# Patient Record
Sex: Female | Born: 1980 | Race: White | Hispanic: No | Marital: Married | Smoking: Former smoker
Health system: Southern US, Community
[De-identification: ages and names within clinical notes are randomized; demographics above are authoritative.]

## PROBLEM LIST (undated history)

## (undated) DIAGNOSIS — Z8619 Personal history of other infectious and parasitic diseases: Secondary | ICD-10-CM

## (undated) DIAGNOSIS — Z87442 Personal history of urinary calculi: Secondary | ICD-10-CM

## (undated) DIAGNOSIS — J45990 Exercise induced bronchospasm: Secondary | ICD-10-CM

## (undated) DIAGNOSIS — F988 Other specified behavioral and emotional disorders with onset usually occurring in childhood and adolescence: Secondary | ICD-10-CM

## (undated) DIAGNOSIS — I639 Cerebral infarction, unspecified: Secondary | ICD-10-CM

## (undated) DIAGNOSIS — Z3490 Encounter for supervision of normal pregnancy, unspecified, unspecified trimester: Secondary | ICD-10-CM

## (undated) DIAGNOSIS — Z8659 Personal history of other mental and behavioral disorders: Secondary | ICD-10-CM

## (undated) DIAGNOSIS — Z808 Family history of malignant neoplasm of other organs or systems: Secondary | ICD-10-CM

## (undated) DIAGNOSIS — Z8739 Personal history of other diseases of the musculoskeletal system and connective tissue: Secondary | ICD-10-CM

## (undated) DIAGNOSIS — Z8049 Family history of malignant neoplasm of other genital organs: Secondary | ICD-10-CM

## (undated) DIAGNOSIS — Z803 Family history of malignant neoplasm of breast: Secondary | ICD-10-CM

## (undated) DIAGNOSIS — F909 Attention-deficit hyperactivity disorder, unspecified type: Secondary | ICD-10-CM

## (undated) DIAGNOSIS — I69393 Ataxia following cerebral infarction: Secondary | ICD-10-CM

## (undated) DIAGNOSIS — R42 Dizziness and giddiness: Secondary | ICD-10-CM

## (undated) DIAGNOSIS — N200 Calculus of kidney: Secondary | ICD-10-CM

## (undated) DIAGNOSIS — F329 Major depressive disorder, single episode, unspecified: Secondary | ICD-10-CM

## (undated) DIAGNOSIS — Z98891 History of uterine scar from previous surgery: Secondary | ICD-10-CM

## (undated) DIAGNOSIS — Z9851 Tubal ligation status: Secondary | ICD-10-CM

## (undated) DIAGNOSIS — Z8052 Family history of malignant neoplasm of bladder: Secondary | ICD-10-CM

## (undated) HISTORY — DX: Family history of malignant neoplasm of breast: Z80.3

## (undated) HISTORY — PX: CARPAL TUNNEL RELEASE: SHX101

## (undated) HISTORY — DX: Exercise induced bronchospasm: J45.990

## (undated) HISTORY — PX: LAPAROSCOPIC CHOLECYSTECTOMY: SUR755

## (undated) HISTORY — PX: CYSTOSCOPY/URETEROSCOPY/HOLMIUM LASER/STENT PLACEMENT: SHX6546

## (undated) HISTORY — PX: CHOLECYSTECTOMY: SHX55

## (undated) HISTORY — PX: HAND SURGERY: SHX662

## (undated) HISTORY — PX: HERNIA REPAIR: SHX51

## (undated) HISTORY — PX: SPINAL FIXATION SURGERY W/ IMPLANT: SHX785

## (undated) HISTORY — PX: INGUINAL HERNIA REPAIR: SUR1180

## (undated) HISTORY — DX: Calculus of kidney: N20.0

## (undated) HISTORY — PX: KIDNEY STONE SURGERY: SHX686

## (undated) HISTORY — DX: Family history of malignant neoplasm of other organs or systems: Z80.8

## (undated) HISTORY — PX: BACK SURGERY: SHX140

## (undated) HISTORY — DX: Family history of malignant neoplasm of other genital organs: Z80.49

## (undated) HISTORY — DX: Other specified behavioral and emotional disorders with onset usually occurring in childhood and adolescence: F98.8

## (undated) HISTORY — DX: Cerebral infarction, unspecified: I63.9

## (undated) HISTORY — DX: Family history of malignant neoplasm of bladder: Z80.52

---
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
## 2002-11-20 ENCOUNTER — Other Ambulatory Visit: Admission: RE | Admit: 2002-11-20 | Discharge: 2002-11-20 | Payer: Self-pay | Admitting: Obstetrics and Gynecology

## 2003-06-05 ENCOUNTER — Other Ambulatory Visit: Admission: RE | Admit: 2003-06-05 | Discharge: 2003-06-05 | Payer: Self-pay | Admitting: Obstetrics and Gynecology

## 2003-12-08 ENCOUNTER — Inpatient Hospital Stay (HOSPITAL_COMMUNITY): Admission: AD | Admit: 2003-12-08 | Discharge: 2003-12-08 | Payer: Self-pay | Admitting: Obstetrics & Gynecology

## 2003-12-16 ENCOUNTER — Inpatient Hospital Stay (HOSPITAL_COMMUNITY): Admission: AD | Admit: 2003-12-16 | Discharge: 2003-12-20 | Payer: Self-pay | Admitting: *Deleted

## 2003-12-21 ENCOUNTER — Encounter: Admission: RE | Admit: 2003-12-21 | Discharge: 2004-01-20 | Payer: Self-pay | Admitting: Obstetrics and Gynecology

## 2004-01-24 ENCOUNTER — Other Ambulatory Visit: Admission: RE | Admit: 2004-01-24 | Discharge: 2004-01-24 | Payer: Self-pay | Admitting: Obstetrics and Gynecology

## 2006-09-11 ENCOUNTER — Inpatient Hospital Stay (HOSPITAL_COMMUNITY): Admission: AD | Admit: 2006-09-11 | Discharge: 2006-09-14 | Payer: Self-pay | Admitting: Obstetrics and Gynecology

## 2006-09-12 ENCOUNTER — Encounter (INDEPENDENT_AMBULATORY_CARE_PROVIDER_SITE_OTHER): Payer: Self-pay | Admitting: Specialist

## 2006-09-15 ENCOUNTER — Encounter: Admission: RE | Admit: 2006-09-15 | Discharge: 2006-10-15 | Payer: Self-pay | Admitting: Obstetrics and Gynecology

## 2006-09-26 ENCOUNTER — Ambulatory Visit: Payer: Self-pay | Admitting: Family Medicine

## 2007-04-21 ENCOUNTER — Ambulatory Visit: Payer: Self-pay | Admitting: Family Medicine

## 2007-04-24 ENCOUNTER — Encounter: Admission: RE | Admit: 2007-04-24 | Discharge: 2007-04-24 | Payer: Self-pay | Admitting: Family Medicine

## 2007-05-01 ENCOUNTER — Ambulatory Visit: Payer: Self-pay | Admitting: Family Medicine

## 2007-07-26 ENCOUNTER — Ambulatory Visit: Payer: Self-pay | Admitting: Family Medicine

## 2007-07-28 ENCOUNTER — Emergency Department (HOSPITAL_COMMUNITY): Admission: EM | Admit: 2007-07-28 | Discharge: 2007-07-28 | Payer: Self-pay | Admitting: Emergency Medicine

## 2007-08-01 ENCOUNTER — Ambulatory Visit (HOSPITAL_BASED_OUTPATIENT_CLINIC_OR_DEPARTMENT_OTHER): Admission: RE | Admit: 2007-08-01 | Discharge: 2007-08-01 | Payer: Self-pay | Admitting: Urology

## 2007-08-23 ENCOUNTER — Emergency Department (HOSPITAL_COMMUNITY): Admission: EM | Admit: 2007-08-23 | Discharge: 2007-08-23 | Payer: Self-pay | Admitting: Emergency Medicine

## 2007-10-23 ENCOUNTER — Ambulatory Visit (HOSPITAL_COMMUNITY): Admission: RE | Admit: 2007-10-23 | Discharge: 2007-10-23 | Payer: Self-pay | Admitting: Urology

## 2008-04-22 ENCOUNTER — Ambulatory Visit: Payer: Self-pay | Admitting: Family Medicine

## 2008-10-15 ENCOUNTER — Inpatient Hospital Stay (HOSPITAL_COMMUNITY): Admission: AD | Admit: 2008-10-15 | Discharge: 2008-10-15 | Payer: Self-pay | Admitting: Obstetrics & Gynecology

## 2008-12-02 ENCOUNTER — Ambulatory Visit: Payer: Self-pay | Admitting: Family Medicine

## 2009-01-06 ENCOUNTER — Inpatient Hospital Stay (HOSPITAL_COMMUNITY): Admission: RE | Admit: 2009-01-06 | Discharge: 2009-01-09 | Payer: Self-pay | Admitting: Obstetrics and Gynecology

## 2009-01-10 ENCOUNTER — Encounter: Admission: RE | Admit: 2009-01-10 | Discharge: 2009-02-06 | Payer: Self-pay | Admitting: Obstetrics and Gynecology

## 2009-02-05 ENCOUNTER — Ambulatory Visit: Payer: Self-pay | Admitting: Family Medicine

## 2009-02-07 ENCOUNTER — Encounter: Admission: RE | Admit: 2009-02-07 | Discharge: 2009-02-20 | Payer: Self-pay | Admitting: Obstetrics and Gynecology

## 2009-05-07 ENCOUNTER — Ambulatory Visit: Payer: Self-pay | Admitting: Family Medicine

## 2009-05-08 ENCOUNTER — Encounter: Admission: RE | Admit: 2009-05-08 | Discharge: 2009-05-08 | Payer: Self-pay | Admitting: Family Medicine

## 2009-05-08 ENCOUNTER — Ambulatory Visit: Payer: Self-pay | Admitting: Family Medicine

## 2009-05-20 ENCOUNTER — Ambulatory Visit: Payer: Self-pay | Admitting: Family Medicine

## 2009-06-03 ENCOUNTER — Ambulatory Visit: Payer: Self-pay | Admitting: Family Medicine

## 2009-06-30 ENCOUNTER — Ambulatory Visit: Payer: Self-pay | Admitting: Family Medicine

## 2009-07-15 ENCOUNTER — Ambulatory Visit (HOSPITAL_BASED_OUTPATIENT_CLINIC_OR_DEPARTMENT_OTHER): Admission: RE | Admit: 2009-07-15 | Discharge: 2009-07-15 | Payer: Self-pay | Admitting: General Surgery

## 2009-08-04 ENCOUNTER — Ambulatory Visit: Payer: Self-pay | Admitting: Family Medicine

## 2009-08-07 ENCOUNTER — Encounter: Admission: RE | Admit: 2009-08-07 | Discharge: 2009-08-07 | Payer: Self-pay | Admitting: Family Medicine

## 2009-08-15 ENCOUNTER — Ambulatory Visit (HOSPITAL_COMMUNITY): Admission: RE | Admit: 2009-08-15 | Discharge: 2009-08-15 | Payer: Self-pay | Admitting: Surgery

## 2009-08-20 ENCOUNTER — Encounter (INDEPENDENT_AMBULATORY_CARE_PROVIDER_SITE_OTHER): Payer: Self-pay | Admitting: General Surgery

## 2009-08-20 ENCOUNTER — Ambulatory Visit (HOSPITAL_COMMUNITY): Admission: RE | Admit: 2009-08-20 | Discharge: 2009-08-21 | Payer: Self-pay | Admitting: General Surgery

## 2009-08-30 ENCOUNTER — Inpatient Hospital Stay (HOSPITAL_COMMUNITY): Admission: AD | Admit: 2009-08-30 | Discharge: 2009-08-31 | Payer: Self-pay | Admitting: General Surgery

## 2009-09-18 ENCOUNTER — Ambulatory Visit: Payer: Self-pay | Admitting: Family Medicine

## 2009-09-29 ENCOUNTER — Ambulatory Visit: Payer: Self-pay | Admitting: Family Medicine

## 2009-10-20 ENCOUNTER — Ambulatory Visit: Payer: Self-pay | Admitting: Family Medicine

## 2009-10-28 ENCOUNTER — Ambulatory Visit: Payer: Self-pay | Admitting: Physician Assistant

## 2009-11-11 ENCOUNTER — Ambulatory Visit: Payer: Self-pay | Admitting: Family Medicine

## 2009-11-27 ENCOUNTER — Encounter: Admission: RE | Admit: 2009-11-27 | Discharge: 2009-11-27 | Payer: Self-pay | Admitting: Orthopaedic Surgery

## 2009-12-11 ENCOUNTER — Ambulatory Visit: Payer: Self-pay | Admitting: Family Medicine

## 2009-12-22 ENCOUNTER — Ambulatory Visit: Payer: Self-pay | Admitting: Family Medicine

## 2010-01-02 ENCOUNTER — Encounter: Admission: RE | Admit: 2010-01-02 | Discharge: 2010-01-02 | Payer: Self-pay | Admitting: Sports Medicine

## 2010-01-13 ENCOUNTER — Ambulatory Visit: Payer: Self-pay | Admitting: Family Medicine

## 2010-02-12 ENCOUNTER — Ambulatory Visit: Payer: Self-pay | Admitting: Family Medicine

## 2010-04-15 ENCOUNTER — Ambulatory Visit: Payer: Self-pay | Admitting: Family Medicine

## 2010-05-13 ENCOUNTER — Ambulatory Visit: Payer: Self-pay | Admitting: Physician Assistant

## 2010-05-15 ENCOUNTER — Ambulatory Visit: Payer: Self-pay | Admitting: Family Medicine

## 2010-05-21 ENCOUNTER — Ambulatory Visit: Payer: Self-pay | Admitting: Family Medicine

## 2010-06-16 ENCOUNTER — Ambulatory Visit: Payer: Self-pay | Admitting: Family Medicine

## 2010-07-31 ENCOUNTER — Ambulatory Visit: Payer: Self-pay | Admitting: Family Medicine

## 2010-07-31 ENCOUNTER — Encounter: Admission: RE | Admit: 2010-07-31 | Discharge: 2010-07-31 | Payer: Self-pay | Admitting: Family Medicine

## 2010-08-05 ENCOUNTER — Ambulatory Visit: Payer: Self-pay | Admitting: Family Medicine

## 2010-10-08 ENCOUNTER — Ambulatory Visit: Payer: Self-pay | Admitting: Family Medicine

## 2010-11-27 ENCOUNTER — Ambulatory Visit
Admission: RE | Admit: 2010-11-27 | Discharge: 2010-11-27 | Payer: Self-pay | Source: Home / Self Care | Attending: Family Medicine | Admitting: Family Medicine

## 2010-12-12 ENCOUNTER — Encounter: Payer: Self-pay | Admitting: Obstetrics and Gynecology

## 2010-12-13 ENCOUNTER — Encounter: Payer: Self-pay | Admitting: General Surgery

## 2010-12-14 ENCOUNTER — Encounter: Payer: Self-pay | Admitting: Family Medicine

## 2011-01-04 ENCOUNTER — Encounter (INDEPENDENT_AMBULATORY_CARE_PROVIDER_SITE_OTHER): Payer: 59 | Admitting: Family Medicine

## 2011-01-04 DIAGNOSIS — K5900 Constipation, unspecified: Secondary | ICD-10-CM

## 2011-01-04 DIAGNOSIS — J45909 Unspecified asthma, uncomplicated: Secondary | ICD-10-CM

## 2011-01-04 DIAGNOSIS — Z79899 Other long term (current) drug therapy: Secondary | ICD-10-CM

## 2011-01-04 DIAGNOSIS — F988 Other specified behavioral and emotional disorders with onset usually occurring in childhood and adolescence: Secondary | ICD-10-CM

## 2011-01-20 ENCOUNTER — Ambulatory Visit (INDEPENDENT_AMBULATORY_CARE_PROVIDER_SITE_OTHER): Payer: 59 | Admitting: Family Medicine

## 2011-01-20 DIAGNOSIS — G5600 Carpal tunnel syndrome, unspecified upper limb: Secondary | ICD-10-CM

## 2011-01-21 ENCOUNTER — Ambulatory Visit: Payer: 59 | Admitting: Family Medicine

## 2011-02-11 ENCOUNTER — Other Ambulatory Visit: Payer: Self-pay | Admitting: Family Medicine

## 2011-02-11 ENCOUNTER — Institutional Professional Consult (permissible substitution) (INDEPENDENT_AMBULATORY_CARE_PROVIDER_SITE_OTHER): Payer: 59 | Admitting: Family Medicine

## 2011-02-11 DIAGNOSIS — N949 Unspecified condition associated with female genital organs and menstrual cycle: Secondary | ICD-10-CM

## 2011-02-11 DIAGNOSIS — R102 Pelvic and perineal pain: Secondary | ICD-10-CM

## 2011-02-11 DIAGNOSIS — N760 Acute vaginitis: Secondary | ICD-10-CM

## 2011-02-12 ENCOUNTER — Ambulatory Visit
Admission: RE | Admit: 2011-02-12 | Discharge: 2011-02-12 | Disposition: A | Discharge status: Home / Self Care | Payer: 59 | Source: Ambulatory Visit | Attending: Family Medicine | Admitting: Family Medicine

## 2011-02-12 DIAGNOSIS — R102 Pelvic and perineal pain: Secondary | ICD-10-CM

## 2011-02-25 LAB — COMPREHENSIVE METABOLIC PANEL
ALT: 130 U/L — ABNORMAL HIGH (ref 0–35)
AST: 47 U/L — ABNORMAL HIGH (ref 0–37)
Albumin: 4 g/dL (ref 3.5–5.2)
Alkaline Phosphatase: 109 U/L (ref 39–117)
BUN: 15 mg/dL (ref 6–23)
CO2: 24 mEq/L (ref 19–32)
Calcium: 9.8 mg/dL (ref 8.4–10.5)
Chloride: 106 mEq/L (ref 96–112)
Creatinine, Ser: 0.63 mg/dL (ref 0.4–1.2)
GFR calc Af Amer: >60 mL/min (ref >60)
GFR calc non Af Amer: >60 mL/min (ref >60)
Glucose, Bld: 97 mg/dL (ref 70–99)
Potassium: 4.1 mEq/L (ref 3.5–5.1)
Sodium: 137 mEq/L (ref 135–145)
Total Bilirubin: 0.4 mg/dL (ref 0.3–1.2)
Total Protein: 7.3 g/dL (ref 6.0–8.3)

## 2011-02-25 LAB — DIFFERENTIAL
Basophils Absolute: 0.1 10*3/uL (ref 0.0–0.1)
Basophils Relative: 1 % (ref 0–1)
Eosinophils Absolute: 0.1 10*3/uL (ref 0.0–0.7)
Eosinophils Relative: 2 % (ref 0–5)
Lymphocytes Relative: 31 % (ref 12–46)
Lymphs Abs: 3 10*3/uL (ref 0.7–4.0)
Monocytes Absolute: 0.6 10*3/uL (ref 0.1–1.0)
Monocytes Relative: 6 % (ref 3–12)
Neutro Abs: 5.8 10*3/uL (ref 1.7–7.7)
Neutrophils Relative %: 60 % (ref 43–77)

## 2011-02-25 LAB — CBC
HCT: 41.6 % (ref 36.0–46.0)
Hemoglobin: 14 g/dL (ref 12.0–15.0)
MCHC: 33.7 g/dL (ref 30.0–36.0)
MCV: 93.5 fL (ref 78.0–100.0)
Platelets: 321 10*3/uL (ref 150–400)
RBC: 4.46 MIL/uL (ref 3.87–5.11)
RDW: 13.3 % (ref 11.5–15.5)
WBC: 9.6 10*3/uL (ref 4.0–10.5)

## 2011-02-25 LAB — AMYLASE: Amylase: 52 U/L (ref 27–131)

## 2011-02-26 LAB — CBC
HCT: 41.7 % (ref 36.0–46.0)
Hemoglobin: 14 g/dL (ref 12.0–15.0)
MCHC: 33.5 g/dL (ref 30.0–36.0)
MCV: 93.7 fL (ref 78.0–100.0)
Platelets: 288 10*3/uL (ref 150–400)
RBC: 4.46 MIL/uL (ref 3.87–5.11)
RDW: 14 % (ref 11.5–15.5)
WBC: 8.9 10*3/uL (ref 4.0–10.5)

## 2011-02-26 LAB — COMPREHENSIVE METABOLIC PANEL
ALT: 11 U/L (ref 0–35)
AST: 20 U/L (ref 0–37)
Albumin: 4.4 g/dL (ref 3.5–5.2)
Alkaline Phosphatase: 58 U/L (ref 39–117)
BUN: 10 mg/dL (ref 6–23)
CO2: 25 mEq/L (ref 19–32)
Calcium: 9.4 mg/dL (ref 8.4–10.5)
Chloride: 108 mEq/L (ref 96–112)
Creatinine, Ser: 0.63 mg/dL (ref 0.4–1.2)
GFR calc Af Amer: >60 mL/min (ref >60)
GFR calc non Af Amer: >60 mL/min (ref >60)
Glucose, Bld: 84 mg/dL (ref 70–99)
Potassium: 3.9 mEq/L (ref 3.5–5.1)
Sodium: 138 mEq/L (ref 135–145)
Total Bilirubin: 0.5 mg/dL (ref 0.3–1.2)
Total Protein: 7.6 g/dL (ref 6.0–8.3)

## 2011-02-26 LAB — DIFFERENTIAL
Basophils Absolute: 0 10*3/uL (ref 0.0–0.1)
Basophils Relative: 0 % (ref 0–1)
Eosinophils Absolute: 0.1 10*3/uL (ref 0.0–0.7)
Eosinophils Relative: 1 % (ref 0–5)
Lymphocytes Relative: 41 % (ref 12–46)
Lymphs Abs: 3.6 10*3/uL (ref 0.7–4.0)
Monocytes Absolute: 0.5 10*3/uL (ref 0.1–1.0)
Monocytes Relative: 5 % (ref 3–12)
Neutro Abs: 4.8 10*3/uL (ref 1.7–7.7)
Neutrophils Relative %: 53 % (ref 43–77)

## 2011-02-26 LAB — PREGNANCY, URINE: Preg Test, Ur: NEGATIVE

## 2011-02-27 LAB — POCT I-STAT, CHEM 8
BUN: 10 mg/dL (ref 6–23)
Calcium, Ion: 1.21 mmol/L (ref 1.12–1.32)
Chloride: 109 mEq/L (ref 96–112)
Creatinine, Ser: 0.6 mg/dL (ref 0.4–1.2)
Glucose, Bld: 82 mg/dL (ref 70–99)
HCT: 44 % (ref 36.0–46.0)
Hemoglobin: 15 g/dL (ref 12.0–15.0)
Potassium: 4.2 mEq/L (ref 3.5–5.1)
Sodium: 141 mEq/L (ref 135–145)
TCO2: 21 mmol/L (ref 0–100)

## 2011-02-27 LAB — CBC
HCT: 41.5 % (ref 36.0–46.0)
Hemoglobin: 13.9 g/dL (ref 12.0–15.0)
MCHC: 33.5 g/dL (ref 30.0–36.0)
MCV: 93.6 fL (ref 78.0–100.0)
Platelets: 268 10*3/uL (ref 150–400)
RBC: 4.44 MIL/uL (ref 3.87–5.11)
RDW: 13.8 % (ref 11.5–15.5)
WBC: 7.9 10*3/uL (ref 4.0–10.5)

## 2011-02-27 LAB — URINALYSIS, ROUTINE W REFLEX MICROSCOPIC
Bilirubin Urine: NEGATIVE
Glucose, UA: NEGATIVE mg/dL
Hgb urine dipstick: NEGATIVE
Ketones, ur: NEGATIVE mg/dL
Nitrite: NEGATIVE
Protein, ur: NEGATIVE mg/dL
Specific Gravity, Urine: 1.021 (ref 1.005–1.030)
Urobilinogen, UA: 0.2 mg/dL (ref 0.0–1.0)
pH: 6.5 (ref 5.0–8.0)

## 2011-02-27 LAB — DIFFERENTIAL
Basophils Absolute: 0 10*3/uL (ref 0.0–0.1)
Basophils Relative: 0 % (ref 0–1)
Eosinophils Absolute: 0.1 10*3/uL (ref 0.0–0.7)
Eosinophils Relative: 2 % (ref 0–5)
Lymphocytes Relative: 32 % (ref 12–46)
Lymphs Abs: 2.5 10*3/uL (ref 0.7–4.0)
Monocytes Absolute: 0.5 10*3/uL (ref 0.1–1.0)
Monocytes Relative: 7 % (ref 3–12)
Neutro Abs: 4.7 10*3/uL (ref 1.7–7.7)
Neutrophils Relative %: 59 % (ref 43–77)

## 2011-02-27 LAB — PREGNANCY, URINE: Preg Test, Ur: NEGATIVE

## 2011-03-09 LAB — CBC
HCT: 39.9 % (ref 36.0–46.0)
HCT: 33 % — ABNORMAL LOW (ref 36.0–46.0)
Hemoglobin: 13.5 g/dL (ref 12.0–15.0)
Hemoglobin: 11.2 g/dL — ABNORMAL LOW (ref 12.0–15.0)
MCHC: 33.8 g/dL (ref 30.0–36.0)
MCHC: 34.1 g/dL (ref 30.0–36.0)
MCV: 94.3 fL (ref 78.0–100.0)
MCV: 94.6 fL (ref 78.0–100.0)
Platelets: 198 10*3/uL (ref 150–400)
Platelets: 149 10*3/uL — ABNORMAL LOW (ref 150–400)
RBC: 4.23 MIL/uL (ref 3.87–5.11)
RBC: 3.49 MIL/uL — ABNORMAL LOW (ref 3.87–5.11)
RDW: 14.6 % (ref 11.5–15.5)
RDW: 14.6 % (ref 11.5–15.5)
WBC: 13.9 10*3/uL — ABNORMAL HIGH (ref 4.0–10.5)
WBC: 17.2 10*3/uL — ABNORMAL HIGH (ref 4.0–10.5)

## 2011-03-09 LAB — CCBB MATERNAL DONOR DRAW

## 2011-03-09 LAB — RPR: RPR Ser Ql: NONREACTIVE

## 2011-04-06 NOTE — Op Note (Signed)
[name], [name]                ACCOUNT NO.:  [id number]   MEDICAL RECORD NO.:  [id number]          PATIENT TYPE:  INP   LOCATION:  9128                          FACILITY:  [facility]   PHYSICIAN:  Maxie Better, M.D.DATE OF BIRTH:  08-Jan-1981   DATE OF PROCEDURE:  01/06/2009  DATE OF DISCHARGE:                               OPERATIVE REPORT   PREOPERATIVE DIAGNOSES:  Previous cesarean section x2, term gestation.   PROCEDURE:  Repeat cesarean section, Kerr hysterotomy.   POSTOPERATIVE DIAGNOSES:  Previous cesarean section x2, term gestation.   ANESTHESIA:  Spinal.   SURGEON:  Maxie Better, MD   ASSISTANT:  Marlinda Mike, CNM   PROCEDURE:  Under adequate spinal anesthesia, the patient was placed in  the supine position with a left lateral tilt.  She was sterilely prepped  and draped in usual fashion.  An indwelling Foley catheter was sterilely  placed.  Marcaine 0.25% was injected along the previous Pfannenstiel  skin incision.  A Pfannenstiel skin incision was then made, carried down  to the rectus fascia.  The rectus fascia was then opened transversely.  At that point, the parietal peritoneum was also entered in the midline  as the rectus muscle was widely separated.  With that in mind and  evidence that there was no injury to the bladder, the remaining parietal  peritoneum was opened after the rectus fascia was then sharply dissected  off the rectus superiorly and inferiorly.  Small amount of bladder  adhesions was encountered.  The vesicouterine peritoneum was opened  transversely.  The bladder was then gently bluntly dissected off the  lower uterine segment.  A curvilinear low transverse uterine incision  was then made and extended with bandage scissors.  Artificial rupture of  membranes was performed.  Clear copious amniotic fluid was noted.  Subsequent delivery of a live female was accomplished.  Baby was bulb  suctioned.  The abdomen and cord was clamped and cut.   The baby was  transferred to the awaiting pediatrician who assigned Apgars of 9 and 9  at 1 and 5 minutes.  The placenta was manually removed.  Uterine cavity  was cleaned of debris.  Uterine incision had no extension.  It was  closed in 2 layers, the first layer with 0 Monocryl running locked  stitch, the second layer was imbricated using 0 Monocryl suture.  Normal  tubes and ovaries were noted bilaterally.  The abdomen was then  copiously irrigated and suctioned of debris with good hemostasis noted.  It was then inspected.  The peritoneum was folded on itself bilaterally  and this was then separated allowing for parietal peritoneum to be  closed with 2-0 Vicryl.  The rectus fascia was closed with 0 Vicryl x2.  The subcutaneous area was irrigated, small bleeders cauterized,  interrupted 2-0 plain sutures placed in the skin approximated using  Ethicon staples.   SPECIMENS:  Placenta not sent to Pathology.   ESTIMATED BLOOD LOSS:  700 mL.   INTRAOPERATIVE FLUID:  1900 mL crystalloid.   URINE OUTPUT:  400 mL clear yellow urine.   Sponge and  instrument counts x2 was correct.  Complication was none.  Weight of the baby was 9 pounds 2 ounces.  The patient tolerated the  procedure well and was transferred to recovery room in stable condition.      Maxie Better, M.D.  Electronically Signed     [place]/MEDQ  D:  01/06/2009  T:  01/07/2009  Job:  [id number]

## 2011-04-06 NOTE — Discharge Summary (Signed)
[name], [name]                ACCOUNT NO.:  [id number]   MEDICAL RECORD NO.:  [id number]          PATIENT TYPE:  INP   LOCATION:  9128                          FACILITY:  [facility]   PHYSICIAN:  Maxie Better, M.D.DATE OF BIRTH:  09/04/1981   DATE OF ADMISSION:  01/06/2009  DATE OF DISCHARGE:  01/09/2009                               DISCHARGE SUMMARY   ADMISSION DIAGNOSES:  Previous cesarean section, term gestation.   DISCHARGE DIAGNOSES:  Term gestation delivered, previous cesarean  section.   PROCEDURE:  Repeat cesarean section.   HISTORY OF PRESENT ILLNESS:  A 27-year gravida 3, para 2-0-0-2 female at  term with previous cesarean section x2 admitted for a repeat cesarean  section.   HOSPITAL COURSE:  The patient was admitted.  She underwent a repeat  cesarean section.  The patient delivered a 9-pound-2 ounce live female.  Apgars of 9 and 9.  Normal tubes and ovaries were noted at the time.  Postoperative course uncomplicated.  Blood type is O+, rubella was  immune.  CBC on postpartum day #1 showed a hemoglobin 11.2, hematocrit  of 33, white count of 17.2, platelet count of 149,000.  By postpartum  day #3, the patient desired to go home.  Her incision had a little bit  of erythema.  The patient's incision was consistent with adhesive  reaction, but otherwise was normal.  She was deemed well to be  discharged home.   DISPOSITION:  Home.   CONDITION:  Stable.   DISCHARGE MEDICATIONS:  1. Percocet 1-2 tablets every 4 hours p.r.n. pain.  2. Motrin 600 mg every 6 hours p.r.n. pain.  3. Prenatal vitamins one p.o. daily.  4. Apply cortisone cream to the area of the abdomen 3-4 times a day as      needed.   DISCHARGE INSTRUCTIONS:  Per the postpartum booklet given, followup  appointment for staple removal on January 13, 2009, and for 6 weeks  postpartum.      Maxie Better, M.D.  Electronically Signed     [place]/MEDQ  D:  02/02/2009  T:  02/03/2009  Job:   [id number]

## 2011-04-06 NOTE — Op Note (Signed)
[name], [name]                ACCOUNT NO.:  [id number]   MEDICAL RECORD NO.:  [id number]          PATIENT TYPE:  AMB   LOCATION:  DSC                          FACILITY:  [facility]   PHYSICIAN:  Almond Lint, MD       DATE OF BIRTH:  05/14/1981   DATE OF PROCEDURE:  07/15/2009  DATE OF DISCHARGE:                               OPERATIVE REPORT   PREOPERATIVE DIAGNOSIS:  Right inguinal hernia.   POSTOPERATIVE DIAGNOSIS:  Right inguinal hernia.   PROCEDURE PERFORMED:  Right inguinal hernia repair with mesh.   SURGEON:  Almond Lint, MD   ASSISTANT:  None.   ANESTHESIA:  General and local.   FINDINGS:  Direct right inguinal hernia.   SPECIMENS:  None.   ESTIMATED BLOOD LOSS:  Minimal.   COMPLICATIONS:  None known.   PROCEDURE:  Steinhaus was identified in the holding area and taken to the  operating room where she was placed supine on the operating room table.  General anesthesia was induced.  Time-out was performed according to the  surgical safety checklist.  Her right groin was shaved, prepped, and  draped in sterile fashion.  When all was correct on the surgical safety  checklist, we continued the operation.  The anterior-superior iliac  spine was identified and marked, and the pubis was marked in the  midline.  An obliquely oriented incision approximately two to 2-1/2  inches in length was made toward the midline.  The incision was  infiltrated with local anesthetic.  The skin was divided with a #15  blade.  The subcutaneous tissue was divided with the Bovie.  There was  some scar tissue as the incision was made with the C-section incision.  Once the external oblique was encountered, this was cleaned off bluntly  with a Kitner.  There were many small crossing veins and these were  coagulated.  The external ring was identified.  A nick was made in the  external oblique fascia with a #15 blade.  The Metzenbaum scissors were  used to elevate the fascia and cut the fascia  inferomedially toward the  external ring, opening it up.  The fascia was also opened laterally.  There was a direct inguinal hernia seen at this point.  The round  ligament was identified and elevated.  There was a small lipoma here as  well as a small hernia sac.  The hernia sac was reduced off the cord,  however, it was very friable.  This was not able to be closed with the  suture.  The round ligament was divided such that I could retract back  into the abdomen.  There was a bridging vein that was tied off.  The  inferior border of the external oblique fascia was cleaned off with a  Kitner.  The Bard 2 x 6 inch mesh was cut to the appropriate size.  A 2-  0 running Prolene was used to secure the inferior border of the mesh to  the inferior shelving edge.  The superior border was taken in  interrupted stitches taking care to cover the  direct hernia.  Also, care  was taken to stretch out the mesh.  The lateral aspect of the mesh was  then trimmed and tucked underneath the external oblique fascia.  The  external oblique fascia was then closed over the mesh with a 2-0 Vicryl.  The area under the fascia was infiltrated with local as well as the  subcutaneous tissue.  The Scarpa fascia was closed with a running 2-0  Vicryl suture.  The skin was reapproximated with 3-0 Vicryl deep dermal  sutures and 4-0 Monocryl running subcuticular suture.  The wound was  cleaned, dried, and dressed with Dermabond.  The patient was awakened  from anesthesia and taken to PACU in stable condition.      Almond Lint, MD  Electronically Signed     FB/MEDQ  D:  07/15/2009  T:  07/16/2009  Job:  [id number]

## 2011-04-06 NOTE — Op Note (Signed)
NAME:  [name], [name]                ACCOUNT NO.:  [id number]   MEDICAL RECORD NO.:  [id number]          PATIENT TYPE:  AMB   LOCATION:  NESC                         FACILITY:  [facility]   PHYSICIAN:  Sigmund I. Patsi Sears, M.D.DATE OF BIRTH:  01/22/1981   DATE OF PROCEDURE:  08/01/2007  DATE OF DISCHARGE:                               OPERATIVE REPORT   PREOPERATIVE DIAGNOSIS:  Distal right ureteral stone.   POSTOPERATIVE DIAGNOSIS:  Distal right ureteral stone.   PROCEDURE:  Cystoscopy, right ureteroscopy with laser lithotripsy and  stone manipulation, right retrograde pyelography with interpretation,  right ureteral stent placement.   SURGEON:  Dr. Jethro Bolus.   ASSISTANT:  Melina Schools.   ANESTHESIA:  General.   DRAINS:  6-French 26 double-J ureteral stent, right ureter.   SPECIMENS:  Stone fragments.   FINDINGS:  Right retrograde pyelogram showing significant right-sided  hydro with a good repair redundant tortuous ureter.  Distal ureteral  edema with areas of patchy ischemic appearing ureter.   INDICATIONS FOR PROCEDURE:  [name] is a 30 year old female who was  evaluated for significant right-sided colic.  Helical CT scan of the  abdomen and pelvis without contrast revealed an impressive 1-cm distal  stone with significant hydroureter nephrosis.  This is the patient's  first stone event.  Due to the size and her symptoms, as well as the  location of the stone, ureteroscopy was selected as her definitive stone  management.   DESCRIPTION OF PROCEDURE IN DETAIL:  The patient was brought to the  operating room. She was identified by arm band. Informed consent was  verified, and preoperative time-out was performed.  After the successful  induction of general anesthesia with laryngeal mask airway, the patient  was moved to the dorsal lithotomy position.  Perioperative antibiotics  were administered.  All appropriate pressure points were padded to avoid  neurapraxia  compartment syndrome.  Sequential compression devices were  employed.  The perineum was prepped and draped in usual fashion.  A 22-  French cystoscopic sheath was used to introduce a 12-degree cystoscopic  lens transurethrally in the bladder.  Pancystourethroscopy was  performed.  There was some scattered squamous metaplasia along the  trigone.  The left ureteral orifice was noted to be normal in anatomic  position along a trigone and was seen to freely efflux urine.  The right  ureteral orifice was noted in normal anatomic position, but no efflux of  urine was noted.  The remainder of the bladder was inspected and was  free of any erythema, papillary lesions, foreign bodies, diverticula,  stones.  Attention was turned to the right ureteral orifice.  It was  cannulated with a 6-French end-hole catheter.  An angled 0.38 Glidewire  was inserted and advanced into the presumptive renal pelvis via the  Seldinger technique.  The end-hole catheter and the cystoscope were  removed, and a 6-French short semi-rigid ureteroscope was driven under  direct vision into the distal ureter.  The intramural or intramural  ureter was normal.  Upon entering the distal ureter, there was marked  edema consistent with stone impaction.  A 1-cm stone was visualized,  both with fluoroscopy and under direct vision.  At this time, a 270  micron holmium laser fiber was inserted via the working part of the  scope.  Systematic holmium laser lithotripsy was undertaken.  With  settings of 0.5 joules at 5 Hz for a total power of 2.5 watts, the stone  was systematically fragmented into 7-mm pieces.  Once this was done, the  scope was driven into the mid ureter.  No additional areas of stone  disease were seen.  There was markedly abnormal distal ureter with areas  that appeared white and possibly ischemic.  At this time, a nitinol  basket was used to retrieve 2 fragments that were greater than 1 mm.  The semi-rigid scope was  then drive to the level of the ureteropelvic  junction, and then the ureter was inspected in a retrograde fashion.  No  additional stones were noted.  The semirigid scope was removed.  A 6-  French end-hole catheter was advanced by the Seldinger technique over  the wire into the mid ureter.  Contrast was injected, and right  retrograde pyelogram was performed.   Right retrograde pyelogram revealed a markedly dilated ureter with  severe hydronephrosis.  The ureter was tortuous particularly at the  upper aspect.  No additional filling defects were seen.   At this time, a sensor wire was advanced into the renal pelvis under  fluoroscopic control.  Because of the tortuosity of the ureter, this was  technically challenging and required the patient to be in steep  Trendelenburg and significant manipulation with both an end-hole  catheter and an angiographic catheter.  Once the wire was constantly  seen within the renal pelvis, the cystoscope was back loaded with a  wire, and a 6 x 26 double-J ureteral stent was advanced into the renal  pelvis under fluoroscopic control.  Once seen within the renal pelvis,  the wire was removed.  Excellent proximal and distal curls were seen  under fluoroscopy, and the distal curl was confirmed under direct  vision.  The bladder was drained.  The stone fragments were retrieved  and sent with the patient.  At this time, the procedure was terminated.  The patient tolerated the procedure well, and there were no  complications.  Sigmund Patsi Sears was the primary responsible physician  and was present and participated in all aspects of procedure.   DISPOSITION:  The patient was extubated uneventfully and transferred  safely to post-anesthesia care unit.  She will be seen in 2 weeks for  stent removal.  She is given a prescription for Percocet #20 and  __________  #35.   Dictated by Melina Schools.      Terie Purser, MD      Sigmund I. Patsi Sears, M.D.   Electronically Signed    [facility]/MEDQ  D:  08/01/2007  T:  08/01/2007  Job:  [id number]

## 2011-04-09 NOTE — Op Note (Signed)
NAME:  [name], [name]                          ACCOUNT NO.:  [id number]   MEDICAL RECORD NO.:  [id number]                   PATIENT TYPE:  INP   LOCATION:  9125                                 FACILITY:  [facility]   PHYSICIAN:  Richardean Sale, M.D.                DATE OF BIRTH:  [birth year]   DATE OF PROCEDURE:  12/17/2003  DATE OF DISCHARGE:                                 OPERATIVE REPORT   PREOPERATIVE DIAGNOSES:  1. Thirty-seven week pregnancy with induction for elevated blood pressures.  2. Arrest of dilation.  3. Arrest of active phase.   POSTOPERATIVE DIAGNOSES:  1. Thirty-seven week pregnancy with induction for elevated blood pressures.  2. Arrest of dilation.  3. Arrest of active phase.   PROCEDURE:  Primary low transverse cesarean section.   SURGEON:  Richardean Sale, M.D.   ASSISTANT:  Conni Elliot, M.D.   ANESTHESIA:  Epidural.   COMPLICATIONS:  None.   ESTIMATED BLOOD LOSS:  700 mL.   URINE OUTPUT:  400 mL clear.   FLUIDS REPLACED:  1000 mL.   FINDINGS:  A viable female infant in cephalic presentation with Apgars of 9  and 9, occiput posterior position with the right hand by the face as well as  the cord looped around that hand.  Intact placenta with three-vessel cord.  Clear amniotic fluid.  Normal adnexa.  Arterial cord pH greater than 7.4.   DESCRIPTION OF PROCEDURE:  The patient was taken to the operating room,  where she was prepped and draped in the usual sterile fashion and placed in  the supine position with a leftward tilt.  A Foley catheter had already been  placed. Her epidural was tested and found to be adequate, and a Pfannenstiel  skin incision was made with a scalpel.  This was carried down sharply to the  fascia.  The fascia was then incised in the midline and the incision was  extended laterally using the __________ scissors.  The superior and inferior  aspect of the fascial incision were grasped with Kocher clamps, elevated,  and the  underlying rectus muscle separated off with Mayo scissors.  The  muscle was then separated in the midline.  The peritoneum was identified,  grasped between two hemostats, and entered sharply with Metzenbaum scissors.  This incision was then extended superiorly and inferiorly with good  visualization of the bladder.  The bladder blade was then inserted and the  vesicouterine peritoneum was grasped with a pickup and entered sharply with  the Metzenbaum scissors.  This incision was then extended laterally and a  bladder flap was created digitally.   The bladder blade was then reinserted and the lower uterine segment was  incised in a transverse fashion with the scalpel.  This incision was then  extended manually.  The bladder blade was removed.  The infant's head was  delivered atraumatically.  The nose and mouth were  suctioned with the bulb  and the infant was delivered through the abdominal incision without  difficulty.  The cord was clamped and cut.  The infant was carried off to  the waiting NICU attendants.  Cord gases and cord blood were obtained.  The  placenta was then removed spontaneously.  The uterus was cleared of all clot  and debris, and the uterine incision was repaired with 1-0 chromic in a  running locked fashion.  A second layer of the same suture was placed in an  imbricating fashion to facilitate hemostasis and in the event the patient  desired an attempted vaginal birth after cesarean section.  Once hemostasis  was confirmed the adnexa were palpated and were normal.  The pelvis was then  irrigated copiously with warm normal saline and any areas of bleeding were  cauterized with Bovie.  The uterine incision was then carefully inspected  one last time and was hemostatic.  The peritoneum was then inspected and any  areas of bleeding were cauterized with Bovie.  The subfascial areas were  inspected and were hemostatic.  The rectus muscles were examined and were  hemostatic.   At this point the fascia was then reapproximated with a running  Vicryl suture and the skin was closed with staples.   The patient tolerated the procedure well.  Sponge, lap, needle, and  instrument counts were all correct x2.  She was taken to the recovery room  and awakened in stable condition.  The infant is doing well at the time of  this dictation.                                               Richardean Sale, M.D.    [facility]/MEDQ  D:  12/17/2003  T:  12/18/2003  Job:  [id number]

## 2011-04-09 NOTE — Op Note (Signed)
[name], [name]                ACCOUNT NO.:  [id number]   MEDICAL RECORD NO.:  [id number]          PATIENT TYPE:  INP   LOCATION:  NA                            FACILITY:  [facility]   PHYSICIAN:  Maxie Better, M.D.DATE OF BIRTH:  01/21/1981   DATE OF PROCEDURE:  09/11/2006  DATE OF DISCHARGE:                                 OPERATIVE REPORT   PREOPERATIVE DIAGNOSIS:  Previous cesarean section, early labor.   PROCEDURE:  Repeat cesarean section, paratubal cyst removal.   DIAGNOSIS:  Previous cesarean section, early labor, paratubal cyst.   ANESTHESIA:  Spinal.   SURGEON:  Maxie Better, M.D.   ASSISTANT:  Lenoard Aden, M.D.   PROCEDURE:  Under adequate spinal anesthesia, the patient is placed in the  supine position with a left lateral tilt.  She was sterilely prepped and  draped in usual fashion.  An indwelling Foley catheter was sterilely placed.  10 mL of quarter percent Marcaine was injected along the previous  Pfannenstiel skin incision.  Pfannenstiel skin incision was then made,  carried down to the rectus fascia.  The rectus fascia was opened  transversely and on grasping the rectus fascia in the midline to bluntly and  sharply dissect it off the rectus muscle, was noted that there was  essentially very little rectus muscle noted bilaterally.  The pyramidalis  was noted inferiorly.  The careful opening of the midline was then  accomplished and extended superiorly and inferiorly.  The bladder was  adherent to the lower uterine segment and the parietal peritoneum had been  entered and extended.  The bladder was then carefully dissected off the  lower uterine segment.  Low transverse uterine incision was then made and  extended with bandage scissors.  Artificial rupture of membranes and was  done, clear fluid noted.  Subsequent delivery of a live female with  assistance of vacuum was accomplished.  The baby was bulb suctioned on the  abdomen and the baby was  delivered.  Cord was clamped, the baby was  transferred to the awaiting pediatricians who assigned Apgars of  9 and 10  at one and five minutes.  The placenta was manually removed.  The uterine  cavity was cleaned of debris.  Uterine incision had no extension.  Reinspection of the area of the bladder then revealed that was mainly marked  adhesions along the area of the bladder which was inferiorly displaced.  The  uterine incision was then closed in two layers, the first layer 0 Monocryl  running locked stitch.  Second layer was imbricated using 0 Monocryl suture.  The cautery was used for hemostasis that area.  It was then noted that there  was a paratubal cyst that was entwined with the omentum and tracing it back  from its original location was noted to be coming from the right tube.  The  stalk of the paratubal cyst on the right was then cauterized and then the  cyst with portion of omentum was then removed.  The tube and ovary on the  right was otherwise normal.  Inspection of the  left fallopian tube noted  that it also had a paratubal cyst and that was removed.  The left tube and  ovary was also normal.  With good hemostasis noted, copious irrigation was  then performed, debris and suctioned removed of fluids.  Parietal peritoneum  was not closed so as not to bring up further adhesions in the midline.  The  rectus fascia was then closed with 0 Vicryl x2.  The subcutaneous areas  approximated with interrupted 2-0 plain sutures after the area was  irrigated, suctioned and small bleeders cauterized.  The skin approximated  using Ethicon staples.  Specimen was placenta not sent, paratubal cyst sent  to pathology.  Estimated blood loss was 500 mL.  Intraoperative fluid was  3500 mL crystalloid.  Urine output was 1000 mL clear  yellow urine.  Sponge and instrument counts x2 was correct.  Complication  was none.  The patient tolerated the procedure well.  Weight of the baby was  7 pounds 7  ounces.  The patient was transferred to recovery room stable  condition.      Maxie Better, M.D.  Electronically Signed     [place]/MEDQ  D:  09/11/2006  T:  09/12/2006  Job:  [id number]

## 2011-04-09 NOTE — Discharge Summary (Signed)
NAME:  [name], [name]                          ACCOUNT NO.:  [id number]   MEDICAL RECORD NO.:  [id number]                   PATIENT TYPE:  INP   LOCATION:  9125                                 FACILITY:  [facility]   PHYSICIAN:  Richardean Sale, M.D.                DATE OF BIRTH:  1981-04-23   DATE OF ADMISSION:  12/16/2003  DATE OF DISCHARGE:  12/20/2003                                 DISCHARGE SUMMARY   ADMITTING DIAGNOSIS:  Thirty-seven-week pregnancy with elevated blood  pressures, admitted for induction of labor.   DISCHARGE DIAGNOSES:  1. Thirty-seven-week pregnancy with elevated blood pressures, admitted for     induction of labor.  2. Status post cesarean section.   PROCEDURE:  Status post primary low transverse cesarean section performed on  December 17, 2003.   HOSPITAL COURSE AND HISTORY OF PRESENT ILLNESS:  This is a 30 year old  gravida 1 para 0 female who presented at 37+ weeks gestation with elevated  blood pressures up to 130/100.  The patient was evaluated for preeclampsia.  Preeclampsia labs were all within normal limits.  There was negative  proteinuria.  However, the patient's blood pressures remained elevated.  She  was admitted for induction of labor with cervical ripening with Cervidil.  Known group B beta strep positive, was given penicillin at the start of  induction of labor.  The patient was monitored with internal monitoring with  an IUPC and FSE after amniotomy was performed.  Pitocin was given and  despite adequate labor per the IUPC the patient failed to dilate past 5 cm.  The patient was therefore counseled on the need for cesarean section due to  failure to dilate.  The risks of the procedure were reviewed with the  patient and informed consent was obtained.  The patient underwent an  uncomplicated primary low transverse cesarean section on December 17, 2003  which resulted in the delivery of a viable female infant with Apgars of 9 and  9.  Arterial cord pH  was greater than 7.4.  The patient's postoperative  course was relatively unremarkable.  She was monitored with serial blood  pressures which returned to normal shortly after delivery.  There were no  signs or symptoms of preeclampsia throughout her hospital stay.  She did  have some discomfort in her lower back secondary to previous back surgery  with Harrington rods placed but this resolved prior to her discharge.  The  patient did develop a urinary tract infection while she was hospitalized.  She was subsequently placed on Macrobid at the time of discharge.  On  postoperative day  #3 the patient was afebrile, she was voiding and ambulating without  difficulty, passing flatus, tolerating a regular diet, and her pain was well  controlled with oral pain medications.  Her blood pressures had returned to  normal.  The patient was subsequently discharged to home with routine  postpartum instructions  and instructions to continue her Macrobid given her  history of recurrent UTIs.   LABORATORY EVALUATION:  The patient's admit hemoglobin was 14.1,  postoperative day #1 hemoglobin was 11.3.  Platelet count was 178,000.  Preeclampsia labs were all within normal limits.  RPR was nonreactive.   DISPOSITION:  To home.   CONDITION:  Stable.   INSTRUCTIONS:  The patient was instructed to continue her Macrobid daily,  given her history of frequent UTIs.  She was also instructed to return to  the office for any headaches, vision changes, or epigastric pain.  Routine  postpartum and postoperative instructions were reviewed with the patient and  she will return to the office in 4-6 weeks for a routine postoperative  visit.   MEDICATIONS:  1. Tylox one to two tablets p.o. q.4-6h. p.r.n. pain.  2. Macrobid 100 mg p.o. daily.                                               Richardean Sale, M.D.    JW/MEDQ  D:  02/04/2004  T:  02/06/2004  Job:  [id number]

## 2011-04-09 NOTE — Discharge Summary (Signed)
[name], [name]                ACCOUNT NO.:  [id number]   MEDICAL RECORD NO.:  [id number]          PATIENT TYPE:  INP   LOCATION:  9124                          FACILITY:  [facility]   PHYSICIAN:  Maxie Better, M.D.DATE OF BIRTH:  1980-12-12   DATE OF ADMISSION:  09/11/2006  DATE OF DISCHARGE:  09/14/2006                               DISCHARGE SUMMARY   ADMISSION DIAGNOSES:  1. Term gestation.  2. Previous cesarean section.  3. Early labor.   DISCHARGE DIAGNOSES:  1. Term gestation, delivered.  2. Previous cesarean section.  3. Early labor.  4. Status post repeat cesarean section.  5. Bilateral paratubal cysts.   PROCEDURES:  Repeat cesarean section, removal of paratubal cysts.   HOSPITAL COURSE:  The patient was admitted to [facility] as a 30-  year-old gravida 2, para 1-0-0-1 female at 8 weeks with a previous  cesarean section who presented in early labor and desired a repeat  cesarean section. The patient was taken to the operating room where she  underwent repeat cesarean section. The procedure resulted in delivery of  a 7 pounds 7 ounces live female, Apgars of 9 and 10. The placenta was  spontaneous, not sent. The patient had an uncomplicated postoperative  course. Her CBC on postop day #1 showed a hemoglobin of 11.5, hematocrit  of 32.6, white count 16.6 and platelet count of 164,000. By postop day  #3 the patient remained afebrile, was tolerating a regular diet.  Incision had no evidence of infection. She was deemed well to be  discharged home.   DISPOSITION:  Home.   CONDITION ON DISCHARGE:  Stable.   DISCHARGE MEDICATIONS:  1. Ibuprofen 800 mg every 8 hours p.r.n. pain  2. Percocet 1-2 tablets every 6 hours p.r.n. pain.  3. Prenatal vitamins 1 p.o. daily.   FOLLOWUP:  Appointment at 6 weeks with [facility].   DISCHARGE INSTRUCTIONS:  Per the postpartum booklet given.      Maxie Better, M.D.  Electronically Signed     [place]/MEDQ  D:   10/16/2006  T:  10/16/2006  Job:  [id number]

## 2011-05-04 ENCOUNTER — Other Ambulatory Visit: Payer: Self-pay

## 2011-05-04 ENCOUNTER — Telehealth: Payer: Self-pay | Admitting: Family Medicine

## 2011-05-04 MED ORDER — SERTRALINE HCL 100 MG PO TABS: 100.0000 mg | ORAL_TABLET | Freq: Every day | ORAL | Status: DC

## 2011-05-04 NOTE — Telephone Encounter (Signed)
Talked with husband and informed about meds

## 2011-05-04 NOTE — Telephone Encounter (Signed)
Pt has been taking Zoloft 100mg  qd because the 50mg  wasn't working well enough.  Needs Rx Zoloft 100 mg called to [facility] [address]  Also needs Adderral 3 months written.  Please call when ready. Thanks

## 2011-05-28 ENCOUNTER — Telehealth: Payer: Self-pay | Admitting: Family Medicine

## 2011-05-28 ENCOUNTER — Other Ambulatory Visit: Payer: Self-pay | Admitting: Family Medicine

## 2011-05-28 MED ORDER — LISDEXAMFETAMINE DIMESYLATE 60 MG PO CAPS: 60.0000 mg | ORAL_CAPSULE | ORAL | Status: DC

## 2011-05-28 MED ORDER — AMPHETAMINE-DEXTROAMPHETAMINE 10 MG PO TABS: 10.0000 mg | ORAL_TABLET | Freq: Every day | ORAL | Status: DC

## 2011-05-28 NOTE — Telephone Encounter (Signed)
PT WILL PICK UP THIS PM.  Closed previous encounter by error.  Please see previous.

## 2011-05-28 NOTE — Telephone Encounter (Signed)
Pt needs refill on Vyvanse 60 mg 1 qd and Adderall 10 mg 1 qd out of rx tomorrow.

## 2011-07-28 ENCOUNTER — Telehealth: Payer: Self-pay | Admitting: Family Medicine

## 2011-07-28 NOTE — Telephone Encounter (Signed)
Pharmacist called and questioned refill date on the Vyvanse not sure if 9/7 or 9/2.  Last time refilled was on the 5th and per that date it has been 30 days.  I advised if 30 days up then go ahead and refill and we will keep a watch on it.

## 2011-08-24 LAB — URINALYSIS, ROUTINE W REFLEX MICROSCOPIC
Bilirubin Urine: NEGATIVE
Glucose, UA: NEGATIVE
Hgb urine dipstick: NEGATIVE
Ketones, ur: NEGATIVE
Nitrite: NEGATIVE
Protein, ur: NEGATIVE
Specific Gravity, Urine: <1.005 — ABNORMAL LOW
Urobilinogen, UA: 0.2
pH: 7

## 2011-08-30 ENCOUNTER — Telehealth: Payer: Self-pay | Admitting: Family Medicine

## 2011-08-30 LAB — PREGNANCY, URINE: Preg Test, Ur: NEGATIVE

## 2011-08-30 MED ORDER — LISDEXAMFETAMINE DIMESYLATE 60 MG PO CAPS: 60.0000 mg | ORAL_CAPSULE | ORAL | Status: DC

## 2011-08-30 MED ORDER — AMPHETAMINE-DEXTROAMPHETAMINE 10 MG PO TABS: 10.0000 mg | ORAL_TABLET | Freq: Every day | ORAL | Status: DC

## 2011-08-30 NOTE — Telephone Encounter (Signed)
Vyvanse and Adderall were renewed

## 2011-08-30 NOTE — Telephone Encounter (Signed)
Pt needs refill for Vyvanse 60 mg qd & Adderall 10mg   She is out and would like to pick up before lunch.

## 2011-09-03 LAB — URINALYSIS, ROUTINE W REFLEX MICROSCOPIC
Bilirubin Urine: NEGATIVE
Glucose, UA: NEGATIVE
Hgb urine dipstick: NEGATIVE
Ketones, ur: NEGATIVE
Nitrite: NEGATIVE
Protein, ur: NEGATIVE
Specific Gravity, Urine: 1.014
Urobilinogen, UA: 0.2
pH: 7

## 2011-09-03 LAB — PREGNANCY, URINE: Preg Test, Ur: NEGATIVE

## 2011-09-03 LAB — POCT HEMOGLOBIN-HEMACUE
Hemoglobin: 14.6
Operator id: 114531

## 2011-10-28 ENCOUNTER — Telehealth: Payer: Self-pay | Admitting: Family Medicine

## 2011-10-28 NOTE — Telephone Encounter (Signed)
DONE

## 2011-11-26 ENCOUNTER — Encounter: Payer: Self-pay | Admitting: Family Medicine

## 2011-11-26 ENCOUNTER — Ambulatory Visit (INDEPENDENT_AMBULATORY_CARE_PROVIDER_SITE_OTHER): Payer: Medicaid Other | Admitting: Family Medicine

## 2011-11-26 VITALS — BP 110/80 | HR 75 | Ht 64.0 in | Wt 144.0 lb

## 2011-11-26 DIAGNOSIS — F902 Attention-deficit hyperactivity disorder, combined type: Secondary | ICD-10-CM | POA: Insufficient documentation

## 2011-11-26 DIAGNOSIS — M545 Low back pain, unspecified: Secondary | ICD-10-CM

## 2011-11-26 DIAGNOSIS — F909 Attention-deficit hyperactivity disorder, unspecified type: Secondary | ICD-10-CM | POA: Insufficient documentation

## 2011-11-26 DIAGNOSIS — Z0000 Encounter for general adult medical examination without abnormal findings: Secondary | ICD-10-CM

## 2011-11-26 DIAGNOSIS — G5600 Carpal tunnel syndrome, unspecified upper limb: Secondary | ICD-10-CM

## 2011-11-26 DIAGNOSIS — R51 Headache: Secondary | ICD-10-CM

## 2011-11-26 DIAGNOSIS — Z23 Encounter for immunization: Secondary | ICD-10-CM

## 2011-11-26 DIAGNOSIS — F341 Dysthymic disorder: Secondary | ICD-10-CM

## 2011-11-26 LAB — LIPID PANEL
Cholesterol: 166 mg/dL (ref 0–200)
HDL: 52 mg/dL (ref >39)
LDL Cholesterol: 93 mg/dL (ref 0–99)
Total CHOL/HDL Ratio: 3.2 Ratio
Triglycerides: 104 mg/dL (ref <150)
VLDL: 21 mg/dL (ref 0–40)

## 2011-11-26 LAB — COMPREHENSIVE METABOLIC PANEL
ALT: 23 U/L (ref 0–35)
AST: 28 U/L (ref 0–37)
Albumin: 4.4 g/dL (ref 3.5–5.2)
Alkaline Phosphatase: 76 U/L (ref 39–117)
BUN: 10 mg/dL (ref 6–23)
CO2: 20 mEq/L (ref 19–32)
Calcium: 9.7 mg/dL (ref 8.4–10.5)
Chloride: 104 mEq/L (ref 96–112)
Creat: 0.68 mg/dL (ref 0.50–1.10)
Glucose, Bld: 83 mg/dL (ref 70–99)
Potassium: 4.2 mEq/L (ref 3.5–5.3)
Sodium: 136 mEq/L (ref 135–145)
Total Bilirubin: 0.4 mg/dL (ref 0.3–1.2)
Total Protein: 7 g/dL (ref 6.0–8.3)

## 2011-11-26 LAB — CBC WITH DIFFERENTIAL/PLATELET
Basophils Absolute: 0 10*3/uL (ref 0.0–0.1)
Basophils Relative: 0 % (ref 0–1)
Eosinophils Absolute: 0.2 10*3/uL (ref 0.0–0.7)
Eosinophils Relative: 2 % (ref 0–5)
HCT: 42.9 % (ref 36.0–46.0)
Hemoglobin: 14.5 g/dL (ref 12.0–15.0)
Lymphocytes Relative: 34 % (ref 12–46)
Lymphs Abs: 3.7 10*3/uL (ref 0.7–4.0)
MCH: 29.4 pg (ref 26.0–34.0)
MCHC: 33.8 g/dL (ref 30.0–36.0)
MCV: 87 fL (ref 78.0–100.0)
Monocytes Absolute: 0.7 10*3/uL (ref 0.1–1.0)
Monocytes Relative: 7 % (ref 3–12)
Neutro Abs: 6.1 10*3/uL (ref 1.7–7.7)
Neutrophils Relative %: 57 % (ref 43–77)
Platelets: 289 10*3/uL (ref 150–400)
RBC: 4.93 MIL/uL (ref 3.87–5.11)
RDW: 13.2 % (ref 11.5–15.5)
WBC: 10.7 10*3/uL — ABNORMAL HIGH (ref 4.0–10.5)

## 2011-11-26 LAB — POCT URINALYSIS DIPSTICK
Bilirubin, UA: NEGATIVE
Blood, UA: NEGATIVE
Glucose, UA: NEGATIVE
Ketones, UA: NEGATIVE
Leukocytes, UA: NEGATIVE
Nitrite, UA: NEGATIVE
Protein, UA: NEGATIVE
Spec Grav, UA: 1.02
Urobilinogen, UA: NEGATIVE
pH, UA: 6

## 2011-11-26 MED ORDER — AMPHETAMINE-DEXTROAMPHETAMINE 10 MG PO TABS: 10.0000 mg | ORAL_TABLET | Freq: Every day | ORAL | Status: DC

## 2011-11-26 MED ORDER — LISDEXAMFETAMINE DIMESYLATE 60 MG PO CAPS: 60.0000 mg | ORAL_CAPSULE | ORAL | Status: DC

## 2011-11-26 MED ORDER — BUPROPION HCL 100 MG PO TABS: 100.0000 mg | ORAL_TABLET | Freq: Two times a day (BID) | ORAL | Status: DC

## 2011-11-26 NOTE — Progress Notes (Signed)
  Subjective:    Patient ID: [name], female    DOB: [birth year], 31 y.o.   MRN: [id number]  HPI She is here for complete examination. She complains of excessive dry mouth as well as continued difficulty with constipation and occasional back pain. She also has had intermittent difficulty with headaches and increased irritability. She continues on her ADD medication. She uses Vyvanse and Adderall and is having no difficulty with that. Over the last 6 months she has noted increased difficulty with tingling sensation in her thumb index and third finger. Initially it was quite intermittent in nature. Now she notices this with any physical activity. She is a stay-at-home mother and states that her home life is going quite well.  Review of Systems     Objective:   Physical Exam BP 110/80  Pulse 75  Ht 5\' 4"  (1.626 m)  Wt 144 lb (65.318 kg)  BMI 24.72 kg/m2  General Appearance:    Alert, cooperative, no distress, appears stated age  Head:    Normocephalic, without obvious abnormality, atraumatic  Eyes:    PERRL, conjunctiva/corneas clear, EOM's intact, fundi    benign  Ears:    Normal TM's and external ear canals  Nose:   Nares normal, mucosa normal, no drainage or sinus   tenderness  Throat:   Lips, mucosa, and tongue normal; teeth and gums normal  Neck:   Supple, no lymphadenopathy;  thyroid:  no   enlargement/tenderness/nodules; no carotid   bruit or JVD  Back:    Spine nontender, no curvature, ROM normal, no CVA     tenderness  Lungs:     Clear to auscultation bilaterally without wheezes, rales or     ronchi; respirations unlabored  Chest Wall:    No tenderness or deformity   Heart:    Regular rate and rhythm, S1 and S2 normal, no murmur, rub   or gallop  Breast Exam:    Deferred to GYN  Abdomen:     Soft, non-tender, nondistended, normoactive bowel sounds,    no masses, no hepatosplenomegaly  Genitalia:    Deferred to GYN     Extremities:   No clubbing, cyanosis or  edema.Bilateral Phalen and Tinel's test was positive. Normal strength noted.   Pulses:   2+ and symmetric all extremities  Skin:   Skin color, texture, turgor normal, no rashes or lesions  Lymph nodes:   Cervical, supraclavicular, and axillary nodes normal  Neurologic:   CNII-XII intact, normal strength, sensation and gait; reflexes 2+ and symmetric throughout          Psych:   Normal mood, affect, hygiene and grooming.    Urinalysis is negative       Assessment & Plan:   1. Lower back pain  POCT Urinalysis Dipstick  2. CTS (carpal tunnel syndrome)  Nerve conduction test  3. Routine general medical examination at a health care facility  Flu vaccine greater than or equal to 3yo preservative free IM, CBC with Differential, Comprehensive metabolic panel, Lipid panel  4. ADD (attention deficit disorder with hyperactivity)    5. Dysthymia    6. Headache  CBC with Differential, Comprehensive metabolic panel, Lipid panel   nerve conduction studies will be ordered. We discussed injection versus splinting. I will also add Wellbutrin to her regimen. She will let me know how this works. I will remove her Vyvanse and Adderall. Encouraged her to drink more fluids.

## 2011-12-24 ENCOUNTER — Other Ambulatory Visit: Payer: Self-pay | Admitting: Family Medicine

## 2011-12-24 ENCOUNTER — Telehealth: Payer: Self-pay | Admitting: Family Medicine

## 2011-12-24 MED ORDER — LISDEXAMFETAMINE DIMESYLATE 60 MG PO CAPS: 60.0000 mg | ORAL_CAPSULE | ORAL | Status: DC

## 2011-12-24 NOTE — Telephone Encounter (Signed)
Pt called out of Vyvanse 60 mg.  Her last refill from [facility] was 1/7 which should taker her thru 2/5.  Her next rx is written for 2/8.  Discussed with Dr. Susann Givens and reviewed CRS.  Ok per Dr. Susann Givens 7 pills given,  Pt advised that insurance may not pay for extra??Kristian Covey signed off on Rx for Dr. Susann Givens

## 2012-02-22 ENCOUNTER — Telehealth: Payer: Self-pay | Admitting: Family Medicine

## 2012-02-22 MED ORDER — AMPHETAMINE-DEXTROAMPHETAMINE 10 MG PO TABS: 10.0000 mg | ORAL_TABLET | Freq: Every day | ORAL | Status: DC

## 2012-02-22 MED ORDER — LISDEXAMFETAMINE DIMESYLATE 60 MG PO CAPS: 60.0000 mg | ORAL_CAPSULE | ORAL | Status: DC

## 2012-02-22 NOTE — Telephone Encounter (Signed)
Vyvanse and Adderall prescriptions rewritten

## 2012-02-23 ENCOUNTER — Telehealth: Payer: Self-pay | Admitting: Family Medicine

## 2012-02-23 NOTE — Telephone Encounter (Signed)
LM

## 2012-05-15 ENCOUNTER — Telehealth: Payer: Self-pay | Admitting: Internal Medicine

## 2012-05-15 MED ORDER — SERTRALINE HCL 100 MG PO TABS: 100.0000 mg | ORAL_TABLET | Freq: Every day | ORAL | Status: DC

## 2012-05-15 NOTE — Telephone Encounter (Signed)
Left message for pt she needs appt to discuss dosing

## 2012-05-15 NOTE — Telephone Encounter (Signed)
Have her set up an appointment to discuss her Vyvanse dosing

## 2012-05-15 NOTE — Telephone Encounter (Signed)
Sent zoloft 100mg  to [facility].

## 2012-05-16 ENCOUNTER — Institutional Professional Consult (permissible substitution): Payer: Medicaid Other | Admitting: Family Medicine

## 2012-05-26 ENCOUNTER — Encounter: Payer: Self-pay | Admitting: Medical

## 2012-05-26 ENCOUNTER — Ambulatory Visit (INDEPENDENT_AMBULATORY_CARE_PROVIDER_SITE_OTHER): Payer: Medicaid Other | Admitting: Medical

## 2012-05-26 VITALS — BP 118/80 | HR 60 | Temp 97.4°F | Resp 16 | Wt 151.0 lb

## 2012-05-26 DIAGNOSIS — F988 Other specified behavioral and emotional disorders with onset usually occurring in childhood and adolescence: Secondary | ICD-10-CM

## 2012-05-26 MED ORDER — LISDEXAMFETAMINE DIMESYLATE 70 MG PO CAPS: 70.0000 mg | ORAL_CAPSULE | ORAL | Status: DC

## 2012-05-26 MED ORDER — AMPHETAMINE-DEXTROAMPHETAMINE 10 MG PO TABS: 10.0000 mg | ORAL_TABLET | Freq: Every day | ORAL | Status: DC

## 2012-05-26 NOTE — Progress Notes (Signed)
  Subjective:   HPI  [name] is a 31 y.o. female who presents for concern of ADD. She was last seen here in January for multiple issues as well as recheck on ADD.  At that time she was having some agitation issues per last chart records, and Wellbutrin was added.  She notes today that she took the Wellbutrin for 2 months but didn't feel like it was helping, so she stopped it.  She is still taking Zoloft daily.  She takes Vyvanse 60mg  about 8am in the morning, it lasts about 6 hours, and takes the Adderral 10mg  short acting about 3pm.  She uses the medication to help with concentration and focus.  She has 3 small children, and to be able to get through the day, she says things are "ugly" if not on the medication.  When not on the medication she is very tired, can't get anything done, can't stay on task or can't start things.  This is difficult with 3 small children.  Diagnosed with ADD in 5th grade.  She is a stay at home mom.  She has 3 kids, 8yo, 5yo and 3yo.     She currently walks for exercise, once daily most of the time, does some calisthenics.  Eats healthy diet.   Avoid sodas and high caffeine high sugar drinks.   She notes that she had been on XR Adderral prior, but at higher dose it gave her anxiety.  In late 2011 she was switched to Vyvanse and has remained on 60mg  Vyvanse with Adderall in the afternoon since. This had done well until the last several weeks.  She had made the change to Vyvanse in late 2011 after being more irritable and sweating.  At that time she had stopped exercising, and looking back thinks the lack of exercise played a big role, thus, partly why Wellbutrin didn't seem to help.  Currently sleep is ok, appetite is fine.  She feels like the Vyvanse doesn't quite give her the help with focus that she needs.  The afternoon adderal seems to work fine.  She requested an increase to 70mg  on the morning Vyvanse dose to help with focus and attention.  No other c/o.   The  following portions of the patient's history were reviewed and updated as appropriate: allergies, current medications, past family history, past medical history, past social history, past surgical history and problem list.  No past medical history on file.  No Known Allergies   Review of Systems ROS reviewed and was negative other than noted in HPI or above.    Objective:   Physical Exam  General appearance: alert, no distress, WD/WN Neck: supple, no lymphadenopathy, no thyromegaly, no masses Heart: RRR, normal S1, S2, no murmurs Lungs: CTA bilaterally, no wheezes, rhonchi, or rales Pulses: 2+ symmetric   Assessment and Plan :     Encounter Diagnosis  Name Primary?  . ADD (attention deficit disorder) Yes   Discussed her concerns, risk/benefits of changes in dose, sleep hygeine, appetite, exercise.   Will increase to Vyvanse 70mg  in the morning and c/t Adderall 10mg  regular release about 3pm.   C/t healthy diet, exercise daily, and discussed strategies to deal with focus/attention.  F/u with Dr. Susann Givens in 43mo.

## 2012-06-12 ENCOUNTER — Telehealth: Payer: Self-pay | Admitting: Internal Medicine

## 2012-06-12 NOTE — Telephone Encounter (Signed)
Left message that she needs to make appt

## 2012-06-12 NOTE — Telephone Encounter (Signed)
Have her come in .  I like to talk to her about all this

## 2012-06-26 ENCOUNTER — Other Ambulatory Visit: Payer: Self-pay | Admitting: Family Medicine

## 2012-06-26 MED ORDER — LISDEXAMFETAMINE DIMESYLATE 70 MG PO CAPS: 70.0000 mg | ORAL_CAPSULE | ORAL | Status: DC

## 2012-06-26 MED ORDER — AMPHETAMINE-DEXTROAMPHETAMINE 10 MG PO TABS: 10.0000 mg | ORAL_TABLET | Freq: Every day | ORAL | Status: DC

## 2012-06-26 NOTE — Telephone Encounter (Signed)
Vyvanse and Adderall renewed 

## 2012-07-31 ENCOUNTER — Ambulatory Visit (INDEPENDENT_AMBULATORY_CARE_PROVIDER_SITE_OTHER): Payer: Medicaid Other | Admitting: Family Medicine

## 2012-07-31 ENCOUNTER — Encounter: Payer: Self-pay | Admitting: Family Medicine

## 2012-07-31 VITALS — Wt 152.0 lb

## 2012-07-31 DIAGNOSIS — F909 Attention-deficit hyperactivity disorder, unspecified type: Secondary | ICD-10-CM

## 2012-07-31 DIAGNOSIS — F410 Panic disorder [episodic paroxysmal anxiety] without agoraphobia: Secondary | ICD-10-CM

## 2012-07-31 MED ORDER — SERTRALINE HCL 100 MG PO TABS: ORAL_TABLET | ORAL | Status: DC

## 2012-07-31 NOTE — Patient Instructions (Signed)
Let me know if the increased dose of the Zoloft does. Keep in mind what I said about going in reading about it and also pre-programming yourself for success

## 2012-07-31 NOTE — Progress Notes (Signed)
  Subjective:    Patient ID: [name], female    DOB: Nov 09, 1981, 31 y.o.   MRN: [id number]  HPI She is here for consultation concerning increasing her Zoloft. She notes difficulty with anxiety and mentions social situations of having to talk to people or being in crowds. She is now also experiencing anticipatory anxiety. She does have an underlying history of dysthymia.  Review of Systems     Objective:   Physical Exam Alert and in no distress with appropriate affect       Assessment & Plan:   1. Panic disorder  sertraline (ZOLOFT) 100 MG tablet  2. ADHD (attention deficit hyperactivity disorder)     I will increase her Zoloft and see what this will do to help. She is to call me in approximately one month.

## 2012-09-20 ENCOUNTER — Telehealth: Payer: Self-pay | Admitting: Family Medicine

## 2012-09-21 ENCOUNTER — Telehealth: Payer: Self-pay | Admitting: Family Medicine

## 2012-09-21 MED ORDER — LISDEXAMFETAMINE DIMESYLATE 70 MG PO CAPS: 70.0000 mg | ORAL_CAPSULE | ORAL | Status: DC

## 2012-09-21 MED ORDER — AMPHETAMINE-DEXTROAMPHETAMINE 10 MG PO TABS: 10.0000 mg | ORAL_TABLET | Freq: Every day | ORAL | Status: DC

## 2012-09-21 NOTE — Telephone Encounter (Signed)
LM

## 2012-09-21 NOTE — Telephone Encounter (Signed)
Vyvanse renewed. Adjustments were made for 31 day months.

## 2012-10-03 ENCOUNTER — Telehealth: Payer: Self-pay | Admitting: Family Medicine

## 2012-10-03 MED ORDER — LISDEXAMFETAMINE DIMESYLATE 70 MG PO CAPS: 70.0000 mg | ORAL_CAPSULE | ORAL | Status: DC

## 2012-10-03 MED ORDER — AMPHETAMINE-DEXTROAMPHETAMINE 10 MG PO TABS: 10.0000 mg | ORAL_TABLET | Freq: Every day | ORAL | Status: DC

## 2012-10-03 NOTE — Telephone Encounter (Signed)
Documentation was put in the wrong for Adderall which should've been Vyvanse. She received Vyvanse and Adderall for the months of November December and January.

## 2012-10-03 NOTE — Telephone Encounter (Signed)
LM

## 2012-10-09 ENCOUNTER — Ambulatory Visit
Admission: RE | Admit: 2012-10-09 | Discharge: 2012-10-09 | Disposition: A | Discharge status: Home / Self Care | Payer: Medicaid Other | Source: Ambulatory Visit | Attending: Family Medicine | Admitting: Family Medicine

## 2012-10-09 ENCOUNTER — Encounter: Payer: Self-pay | Admitting: Family Medicine

## 2012-10-09 ENCOUNTER — Ambulatory Visit (INDEPENDENT_AMBULATORY_CARE_PROVIDER_SITE_OTHER): Payer: Medicaid Other | Admitting: Family Medicine

## 2012-10-09 VITALS — BP 118/80 | HR 72 | Temp 98.2°F | Ht 64.0 in | Wt 153.0 lb

## 2012-10-09 DIAGNOSIS — M545 Low back pain, unspecified: Secondary | ICD-10-CM

## 2012-10-09 DIAGNOSIS — R109 Unspecified abdominal pain: Secondary | ICD-10-CM

## 2012-10-09 DIAGNOSIS — Z23 Encounter for immunization: Secondary | ICD-10-CM

## 2012-10-09 LAB — POCT URINALYSIS DIPSTICK
Bilirubin, UA: NEGATIVE
Glucose, UA: NEGATIVE
Ketones, UA: NEGATIVE
Nitrite, UA: NEGATIVE
Protein, UA: NEGATIVE
Spec Grav, UA: 1.01
Urobilinogen, UA: NEGATIVE
pH, UA: 5

## 2012-10-09 MED ORDER — HYDROCODONE-ACETAMINOPHEN 5-500 MG PO TABS: 1.0000 | ORAL_TABLET | Freq: Four times a day (QID) | ORAL | Status: DC | PRN

## 2012-10-09 NOTE — Patient Instructions (Addendum)
Drink LOTS of fluids (noncaffeinated, non-alcoholic; water is best--try bottled or filtered). If you develop fevers, let us know. If pain is worsening, may need further evaluation.  If it shows a large stone, you will be referred back to [facility]. If no stone shows on x-ray, but ongoing/worsening pain, we will need to arrange for CT scan

## 2012-10-09 NOTE — Progress Notes (Signed)
Chief Complaint  Patient presents with  . Flank Pain    low back pain and feeling sick since Friday.   HPI:  Having some back discomfort off and on x months, mild, and she didn't really pay much attention to it.  Until Friday, when it started getting worse.  Having pain in her R flank.  Feels similar to prior kidney stones.  H/o kidney stones on both sides in the past.  Required surgery on the right to remove stone, and required lithotripsy on the left.  Last treatment for stone was '08 or '09.  +nausea, no vomiting.  +fatigue.  Denies fevers.  Having a little more frequency to urination, just small amounts.  Denies dysuria, doesn't feel similar to previous UTI's.   Notices that her urine appears somewhat different--slightly darker, no odor, no blood.  Feels similar to previous kidney stones.  Denies any reason for getting stones in the past, was told maybe related to the well water.  She doesn't recall what kind of stone she had in the past.  Knows that at least one of the stones showed up on regular x-rays.  Pain is only 4-5/10, similar to intensity of pain with previous stones.  Pain comes in waves, currently pain-free.  Denies any change in activity, injury, no pain with particular positions or movements.  Has taken ibuprofen with some improvement.  Review of computer chart (no paper chart available for today's visit):  07/2007 distal R ureteral stone with hydroureteronephrosis.  Past Medical History  Diagnosis Date  . Kidney stones   . ADD (attention deficit disorder)    Past Surgical History  Procedure Date  . Kidney stone surgery     R--surgery; L lithotripsy.  Dr. Patsi Sears   History   Social History  . Marital Status: Married    Spouse Name: N/A    Number of Children: N/A  . Years of Education: N/A   Occupational History  . Not on file.   Social History Main Topics  . Smoking status: Never Smoker   . Smokeless tobacco: Never Used  . Alcohol Use: No  . Drug Use: No  .  Sexually Active: Yes -- Female partner(s)    Birth Control/ Protection: None   Other Topics Concern  . Not on file   Social History Narrative  . No narrative on file   Current Outpatient Prescriptions on File Prior to Visit  Medication Sig Dispense Refill  . amphetamine-dextroamphetamine (ADDERALL) 10 MG tablet Take 1 tablet (10 mg total) by mouth daily.  30 tablet  0  . amphetamine-dextroamphetamine (ADDERALL) 10 MG tablet Take 1 tablet (10 mg total) by mouth daily.  30 tablet  0  . lisdexamfetamine (VYVANSE) 70 MG capsule Take 1 capsule (70 mg total) by mouth every morning.  30 capsule  0  . lisdexamfetamine (VYVANSE) 70 MG capsule Take 1 capsule (70 mg total) by mouth every morning.  30 capsule  0  . lisdexamfetamine (VYVANSE) 70 MG capsule Take 1 capsule (70 mg total) by mouth every morning.  30 capsule  0  . sertraline (ZOLOFT) 100 MG tablet Take 1-1/2 pills per day.  45 tablet  1   No Known Allergies  ROS:  Denies fevers, vomiting, diarrhea, skin rash, numbness/tingling/weakness.  Denies hematuria, dysuria, vaginal discharge.  Denies pregnancy.  Moods have been good.  Some fatigue.  PHYSICAL EXAM: .BP 118/80  Pulse 72  Temp 98.2 F (36.8 C) (Oral)  Ht 5\' 4"  (1.626 m)  Wt 153  lb (69.4 kg)  BMI 26.26 kg/m2  LMP 09/25/2012 Well developed pleasant female, currently in no distress, with her sleeping son on her lap Heart: regular rate and rhythm without murmur Lungs: clear bilaterally Abdomen: sot, nontender Back: no spine tenderness. +CVA tenderness on R Skin: no rash Psych: normal mood, affect, hygiene and grooming  Urine dip--trace blood and leuks  ASSESSMENT/PLAN:  1. LBP (low back pain)  POCT Urinalysis Dipstick  2. Need for prophylactic vaccination and inoculation against influenza  Flu vaccine greater than or equal to 3yo preservative free IM  3. Right flank pain  DG Abd 1 View   h/o kidney stones with similar symptoms.  send for KUB.  if negative and worsening  pain, needs CT.  Hydrate and pain control   R flank pain, similar to discomfort of previous kidney stones.  No evidence of pyelonephritis. KUB of abdomen.  If stone appears small, then recommend supportive measures with fluids and pain control. Rx for Vicodin to use prn severe pain. If stone is large, refer to [facility]. If no stone seen, but pain is worsening, despite increasing fluids, then get CT scan.  Pt understands the plan. rx'd vicodin prn for pain control, in case pain should worsen in near future.  Reviewed risks/side effects of meds.

## 2012-10-17 ENCOUNTER — Telehealth: Payer: Self-pay | Admitting: Internal Medicine

## 2012-10-18 ENCOUNTER — Other Ambulatory Visit: Payer: Self-pay

## 2012-10-18 DIAGNOSIS — F410 Panic disorder [episodic paroxysmal anxiety] without agoraphobia: Secondary | ICD-10-CM

## 2012-10-18 MED ORDER — SERTRALINE HCL 100 MG PO TABS: ORAL_TABLET | ORAL | Status: DC

## 2012-10-18 NOTE — Telephone Encounter (Signed)
done

## 2012-10-18 NOTE — Telephone Encounter (Signed)
Sent in refill on zoloft

## 2012-12-07 ENCOUNTER — Ambulatory Visit (INDEPENDENT_AMBULATORY_CARE_PROVIDER_SITE_OTHER): Payer: Medicaid Other | Admitting: Family Medicine

## 2012-12-07 ENCOUNTER — Encounter: Payer: Self-pay | Admitting: Family Medicine

## 2012-12-07 VITALS — BP 104/74 | HR 68 | Temp 98.9°F | Ht 64.0 in | Wt 150.0 lb

## 2012-12-07 DIAGNOSIS — J329 Chronic sinusitis, unspecified: Secondary | ICD-10-CM

## 2012-12-07 DIAGNOSIS — J40 Bronchitis, not specified as acute or chronic: Secondary | ICD-10-CM

## 2012-12-07 MED ORDER — AZITHROMYCIN 250 MG PO TABS: ORAL_TABLET | ORAL | Status: DC

## 2012-12-07 NOTE — Patient Instructions (Signed)
z-pak Decongestants (ie sudafed) and sinus rinses to help with sinus pressure.  Be careful with ADHD meds--may cause palpitations, and cut back/hold ADHD meds if having side effects. mucinex (guaifenesin)--regularly to loosen phlegm/mucus Dextromethorphan (ie Delsym syrup) as needed for cough Continue albuterol as needed Return if ongoing shortness of breath, worsening fevers, persistent cough, or other concerns.

## 2012-12-07 NOTE — Progress Notes (Signed)
Chief Complaint  Patient presents with  . Cough    since before Christmas, nasal pressure and congestion. Has had zero energy.    HPI: daughter was sick with mono just before Christmas break.  Patient started with chest congestion, cough.  Cough has persisted, and is getting "deeper and nastier".  Denies runny nose, but is congested, and having pain in sinuses across forehead and cheeks.  Usually temp runs low, so is actually having low grade fevers.  Slight PND.  Cough is mainly dry, sometimes gets up just small amount (hasn't spit it out or looked at it).  Chest feels tight, and has been using inhaler in the last couple of days, but doesn't think it helps.  Feeling short of breath with activity.  She has tried [employer] (some help), ibuprofen (some help), claritin, Mucinex for chest congestion (didn't really help), children's Dimetapp  Past Medical History  Diagnosis Date  . Kidney stones   . ADD (attention deficit disorder)    Past Surgical History  Procedure Date  . Kidney stone surgery     R--surgery; L lithotripsy.  Dr. Patsi Sears   History   Social History  . Marital Status: Married    Spouse Name: N/A    Number of Children: N/A  . Years of Education: N/A   Occupational History  . Not on file.   Social History Main Topics  . Smoking status: Never Smoker   . Smokeless tobacco: Never Used  . Alcohol Use: Yes     Comment: very rarely.  . Drug Use: No  . Sexually Active: Yes -- Female partner(s)    Birth Control/ Protection: None   Other Topics Concern  . Not on file   Social History Narrative  . No narrative on file    Current outpatient prescriptions:amphetamine-dextroamphetamine (ADDERALL) 10 MG tablet, Take 1 tablet (10 mg total) by mouth daily., Disp: 30 tablet, Rfl: 0;  amphetamine-dextroamphetamine (ADDERALL) 10 MG tablet, Take 1 tablet (10 mg total) by mouth daily., Disp: 30 tablet, Rfl: 0 etonogestrel-ethinyl estradiol (NUVARING) 0.12-0.015 MG/24HR  vaginal ring, Place 1 each vaginally every 28 (twenty-eight) days. Insert vaginally and leave in place for 3 consecutive weeks, then remove for 1 week., Disp: , Rfl: ;  lisdexamfetamine (VYVANSE) 70 MG capsule, Take 1 capsule (70 mg total) by mouth every morning., Disp: 30 capsule, Rfl: 0 lisdexamfetamine (VYVANSE) 70 MG capsule, Take 1 capsule (70 mg total) by mouth every morning., Disp: 30 capsule, Rfl: 0;  lisdexamfetamine (VYVANSE) 70 MG capsule, Take 1 capsule (70 mg total) by mouth every morning., Disp: 30 capsule, Rfl: 0;  sertraline (ZOLOFT) 100 MG tablet, Take 1-1/2 pills per day., Disp: 45 tablet, Rfl: 1 HYDROcodone-acetaminophen (VICODIN) 5-500 MG per tablet, Take 1 tablet by mouth every 6 (six) hours as needed for pain., Disp: 20 tablet, Rfl: 0  No Known Allergies  Ros:  Denies headaches (just sinus headaches), dizziness, chest pain, palpitations, nausea, vomiting, diarrhea, skin rash, joint pains.  PHYSICAL EXAM: BP 104/74  Pulse 68  Temp 98.9 F (37.2 C) (Tympanic)  Ht 5\' 4"  (1.626 m)  Wt 150 lb (68.04 kg)  BMI 25.75 kg/m2  LMP 11/27/2012 Well developed, congested female in no distress.  Rare cough HEENT: PERRL, EOMI, conjunctiva clear.  TM's and EAC's normal.   Nasal mucosa moderately edematous, mild erythema, no purulence. Tender to palpation over bilateral maxillary and frontal sinuses, a little more on right than L) Neck: no lymphadenopathy or mass Heart: regular rate and rhythm  without murmur Lungs: clear bilaterally Abdomen: soft, nontender, no organomegaly or mass. Skin: no rash Psych: normal mood, affect, hygiene and grooming  ASSESSMENT/PLAN: 1. Sinusitis  azithromycin (ZITHROMAX) 250 MG tablet  2. Bronchitis  azithromycin (ZITHROMAX) 250 MG tablet   Sinusitis +/- bronchitis.  Likely started as viral illness, possibly even mono z-pak Decongestants (ie sudafed) and sinus rinses to help with sinus pressure.  Be careful with ADHD meds--may cause palpitations, and  cut back/hold ADHD meds if having side effects. mucinex (guaifenesin)--regularly to loosen phlegm/mucus Dextromethorphan (ie Delsym syrup) as needed for cough Continue albuterol as needed Return if ongoing shortness of breath, worsening fevers, persistent cough, or other concerns.

## 2012-12-15 ENCOUNTER — Telehealth: Payer: Self-pay | Admitting: Family Medicine

## 2012-12-15 MED ORDER — PREDNISONE 20 MG PO TABS: 20.0000 mg | ORAL_TABLET | Freq: Two times a day (BID) | ORAL | Status: DC

## 2012-12-15 NOTE — Telephone Encounter (Signed)
LEFT MESSAGE FOR PT TO CALL BACK REGARDING DR KNAPP'S MESSAGE

## 2012-12-15 NOTE — Telephone Encounter (Signed)
If symptoms similar to visit, then okay for prednisone (rx sent to pharmacy).  If she is having fevers, or worsening discolored mucus or phlegm, then I'd also want to change antibiotic.  If no fevers, and mucus color/thickness is same or improved, then just use the prednisone.  The z-pak is still working in her system.  The prednisone will help with sinus pressure and cough (by decreasing inflammation and wheezing), but not necessarily make her feel immediately better.  Advise pt to return for re-eval if persistent/worsening symptoms, especially related to shortness of breath, fevers, worsening cough, despite these meds.

## 2012-12-18 ENCOUNTER — Telehealth: Payer: Self-pay | Admitting: Family Medicine

## 2012-12-18 ENCOUNTER — Ambulatory Visit (INDEPENDENT_AMBULATORY_CARE_PROVIDER_SITE_OTHER): Payer: Medicaid Other | Admitting: Family Medicine

## 2012-12-18 ENCOUNTER — Ambulatory Visit: Payer: Medicaid Other | Admitting: Family Medicine

## 2012-12-18 ENCOUNTER — Encounter: Payer: Self-pay | Admitting: Family Medicine

## 2012-12-18 VITALS — BP 110/80 | HR 72 | Temp 97.8°F | Ht 64.0 in | Wt 153.0 lb

## 2012-12-18 DIAGNOSIS — E559 Vitamin D deficiency, unspecified: Secondary | ICD-10-CM | POA: Insufficient documentation

## 2012-12-18 DIAGNOSIS — R5381 Other malaise: Secondary | ICD-10-CM

## 2012-12-18 DIAGNOSIS — R5383 Other fatigue: Secondary | ICD-10-CM

## 2012-12-18 LAB — CBC WITH DIFFERENTIAL/PLATELET
Basophils Absolute: 0 10*3/uL (ref 0.0–0.1)
Basophils Relative: 0 % (ref 0–1)
Eosinophils Absolute: 0 10*3/uL (ref 0.0–0.7)
Eosinophils Relative: 0 % (ref 0–5)
HCT: 39.8 % (ref 36.0–46.0)
Hemoglobin: 13.7 g/dL (ref 12.0–15.0)
Lymphocytes Relative: 15 % (ref 12–46)
Lymphs Abs: 1.7 10*3/uL (ref 0.7–4.0)
MCH: 29.8 pg (ref 26.0–34.0)
MCHC: 34.4 g/dL (ref 30.0–36.0)
MCV: 86.5 fL (ref 78.0–100.0)
Monocytes Absolute: 0.4 10*3/uL (ref 0.1–1.0)
Monocytes Relative: 4 % (ref 3–12)
Neutro Abs: 8.8 10*3/uL — ABNORMAL HIGH (ref 1.7–7.7)
Neutrophils Relative %: 81 % — ABNORMAL HIGH (ref 43–77)
Platelets: 293 10*3/uL (ref 150–400)
RBC: 4.6 MIL/uL (ref 3.87–5.11)
RDW: 14.4 % (ref 11.5–15.5)
WBC: 11 10*3/uL — ABNORMAL HIGH (ref 4.0–10.5)

## 2012-12-18 LAB — POCT MONO (EPSTEIN BARR VIRUS): Mono, POC: NEGATIVE

## 2012-12-18 MED ORDER — AMPHETAMINE-DEXTROAMPHETAMINE 10 MG PO TABS: 10.0000 mg | ORAL_TABLET | Freq: Every day | ORAL | Status: DC

## 2012-12-18 MED ORDER — LISDEXAMFETAMINE DIMESYLATE 70 MG PO CAPS: 70.0000 mg | ORAL_CAPSULE | ORAL | Status: DC

## 2012-12-18 NOTE — Telephone Encounter (Signed)
Adderall and Vyvanse renewed 

## 2012-12-18 NOTE — Progress Notes (Signed)
Chief Complaint  Patient presents with  . Sinus pain and pressure    sinus pain and pressure still present since last OV, fatigue lack of energy increased. Would like to be checked for mono.   HPI: Started prednisone 4 days ago--sinus pressure and cough are improved.  Denies significant fevers (?low grade).  Nose is stuffy, not draining.  Having postnasal drainage, but unable to cough up the mucus.  Denies any chest pain, shortness of breath.  Presents today with complaint of worsening fatigue.  Tired (and ?short of breath) with exertion, like walking to car.  Sleeping fine at night, plus napping.  Her daughter was diagnosed with mono prior to Christmas.  She would like mono testing today, given her ongoing fatigue.  Denies swollen glands, fevers, sore throat.  Periods are monthly, occasionally heavy.  Has Nuva ring (first month using).  She recalls being treated for vitamin D deficiency by her GYN over a year ago.  Took Rx, but never used OTC supplements.  Past Medical History  Diagnosis Date  . Kidney stones   . ADD (attention deficit disorder)    Past Surgical History  Procedure Date  . Kidney stone surgery     R--surgery; L lithotripsy.  Dr. Patsi Sears   History   Social History  . Marital Status: Married    Spouse Name: N/A    Number of Children: N/A  . Years of Education: N/A   Occupational History  . Not on file.   Social History Main Topics  . Smoking status: Never Smoker   . Smokeless tobacco: Never Used  . Alcohol Use: Yes     Comment: very rarely.  . Drug Use: No  . Sexually Active: Yes -- Female partner(s)    Birth Control/ Protection: None   Other Topics Concern  . Not on file   Social History Narrative  . No narrative on file   Current Outpatient Prescriptions on File Prior to Visit  Medication Sig Dispense Refill  . etonogestrel-ethinyl estradiol (NUVARING) 0.12-0.015 MG/24HR vaginal ring Place 1 each vaginally every 28 (twenty-eight) days. Insert  vaginally and leave in place for 3 consecutive weeks, then remove for 1 week.      . predniSONE (DELTASONE) 20 MG tablet Take 1 tablet (20 mg total) by mouth 2 (two) times daily.  10 tablet  0  . sertraline (ZOLOFT) 100 MG tablet Take 1-1/2 pills per day.  45 tablet  1  . ALBUTEROL SULFATE HFA IN Inhale 2 puffs into the lungs as needed.      Marland Kitchen amphetamine-dextroamphetamine (ADDERALL) 10 MG tablet Take 1 tablet (10 mg total) by mouth daily.  30 tablet  0  . HYDROcodone-acetaminophen (VICODIN) 5-500 MG per tablet Take 1 tablet by mouth every 6 (six) hours as needed for pain.  20 tablet  0  . lisdexamfetamine (VYVANSE) 70 MG capsule Take 1 capsule (70 mg total) by mouth every morning.  30 capsule  0   No Known Allergies  ROS:  Denies fevers, dizziness, headache, chest pain, nausea, vomiting, diarrhea.  Cough/shortness of breath and sinus pressure are much improved since ABX and prednisone.  Denies bleeding, bruising, skin rash, GI complaints, GU complaints, or other concerns.  See HPI  PHYSICAL EXAM: BP 110/80  Pulse 72  Temp 97.8 F (36.6 C)  Ht 5\' 4"  (1.626 m)  Wt 153 lb (69.4 kg)  BMI 26.26 kg/m2  SpO2 98%  LMP 11/27/2012 Well developed, pleasant female in no distress HEENT:  PERRL, EOMI,  conjunctiva clear.  Nasal mucosa mildly edematous, pale, no erythema or purulence.  Sinuses nontender.  OP clear Neck: no lymphadenopathy or mass Heart: regular rate and rhythm without murmur Lungs: clear bilaterally Extremities: no edema Skin: no rash Psych: normal mood, affect, hygiene and grooming.  ASSESSMENT/PLAN:  1. Fatigue  Mono (Epstein Barr Virus), CBC with Differential, TSH, Vitamin D 25 hydroxy, Epstein-Barr virus VCA antibody panel  2. Unspecified vitamin D deficiency  Vitamin D 25 hydroxy   CBC and EBV titers (pt requests). TSH, Vitamin D-OH Explained need for longterm supplementation with 1000 IU daily to maintain Vitamin D levels.

## 2012-12-18 NOTE — Patient Instructions (Addendum)
Continue to drink plenty of fluids, get rest. It is recommended that you get 1000 IU of Vitamin D3 daily. If your levels are very low, we will prescribe a high dose weekly vitamin D, but once completed, go back to once daily over-the-counter 1000 IU every day.  We will notify you of lab tests soon.  The mono tests (epstein barr antibodies) take a few days.

## 2012-12-18 NOTE — Telephone Encounter (Signed)
APPT MADE

## 2012-12-19 LAB — VITAMIN D 25 HYDROXY (VIT D DEFICIENCY, FRACTURES): Vit D, 25-Hydroxy: 36 ng/mL (ref 30–89)

## 2012-12-19 LAB — TSH: TSH: 0.757 u[IU]/mL (ref 0.350–4.500)

## 2012-12-21 LAB — EPSTEIN-BARR VIRUS VCA ANTIBODY PANEL
EBV EA IgG: 9.9 U/mL — ABNORMAL HIGH (ref <9.0)
EBV NA IgG: 352 U/mL — ABNORMAL HIGH (ref <18.0)
EBV VCA IgG: 326 U/mL — ABNORMAL HIGH (ref <18.0)
EBV VCA IgM: <10 U/mL (ref <36.0)

## 2013-03-13 ENCOUNTER — Telehealth: Payer: Self-pay | Admitting: Internal Medicine

## 2013-03-13 MED ORDER — LISDEXAMFETAMINE DIMESYLATE 70 MG PO CAPS: 70.0000 mg | ORAL_CAPSULE | ORAL | Status: DC

## 2013-03-13 MED ORDER — AMPHETAMINE-DEXTROAMPHETAMINE 10 MG PO TABS: 10.0000 mg | ORAL_TABLET | Freq: Every day | ORAL | Status: DC

## 2013-03-13 NOTE — Telephone Encounter (Signed)
Pt needs a refill on adderall 10mg  and vyvanse 70mg  . Call when ready

## 2013-04-23 ENCOUNTER — Telehealth: Payer: Self-pay | Admitting: Family Medicine

## 2013-04-23 NOTE — Telephone Encounter (Signed)
SPOKE TO DR Susann Givens. HE REVIEWED FAX & APPROVED FOR SCRIPT TO BE FILLED ON TOMORROW (6/3). CALLED [facility] AND ADVISED STEPHANIE OF THIS DECISION.

## 2013-05-18 ENCOUNTER — Telehealth: Payer: Self-pay | Admitting: Internal Medicine

## 2013-05-18 NOTE — Telephone Encounter (Signed)
Pt needs a refill on adderall and vyvanse. Call when ready

## 2013-05-18 NOTE — Telephone Encounter (Signed)
PT SAID SHE ONLY HAD 03/24/2013 AND 04/24/2013 NO 05/24/13

## 2013-05-18 NOTE — Telephone Encounter (Signed)
Prescriptions were written through July already

## 2013-05-21 ENCOUNTER — Other Ambulatory Visit: Payer: Self-pay | Admitting: Family Medicine

## 2013-05-21 NOTE — Telephone Encounter (Signed)
Pt is going to call back after she sees if she can find a rx script that dr. Susann Givens

## 2013-05-21 NOTE — Telephone Encounter (Signed)
IS THIS OK 

## 2013-05-29 ENCOUNTER — Telehealth: Payer: Self-pay | Admitting: Family Medicine

## 2013-05-29 NOTE — Telephone Encounter (Signed)
PT PICKED UP ADDERAL 10MG  #30 & VYVANSE 70MG  #30 WRITTEN SCRIPTS TODAY DATED 05/24/13

## 2013-07-02 ENCOUNTER — Telehealth: Payer: Self-pay | Admitting: Internal Medicine

## 2013-07-02 MED ORDER — AMPHETAMINE-DEXTROAMPHETAMINE 10 MG PO TABS: 10.0000 mg | ORAL_TABLET | Freq: Every day | ORAL | Status: DC

## 2013-07-02 MED ORDER — LISDEXAMFETAMINE DIMESYLATE 70 MG PO CAPS: 70.0000 mg | ORAL_CAPSULE | ORAL | Status: DC

## 2013-07-02 NOTE — Telephone Encounter (Signed)
Pt needs a refill on vyvnase and adderall. Call when ready

## 2013-07-02 NOTE — Telephone Encounter (Signed)
Vyvanse and Adderall renewed

## 2013-07-27 ENCOUNTER — Telehealth: Payer: Self-pay | Admitting: Family Medicine

## 2013-07-27 NOTE — Telephone Encounter (Signed)
LM

## 2013-07-27 NOTE — Telephone Encounter (Signed)
She called stating that she lost the last 2 prescriptions for September and October. I will rewrite them for 08/02/2013 and 09/01/13 for both medications.

## 2013-07-27 NOTE — Telephone Encounter (Signed)
Pt. Called and stated that she had lost a couple of rx. She states that she had got a new car and she thinks she left them in her old car that was traded in. Please rewrite sept and nov rx. Call when ready.

## 2013-09-27 ENCOUNTER — Other Ambulatory Visit: Payer: Self-pay

## 2013-10-01 ENCOUNTER — Telehealth: Payer: Self-pay | Admitting: Family Medicine

## 2013-10-01 MED ORDER — AMPHETAMINE-DEXTROAMPHETAMINE 10 MG PO TABS: 10.0000 mg | ORAL_TABLET | Freq: Every day | ORAL | Status: DC

## 2013-10-01 MED ORDER — LISDEXAMFETAMINE DIMESYLATE 70 MG PO CAPS: 70.0000 mg | ORAL_CAPSULE | ORAL | Status: DC

## 2013-10-01 NOTE — Telephone Encounter (Signed)
Her ADD meds were renewed

## 2013-10-01 NOTE — Telephone Encounter (Signed)
Phoned pt advised add rx ready to pick up

## 2013-11-29 ENCOUNTER — Encounter: Payer: Self-pay | Admitting: Family Medicine

## 2013-11-29 ENCOUNTER — Ambulatory Visit (INDEPENDENT_AMBULATORY_CARE_PROVIDER_SITE_OTHER): Payer: Medicaid Other | Admitting: Family Medicine

## 2013-11-29 VITALS — BP 140/88 | HR 80 | Temp 97.5°F | Ht 64.0 in | Wt 159.0 lb

## 2013-11-29 DIAGNOSIS — J069 Acute upper respiratory infection, unspecified: Secondary | ICD-10-CM

## 2013-11-29 DIAGNOSIS — R52 Pain, unspecified: Secondary | ICD-10-CM

## 2013-11-29 DIAGNOSIS — R05 Cough: Secondary | ICD-10-CM

## 2013-11-29 DIAGNOSIS — R059 Cough, unspecified: Secondary | ICD-10-CM

## 2013-11-29 LAB — POC INFLUENZA A&B (BINAX/QUICKVUE)
Influenza A, POC: NEGATIVE
Influenza B, POC: NEGATIVE

## 2013-11-29 MED ORDER — BENZONATATE 200 MG PO CAPS: 200.0000 mg | ORAL_CAPSULE | Freq: Three times a day (TID) | ORAL | Status: DC | PRN

## 2013-11-29 NOTE — Patient Instructions (Signed)
  Drink plenty of fluids, and get rest. Take the following medications as needed (these are ingredients that are in many medications)  Guaifenesin (in Mucinex, Robitussin and other cold medications)--this is the expectorant to loosen up phlegm Decongestant (either pseudoephedrine, behind the counter at pharmacy, or phenylephrine, which is on the shelf)--to help with sinus pain and pressure Dextromethorphan--cough suppressant (DM in mucinex and Robitussin DM versions, or delsym syrup, and in other syrups).  Continue to use ibuprofen or tylenol as needed for pain and fever.  If the above measures aren't helping enough with cough, fill the prescription for tessalon perles.  Try doing sinus rinses (sinus rinse kit or neti-pot) to help flush out the sinuses

## 2013-11-29 NOTE — Progress Notes (Signed)
Chief Complaint  Patient presents with  . Generalized Body Aches    and chills, nasal congestion and cough, Not sure if she has had any fever-did not take. Started feeling it coming on Monday but worse by yesterday. Did not get flu shot, thinks her husband,daughter and son all had flu and would like to be tested.    Whole family has recently been sick--husband had it first, then daughter and son got similar illness.  They were not seen/evaluated/tested for flu.  Husband's temp maxed at 100, daughter at 43.  They have had sore throat, cough and congestion.  Their illnesses lasted 5-6 days. She is asking to be tested for influenza.  She started 3 days ago with sore throat and sinus pressure.  It has progressed to worsening sore throat, chills and worsening cough.  Feels like stuff is in her chest, but unable to cough it up.  Cough is nonproductive.  Not able to blow anything from her nose.  Some PND, unable to cough up.  Having some presure across her forehead and into her cheeks (bilaterally, equally).  She is having some pain around her eyes.  Slight ear pain on left, denies plugging/popping or decreased hearing.  Yesterday she felt like her temperature was high (lost thermometer), but today feels more low grade.  Hasn't taken ibuprofen since last night.  She has taken ibuprofen and dimetapp, with some improvement.  She has used Mucinex Cold and Cough (just one doseShe is unable to sleep due to the cough, and is requesting cough med.  She has exercise-induced asthma. Hasn't needed to use albuterol with this illness, but does feel tightness when outside in the very cold (10 degrees today).  Past Medical History  Diagnosis Date  . Kidney stones   . ADD (attention deficit disorder)   . Exercise-induced asthma    Past Surgical History  Procedure Laterality Date  . Kidney stone surgery      R--surgery; L lithotripsy.  Dr. Gaynelle Arabian   History   Social History  . Marital Status: Married     Spouse Name: N/A    Number of Children: N/A  . Years of Education: N/A   Occupational History  . Not on file.   Social History Main Topics  . Smoking status: Never Smoker   . Smokeless tobacco: Never Used  . Alcohol Use: No     Comment: none  . Drug Use: No  . Sexual Activity: Yes    Partners: Male    Birth Control/ Protection: None   Other Topics Concern  . Not on file   Social History Narrative  . No narrative on file   Current Outpatient Prescriptions on File Prior to Visit  Medication Sig Dispense Refill  . [START ON 12/01/2013] amphetamine-dextroamphetamine (ADDERALL) 10 MG tablet Take 1 tablet (10 mg total) by mouth daily.  30 tablet  0  . [START ON 12/01/2013] lisdexamfetamine (VYVANSE) 70 MG capsule Take 1 capsule (70 mg total) by mouth every morning.  30 capsule  0  . ALBUTEROL SULFATE HFA IN Inhale 2 puffs into the lungs as needed.       No current facility-administered medications on file prior to visit.   No Known Allergies  ROS:  See HPI.  No dizziness, chest pain, shortness of breath, nausea, vomiting, diarrhea, bleeding, bruising, rash. No urinary complaints, or other concerns except as per HPI.  PHYSICAL EXAM: BP 140/88  Pulse 80  Temp(Src) 97.5 F (36.4 C) (Tympanic)  Ht 5'  4" (1.626 m)  Wt 159 lb (72.122 kg)  BMI 27.28 kg/m2  LMP 11/26/2013  Well appearing female in no distress.  Not coughing, some throat-clearing HEENT:  PERRL, EOMI, conjunctiva clear.  TM's and EAC's normal.  Nasal mucosa is moderately edematous, no erythema or purulence. Mildly tender in sinuses x 4.  OP is clear without erythema. Neck: no lymphadenopathy or mass Heart: regular rate and rhythm without murmur Lungs: clear bilaterally. No wheeze with forced expiration, good air movement Skin: no rash Psych: normal mood, affect, hygiene and grooming Neuro: alert and oriented.  Cranial nerves intact. Normal strength, gait  Influenza test negative for  A&B  ASSESSMENT/PLAN:  Acute upper respiratory infections of unspecified site  Body aches - Plan: POC Influenza A&B (Binax test)  Cough - Plan: benzonatate (TESSALON) 200 MG capsule  Drink plenty of fluids, and get rest. Take the following medications as needed (these are ingredients that are in many medications)  Guaifenesin (in Mucinex, Robitussin and other cold medications)--this is the expectorant to loosen up phlegm Decongestant (either pseudoephedrine, behind the counter at pharmacy, or phenylephrine, which is on the shelf)--to help with sinus pain and pressure Dextromethorphan--cough suppressant (DM in mucinex and Robitussin DM versions, or delsym syrup, and in other syrups).  Continue to use ibuprofen or tylenol as needed for pain and fever.  If the above measures aren't helping enough with cough, fill the prescription for tessalon perles.  Try doing sinus rinses (sinus rinse kit or neti-pot) to help flush out the sinuses

## 2013-12-05 ENCOUNTER — Telehealth: Payer: Self-pay | Admitting: *Deleted

## 2013-12-05 ENCOUNTER — Other Ambulatory Visit: Payer: Self-pay | Admitting: *Deleted

## 2013-12-05 ENCOUNTER — Telehealth: Payer: Self-pay | Admitting: Family Medicine

## 2013-12-05 MED ORDER — AZITHROMYCIN 250 MG PO TABS: ORAL_TABLET | ORAL | Status: DC

## 2013-12-05 NOTE — Telephone Encounter (Signed)
Please document what her symptoms are (fever, discolored mucus, sinus headaches--ensure no shortness of breath, chest pain with breathing, etc).  If sounds like sinus infection, okay for zpak

## 2013-12-05 NOTE — Telephone Encounter (Signed)
Spoke with patient and she states that she is having sinus pain/pressure and sinus HA. Does not know if she has a temp as she does not have a way to check it. Unsure if mucus has any discoloration as nothing comes out when she blows her nose. She did tell me that Monday night she had vomiting and diarrhea, yesterday as well as today just feeling "queezy." She states that she thinks she has a sinus infection, she has had them before. Do you want me to call in zpack, I was unsure of what to do. Thanks.

## 2013-12-05 NOTE — Telephone Encounter (Signed)
The vomiting and diarrhea might be related to another virus.  But nausea could come from postnasal drainage.  If she thinks she is having sinus pain, that sinuses are worse, then ok to rx the zpak #6 tabs, no refill.  F/u if not improving

## 2013-12-05 NOTE — Telephone Encounter (Signed)
Spoke with patient and went over Dr.Knapp's recommendations, called in zpack for patient.

## 2013-12-05 NOTE — Telephone Encounter (Signed)
Pt called and stated that she is much worse than her visit on Friday. Pt is concerned she may now have an infection. Pt uses [facility] on [address].

## 2013-12-07 NOTE — Telephone Encounter (Signed)
done

## 2013-12-25 ENCOUNTER — Telehealth: Payer: Self-pay | Admitting: Family Medicine

## 2013-12-25 NOTE — Telephone Encounter (Signed)
Refill adderall and vyance call when ready/

## 2013-12-25 NOTE — Telephone Encounter (Signed)
Let her know that it is time for a followup on her ADD.

## 2013-12-26 ENCOUNTER — Institutional Professional Consult (permissible substitution): Payer: Medicaid Other | Admitting: Family Medicine

## 2013-12-27 ENCOUNTER — Ambulatory Visit (INDEPENDENT_AMBULATORY_CARE_PROVIDER_SITE_OTHER): Payer: Medicaid Other | Admitting: Family Medicine

## 2013-12-27 ENCOUNTER — Encounter: Payer: Self-pay | Admitting: Family Medicine

## 2013-12-27 VITALS — BP 122/82 | HR 68 | Wt 158.0 lb

## 2013-12-27 DIAGNOSIS — F341 Dysthymic disorder: Secondary | ICD-10-CM

## 2013-12-27 DIAGNOSIS — F909 Attention-deficit hyperactivity disorder, unspecified type: Secondary | ICD-10-CM

## 2013-12-27 DIAGNOSIS — Z79899 Other long term (current) drug therapy: Secondary | ICD-10-CM

## 2013-12-27 MED ORDER — AMPHETAMINE-DEXTROAMPHETAMINE 10 MG PO TABS: 10.0000 mg | ORAL_TABLET | Freq: Two times a day (BID) | ORAL | Status: DC

## 2013-12-27 MED ORDER — LISDEXAMFETAMINE DIMESYLATE 70 MG PO CAPS: 70.0000 mg | ORAL_CAPSULE | Freq: Every day | ORAL | Status: DC

## 2013-12-27 MED ORDER — SERTRALINE HCL 50 MG PO TABS: 50.0000 mg | ORAL_TABLET | Freq: Every day | ORAL | Status: DC

## 2013-12-27 MED ORDER — AMPHETAMINE-DEXTROAMPHETAMINE 10 MG PO TABS: 10.0000 mg | ORAL_TABLET | Freq: Every day | ORAL | Status: DC

## 2013-12-27 MED ORDER — LISDEXAMFETAMINE DIMESYLATE 70 MG PO CAPS: 70.0000 mg | ORAL_CAPSULE | ORAL | Status: DC

## 2013-12-27 NOTE — Progress Notes (Signed)
   Subjective:    Patient ID: [name], female    DOB: [birth year], 33 y.o.   MRN: [id number]  HPI She is here for recheck. She presently is on Zoloft and has been most this since high school. Originally she was placed on it more for its effect on her ADD which did help. She also notes that it definitely helps with mood stabilization. Recently she ran out and did run and is difficulty with drug withdrawal he knows not to do this again. She continues on Vyvanse and Adderall. The Vyvanse lasts roughly 8 hours. She usually will then take and Adderall around 3 in the afternoon to help her get through the rest per day. She has no difficulties with this medication and this does not interfere with her sleeping. She is very count will this dosing regimen. She lives with her husband and 3 children with her parents. The overall relationship is quite good. Her husband 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 and this relationship is apparently going very well as well.  Review of Systems     Objective:   Physical Exam Alert and in no distress with appropriate affect.       Assessment & Plan:  ADHD (attention deficit hyperactivity disorder) - Plan: lisdexamfetamine (VYVANSE) 70 MG capsule, lisdexamfetamine (VYVANSE) 70 MG capsule  Dysthymia  Encounter for long-term (current) use of other medications  I will continue her on her present ADHD medication regimen. Discussed stopping the Zoloft which she is interested in doing. I will taper her down to 50 mg for the next month and then stop. She will let me know she has any difficulties. After discussion with her is obvious that she has learned a lot of good life lessons and has a very good handle on her life.

## 2014-01-03 ENCOUNTER — Telehealth: Payer: Self-pay | Admitting: Family Medicine

## 2014-01-03 NOTE — Telephone Encounter (Signed)
Pt called and needs to visit her OBGYN Dr. Selinda Eon at [facility].  Her medicaid insurance requires a referral to her obgyn for a bacterial infection.  Pt ph [phone].

## 2014-01-03 NOTE — Telephone Encounter (Signed)
Find out whether the bleed bacterial is bladder related or vaginal. If its bladder have her come here otherwise go ahead and make her a referral

## 2014-01-03 NOTE — Telephone Encounter (Signed)
Called and gave NPI # and oked for 3 visits

## 2014-03-25 ENCOUNTER — Telehealth: Payer: Self-pay | Admitting: Family Medicine

## 2014-03-25 DIAGNOSIS — F909 Attention-deficit hyperactivity disorder, unspecified type: Secondary | ICD-10-CM

## 2014-03-26 ENCOUNTER — Ambulatory Visit (INDEPENDENT_AMBULATORY_CARE_PROVIDER_SITE_OTHER): Payer: Medicaid Other | Admitting: Family Medicine

## 2014-03-26 ENCOUNTER — Encounter: Payer: Self-pay | Admitting: Family Medicine

## 2014-03-26 VITALS — BP 100/80 | HR 80 | Wt 156.0 lb

## 2014-03-26 DIAGNOSIS — D1779 Benign lipomatous neoplasm of other sites: Secondary | ICD-10-CM

## 2014-03-26 DIAGNOSIS — D1720 Benign lipomatous neoplasm of skin and subcutaneous tissue of unspecified limb: Secondary | ICD-10-CM

## 2014-03-26 DIAGNOSIS — J309 Allergic rhinitis, unspecified: Secondary | ICD-10-CM

## 2014-03-26 DIAGNOSIS — F909 Attention-deficit hyperactivity disorder, unspecified type: Secondary | ICD-10-CM

## 2014-03-26 DIAGNOSIS — L989 Disorder of the skin and subcutaneous tissue, unspecified: Secondary | ICD-10-CM

## 2014-03-26 MED ORDER — AMPHETAMINE-DEXTROAMPHETAMINE 10 MG PO TABS: 10.0000 mg | ORAL_TABLET | Freq: Every day | ORAL | Status: DC

## 2014-03-26 MED ORDER — LISDEXAMFETAMINE DIMESYLATE 70 MG PO CAPS: 70.0000 mg | ORAL_CAPSULE | Freq: Every day | ORAL | Status: DC

## 2014-03-26 MED ORDER — AMPHETAMINE-DEXTROAMPHETAMINE 10 MG PO TABS: 10.0000 mg | ORAL_TABLET | Freq: Two times a day (BID) | ORAL | Status: DC

## 2014-03-26 MED ORDER — LISDEXAMFETAMINE DIMESYLATE 70 MG PO CAPS: 70.0000 mg | ORAL_CAPSULE | ORAL | Status: DC

## 2014-03-26 NOTE — Telephone Encounter (Signed)
ADD meds renewed

## 2014-03-26 NOTE — Progress Notes (Signed)
   Subjective:    Patient ID: [name], female    DOB: 1981-06-12, 33 y.o.   MRN: [id number]  [name] is a very pleasant 33 y.o. yo female who  has a past medical history of Kidney stones; ADD (attention deficit disorder); and Exercise-induced asthma. She presents today for allergic and dermatolgic issues.  HPI  The patient would like talk to dermatology about a few concerning spots she has noticed. The first spot is located on her nose and has been present for years. She denies any history of changing, darkening, itching or bleeding. The patient denies any excessive sun exposure and regularly wears sunscreen. Of note, the patient's Dad and grandfather both have a history of skin cancer.  The second area of concern are what the patient thinks might be fatty tumors on her arms. The first one she first noticed two years ago and is located on her left dorsal forearm a few inches above the wrist. She has noted that it is grown in the last two years. She denies any pain, swelling or overlying skin changes in the area. The second possible fatty tumor is located on the right forearm on the ventral portion of lower arm, a few inches from the joint.   Of note the patient's mother and brother both have a history of fatty tumors and her brother has had surgery in the past to have them removed.   The patient would also like to discuss the possibility of getting additional allergy testing today. The patient believes this is necessary because her allergies significantly affect her each spring and she has tried both zyrtec and claritin in the past which did not relieve her symptoms. The patient believes she is only allergic to environmental allergies and denies any indoor allergens, allergies to foods or allergies in the winter months.  The patient reports that her ADD medications are working well for her and she reports no negative side effects at this time. She uses Vyvanse and supplements this  with her Adderall when her children come home.  Review of Systems is negative except per HPI.    Objective:   Physical Exam  Constitutional: Patient is well-developed, well-nourished, and in no distress. HENT: Head is normocephalic and atraumatic.  Mouth/Throat: Oropharynx is clear and moist without erythema or exudates Eyes: Pupils are equal, round, and reactive to light.  Cardiovascular: Normal rate, regular rhythm. Pulmonary/Chest: Effort normal and CTAB.  Neurological: Patient is alert and oriented to person, place, and time.  Skin: 62mm slightly raised lesion located on the underside of the tip of the nose. Area has moderate induration with reddness. Easily mobile and soft 1-2 inch mass felt immediately under the skin of the left arm. No overlying skin changes noted.     Assessment & Plan:  ADHD (attention deficit hyperactivity disorder)  Lipoma of arm  Allergic rhinitis  Skin lesion - Plan: Ambulatory referral to Dermatology  Will try to better manage the patient's allergy symptoms in lieu of further testing. Recommended Flonase as well as antihistamine of choice. Discussed possibly switching to a different antihistamines and if this doesn't work there are other medications that we can use. She will call if she needs further  Regarding the lipomas, as they are benign, recommend leaving them alone for now as they pose no risk of malignancy.   Will refill the patient's ADD medications today. 30 instead of 60 adderall daily.

## 2014-04-15 ENCOUNTER — Emergency Department (HOSPITAL_COMMUNITY)
Admission: EM | Admit: 2014-04-15 | Discharge: 2014-04-15 | Disposition: A | Discharge status: Home / Self Care | Payer: Medicaid Other | Attending: Emergency Medicine | Admitting: Emergency Medicine

## 2014-04-15 ENCOUNTER — Encounter (HOSPITAL_COMMUNITY): Payer: Self-pay | Admitting: Emergency Medicine

## 2014-04-15 ENCOUNTER — Emergency Department (HOSPITAL_COMMUNITY)
Admission: EM | Admit: 2014-04-15 | Discharge: 2014-04-15 | Discharge status: Against Medical Advice | Payer: Medicaid Other | Source: Home / Self Care

## 2014-04-15 DIAGNOSIS — N23 Unspecified renal colic: Secondary | ICD-10-CM

## 2014-04-15 DIAGNOSIS — R110 Nausea: Secondary | ICD-10-CM | POA: Insufficient documentation

## 2014-04-15 DIAGNOSIS — J45909 Unspecified asthma, uncomplicated: Secondary | ICD-10-CM

## 2014-04-15 DIAGNOSIS — Z9889 Other specified postprocedural states: Secondary | ICD-10-CM | POA: Insufficient documentation

## 2014-04-15 DIAGNOSIS — Z87442 Personal history of urinary calculi: Secondary | ICD-10-CM | POA: Insufficient documentation

## 2014-04-15 DIAGNOSIS — F988 Other specified behavioral and emotional disorders with onset usually occurring in childhood and adolescence: Secondary | ICD-10-CM | POA: Insufficient documentation

## 2014-04-15 DIAGNOSIS — Z3202 Encounter for pregnancy test, result negative: Secondary | ICD-10-CM | POA: Insufficient documentation

## 2014-04-15 DIAGNOSIS — Z79899 Other long term (current) drug therapy: Secondary | ICD-10-CM | POA: Insufficient documentation

## 2014-04-15 DIAGNOSIS — R109 Unspecified abdominal pain: Secondary | ICD-10-CM | POA: Insufficient documentation

## 2014-04-15 LAB — COMPREHENSIVE METABOLIC PANEL
ALT: 13 U/L (ref 0–35)
AST: 18 U/L (ref 0–37)
Albumin: 4 g/dL (ref 3.5–5.2)
Alkaline Phosphatase: 79 U/L (ref 39–117)
BUN: 9 mg/dL (ref 6–23)
CO2: 24 mEq/L (ref 19–32)
Calcium: 9.5 mg/dL (ref 8.4–10.5)
Chloride: 103 mEq/L (ref 96–112)
Creatinine, Ser: 0.7 mg/dL (ref 0.50–1.10)
GFR calc Af Amer: >90 mL/min (ref >90)
GFR calc non Af Amer: >90 mL/min (ref >90)
Glucose, Bld: 85 mg/dL (ref 70–99)
Potassium: 4.4 mEq/L (ref 3.7–5.3)
Sodium: 138 mEq/L (ref 137–147)
Total Bilirubin: 0.3 mg/dL (ref 0.3–1.2)
Total Protein: 7.2 g/dL (ref 6.0–8.3)

## 2014-04-15 LAB — URINALYSIS, ROUTINE W REFLEX MICROSCOPIC
Bilirubin Urine: NEGATIVE
Glucose, UA: NEGATIVE mg/dL
Ketones, ur: NEGATIVE mg/dL
Nitrite: NEGATIVE
Protein, ur: NEGATIVE mg/dL
Specific Gravity, Urine: 1.021 (ref 1.005–1.030)
Urobilinogen, UA: 0.2 mg/dL (ref 0.0–1.0)
pH: 6.5 (ref 5.0–8.0)

## 2014-04-15 LAB — CBC WITH DIFFERENTIAL/PLATELET
Basophils Absolute: 0 10*3/uL (ref 0.0–0.1)
Basophils Relative: 0 % (ref 0–1)
Eosinophils Absolute: 0.2 10*3/uL (ref 0.0–0.7)
Eosinophils Relative: 3 % (ref 0–5)
HCT: 42.4 % (ref 36.0–46.0)
Hemoglobin: 14.7 g/dL (ref 12.0–15.0)
Lymphocytes Relative: 34 % (ref 12–46)
Lymphs Abs: 2.5 10*3/uL (ref 0.7–4.0)
MCH: 30.2 pg (ref 26.0–34.0)
MCHC: 34.7 g/dL (ref 30.0–36.0)
MCV: 87.1 fL (ref 78.0–100.0)
Monocytes Absolute: 0.5 10*3/uL (ref 0.1–1.0)
Monocytes Relative: 7 % (ref 3–12)
Neutro Abs: 4 10*3/uL (ref 1.7–7.7)
Neutrophils Relative %: 56 % (ref 43–77)
Platelets: 264 10*3/uL (ref 150–400)
RBC: 4.87 MIL/uL (ref 3.87–5.11)
RDW: 12.9 % (ref 11.5–15.5)
WBC: 7.2 10*3/uL (ref 4.0–10.5)

## 2014-04-15 LAB — URINE MICROSCOPIC-ADD ON

## 2014-04-15 LAB — LIPASE, BLOOD: Lipase: 24 U/L (ref 11–59)

## 2014-04-15 LAB — POC URINE PREG, ED: Preg Test, Ur: NEGATIVE

## 2014-04-15 MED ORDER — SODIUM CHLORIDE 0.9 % IV SOLN
1000.0000 mL | Freq: Once | INTRAVENOUS | Status: AC
  Administered 2014-04-15: 1000 mL via INTRAVENOUS

## 2014-04-15 MED ORDER — SODIUM CHLORIDE 0.9 % IV SOLN
1000.0000 mL | INTRAVENOUS | Status: DC
  Administered 2014-04-15: 1000 mL via INTRAVENOUS

## 2014-04-15 MED ORDER — HYDROMORPHONE HCL PF 1 MG/ML IJ SOLN
1.0000 mg | INTRAMUSCULAR | Status: DC | PRN
  Administered 2014-04-15: 1 mg via INTRAVENOUS
  Filled 2014-04-15: qty 1

## 2014-04-15 MED ORDER — HYDROCODONE-ACETAMINOPHEN 5-325 MG PO TABS: 1.0000 | ORAL_TABLET | ORAL | Status: DC | PRN

## 2014-04-15 MED ORDER — ONDANSETRON HCL 4 MG PO TABS: 4.0000 mg | ORAL_TABLET | Freq: Four times a day (QID) | ORAL | Status: DC

## 2014-04-15 MED ORDER — ONDANSETRON HCL 4 MG/2ML IJ SOLN
4.0000 mg | Freq: Once | INTRAMUSCULAR | Status: AC
  Administered 2014-04-15: 4 mg via INTRAVENOUS
  Filled 2014-04-15: qty 2

## 2014-04-15 NOTE — ED Notes (Signed)
Pt alert and oriented x4. Respirations even and unlabored, bilateral symmetrical rise and fall of chest. Skin warm and dry. In no acute distress. Denies needs.   

## 2014-04-15 NOTE — Discharge Instructions (Signed)
Kidney Stones  Kidney stones (urolithiasis) are deposits that form inside your kidneys. The intense pain is caused by the stone moving through the urinary tract. When the stone moves, the ureter goes into spasm around the stone. The stone is usually passed in the urine.   CAUSES   · A disorder that makes certain neck glands produce too much parathyroid hormone (primary hyperparathyroidism).  · A buildup of uric acid crystals, similar to gout in your joints.  · Narrowing (stricture) of the ureter.  · A kidney obstruction present at birth (congenital obstruction).  · Previous surgery on the kidney or ureters.  · Numerous kidney infections.  SYMPTOMS   · Feeling sick to your stomach (nauseous).  · Throwing up (vomiting).  · Blood in the urine (hematuria).  · Pain that usually spreads (radiates) to the groin.  · Frequency or urgency of urination.  DIAGNOSIS   · Taking a history and physical exam.  · Blood or urine tests.  · CT scan.  · Occasionally, an examination of the inside of the urinary bladder (cystoscopy) is performed.  TREATMENT   · Observation.  · Increasing your fluid intake.  · Extracorporeal shock wave lithotripsy This is a noninvasive procedure that uses shock waves to break up kidney stones.  · Surgery may be needed if you have severe pain or persistent obstruction. There are various surgical procedures. Most of the procedures are performed with the use of small instruments. Only small incisions are needed to accommodate these instruments, so recovery time is minimized.  The size, location, and chemical composition are all important variables that will determine the proper choice of action for you. Talk to your health care provider to better understand your situation so that you will minimize the risk of injury to yourself and your kidney.   HOME CARE INSTRUCTIONS   · Drink enough water and fluids to keep your urine clear or pale yellow. This will help you to pass the stone or stone fragments.  · Strain  all urine through the provided strainer. Keep all particulate matter and stones for your health care provider to see. The stone causing the pain may be as small as a grain of salt. It is very important to use the strainer each and every time you pass your urine. The collection of your stone will allow your health care provider to analyze it and verify that a stone has actually passed. The stone analysis will often identify what you can do to reduce the incidence of recurrences.  · Only take over-the-counter or prescription medicines for pain, discomfort, or fever as directed by your health care provider.  · Make a follow-up appointment with your health care provider as directed.  · Get follow-up X-rays if required. The absence of pain does not always mean that the stone has passed. It may have only stopped moving. If the urine remains completely obstructed, it can cause loss of kidney function or even complete destruction of the kidney. It is your responsibility to make sure X-rays and follow-ups are completed. Ultrasounds of the kidney can show blockages and the status of the kidney. Ultrasounds are not associated with any radiation and can be performed easily in a matter of minutes.  SEEK MEDICAL CARE IF:  · You experience pain that is progressive and unresponsive to any pain medicine you have been prescribed.  SEEK IMMEDIATE MEDICAL CARE IF:   · Pain cannot be controlled with the prescribed medicine.  · You have a fever   or shaking chills.  · The severity or intensity of pain increases over 18 hours and is not relieved by pain medicine.  · You develop a new onset of abdominal pain.  · You feel faint or pass out.  · You are unable to urinate.  MAKE SURE YOU:   · Understand these instructions.  · Will watch your condition.  · Will get help right away if you are not doing well or get worse.  Document Released: 11/08/2005 Document Revised: 07/11/2013 Document Reviewed: 04/11/2013  [employer]® Patient Information ©2014  [employer].

## 2014-04-15 NOTE — ED Notes (Signed)
Pt escorted to discharge window. Pt verbalized understanding discharge instructions. In no acute distress.  Pt given strainer for urine

## 2014-04-15 NOTE — ED Notes (Signed)
Pt reports she feels better and no longer wants to wait for a bed. Leaving AMA

## 2014-04-15 NOTE — ED Notes (Signed)
Pt seen in ED and discharged earlier today. Told to come back in pain increased. Pt reports she took 1 vicodin table at 1145 and 1 tablet at 1245. Pain was 10/10. At present 5/10.

## 2014-04-15 NOTE — ED Provider Notes (Signed)
CSN: [id number]     Arrival date & time 04/15/14  7035 History   First MD Initiated Contact with Patient 04/15/14 [phone]     Chief Complaint  Patient presents with  . Flank Pain    left    HPI Comments: Patient has history of kidney stones. This morning she woke up with pain in the left flank. The pain is sharp and severe and at times radiates towards the left groin. She's felt nauseated but has not vomited. This pain is similar to prior kidney stones although more intense.  Patient is a 33 y.o. female presenting with flank pain. The history is provided by the patient.  Flank Pain This is a new problem. Episode onset: Patient woke up this morning with left flank pain. The problem occurs constantly. The problem has not changed since onset.Pertinent negatives include no chest pain, no abdominal pain, no headaches and no shortness of breath. Nothing aggravates the symptoms. Nothing relieves the symptoms. She has tried nothing for the symptoms.    Past Medical History  Diagnosis Date  . Kidney stones   . ADD (attention deficit disorder)   . Exercise-induced asthma    Past Surgical History  Procedure Laterality Date  . Kidney stone surgery      R--surgery; L lithotripsy.  Dr. Gaynelle Arabian  . Cholecystectomy     No family history on file. History  Substance Use Topics  . Smoking status: Never Smoker   . Smokeless tobacco: Never Used  . Alcohol Use: No     Comment: none   OB History   Grav Para Term Preterm Abortions TAB SAB Ect Mult Living                 Review of Systems  Constitutional: Negative for fever.  Respiratory: Negative for shortness of breath.   Cardiovascular: Negative for chest pain.  Gastrointestinal: Positive for nausea. Negative for vomiting, abdominal pain, diarrhea and constipation.  Genitourinary: Positive for urgency and flank pain. Negative for vaginal bleeding and vaginal discharge.  Skin: Negative for rash.  Neurological: Negative for headaches.  All  other systems reviewed and are negative.     Allergies  Review of patient's allergies indicates no known allergies.  Home Medications   Prior to Admission medications   Medication Sig Start Date End Date Taking? Authorizing Provider  ALBUTEROL SULFATE HFA IN Inhale 2 puffs into the lungs as needed.    Historical Provider, MD  amphetamine-dextroamphetamine (ADDERALL) 10 MG tablet Take 1 tablet (10 mg total) by mouth daily. 03/31/14   Denita Lung, MD  amphetamine-dextroamphetamine (ADDERALL) 10 MG tablet Take 1 tablet (10 mg total) by mouth daily with breakfast. 05/01/14   Denita Lung, MD  amphetamine-dextroamphetamine (ADDERALL) 10 MG tablet Take 1 tablet (10 mg total) by mouth 2 (two) times daily. 05/31/14   Denita Lung, MD  lisdexamfetamine (VYVANSE) 70 MG capsule Take 1 capsule (70 mg total) by mouth every morning. 05/01/14   Denita Lung, MD  lisdexamfetamine (VYVANSE) 70 MG capsule Take 1 capsule (70 mg total) by mouth daily. 05/01/14   Denita Lung, MD  lisdexamfetamine (VYVANSE) 70 MG capsule Take 1 capsule (70 mg total) by mouth daily. 05/31/14   Denita Lung, MD   BP 131/86  Pulse 75  Temp(Src) 98.6 F (37 C) (Oral)  Resp 16  SpO2 100%  LMP 04/05/2014 Physical Exam  Nursing note and vitals reviewed. Constitutional: She appears well-developed and well-nourished. No distress.  HENT:  Head: Normocephalic and atraumatic.  Right Ear: External ear normal.  Left Ear: External ear normal.  Eyes: Conjunctivae are normal. Right eye exhibits no discharge. Left eye exhibits no discharge. No scleral icterus.  Neck: Neck supple. No tracheal deviation present.  Cardiovascular: Normal rate, regular rhythm and intact distal pulses.   Pulmonary/Chest: Effort normal and breath sounds normal. No stridor. No respiratory distress. She has no wheezes. She has no rales.  Abdominal: Soft. Bowel sounds are normal. She exhibits no distension and no mass. There is no tenderness. There is  CVA tenderness (left-sided). There is no rebound and no guarding. No hernia.  Musculoskeletal: She exhibits no edema and no tenderness.  Neurological: She is alert. She has normal strength. No cranial nerve deficit (no facial droop, extraocular movements intact, no slurred speech) or sensory deficit. She exhibits normal muscle tone. She displays no seizure activity. Coordination normal.  Skin: Skin is warm and dry. No rash noted.  Psychiatric: She has a normal mood and affect.    ED Course  Procedures (including critical care time) Labs Review Labs Reviewed  URINALYSIS, ROUTINE W REFLEX MICROSCOPIC - Abnormal; Notable for the following:    Hgb urine dipstick MODERATE (*)    Leukocytes, UA TRACE (*)    All other components within normal limits  URINE MICROSCOPIC-ADD ON - Abnormal; Notable for the following:    Squamous Epithelial / LPF FEW (*)    All other components within normal limits  COMPREHENSIVE METABOLIC PANEL  CBC WITH DIFFERENTIAL  LIPASE, BLOOD  POC URINE PREG, ED    Medications  0.9 %  sodium chloride infusion (1,000 mLs Intravenous New Bag/Given 04/15/14 0844)    Followed by  0.9 %  sodium chloride infusion (1,000 mLs Intravenous New Bag/Given 04/15/14 0844)  HYDROmorphone (DILAUDID) injection 1 mg (1 mg Intravenous Given 04/15/14 0847)  ondansetron (ZOFRAN) injection 4 mg (4 mg Intravenous Given 04/15/14 0840)   0945  patient is feeling much better after treatment in emergency department   MDM   Final diagnoses:  Ureteral colic    Patient's symptoms are consistent with ureteral colic. Her urinalysis does show blood in the urine which is consistent with recurrent kidney stones. Patient symptoms have resolved at this point. She most likely has a recurrent kidney stone. I do not think imaging is necessary at this time considering the radiation exposure of multiple ct scans at her young age,.  I discussed presumptive care with her and the need to follow up with her doctor  and or return to the ED for worsening symtoms.  Will dc home with pain meds, use a urine strainer.    Dorie Rank, MD 04/15/14 1002

## 2014-04-15 NOTE — ED Notes (Signed)
md at bedside  Pt alert and oriented x4. Respirations even and unlabored, bilateral symmetrical rise and fall of chest. Skin warm and dry. In no acute distress. Denies needs.   

## 2014-04-15 NOTE — ED Notes (Signed)
Pt c/o left flank pain that started throughout the night, pt has hx of kidney stones. Pt also c/o nausea.

## 2014-04-16 ENCOUNTER — Encounter: Payer: Self-pay | Admitting: Family Medicine

## 2014-04-16 ENCOUNTER — Ambulatory Visit (INDEPENDENT_AMBULATORY_CARE_PROVIDER_SITE_OTHER): Payer: Medicaid Other | Admitting: Family Medicine

## 2014-04-16 VITALS — BP 110/80 | HR 60 | Wt 156.0 lb

## 2014-04-16 DIAGNOSIS — Z87442 Personal history of urinary calculi: Secondary | ICD-10-CM

## 2014-04-16 DIAGNOSIS — M549 Dorsalgia, unspecified: Secondary | ICD-10-CM

## 2014-04-16 NOTE — Progress Notes (Signed)
   Subjective:    Patient ID: [name], female    DOB: 10/27/1981, 33 y.o.   MRN: [id number]  HPI She is here for recheck. She was seen last night in the emergency room for flank and lower abdominal pain. She has a previous history of renal stones and felt as if this was the same thing. The ER record was reviewed. She apparently did give relief in the emergency room and therefore a CT urogram was not ordered. She was given pain medications. Today she is having no flank pain but is having some lower abdominal pain. She states that this is where her renal stone pains usually start.   Review of Systems     Objective:   Physical Exam Alert and in no distress and not pale appearing. Urinalysis showed slight amount of RBCs but no other symptoms.       Assessment & Plan:  Back pain - Plan: Urinalysis Dipstick  History of renal stone  I discussed the pain issues having had a previous history of stones. Recommend she continue to drink plenty of fluids and take her pain medications. If her symptoms do not completely go away, further urologic evaluation will be necessary. She and her mother expressed understanding of this.

## 2014-04-16 NOTE — Patient Instructions (Signed)
Drink plenty of fluids, take her pain medications use Afrin as needed. If the pain and nausea/vomiting get worse or urine fever, let he know

## 2014-07-01 ENCOUNTER — Telehealth: Payer: Self-pay | Admitting: Family Medicine

## 2014-07-01 MED ORDER — AMPHETAMINE-DEXTROAMPHETAMINE 10 MG PO TABS: 10.0000 mg | ORAL_TABLET | Freq: Every day | ORAL | Status: DC

## 2014-07-01 MED ORDER — LISDEXAMFETAMINE DIMESYLATE 70 MG PO CAPS: 70.0000 mg | ORAL_CAPSULE | Freq: Every day | ORAL | Status: DC

## 2014-07-01 MED ORDER — AMPHETAMINE-DEXTROAMPHETAMINE 10 MG PO TABS
10.0000 mg | ORAL_TABLET | Freq: Every day | ORAL | Status: DC
Start: 2014-07-01 — End: 2014-08-26

## 2014-07-01 MED ORDER — LISDEXAMFETAMINE DIMESYLATE 70 MG PO CAPS: 70.0000 mg | ORAL_CAPSULE | ORAL | Status: DC

## 2014-07-01 NOTE — Telephone Encounter (Signed)
Adderall and Vyvanse renewed

## 2014-07-01 NOTE — Telephone Encounter (Signed)
Pt is requesting refills on vyvance and adderall. She is asking for a 3 month supply. Please call pt at [phone] when ready.

## 2014-07-02 ENCOUNTER — Telehealth: Payer: Self-pay | Admitting: Family Medicine

## 2014-07-02 NOTE — Telephone Encounter (Signed)
Left message for pt, rx is ready.

## 2014-08-26 ENCOUNTER — Inpatient Hospital Stay (HOSPITAL_COMMUNITY)
Admission: AD | Admit: 2014-08-26 | Discharge: 2014-08-26 | Disposition: A | Discharge status: Home / Self Care | Payer: Medicaid Other | Source: Ambulatory Visit | Attending: Obstetrics and Gynecology | Admitting: Obstetrics and Gynecology

## 2014-08-26 ENCOUNTER — Encounter (HOSPITAL_COMMUNITY): Payer: Self-pay | Admitting: *Deleted

## 2014-08-26 DIAGNOSIS — Z3A01 Less than 8 weeks gestation of pregnancy: Secondary | ICD-10-CM | POA: Diagnosis not present

## 2014-08-26 DIAGNOSIS — O200 Threatened abortion: Secondary | ICD-10-CM | POA: Insufficient documentation

## 2014-08-26 DIAGNOSIS — O4691 Antepartum hemorrhage, unspecified, first trimester: Secondary | ICD-10-CM

## 2014-08-26 DIAGNOSIS — O209 Hemorrhage in early pregnancy, unspecified: Secondary | ICD-10-CM

## 2014-08-26 DIAGNOSIS — O26851 Spotting complicating pregnancy, first trimester: Secondary | ICD-10-CM | POA: Diagnosis present

## 2014-08-26 LAB — CBC
HCT: 38.6 % (ref 36.0–46.0)
Hemoglobin: 13.5 g/dL (ref 12.0–15.0)
MCH: 30.5 pg (ref 26.0–34.0)
MCHC: 35 g/dL (ref 30.0–36.0)
MCV: 87.3 fL (ref 78.0–100.0)
Platelets: 251 10*3/uL (ref 150–400)
RBC: 4.42 MIL/uL (ref 3.87–5.11)
RDW: 13.2 % (ref 11.5–15.5)
WBC: 9.9 10*3/uL (ref 4.0–10.5)

## 2014-08-26 LAB — WET PREP, GENITAL
Clue Cells Wet Prep HPF POC: NONE SEEN
Trich, Wet Prep: NONE SEEN
Yeast Wet Prep HPF POC: NONE SEEN

## 2014-08-26 LAB — URINALYSIS, ROUTINE W REFLEX MICROSCOPIC
Bilirubin Urine: NEGATIVE
Glucose, UA: NEGATIVE mg/dL
Ketones, ur: NEGATIVE mg/dL
Leukocytes, UA: NEGATIVE
Nitrite: NEGATIVE
Protein, ur: NEGATIVE mg/dL
Specific Gravity, Urine: <1.005 — ABNORMAL LOW (ref 1.005–1.030)
Urobilinogen, UA: 0.2 mg/dL (ref 0.0–1.0)
pH: 6 (ref 5.0–8.0)

## 2014-08-26 LAB — URINE MICROSCOPIC-ADD ON

## 2014-08-26 LAB — HCG, QUANTITATIVE, PREGNANCY: hCG, Beta Chain, Quant, S: 244 m[IU]/mL — ABNORMAL HIGH (ref <5)

## 2014-08-26 LAB — POCT PREGNANCY, URINE: Preg Test, Ur: POSITIVE — AB

## 2014-08-26 LAB — ABO/RH: ABO/RH(D): O POS

## 2014-08-26 NOTE — MAU Provider Note (Signed)
History     CSN: [id number]  Arrival date and time: 08/26/14 1056   First Provider Initiated Contact with Patient 08/26/14 1232      Chief Complaint  Patient presents with  . Vaginal Bleeding   HPI  [name] is a 33 y.o. female [phone] at [redacted]w[redacted]d who presents with vaginal spotting. The spotting started over the weekend and the patient feels it is becoming more noticeable. She describes the bleeding as light and very dark brown. She denies pain.    OB History   Grav Para Term Preterm Abortions TAB SAB Ect Mult Living   3 3 3       3       Past Medical History  Diagnosis Date  . Kidney stones   . ADD (attention deficit disorder)   . Exercise-induced asthma     Past Surgical History  Procedure Laterality Date  . Kidney stone surgery      R--surgery; L lithotripsy.  Dr. Gaynelle Arabian  . Cholecystectomy      History reviewed. No pertinent family history.  History  Substance Use Topics  . Smoking status: Never Smoker   . Smokeless tobacco: Never Used  . Alcohol Use: No     Comment: none    Allergies: No Known Allergies  Prescriptions prior to admission  Medication Sig Dispense Refill  . diphenhydrAMINE (BENADRYL) 25 MG tablet Take 12.5 mg by mouth every 6 (six) hours as needed.      . Prenatal Vit-Fe Fumarate-FA (PRENATAL MULTIVITAMIN) TABS tablet Take 1 tablet by mouth daily at 12 noon.       Results for orders placed during the hospital encounter of 08/26/14 (from the past 48 hour(s))  URINALYSIS, ROUTINE W REFLEX MICROSCOPIC     Status: Abnormal   Collection Time    08/26/14 11:10 AM      Result Value Ref Range   Color, Urine YELLOW  YELLOW   APPearance CLEAR  CLEAR   Specific Gravity, Urine <1.005 (*) 1.005 - 1.030   pH 6.0  5.0 - 8.0   Glucose, UA NEGATIVE  NEGATIVE mg/dL   Hgb urine dipstick LARGE (*) NEGATIVE   Bilirubin Urine NEGATIVE  NEGATIVE   Ketones, ur NEGATIVE  NEGATIVE mg/dL   Protein, ur NEGATIVE  NEGATIVE mg/dL   Urobilinogen, UA  0.2  0.0 - 1.0 mg/dL   Nitrite NEGATIVE  NEGATIVE   Leukocytes, UA NEGATIVE  NEGATIVE  URINE MICROSCOPIC-ADD ON     Status: Abnormal   Collection Time    08/26/14 11:10 AM      Result Value Ref Range   Squamous Epithelial / LPF FEW (*) RARE   WBC, UA 0-2  <3 WBC/hpf   RBC / HPF 3-6  <3 RBC/hpf  POCT PREGNANCY, URINE     Status: Abnormal   Collection Time    08/26/14 11:42 AM      Result Value Ref Range   Preg Test, Ur POSITIVE (*) NEGATIVE   Comment:            THE SENSITIVITY OF THIS     METHODOLOGY IS >24 mIU/mL  CBC     Status: None   Collection Time    08/26/14 11:55 AM      Result Value Ref Range   WBC 9.9  4.0 - 10.5 K/uL   RBC 4.42  3.87 - 5.11 MIL/uL   Hemoglobin 13.5  12.0 - 15.0 g/dL   HCT 38.6  36.0 - 46.0 %  MCV 87.3  78.0 - 100.0 fL   MCH 30.5  26.0 - 34.0 pg   MCHC 35.0  30.0 - 36.0 g/dL   RDW 13.2  11.5 - 15.5 %   Platelets 251  150 - 400 K/uL  HCG, QUANTITATIVE, PREGNANCY     Status: Abnormal   Collection Time    08/26/14 11:55 AM      Result Value Ref Range   hCG, Beta Chain, Quant, S 244 (*) <5 mIU/mL   Comment:              GEST. AGE      CONC.  (mIU/mL)       <=1 WEEK        5 - 50         2 WEEKS       50 - 500         3 WEEKS       100 - 10,000         4 WEEKS     1,000 - 30,000         5 WEEKS     3,500 - 115,000       6-8 WEEKS     12,000 - 270,000        12 WEEKS     15,000 - 220,000                FEMALE AND NON-PREGNANT FEMALE:         LESS THAN 5 mIU/mL  ABO/RH     Status: None   Collection Time    08/26/14 11:55 AM      Result Value Ref Range   ABO/RH(D) O POS    WET PREP, GENITAL     Status: Abnormal   Collection Time    08/26/14 12:52 PM      Result Value Ref Range   Yeast Wet Prep HPF POC NONE SEEN  NONE SEEN   Trich, Wet Prep NONE SEEN  NONE SEEN   Clue Cells Wet Prep HPF POC NONE SEEN  NONE SEEN   WBC, Wet Prep HPF POC FEW (*) NONE SEEN   Comment: MANY BACTERIA SEEN    Review of Systems  Constitutional: Negative for  fever and chills.  Gastrointestinal: Negative for abdominal pain.  Genitourinary:       + dark red vaginal bleeding    Physical Exam   Blood pressure 128/87, pulse 76, temperature 98 F (36.7 C), temperature source Oral, resp. rate 16, height 5\' 4"  (1.626 m), weight 70.67 kg (155 lb 12.8 oz), SpO2 100.00%.  Physical Exam  Constitutional: She is oriented to person, place, and time. She appears well-developed and well-nourished. No distress.  HENT:  Head: Normocephalic.  Eyes: Pupils are equal, round, and reactive to light.  Neck: Neck supple.  Respiratory: Effort normal.  GI: Soft. She exhibits no distension. There is no tenderness. There is no rebound and no guarding.  Genitourinary:  Speculum exam: Vagina - Small amount of dark red blood in vaginal canal  Cervix -  Small amount of thick, dark blood oozing from the cervix Bimanual exam: Cervix closed, no CMT  Uterus non tender, normal size Adnexa non tender, no masses bilaterally GC/Chlam, wet prep done Chaperone present for exam.   Musculoskeletal: Normal range of motion.  Neurological: She is alert and oriented to person, place, and time.  Skin: Skin is warm. She is not diaphoretic.  Psychiatric: Her behavior is normal.  MAU Course  Procedures None  MDM O positive blood type Urine pregnancy test positive Hcg CBC UA Discussed labs with Dr. Marvel Plan. Pt to return to MAU in 48 hours for repeat beta hcg.   Assessment and Plan   A: 1. Vaginal bleeding in pregnancy, first trimester   2. Threatened miscarriage    P: Discharge home in stable condition Return to MAU in 48 hours for repeat Beta hcg  Pelvic rest Bleeding precautions Return to MAU with worsening symptoms   Darrelyn Hillock [name], NP 08/26/2014 5:24 PM

## 2014-08-26 NOTE — Discharge Instructions (Signed)
Threatened Miscarriage °A threatened miscarriage occurs when you have vaginal bleeding during your first 20 weeks of pregnancy but the pregnancy has not ended. If you have vaginal bleeding during this time, your health care provider will do tests to make sure you are still pregnant. If the tests show you are still pregnant and the developing baby (fetus) inside your womb (uterus) is still growing, your condition is considered a threatened miscarriage. °A threatened miscarriage does not mean your pregnancy will end, but it does increase the risk of losing your pregnancy (complete miscarriage). °CAUSES  °The cause of a threatened miscarriage is usually not known. If you go on to have a complete miscarriage, the most common cause is an abnormal number of chromosomes in the developing baby. Chromosomes are the structures inside cells that hold all your genetic material. °Some causes of vaginal bleeding that do not result in miscarriage include: °· Having sex. °· Having an infection. °· Normal hormone changes of pregnancy. °· Bleeding that occurs when an egg implants in your uterus. °RISK FACTORS °Risk factors for bleeding in early pregnancy include: °· Obesity. °· Smoking. °· Drinking excessive amounts of alcohol or caffeine. °· Recreational drug use. °SIGNS AND SYMPTOMS °· Light vaginal bleeding. °· Mild abdominal pain or cramps. °DIAGNOSIS  °If you have bleeding with or without abdominal pain before 20 weeks of pregnancy, your health care provider will do tests to check whether you are still pregnant. One important test involves using sound waves and a computer (ultrasound) to create images of the inside of your uterus. Other tests include an internal exam of your vagina and uterus (pelvic exam) and measurement of your baby's heart rate.  °You may be diagnosed with a threatened miscarriage if: °· Ultrasound testing shows you are still pregnant. °· Your baby's heart rate is strong. °· A pelvic exam shows that the  opening between your uterus and your vagina (cervix) is closed. °· Your heart rate and blood pressure are stable. °· Blood tests confirm you are still pregnant. °TREATMENT  °No treatments have been shown to prevent a threatened miscarriage from going on to a complete miscarriage. However, the right home care is important.  °HOME CARE INSTRUCTIONS  °· Make sure you keep all your appointments for prenatal care. This is very important. °· Get plenty of rest. °· Do not have sex or use tampons if you have vaginal bleeding. °· Do not douche. °· Do not smoke or use recreational drugs. °· Do not drink alcohol. °· Avoid caffeine. °SEEK MEDICAL CARE IF: °· You have light vaginal bleeding or spotting while pregnant. °· You have abdominal pain or cramping. °· You have a fever. °SEEK IMMEDIATE MEDICAL CARE IF: °· You have heavy vaginal bleeding. °· You have blood clots coming from your vagina. °· You have severe low back pain or abdominal cramps. °· You have fever, chills, and severe abdominal pain. °MAKE SURE YOU: °· Understand these instructions. °· Will watch your condition. °· Will get help right away if you are not doing well or get worse. °Document Released: 11/08/2005 Document Revised: 11/13/2013 Document Reviewed: 09/04/2013 °ExitCare® Patient Information ©2015 [employer]. This information is not intended to replace advice given to you by your health care provider. Make sure you discuss any questions you have with your health care provider. ° °Vaginal Bleeding During Pregnancy, First Trimester °A small amount of bleeding (spotting) from the vagina is relatively common in early pregnancy. It usually stops on its own. Various things may cause bleeding   or spotting in early pregnancy. Some bleeding may be related to the pregnancy, and some may not. In most cases, the bleeding is normal and is not a problem. However, bleeding can also be a sign of something serious. Be sure to tell your health care provider about any  vaginal bleeding right away. °Some possible causes of vaginal bleeding during the first trimester include: °· Infection or inflammation of the cervix. °· Growths (polyps) on the cervix. °· Miscarriage or threatened miscarriage. °· Pregnancy tissue has developed outside of the uterus and in a fallopian tube (tubal pregnancy). °· Tiny cysts have developed in the uterus instead of pregnancy tissue (molar pregnancy). °HOME CARE INSTRUCTIONS  °Watch your condition for any changes. The following actions may help to lessen any discomfort you are feeling: °· Follow your health care provider's instructions for limiting your activity. If your health care provider orders bed rest, you may need to stay in bed and only get up to use the bathroom. However, your health care provider may allow you to continue light activity. °· If needed, make plans for someone to help with your regular activities and responsibilities while you are on bed rest. °· Keep track of the number of pads you use each day, how often you change pads, and how soaked (saturated) they are. Write this down. °· Do not use tampons. Do not douche. °· Do not have sexual intercourse or orgasms until approved by your health care provider. °· If you pass any tissue from your vagina, save the tissue so you can show it to your health care provider. °· Only take over-the-counter or prescription medicines as directed by your health care provider. °· Do not take aspirin because it can make you bleed. °· Keep all follow-up appointments as directed by your health care provider. °SEEK MEDICAL CARE IF: °· You have any vaginal bleeding during any part of your pregnancy. °· You have cramps or labor pains. °· You have a fever, not controlled by medicine. °SEEK IMMEDIATE MEDICAL CARE IF:  °· You have severe cramps in your back or belly (abdomen). °· You pass large clots or tissue from your vagina. °· Your bleeding increases. °· You feel light-headed or weak, or you have fainting  episodes. °· You have chills. °· You are leaking fluid or have a gush of fluid from your vagina. °· You pass out while having a bowel movement. °MAKE SURE YOU: °· Understand these instructions. °· Will watch your condition. °· Will get help right away if you are not doing well or get worse. °Document Released: 08/18/2005 Document Revised: 11/13/2013 Document Reviewed: 07/16/2013 °ExitCare® Patient Information ©2015 [employer]. This information is not intended to replace advice given to you by your health care provider. Make sure you discuss any questions you have with your health care provider. ° °Pelvic Rest °Pelvic rest is sometimes recommended for women when:  °· The placenta is partially or completely covering the opening of the cervix (placenta previa). °· There is bleeding between the uterine wall and the amniotic sac in the first trimester (subchorionic hemorrhage). °· The cervix begins to open without labor starting (incompetent cervix, cervical insufficiency). °· The labor is too early (preterm labor). °HOME CARE INSTRUCTIONS °· Do not have sexual intercourse, stimulation, or an orgasm. °· Do not use tampons, douche, or put anything in the vagina. °· Do not lift anything over 10 pounds (4.5 kg). °· Avoid strenuous activity or straining your pelvic muscles. °SEEK MEDICAL CARE IF:  °· You have   any vaginal bleeding during pregnancy. Treat this as a potential emergency. °· You have cramping pain felt low in the stomach (stronger than menstrual cramps). °· You notice vaginal discharge (watery, mucus, or bloody). °· You have a low, dull backache. °· There are regular contractions or uterine tightening. °SEEK IMMEDIATE MEDICAL CARE IF: °You have vaginal bleeding and have placenta previa.  °Document Released: 03/05/2011 Document Revised: 01/31/2012 Document Reviewed: 03/05/2011 °ExitCare® Patient Information ©2015 [employer]. This information is not intended to replace advice given to you by your health care  provider. Make sure you discuss any questions you have with your health care provider. ° °

## 2014-08-26 NOTE — MAU Note (Signed)
Patient presents to MAU with c/o vaginal bleeding that is noted by patient to be dark brown in color that is noticed when wipes; not currently having to wear a pad. +HPT. Last LMP 8/28. Has had a few days of spotting since last LMP but has not quit. Denies Pain. Patient expressed concern for bleeding and states could not wait until November 1st for first appointment with Erling Conte; also states having some insurance issues.

## 2014-08-27 LAB — GC/CHLAMYDIA PROBE AMP
CT Probe RNA: NEGATIVE
GC Probe RNA: NEGATIVE

## 2014-08-28 ENCOUNTER — Inpatient Hospital Stay (HOSPITAL_COMMUNITY)
Admission: AD | Admit: 2014-08-28 | Discharge: 2014-08-28 | Disposition: A | Discharge status: Home / Self Care | Payer: Medicaid Other | Source: Ambulatory Visit | Attending: Obstetrics and Gynecology | Admitting: Obstetrics and Gynecology

## 2014-08-28 DIAGNOSIS — O4691 Antepartum hemorrhage, unspecified, first trimester: Secondary | ICD-10-CM

## 2014-08-28 DIAGNOSIS — Z3A00 Weeks of gestation of pregnancy not specified: Secondary | ICD-10-CM | POA: Diagnosis not present

## 2014-08-28 DIAGNOSIS — O200 Threatened abortion: Secondary | ICD-10-CM | POA: Insufficient documentation

## 2014-08-28 DIAGNOSIS — O209 Hemorrhage in early pregnancy, unspecified: Secondary | ICD-10-CM

## 2014-08-28 LAB — HCG, QUANTITATIVE, PREGNANCY: hCG, Beta Chain, Quant, S: 366 m[IU]/mL — ABNORMAL HIGH (ref <5)

## 2014-08-28 NOTE — Discharge Instructions (Signed)
Pelvic Rest °Pelvic rest is sometimes recommended for women when:  °· The placenta is partially or completely covering the opening of the cervix (placenta previa). °· There is bleeding between the uterine wall and the amniotic sac in the first trimester (subchorionic hemorrhage). °· The cervix begins to open without labor starting (incompetent cervix, cervical insufficiency). °· The labor is too early (preterm labor). °HOME CARE INSTRUCTIONS °· Do not have sexual intercourse, stimulation, or an orgasm. °· Do not use tampons, douche, or put anything in the vagina. °· Do not lift anything over 10 pounds (4.5 kg). °· Avoid strenuous activity or straining your pelvic muscles. °SEEK MEDICAL CARE IF:  °· You have any vaginal bleeding during pregnancy. Treat this as a potential emergency. °· You have cramping pain felt low in the stomach (stronger than menstrual cramps). °· You notice vaginal discharge (watery, mucus, or bloody). °· You have a low, dull backache. °· There are regular contractions or uterine tightening. °SEEK IMMEDIATE MEDICAL CARE IF: °You have vaginal bleeding and have placenta previa.  °Document Released: 03/05/2011 Document Revised: 01/31/2012 Document Reviewed: 03/05/2011 °ExitCare® Patient Information ©2015 [employer]. This information is not intended to replace advice given to you by your health care provider. Make sure you discuss any questions you have with your health care provider. ° °Vaginal Bleeding During Pregnancy, First Trimester °A small amount of bleeding (spotting) from the vagina is common in early pregnancy. Sometimes the bleeding is normal and is not a problem, and sometimes it is a sign of something serious. Be sure to tell your doctor about any bleeding from your vagina right away. °HOME CARE °· Watch your condition for any changes. °· Follow your doctor's instructions about how active you can be. °· If you are on bed rest: °¨ You may need to stay in bed and only get up to use the  bathroom. °¨ You may be allowed to do some activities. °¨ If you need help, make plans for someone to help you. °· Write down: °¨ The number of pads you use each day. °¨ How often you change pads. °¨ How soaked (saturated) your pads are. °· Do not use tampons. °· Do not douche. °· Do not have sex or orgasms until your doctor says it is okay. °· If you pass any tissue from your vagina, save the tissue so you can show it to your doctor. °· Only take medicines as told by your doctor. °· Do not take aspirin because it can make you bleed. °· Keep all follow-up visits as told by your doctor. °GET HELP IF:  °· You bleed from your vagina. °· You have cramps. °· You have labor pains. °· You have a fever that does not go away after you take medicine. °GET HELP RIGHT AWAY IF:  °· You have very bad cramps in your back or belly (abdomen). °· You pass large clots or tissue from your vagina. °· You bleed more. °· You feel light-headed or weak. °· You pass out (faint). °· You have chills. °· You are leaking fluid or have a gush of fluid from your vagina. °· You pass out while pooping (having a bowel movement). °MAKE SURE YOU: °· Understand these instructions. °· Will watch your condition. °· Will get help right away if you are not doing well or get worse. °Document Released: 03/25/2014 Document Reviewed: 07/16/2013 °ExitCare® Patient Information ©2015 [employer]. This information is not intended to replace advice given to you by your health care provider. Make   sure you discuss any questions you have with your health care provider.

## 2014-08-28 NOTE — MAU Note (Signed)
Patient to MAU for repeat BHCG.  Patient denies pain but has slight bleeding, more than spotting, less than a period.

## 2014-08-28 NOTE — MAU Provider Note (Signed)
S:  [name] is a 33 y.o. female who presents to MAU for a follow up beta hcg level. She was seen 2 days ago for bleeding. She continues to have light vaginal bleeding; denies pain.   O:  GENERAL: Well-developed, well-nourished female in no acute distress.  HEENT: Normocephalic, atraumatic.   LUNGS: Effort normal HEART: Regular rate  SKIN: Warm, dry and without erythema PSYCH: Normal mood and affect  Filed Vitals:   08/28/14 1328  BP: 133/82  Pulse: 81  Resp: 16   Results for orders placed during the hospital encounter of 08/28/14 (from the past 48 hour(s))  HCG, QUANTITATIVE, PREGNANCY     Status: Abnormal   Collection Time    08/28/14 12:58 PM      Result Value Ref Range   hCG, Beta Chain, Quant, S 366 (*) <5 mIU/mL   Comment:              GEST. AGE      CONC.  (mIU/mL)       <=1 WEEK        5 - 50         2 WEEKS       50 - 500         3 WEEKS       100 - 10,000         4 WEEKS     1,000 - 30,000         5 WEEKS     3,500 - 115,000       6-8 WEEKS     12,000 - 270,000        12 WEEKS     15,000 - 220,000                FEMALE AND NON-PREGNANT FEMALE:         LESS THAN 5 mIU/mL     MDM Beta hcg level  Beta hcg level 10/5: 244 Beta hcg level 10/7: 366 Discussed labs with Dr. Melba Coon.  Discussed the need for the patient to return in 48 hours for another beta hcg level. Today's quant is somewhat reassuring d/t a rise of 50%. I discussed sending her for an [place] today, although it is unlikely to see much at this time given the low beta hcg level. The patient agreed to come back here on Friday for repeat labs and to discuss having an [place] that day or 7 days later, depending on the beta.    A: Rise in beta hcg level Bleeding in early pregnancy Threatened miscarriage   P: Return Friday for beta hcg level Return to MAU with worsening symptoms  Darrelyn Hillock Rasch, NP 08/28/2014 2:29 PM

## 2014-08-30 ENCOUNTER — Inpatient Hospital Stay (HOSPITAL_COMMUNITY): Payer: Medicaid Other

## 2014-08-30 ENCOUNTER — Telehealth: Payer: Self-pay | Admitting: Obstetrics and Gynecology

## 2014-08-30 ENCOUNTER — Inpatient Hospital Stay (HOSPITAL_COMMUNITY)
Admission: AD | Admit: 2014-08-30 | Discharge: 2014-08-30 | Disposition: A | Discharge status: Home / Self Care | Payer: Medicaid Other | Source: Ambulatory Visit | Attending: Obstetrics and Gynecology | Admitting: Obstetrics and Gynecology

## 2014-08-30 DIAGNOSIS — O0281 Inappropriate change in quantitative human chorionic gonadotropin (hCG) in early pregnancy: Secondary | ICD-10-CM | POA: Insufficient documentation

## 2014-08-30 DIAGNOSIS — O26851 Spotting complicating pregnancy, first trimester: Secondary | ICD-10-CM | POA: Diagnosis present

## 2014-08-30 LAB — HCG, QUANTITATIVE, PREGNANCY: hCG, Beta Chain, Quant, S: 460 m[IU]/mL — ABNORMAL HIGH (ref <5)

## 2014-08-30 NOTE — Discharge Instructions (Signed)
°Ectopic Pregnancy °An ectopic pregnancy is when the fertilized egg attaches (implants) outside the uterus. Most ectopic pregnancies occur in the fallopian tube. Rarely do ectopic pregnancies occur on the ovary, intestine, pelvis, or cervix. In an ectopic pregnancy, the fertilized egg does not have the ability to develop into a normal, healthy baby.  °A ruptured ectopic pregnancy is one in which the fallopian tube gets torn or bursts and results in internal bleeding. Often there is intense abdominal pain, and sometimes, vaginal bleeding. Having an ectopic pregnancy can be life threatening. If left untreated, this dangerous condition can lead to a blood transfusion, abdominal surgery, or even death. °CAUSES  °Damage to the fallopian tubes is the suspected cause in most ectopic pregnancies.  °RISK FACTORS °Depending on your circumstances, the risk of having an ectopic pregnancy will vary. The level of risk can be divided into three categories. °High Risk °· You have gone through infertility treatment. °· You have had a previous ectopic pregnancy. °· You have had previous tubal surgery. °· You have had previous surgery to have the fallopian tubes tied (tubal ligation). °· You have tubal problems or diseases. °· You have been exposed to DES. DES is a medicine that was used until 1971 and had effects on babies whose mothers took the medicine. °· You become pregnant while using an intrauterine device (IUD) for birth control.  °Moderate Risk °· You have a history of infertility. °· You have a history of a sexually transmitted infection (STI). °· You have a history of pelvic inflammatory disease (PID). °· You have scarring from endometriosis. °· You have multiple sexual partners. °· You smoke.  °Low Risk °· You have had previous pelvic surgery. °· You use vaginal douching. °· You became sexually active before 31 years of age. °SIGNS AND SYMPTOMS  °An ectopic pregnancy should be suspected in anyone who has missed a period  and has abdominal pain or bleeding. °· You may experience normal pregnancy symptoms, such as: °¨ Nausea. °¨ Tiredness. °¨ Breast tenderness. °· Other symptoms may include: °¨ Pain with intercourse. °¨ Irregular vaginal bleeding or spotting. °¨ Cramping or pain on one side or in the lower abdomen. °¨ Fast heartbeat. °¨ Passing out while having a bowel movement. °· Symptoms of a ruptured ectopic pregnancy and internal bleeding may include: °¨ Sudden, severe pain in the abdomen and pelvis. °¨ Dizziness or fainting. °¨ Pain in the shoulder area. °DIAGNOSIS  °Tests that may be performed include: °· A pregnancy test. °· An ultrasound test. °· Testing the specific level of pregnancy hormone in the bloodstream. °· Taking a sample of uterus tissue (dilation and curettage, D&C). °· Surgery to perform a visual exam of the inside of the abdomen using a thin, lighted tube with a tiny camera on the end (laparoscope). °TREATMENT  °An injection of a medicine called methotrexate may be given. This medicine causes the pregnancy tissue to be absorbed. It is given if: °· The diagnosis is made early. °· The fallopian tube has not ruptured. °· You are considered to be a good candidate for the medicine. °Usually, pregnancy hormone blood levels are checked after methotrexate treatment. This is to be sure the medicine is effective. It may take 4-6 weeks for the pregnancy to be absorbed (though most pregnancies will be absorbed by 3 weeks). °Surgical treatment may be needed. A laparoscope may be used to remove the pregnancy tissue. If severe internal bleeding occurs, a cut (incision) may be made in the lower abdomen (laparotomy), and the ectopic   pregnancy is removed. This stops the bleeding. Part of the fallopian tube, or the whole tube, may be removed as well (salpingectomy). After surgery, pregnancy hormone tests may be done to be sure there is no pregnancy tissue left. You may receive a Rho (D) immune globulin shot if you are Rh negative  and the father is Rh positive, or if you do not know the Rh type of the father. This is to prevent problems with any future pregnancy. °SEEK IMMEDIATE MEDICAL CARE IF:  °You have any symptoms of an ectopic pregnancy. This is a medical emergency. °MAKE SURE YOU: °· Understand these instructions. °· Will watch your condition. °· Will get help right away if you are not doing well or get worse. °Document Released: 12/16/2004 Document Revised: 03/25/2014 Document Reviewed: 06/07/2013 °Exit[employer]® Patient Information ©2015 [employer]. This information is not intended to replace advice given to you by your health care provider. Make sure you discuss any questions you have with your health care provider. ° ° °

## 2014-08-30 NOTE — MAU Note (Signed)
Patient to MAU for repeat BHCG. Denies pain but has slight vaginal bleeding on and off.

## 2014-08-30 NOTE — MAU Provider Note (Signed)
S: 33 y.o. [name] @[redacted]w[redacted]d  presents to MAU for repeat quant hcg.  She has light spotting today, which has been off and on since she was first seen in MAU 4 days ago.  She denies abdominal pain, n/v, dizziness, or fever/chills.   O: BP 138/79  Pulse 93  Temp(Src) 98.7 F (37.1 C) (Oral)  Resp 16  SpO2 99%  LMP 07/19/2014 Physical Examination: General appearance - alert, well appearing, and in no distress, oriented to person, place, and time and acyanotic, in no respiratory distress  Results for orders placed during the hospital encounter of 08/30/14 (from the past 168 hour(s))  HCG, QUANTITATIVE, PREGNANCY   Collection Time    08/30/14 12:55 PM      Result Value Ref Range   hCG, Beta Chain, Quant, S 460 (*) <5 mIU/mL  Results for orders placed during the hospital encounter of 08/28/14 (from the past 168 hour(s))  HCG, QUANTITATIVE, PREGNANCY   Collection Time    08/28/14 12:58 PM      Result Value Ref Range   hCG, Beta Chain, Quant, S 366 (*) <5 mIU/mL  HCG, QUANTITATIVE, PREGNANCY   Collection Time    08/26/14 11:55 AM      Result Value Ref Range   hCG, Beta Chain, Quant, S 244 (*) <5 mIU/mL    A:  1. Inappropriate change in quantitative hCG in early pregnancy    P: Consult Dr Marvel Plan Pt has to leave MAU to pick up her kids, will return for follow up u/s Ectopic precautions given  [name] Certified Nurse-Midwife  Addendum: Ultrasound: US Ob Comp Less 14 Wks  08/30/2014   CLINICAL DATA:  Inappropriately rising quantitative beta HCG levels in early pregnancy.  EXAM: OBSTETRIC <14 WK [place] AND TRANSVAGINAL OB US  TECHNIQUE: Both transabdominal and transvaginal ultrasound examinations were performed for complete evaluation of the gestation as well as the maternal uterus, adnexal regions, and pelvic cul-de-sac. Transvaginal technique was performed to assess early pregnancy.  COMPARISON:  None.  FINDINGS: Intrauterine gestational sac: Probable single early  intrauterine gestational sac.  Yolk sac:  Not visualized  Embryo:  Not visualized  MSD:  3  mm   4 w   5  d  Maternal uterus/adnexae: Both ovaries are normal in appearance. No adnexal mass or free fluid identified.  IMPRESSION: Probable early intrauterine gestational sac, but no yolk sac, fetal pole, or cardiac activity yet visualized. Recommend follow-up quantitative B-HCG levels and follow-up US in 14 days to confirm and assess viability. This recommendation follows SRU consensus guidelines: Diagnostic Criteria for Nonviable Pregnancy Early in the First Trimester. [employer] Med 2013; 263:7858-85.   Electronically Signed   By: Earle Gell M.D.   On: 08/30/2014 17:15   US Ob Transvaginal  08/30/2014   CLINICAL DATA:  Inappropriately rising quantitative beta HCG levels in early pregnancy.  EXAM: OBSTETRIC <14 WK [place] AND TRANSVAGINAL OB US  TECHNIQUE: Both transabdominal and transvaginal ultrasound examinations were performed for complete evaluation of the gestation as well as the maternal uterus, adnexal regions, and pelvic cul-de-sac. Transvaginal technique was performed to assess early pregnancy.  COMPARISON:  None.  FINDINGS: Intrauterine gestational sac: Probable single early intrauterine gestational sac.  Yolk sac:  Not visualized  Embryo:  Not visualized  MSD:  3  mm   4 w   5  d  Maternal uterus/adnexae: Both ovaries are normal in appearance. No adnexal mass or free fluid identified.  IMPRESSION: Probable early intrauterine gestational sac,  but no yolk sac, fetal pole, or cardiac activity yet visualized. Recommend follow-up quantitative B-HCG levels and follow-up US in 14 days to confirm and assess viability. This recommendation follows SRU consensus guidelines: Diagnostic Criteria for Nonviable Pregnancy Early in the First Trimester. [employer] 2013; 491:7915-05.   Electronically Signed   By: Earle Gell M.D.   On: 08/30/2014 17:15   A: Gestational sac only on U/S Inappropriate rise in quant hcg in  early pregnancy  P: Discussed u/s result and options with pt including repeat quant in 48 hours or MTX therapy today Dr Marvel Plan to bedside to continue discussion options with pt  [name] Certified Nurse-Midwife  Pt seen 08/30/14 at MAU for abnormally rising HCG.  10/5 244 10/7 366 and 10/9 460.  Pt having no pain and only vaginal spotting.  US performed 10/9 showed only a small gestational sac in uterus about 4-5 weeks size and no yolk sac of fetal pole.  Nothing in adnexa.  Pt with LMP 8/27 and regular cycles. +UPT 2 weeks ago.  d/w pt this likely represents a failed IUP but cannot r/o ectopic pregnancy completely and should continue to follow closely.  d/w pt methotrexate if levels keep rising abnormally. Pt declines intervention for now with MTX which is reasonable.   Ectopic precautions reviewed, but failed IUP seems more likely.  Will return to MAU 10/12 for f/u beta and determine if further treatment warranted at that time.

## 2014-08-30 NOTE — Telephone Encounter (Signed)
error 

## 2014-09-02 ENCOUNTER — Inpatient Hospital Stay (HOSPITAL_COMMUNITY)
Admission: AD | Admit: 2014-09-02 | Discharge: 2014-09-02 | Disposition: A | Discharge status: Home / Self Care | Payer: Medicaid Other | Source: Ambulatory Visit | Attending: Obstetrics and Gynecology | Admitting: Obstetrics and Gynecology

## 2014-09-02 DIAGNOSIS — O0281 Inappropriate change in quantitative human chorionic gonadotropin (hCG) in early pregnancy: Secondary | ICD-10-CM | POA: Insufficient documentation

## 2014-09-02 LAB — HCG, QUANTITATIVE, PREGNANCY: hCG, Beta Chain, Quant, S: 754 m[IU]/mL — ABNORMAL HIGH (ref <5)

## 2014-09-02 NOTE — MAU Note (Signed)
Pt states here for repeat BHCG. Denies pain or bleeding.

## 2014-09-02 NOTE — MAU Provider Note (Signed)
S:  [name] is a 33 y.o. female [phone] at [redacted]w[redacted]d who presents for a follow up beta hcg level. She has been following closely in MAU for inappropriate  rise in Beta hcg levels with a very wanted pregnancy. She denies pain or bleeding at this time.    O:  GENERAL: Well-developed, well-nourished female in no acute distress.  HEENT: Normocephalic, atraumatic.   LUNGS: Effort normal HEART: Regular rate  SKIN: Warm, dry and without erythema PSYCH: Normal mood and affect  Filed Vitals:   09/02/14 0910  BP: 126/82  Pulse: 80  Temp: 97.7 F (36.5 C)  Resp: 16   Results for orders placed during the hospital encounter of 09/02/14 (from the past 48 hour(s))  HCG, QUANTITATIVE, PREGNANCY     Status: Abnormal   Collection Time    09/02/14  8:55 AM      Result Value Ref Range   hCG, Beta Chain, Quant, S 754 (*) <5 mIU/mL   Comment:              GEST. AGE      CONC.  (mIU/mL)       <=1 WEEK        5 - 50         2 WEEKS       50 - 500         3 WEEKS       100 - 10,000         4 WEEKS     1,000 - 30,000         5 WEEKS     3,500 - 115,000       6-8 WEEKS     12,000 - 270,000        12 WEEKS     15,000 - 220,000                FEMALE AND NON-PREGNANT FEMALE:         LESS THAN 5 mIU/mL     CLINICAL DATA: Inappropriately rising quantitative beta HCG levels  in early pregnancy.  EXAM:  OBSTETRIC <14 WK [place] AND TRANSVAGINAL OB US  TECHNIQUE:  Both transabdominal and transvaginal ultrasound examinations were  performed for complete evaluation of the gestation as well as the  maternal uterus, adnexal regions, and pelvic cul-de-sac.  Transvaginal technique was performed to assess early pregnancy.  COMPARISON: None.  FINDINGS:  Intrauterine gestational sac: Probable single early intrauterine  gestational sac.  Yolk sac: Not visualized  Embryo: Not visualized  MSD: 3 mm 4 w 5 d  Maternal uterus/adnexae: Both ovaries are normal in appearance. No  adnexal mass or free fluid  identified.  IMPRESSION:  Probable early intrauterine gestational sac, but no yolk sac, fetal  pole, or cardiac activity yet visualized. Recommend follow-up  quantitative B-HCG levels and follow-up US in 14 days to confirm and  assess viability. This recommendation follows SRU consensus  guidelines: Diagnostic Criteria for Nonviable Pregnancy Early in the  First Trimester. [employer] Med 2013; 956:2130-86.     MDM: 10/5 beta hcg: 244 10/7 beta hcg: 366 10/9 beta hcg: 460 10/12 beta hcg 754 Discussed beta hcg levels with Dr. Ulanda Edison. Have patient return to MAU in 48 hours for repeat beta hcg level   A: 1. Inappropriate change in quantitative hCG in early pregnancy    P: Discharge home in stable condition Return to [facility] with any increased pain or bleeding Pelvic rest Ectopic precautions  Return to MAU in 48 hours for repeat hcg      [place], NP 09/02/2014 2:20 PM

## 2014-09-04 ENCOUNTER — Inpatient Hospital Stay (HOSPITAL_COMMUNITY)
Admission: AD | Admit: 2014-09-04 | Discharge: 2014-09-04 | Disposition: A | Discharge status: Home / Self Care | Payer: Medicaid Other | Source: Ambulatory Visit | Attending: Obstetrics and Gynecology | Admitting: Obstetrics and Gynecology

## 2014-09-04 DIAGNOSIS — O0281 Inappropriate change in quantitative human chorionic gonadotropin (hCG) in early pregnancy: Secondary | ICD-10-CM | POA: Insufficient documentation

## 2014-09-04 DIAGNOSIS — O200 Threatened abortion: Secondary | ICD-10-CM | POA: Diagnosis not present

## 2014-09-04 DIAGNOSIS — Z3A01 Less than 8 weeks gestation of pregnancy: Secondary | ICD-10-CM | POA: Insufficient documentation

## 2014-09-04 DIAGNOSIS — E349 Endocrine disorder, unspecified: Secondary | ICD-10-CM

## 2014-09-04 LAB — HCG, QUANTITATIVE, PREGNANCY: hCG, Beta Chain, Quant, S: 923 m[IU]/mL — ABNORMAL HIGH (ref <5)

## 2014-09-04 NOTE — MAU Note (Signed)
Pt here for repeat BHCG, pt denies pain or bleeding.

## 2014-09-04 NOTE — MAU Provider Note (Signed)
[name] 33 y.o. [id number] @[redacted]w[redacted]d  presents to MAU for her 5th follow up HCG level.  She initially reported to MAU of 10/5 with complaint of vaginal bleeding.  Her quants have been followed roughly every 48 hours since then.  Today she denies abdominal pain, vaginal bleeding or other symptomatic complaint.  She has a lapse in insurance that has prompted her to be seen in MAU rather than following up in her private office.  She is a regular patient of Dr. Melba Coon.    O:  10/5 beta hcg: 244  10/7 beta hcg: 366  10/9 beta hcg: 460  10/12 beta hcg 754 10/14: beta hcg 923  Discussion with Dr. Willis Modena: he is notified of the above information including lack of insurance and concern of $.  He advises for the patient to come in to the office to see Dr. Melba Coon.   A: Inappropriate rise in HCG level, threatened miscarriage  P: Discharge to home. Pt to see Dr. Melba Coon in office asap. Ectopic precautions reviewed.  Return to MAU for any emergency including abdominal pain, vaginal bleeding, fever, etc.

## 2014-09-12 ENCOUNTER — Inpatient Hospital Stay (HOSPITAL_COMMUNITY): Payer: Medicaid Other

## 2014-09-12 ENCOUNTER — Inpatient Hospital Stay (HOSPITAL_COMMUNITY)
Admission: AD | Admit: 2014-09-12 | Discharge: 2014-09-13 | Disposition: A | Discharge status: Home / Self Care | Payer: Medicaid Other | Source: Ambulatory Visit | Attending: Obstetrics & Gynecology | Admitting: Obstetrics & Gynecology

## 2014-09-12 DIAGNOSIS — Z3A01 Less than 8 weeks gestation of pregnancy: Secondary | ICD-10-CM | POA: Insufficient documentation

## 2014-09-12 DIAGNOSIS — O009 Unspecified ectopic pregnancy without intrauterine pregnancy: Secondary | ICD-10-CM

## 2014-09-12 DIAGNOSIS — O0281 Inappropriate change in quantitative human chorionic gonadotropin (hCG) in early pregnancy: Secondary | ICD-10-CM

## 2014-09-12 DIAGNOSIS — O26891 Other specified pregnancy related conditions, first trimester: Secondary | ICD-10-CM | POA: Diagnosis present

## 2014-09-12 LAB — CBC WITH DIFFERENTIAL/PLATELET
Basophils Absolute: 0 10*3/uL (ref 0.0–0.1)
Basophils Relative: 0 % (ref 0–1)
Eosinophils Absolute: 0.2 10*3/uL (ref 0.0–0.7)
Eosinophils Relative: 2 % (ref 0–5)
HCT: 38.9 % (ref 36.0–46.0)
Hemoglobin: 13.1 g/dL (ref 12.0–15.0)
Lymphocytes Relative: 45 % (ref 12–46)
Lymphs Abs: 3.6 10*3/uL (ref 0.7–4.0)
MCH: 29.8 pg (ref 26.0–34.0)
MCHC: 33.7 g/dL (ref 30.0–36.0)
MCV: 88.6 fL (ref 78.0–100.0)
Monocytes Absolute: 0.6 10*3/uL (ref 0.1–1.0)
Monocytes Relative: 7 % (ref 3–12)
Neutro Abs: 3.7 10*3/uL (ref 1.7–7.7)
Neutrophils Relative %: 46 % (ref 43–77)
Platelets: 281 10*3/uL (ref 150–400)
RBC: 4.39 MIL/uL (ref 3.87–5.11)
RDW: 13.6 % (ref 11.5–15.5)
WBC: 8.1 10*3/uL (ref 4.0–10.5)

## 2014-09-12 LAB — HCG, QUANTITATIVE, PREGNANCY: hCG, Beta Chain, Quant, S: 961 m[IU]/mL — ABNORMAL HIGH (ref <5)

## 2014-09-12 LAB — CREATININE, SERUM
Creatinine, Ser: 0.7 mg/dL (ref 0.50–1.10)
GFR calc Af Amer: >90 mL/min (ref >90)
GFR calc non Af Amer: >90 mL/min (ref >90)

## 2014-09-12 LAB — AST: AST: 20 U/L (ref 0–37)

## 2014-09-12 LAB — BUN: BUN: 16 mg/dL (ref 6–23)

## 2014-09-12 MED ORDER — METHOTREXATE INJECTION FOR WOMEN'S HOSPITAL
50.0000 mg/m2 | Freq: Once | INTRAMUSCULAR | Status: AC
  Administered 2014-09-12: 90 mg via INTRAMUSCULAR
  Filled 2014-09-12: qty 1.8

## 2014-09-12 NOTE — MAU Provider Note (Signed)
[name] is a 33 y.o. [phone] at [redacted]w[redacted]d who presents to MAU today for follow-up quant hCG. The patient has been followed for inappropriate rising quant hCGs over the last 2 weeks. [place] on 08/30/14 showed possible early IUGS only. Patient denies abdominal pain. She states that when first arrived in MAU tonight she had no bleeding, she noted spotting with wiping just prior to [place] tonight.   BP 145/89  Pulse 82  Temp(Src) 99.5 F (37.5 C) (Oral)  Resp 16  Ht 5\' 3"  (1.6 m)  Wt 153 lb 8 oz (69.627 kg)  BMI 27.20 kg/m2  SpO2 100%  LMP 07/19/2014 GENERAL: Well-developed, well-nourished female in no acute distress.  HEENT: Normocephalic, atraumatic.   LUNGS: Effort normal HEART: Regular rate  SKIN: Warm, dry and without erythema PSYCH: Normal mood and affect  Results for CHARNIECE, VENTURINO (MRN [id number]) as of 09/12/2014 22:57  Ref. Range 08/28/2014 12:58 08/30/2014 12:55 08/30/2014 17:11 09/02/2014 08:55 09/04/2014 08:22 09/12/2014 19:27  hCG, Beta Chain, Quant, S Latest Range: <5 mIU/mL 366 (H) 460 (H)  754 (H) 923 (H) 961 (H)    MDM Discussed patient with Dr. Harolyn Rutherford. Recommends counsel patient on MTX at this time for presumed ectopic since pregnancy of unknown location with continued abnormally rising quant hCGs Discussed results of [place] and counseled patient on MTX today. She agrees to MTX at this time. Care after instructions discussed.  MTX labs ordered and WNL. MTX ordered and given in MAU today.   A: Pregnancy of unknown location s/p MTX  P: Discharge home Patient to return to MAU on 09/15/14 for follow-up day #4 quant hCG Ectopic precautions discussed and patient voiced understanding Patient may return to MAU as needed or if her condition were to change or worsen  Luvenia Redden, PA-C  09/12/2014 11:01 PM

## 2014-09-12 NOTE — MAU Provider Note (Signed)

## 2014-09-12 NOTE — MAU Note (Signed)
Patient state she had been follow for abnormally rising BHCG's. States she has not been into the office due to financial reasons but plans to switch to [facility] in a couple of weeks when her insurance kicks in. Patient denies pain or bleeding. Would like a follow up BHCG.

## 2014-09-12 NOTE — Discharge Instructions (Signed)
Methotrexate Treatment for an Ectopic Pregnancy, Care After °Refer to this sheet in the next few weeks. These instructions provide you with information on caring for yourself after your procedure. Your health care provider may also give you more specific instructions. Your treatment has been planned according to current medical practices, but problems sometimes occur. Call your health care provider if you have any problems or questions after your procedure. °WHAT TO EXPECT AFTER THE PROCEDURE °You may have some abdominal cramping, vaginal bleeding, and fatigue in the first few days after taking methotrexate. Some other possible side effects of methotrexate include: °· Nausea. °· Vomiting. °· Diarrhea. °· Mouth sores. °· Swelling or irritation of the lining of your lungs (pneumonitis). °· Liver damage. °· Hair loss. °HOME CARE INSTRUCTIONS  °After you have received the methotrexate medicine, you need to be careful of your activities and watch your condition for several weeks. It may take 1 week before your hormone levels return to normal. °· Keep all follow-up appointments as directed by your health care provider. °· Avoid traveling too far away from your health care provider. °· Do not have sexual intercourse until your health care provider says it is safe to do so. °· You may resume your usual diet. °· Limit strenuous activity. °· Do not take folic acid, prenatal vitamins, or other vitamins that contain folic acid. °· Do not take aspirin, ibuprofen, or naproxen (nonsteroidal anti-inflammatory drugs [NSAIDs]). °· Do not drink alcohol. °SEEK MEDICAL CARE IF:  °· You cannot control your nausea and vomiting. °· You cannot control your diarrhea. °· You have sores in your mouth and want treatment. °· You need pain medicine for your abdominal pain. °· You have a rash. °· You are having a reaction to the medicine. °SEEK IMMEDIATE MEDICAL CARE IF:  °· You have increasing abdominal or pelvic pain. °· You notice increased  bleeding. °· You feel light-headed, or you faint. °· You have shortness of breath. °· Your heart rate increases. °· You have a cough. °· You have chills. °· You have a fever. °Document Released: 10/28/2011 Document Revised: 11/13/2013 Document Reviewed: 08/27/2013 °ExitCare® Patient Information ©2015 [employer]. This information is not intended to replace advice given to you by your health care provider. Make sure you discuss any questions you have with your health care provider. ° °

## 2014-09-15 ENCOUNTER — Inpatient Hospital Stay (HOSPITAL_COMMUNITY)
Admission: AD | Admit: 2014-09-15 | Discharge: 2014-09-15 | Disposition: A | Discharge status: Home / Self Care | Payer: Medicaid Other | Source: Ambulatory Visit | Attending: Obstetrics and Gynecology | Admitting: Obstetrics and Gynecology

## 2014-09-15 DIAGNOSIS — Z3A17 17 weeks gestation of pregnancy: Secondary | ICD-10-CM | POA: Insufficient documentation

## 2014-09-15 DIAGNOSIS — O009 Unspecified ectopic pregnancy without intrauterine pregnancy: Secondary | ICD-10-CM

## 2014-09-15 LAB — HCG, QUANTITATIVE, PREGNANCY: hCG, Beta Chain, Quant, S: 715 m[IU]/mL — ABNORMAL HIGH (ref <5)

## 2014-09-15 NOTE — MAU Provider Note (Signed)
CC: No chief complaint on file.    None     HPI YENI JIGGETTS is a 33 y.o. [phone]  who presents for follow-up quantitative beta hCG on day 4 post-methotrexate. She been followed since 08/28/2014 with an appropriate rise in quantitative beta hCG as well as some vaginal spotting. No evidence intrauterine pregnancy or ectopic on ultrasound of 09/12/2014. No abdominal pain or vaginal bleeding per RN LMP 07/19/14.  Past Medical History  Diagnosis Date  . Kidney stones   . ADD (attention deficit disorder)   . Exercise-induced asthma     OB History  Gravida Para Term Preterm AB SAB TAB Ectopic Multiple Living  4 3 3       3     # Outcome Date GA Lbr Len/2nd Weight Sex Delivery Anes PTL Lv  4 CUR           3 TRM      LTCS   Y  2 TRM      LTCS   Y  1 TRM      LTCS   Y      Past Surgical History  Procedure Laterality Date  . Kidney stone surgery      R--surgery; L lithotripsy.  Dr. Gaynelle Arabian  . Cholecystectomy      History   Social History  . Marital Status: Married    Spouse Name: N/A    Number of Children: N/A  . Years of Education: N/A   Occupational History  . Not on file.   Social History Main Topics  . Smoking status: Never Smoker   . Smokeless tobacco: Never Used  . Alcohol Use: No     Comment: none  . Drug Use: No  . Sexual Activity: Yes    Partners: Male    Birth Control/ Protection: None   Other Topics Concern  . Not on file   Social History Narrative  . No narrative on file    No current facility-administered medications on file prior to encounter.   Current Outpatient Prescriptions on File Prior to Encounter  Medication Sig Dispense Refill  . diphenhydrAMINE (BENADRYL) 25 MG tablet Take 12.5 mg by mouth every 6 (six) hours as needed.      . Prenatal Vit-Fe Fumarate-FA (PRENATAL MULTIVITAMIN) TABS tablet Take 1 tablet by mouth daily at 12 noon.        No Known Allergies  ROS Pertinent items in HPI  PHYSICAL EXAM There were no vitals  filed for this visit. General: Well nourished, well developed female in no acute distress Cardiovascular: Normal rate Respiratory: Normal effort Abdomen: Soft, nontender Back: No CVAT Extremities: No edema Neurologic: Alert and oriented Speculum exam: NEFG; vagina with physiologic discharge, no blood; cervix clean Bimanual exam: cervix closed, no CMT; uterus NSSP; no adnexal tenderness or masses   LAB RESULTS Results for orders placed during the hospital encounter of 09/15/14 (from the past 24 hour(s))  HCG, QUANTITATIVE, PREGNANCY     Status: Abnormal   Collection Time    09/15/14  2:14 PM      Result Value Ref Range   hCG, Beta Chain, Quant, S 715 (*) <5 mIU/mL   Results for TREA, CARNEGIE (MRN [id number]) as of 09/15/2014 14:09  Ref. Range 08/30/2014 17:11 09/02/2014 08:55 09/04/2014 08:22 09/12/2014 19:27 09/12/2014 21:08  hCG, Beta Chain, Quant, S Latest Range: <5 mIU/mL  754 (H) 923 (H) 961 (H)    IMAGING US Ob Comp Less 14 Wks  08/30/2014  CLINICAL DATA:  Inappropriately rising quantitative beta HCG levels in early pregnancy.  EXAM: OBSTETRIC <14 WK [place] AND TRANSVAGINAL OB US  TECHNIQUE: Both transabdominal and transvaginal ultrasound examinations were performed for complete evaluation of the gestation as well as the maternal uterus, adnexal regions, and pelvic cul-de-sac. Transvaginal technique was performed to assess early pregnancy.  COMPARISON:  None.  FINDINGS: Intrauterine gestational sac: Probable single early intrauterine gestational sac.  Yolk sac:  Not visualized  Embryo:  Not visualized  MSD:  3  mm   4 w   5  d  Maternal uterus/adnexae: Both ovaries are normal in appearance. No adnexal mass or free fluid identified.  IMPRESSION: Probable early intrauterine gestational sac, but no yolk sac, fetal pole, or cardiac activity yet visualized. Recommend follow-up quantitative B-HCG levels and follow-up US in 14 days to confirm and assess viability. This recommendation follows SRU  consensus guidelines: Diagnostic Criteria for Nonviable Pregnancy Early in the First Trimester. [employer] Med 2013; 253:6644-03.   Electronically Signed   By: Earle Gell M.D.   On: 08/30/2014 17:15   US Ob Transvaginal  09/12/2014   CLINICAL DATA:  In appropriate arises in quantitative beta HCG. Estimated gestational age by LMP is 7 weeks 6 days. Quantitative beta HCG currently reported 474 compared with 923 on 10/14, 754 on 10/12, at 460 on 10/09.  EXAM: TRANSVAGINAL OB ULTRASOUND  TECHNIQUE: Transvaginal ultrasound was performed for complete evaluation of the gestation as well as the maternal uterus, adnexal regions, and pelvic cul-de-sac.  COMPARISON:  08/30/2014  FINDINGS: Intrauterine gestational sac: No intrauterine gestational sac identified.  Yolk sac:  Not identified  Embryo:  Not identified  Cardiac Activity: Not identified  Maternal uterus/adnexae: Uterus and endometrial appear normal and homogeneous. Small amount of fluid in the endocervical canal. Small nabothian cysts in the cervix. Both ovaries are identified. Right ovary measures 2.9 x 1.8 x 2.4 cm and contains normal-appearing follicular changes. Left ovary measures 3.7 x 2.2 x 2.6 cm and contains normal-appearing follicular changes. Small hemorrhagic sister follicle likely representing corpus luteum. No abnormal adnexal masses. No free pelvic fluid collections.  IMPRESSION: No intrauterine gestational sac is identified. Findings meet definitive criteria for failed pregnancy. This follows SRU consensus guidelines: Diagnostic Criteria for Nonviable Pregnancy Early in the First Trimester. Alison Stalling J Med [phone].   Electronically Signed   By: Lucienne Capers M.D.   On: 09/12/2014 21:18   US Ob Transvaginal  08/30/2014   CLINICAL DATA:  Inappropriately rising quantitative beta HCG levels in early pregnancy.  EXAM: OBSTETRIC <14 WK [place] AND TRANSVAGINAL OB US  TECHNIQUE: Both transabdominal and transvaginal ultrasound examinations were  performed for complete evaluation of the gestation as well as the maternal uterus, adnexal regions, and pelvic cul-de-sac. Transvaginal technique was performed to assess early pregnancy.  COMPARISON:  None.  FINDINGS: Intrauterine gestational sac: Probable single early intrauterine gestational sac.  Yolk sac:  Not visualized  Embryo:  Not visualized  MSD:  3  mm   4 w   5  d  Maternal uterus/adnexae: Both ovaries are normal in appearance. No adnexal mass or free fluid identified.  IMPRESSION: Probable early intrauterine gestational sac, but no yolk sac, fetal pole, or cardiac activity yet visualized. Recommend follow-up quantitative B-HCG levels and follow-up US in 14 days to confirm and assess viability. This recommendation follows SRU consensus guidelines: Diagnostic Criteria for Nonviable Pregnancy Early in the First Trimester. [employer] Med 2013; 295:1884-16.   Electronically Signed  By: Earle Gell M.D.   On: 08/30/2014 17:15    MAU COURSE Patient left before result back. Called her with result.  ASSESSMENT  1. Ectopic pregnancy     PLAN Discharged home. Patient has ectopic precautions.    Medication List         diphenhydrAMINE 25 MG tablet  Commonly known as:  BENADRYL  Take 12.5 mg by mouth every 6 (six) hours as needed.     prenatal multivitamin Tabs tablet  Take 1 tablet by mouth daily at 12 noon.       Follow-up Information   Follow up with Nursepractioner Mau, NP On 09/18/2014.        Lorene Dy, CNM 09/15/2014 2:14 PM

## 2014-09-15 NOTE — Discharge Instructions (Signed)
Methotrexate Treatment for an Ectopic Pregnancy °Methotrexate is a medicine that treats ectopic pregnancy by stopping the growth of the fertilized egg. It also helps your body absorb tissue from the egg. This takes between 2 weeks and 6 weeks. Most ectopic pregnancies can be successfully treated with methotrexate if they are detected early enough. °LET YOUR HEALTH CARE PROVIDER KNOW ABOUT: °· Any allergies you have. °· All medicines you are taking, including vitamins, herbs, eye drops, creams, and over-the-counter medicines. °· Medical conditions you have. °RISKS AND COMPLICATIONS °Generally, this is a safe treatment. However, as with any treatment, problems can occur. Possible problems or side effects include: °· Nausea. °· Vomiting. °· Diarrhea. °· Abdominal cramping. °· Mouth sores. °· Increased vaginal bleeding or spotting.   °· Swelling or irritation of the lining of your lungs (pneumonitis).  °· Failed treatment and continuation of the pregnancy.   °· Liver damage. °· Hair loss. °There is still a risk of the ectopic pregnancy rupturing while using the methotrexate. °BEFORE THE PROCEDURE °Before you take the medicine:  °· Liver tests, kidney tests, and a complete blood test are performed. °· Blood tests are performed to measure the pregnancy hormone levels and to determine your blood type. °· If you are Rh-negative and the father is Rh-positive or his Rh type is not known, you will be given a Rho (D) immune globulin shot. °PROCEDURE  °There are two methods that your health care provider may use to prescribe methotrexate. One method involves a single dose or injection of the medicine. Another method involves a series of doses given through several injections.  °AFTER THE PROCEDURE °· You may have some abdominal cramping, vaginal bleeding, and fatigue in the first few days after taking methotrexate. °· Blood tests will be taken for several weeks to check the pregnancy hormone levels. The blood tests are performed  until there is no more pregnancy hormone detected in the blood. °Document Released: 11/02/2001 Document Revised: 03/25/2014 Document Reviewed: 08/27/2013 °ExitCare® Patient Information ©2015 [employer]. This information is not intended to replace advice given to you by your health care provider. Make sure you discuss any questions you have with your health care provider. ° °

## 2014-09-15 NOTE — MAU Provider Note (Signed)

## 2014-09-15 NOTE — MAU Note (Signed)
Pt presents to MAU for repeat labs, denies any pain or vaginal bleeding. Requests to leave and have results called to her

## 2014-09-18 ENCOUNTER — Inpatient Hospital Stay (HOSPITAL_COMMUNITY)
Admission: AD | Admit: 2014-09-18 | Discharge: 2014-09-18 | Disposition: A | Discharge status: Home / Self Care | Payer: Medicaid Other | Source: Ambulatory Visit | Attending: Obstetrics & Gynecology | Admitting: Obstetrics & Gynecology

## 2014-09-18 DIAGNOSIS — Z3A08 8 weeks gestation of pregnancy: Secondary | ICD-10-CM | POA: Diagnosis not present

## 2014-09-18 DIAGNOSIS — O039 Complete or unspecified spontaneous abortion without complication: Secondary | ICD-10-CM | POA: Diagnosis not present

## 2014-09-18 LAB — HCG, QUANTITATIVE, PREGNANCY: hCG, Beta Chain, Quant, S: 498 m[IU]/mL — ABNORMAL HIGH

## 2014-09-18 NOTE — MAU Provider Note (Signed)
  Subjective:  [name] is a 33 y.o. female [phone] at [redacted]w[redacted]d who presents to MAU for day #7 labs for methotrexate treatment for presumed ectopic pregnancy/ failed pregnancy. She was followed in MAU for greater than a week with inappropriate rise in beta hcg levels. Currently she is spotting very minimally, denies abdominal pain.   Objective:   GENERAL: Well-developed, well-nourished female in no acute distress.  HEENT: Normocephalic, atraumatic.   LUNGS: Effort normal SKIN: Warm, dry and without erythema PSYCH: Normal mood and affect  Filed Vitals:   09/18/14 0928  BP: 141/81  Pulse: 72  Temp: 98.4 F (36.9 C)  TempSrc: Oral  Resp: 16   Results for orders placed during the hospital encounter of 09/18/14 (from the past 48 hour(s))  HCG, QUANTITATIVE, PREGNANCY     Status: Abnormal   Collection Time    09/18/14  8:40 AM      Result Value Ref Range   hCG, Beta Chain, Quant, S 498 (*) <5 mIU/mL   Comment:              GEST. AGE      CONC.  (mIU/mL)       <=1 WEEK        5 - 50         2 WEEKS       50 - 500         3 WEEKS       100 - 10,000         4 WEEKS     1,000 - 30,000         5 WEEKS     3,500 - 115,000       6-8 WEEKS     12,000 - 270,000        12 WEEKS     15,000 - 220,000                FEMALE AND NON-PREGNANT FEMALE:         LESS THAN 5 mIU/mL     MDM:  Beta hcg day 1: 961 Beta hcg day 4: 715 Beta hcg day 7: 498 Discussed findings with Dr. Roselie Awkward. Ok for patient to follow up in the clinic in 1 week.   Assessment:  Presumed ectopic pregnancy Failed pregnancy Methotrexate treatment with appropriate decline in hcg levels    Plan:  Discharge home in stable condition Follow up in the clinic in 1 week for hcg level (message sent) Ectopic precautions Pelvic rest     Glen [place], NP 09/18/2014 9:32 AM

## 2014-09-18 NOTE — MAU Note (Signed)
Pt here for blood work.  Day 7 post MTX.  No complaints. Still having some spotting.  occassionally light cramping.

## 2014-09-23 ENCOUNTER — Encounter (HOSPITAL_COMMUNITY): Payer: Self-pay | Admitting: *Deleted

## 2014-09-25 ENCOUNTER — Other Ambulatory Visit: Payer: Self-pay

## 2014-09-25 DIAGNOSIS — O039 Complete or unspecified spontaneous abortion without complication: Secondary | ICD-10-CM

## 2014-09-26 LAB — HCG, QUANTITATIVE, PREGNANCY: hCG, Beta Chain, Quant, S: 136.7 m[IU]/mL

## 2014-09-30 ENCOUNTER — Telehealth: Payer: Self-pay | Admitting: General Practice

## 2014-09-30 NOTE — Telephone Encounter (Signed)
Patient called and left message stating she had a hcg done last week and would like the results. Per chart review with Dr Harolyn Rutherford, patient needs weekly quants until less than 5. Patient will need bhcg this week. Called patient, no answer- left message that we are trying to return your phone call, please call us back at the clinics

## 2014-10-01 ENCOUNTER — Telehealth: Payer: Self-pay | Admitting: Family Medicine

## 2014-10-01 NOTE — Telephone Encounter (Signed)
Patient called and left message stating she is returning our call. Called patient back and informed her of results and that we will need to continue to watch these levels until they are less than 5, which would mean she needs another lab draw this week. Patient verbalized understanding and states she can come tomorrow @ 10. Patient had no other questions

## 2014-10-02 ENCOUNTER — Other Ambulatory Visit: Payer: Self-pay

## 2014-10-02 NOTE — Telephone Encounter (Signed)
P.A. Approved til 10/02/15, pt informed, faxed pharmacy

## 2014-10-03 ENCOUNTER — Other Ambulatory Visit: Payer: Self-pay

## 2014-10-03 DIAGNOSIS — Z3200 Encounter for pregnancy test, result unknown: Secondary | ICD-10-CM

## 2014-10-03 LAB — HCG, QUANTITATIVE, PREGNANCY: hCG, Beta Chain, Quant, S: 25.3 m[IU]/mL

## 2014-10-03 NOTE — Progress Notes (Unsigned)
Spoke with patient prior to repeat lab draw. Patient states she is feeling fine, had a little spotting yesterday but it is gone now and reports mild occasional pain in RLQ. Patient is aware of lab draws until beta-hcg is less than 5

## 2014-10-04 ENCOUNTER — Telehealth: Payer: Self-pay | Admitting: *Deleted

## 2014-10-04 NOTE — Telephone Encounter (Signed)
Called patient and informed her of bhcg level and need for another lab draw. Patient verbalized understanding and states she can come next Wednesday 11/18 @ 8:15. Patient had no questions

## 2014-10-04 NOTE — Telephone Encounter (Signed)
Pt left message requesting results

## 2014-10-06 ENCOUNTER — Telehealth: Payer: Self-pay | Admitting: Family Medicine

## 2014-10-08 NOTE — Telephone Encounter (Signed)
P.A. Adderall approved til 10/08/15, left message for pt, faxed pharmacy

## 2014-10-09 ENCOUNTER — Other Ambulatory Visit: Payer: Self-pay

## 2014-10-09 DIAGNOSIS — O039 Complete or unspecified spontaneous abortion without complication: Secondary | ICD-10-CM

## 2014-10-09 LAB — HCG, QUANTITATIVE, PREGNANCY: hCG, Beta Chain, Quant, S: 10.7 m[IU]/mL

## 2014-10-10 ENCOUNTER — Telehealth: Payer: Self-pay | Admitting: *Deleted

## 2014-10-10 NOTE — Telephone Encounter (Addendum)
Pt left message requesting lab results from 11/18. I returned pt's call and left message on her personal voice mail.  I stated that her results have been reviewed by Dr. Nehemiah Settle and her hormone level is decreasing as expected. She only needs follow up appt if she has not had a period in 4-6 weeks.

## 2014-12-30 ENCOUNTER — Telehealth: Payer: Self-pay | Admitting: Internal Medicine

## 2014-12-30 MED ORDER — AMPHETAMINE-DEXTROAMPHETAMINE 10 MG PO TABS: 10.0000 mg | ORAL_TABLET | Freq: Every day | ORAL | Status: DC

## 2014-12-30 MED ORDER — LISDEXAMFETAMINE DIMESYLATE 70 MG PO CAPS: 70.0000 mg | ORAL_CAPSULE | Freq: Every day | ORAL | Status: DC

## 2014-12-30 NOTE — Telephone Encounter (Signed)
Pt needs a refill on her adderall 10mg  and vyvanse 70mg . Call when ready

## 2014-12-31 ENCOUNTER — Telehealth: Payer: Self-pay | Admitting: Family Medicine

## 2014-12-31 NOTE — Telephone Encounter (Signed)
Called pt and advised rx ready

## 2015-04-23 ENCOUNTER — Telehealth: Payer: Self-pay | Admitting: Family Medicine

## 2015-04-23 MED ORDER — LISDEXAMFETAMINE DIMESYLATE 70 MG PO CAPS: 70.0000 mg | ORAL_CAPSULE | Freq: Every day | ORAL | Status: DC

## 2015-04-23 MED ORDER — AMPHETAMINE-DEXTROAMPHETAMINE 10 MG PO TABS: 10.0000 mg | ORAL_TABLET | Freq: Every day | ORAL | Status: DC

## 2015-04-23 NOTE — Telephone Encounter (Signed)
Left message to inform pt rx is ready.

## 2015-04-23 NOTE — Telephone Encounter (Signed)
Pt needs Vyvanse & Adderall written

## 2015-07-01 ENCOUNTER — Encounter (HOSPITAL_COMMUNITY): Payer: Self-pay | Admitting: *Deleted

## 2015-07-30 ENCOUNTER — Telehealth: Payer: Self-pay | Admitting: Family Medicine

## 2015-07-30 MED ORDER — LISDEXAMFETAMINE DIMESYLATE 70 MG PO CAPS: 70.0000 mg | ORAL_CAPSULE | Freq: Every day | ORAL | Status: DC

## 2015-07-30 MED ORDER — AMPHETAMINE-DEXTROAMPHETAMINE 10 MG PO TABS: 10.0000 mg | ORAL_TABLET | Freq: Every day | ORAL | Status: DC

## 2015-07-30 NOTE — Telephone Encounter (Signed)
Pt called for refills of vyvance. Please call  [phone] when ready.

## 2015-10-10 LAB — OB RESULTS CONSOLE RPR: RPR: NONREACTIVE

## 2015-10-10 LAB — OB RESULTS CONSOLE GC/CHLAMYDIA
Chlamydia: NEGATIVE
Gonorrhea: NEGATIVE

## 2015-10-10 LAB — OB RESULTS CONSOLE ANTIBODY SCREEN: Antibody Screen: NEGATIVE

## 2015-10-10 LAB — OB RESULTS CONSOLE HEPATITIS B SURFACE ANTIGEN: Hepatitis B Surface Ag: NEGATIVE

## 2015-10-10 LAB — OB RESULTS CONSOLE HIV ANTIBODY (ROUTINE TESTING): HIV: NONREACTIVE

## 2015-10-10 LAB — OB RESULTS CONSOLE ABO/RH: RH Type: POSITIVE

## 2015-10-10 LAB — OB RESULTS CONSOLE RUBELLA ANTIBODY, IGM: Rubella: IMMUNE

## 2016-04-07 LAB — OB RESULTS CONSOLE GBS: GBS: NEGATIVE

## 2016-04-20 ENCOUNTER — Encounter (HOSPITAL_COMMUNITY): Payer: Self-pay

## 2016-05-04 ENCOUNTER — Encounter (HOSPITAL_COMMUNITY): Payer: Self-pay | Admitting: Obstetrics and Gynecology

## 2016-05-04 ENCOUNTER — Encounter (HOSPITAL_COMMUNITY)
Admission: RE | Admit: 2016-05-04 | Discharge: 2016-05-04 | Disposition: A | Discharge status: Home / Self Care | Payer: BLUE CROSS/BLUE SHIELD | Source: Ambulatory Visit | Attending: Obstetrics and Gynecology | Admitting: Obstetrics and Gynecology

## 2016-05-04 DIAGNOSIS — Z3490 Encounter for supervision of normal pregnancy, unspecified, unspecified trimester: Secondary | ICD-10-CM

## 2016-05-04 HISTORY — DX: Personal history of other infectious and parasitic diseases: Z86.19

## 2016-05-04 HISTORY — DX: Encounter for supervision of normal pregnancy, unspecified, unspecified trimester: Z34.90

## 2016-05-04 LAB — CBC
HCT: 36.7 % (ref 36.0–46.0)
Hemoglobin: 12.8 g/dL (ref 12.0–15.0)
MCH: 30.2 pg (ref 26.0–34.0)
MCHC: 34.9 g/dL (ref 30.0–36.0)
MCV: 86.6 fL (ref 78.0–100.0)
Platelets: 215 10*3/uL (ref 150–400)
RBC: 4.24 MIL/uL (ref 3.87–5.11)
RDW: 14.5 % (ref 11.5–15.5)
WBC: 11.2 10*3/uL — ABNORMAL HIGH (ref 4.0–10.5)

## 2016-05-04 LAB — TYPE AND SCREEN
ABO/RH(D): O POS
Antibody Screen: NEGATIVE

## 2016-05-04 NOTE — Patient Instructions (Signed)
[place]  05/04/2016   Your procedure is scheduled on:  05/05/2016  Enter through the Main Entrance of [facility] at [place] up the phone at the desk and dial [phone].   Call this number if you have problems the morning of surgery: [phone]   Remember:   Do not eat food:After Midnight.  Do not drink clear liquids: After Midnight.  Take these medicines the morning of surgery with A SIP OF WATER: none   Do not wear jewelry, make-up or nail polish.  Do not wear lotions, powders, or perfumes. You may wear deodorant.  Do not shave 48 hours prior to surgery.  Do not bring valuables to the hospital.  [facility] is not   responsible for any belongings or valuables brought to the hospital.  Contacts, dentures or bridgework may not be worn into surgery.  Leave suitcase in the car. After surgery it may be brought to your room.  For patients admitted to the hospital, checkout time is 11:00 AM the day of              discharge.   Patients discharged the day of surgery will not be allowed to drive             home.  Name and phone number of your driver: na   Special Instructions:   Shower using CHG 2 nights before surgery and the night before surgery.  If you shower the day of surgery use CHG.  Use special wash - you have one bottle of CHG for all showers.  You should use approximately 1/3 of the bottle for each shower.   Please read over the following fact sheets that you were given:   Surgical Site Infection Prevention

## 2016-05-04 NOTE — Anesthesia Preprocedure Evaluation (Addendum)
Anesthesia Evaluation  Patient identified by MRN, date of birth, ID band Patient awake    Reviewed: Allergy & Precautions, NPO status , Patient's Chart, lab work & pertinent test results  Airway Mallampati: II  TM Distance: >3 FB Neck ROM: Full    Dental  (+) Teeth Intact, Dental Advisory Given   Pulmonary asthma ,    Pulmonary exam normal breath sounds clear to auscultation       Cardiovascular negative cardio ROS Normal cardiovascular exam Rhythm:Regular Rate:Normal     Neuro/Psych PSYCHIATRIC DISORDERS Depression negative neurological ROS     GI/Hepatic negative GI ROS, Neg liver ROS,   Endo/Other  negative endocrine ROS  Renal/GU negative Renal ROS     Musculoskeletal negative musculoskeletal ROS (+) Scoliosis s/p repair   Abdominal   Peds  (+) ATTENTION DEFICIT DISORDER WITHOUT HYPERACTIVITY Hematology negative hematology ROS (+) Plt 215k   Anesthesia Other Findings Day of surgery medications reviewed with the patient.  Reproductive/Obstetrics (+) Pregnancy C-section x3                            Anesthesia Physical Anesthesia Plan  ASA: II  Anesthesia Plan: Combined Spinal and Epidural   Post-op Pain Management:    Induction:   Airway Management Planned: Nasal Cannula  Additional Equipment:   Intra-op Plan:   Post-operative Plan:   Informed Consent: I have reviewed the patients History and Physical, chart, labs and discussed the procedure including the risks, benefits and alternatives for the proposed anesthesia with the patient or authorized representative who has indicated his/her understanding and acceptance.   Dental advisory given  Plan Discussed with: CRNA, Anesthesiologist and Surgeon  Anesthesia Plan Comments: (Discussed risks and benefits of and differences between spinal and general. Discussed risks of spinal including headache, backache, failure, bleeding,  infection, and nerve damage. Patient consents to spinal. Questions answered. Coagulation studies and platelet count acceptable.)       Anesthesia Quick Evaluation

## 2016-05-04 NOTE — H&P (Signed)
[name] is a 35 y.o. female [phone] at 68 for rLTCS x 4. Also desires BTL by distal salpingectomy.   Pt with relatively uncomplicated Prenatal care   Elevated glucolaa with normal 3hr GTT.  GBBS neg  D/ pt r/b/a of rLTCS, especially of higher order.  Also r/b/a of tubal ligation.    Maternal Medical History:  Contractions: Frequency: irregular.    Fetal activity: Perceived fetal activity is normal.    Prenatal Complications - Diabetes: none.    OB History    Gravida Para Term Preterm AB TAB SAB Ectopic Multiple Living   5 3 3  1   1  3     LTCS x 3, ectopic x 1 g5 present, no abn pap - last 6/16; STD - remote Chl  Past Medical History  Diagnosis Date  . Kidney stones   . ADD (attention deficit disorder)   . Exercise-induced asthma   . Hx of varicella   . Term pregnancy, repeat 05/04/2016   Past Surgical History  Procedure Laterality Date  . Kidney stone surgery      R--surgery; L lithotripsy.  Dr. Gaynelle Arabian  . Cholecystectomy    . Cesarean section    . Hernia repair    . Hand surgery    . Back surgery      scoliosis age 70   Family History: family history includes Cancer in her maternal aunt, maternal grandmother, and mother. Social History:  reports that she has never smoked. She has never used smokeless tobacco. She reports that she does not drink alcohol or use illicit drugs. Meds PNV All NKDA  Prenatal Transfer Tool  Maternal Diabetes: No Genetic Screening: Normal Maternal Ultrasounds/Referrals: Normal Fetal Ultrasounds or other Referrals:  None Maternal Substance Abuse:  No Significant Maternal Medications:  None Significant Maternal Lab Results:  Lab values include: Group B Strep negative Other Comments:  None  Review of Systems  Constitutional: Negative.   HENT: Negative.   Eyes: Negative.   Respiratory: Negative.   Cardiovascular: Negative.  Negative for chest pain and palpitations.  Gastrointestinal: Positive for vomiting and diarrhea.   Genitourinary: Negative.   Musculoskeletal: Positive for back pain.  Skin: Negative.   Neurological: Negative.   Psychiatric/Behavioral: Negative.       Last menstrual period 08/06/2015, unknown if currently breastfeeding. Maternal Exam:  Uterine Assessment: Contraction strength is moderate.  Contraction frequency is irregular.   Abdomen: Patient reports no abdominal tenderness. Surgical scars: low transverse.   Fundal height is appropriate for gestation.   Estimated fetal weight is 7-8#.   Fetal presentation: vertex  Introitus: Normal vulva. Normal vagina.    Physical Exam  Constitutional: She is oriented to person, place, and time. She appears well-developed and well-nourished.  HENT:  Head: Normocephalic and atraumatic.  Cardiovascular: Normal rate and regular rhythm.   Respiratory: Effort normal and breath sounds normal. No respiratory distress. She has no wheezes.  GI: Soft. Bowel sounds are normal. She exhibits no distension. There is no tenderness.  Neurological: She is alert and oriented to person, place, and time.  Skin: Skin is warm and dry.  Psychiatric: She has a normal mood and affect. Her behavior is normal.    Prenatal labs: ABO, Rh: --/--/O POS (06/13 1150) Antibody: NEG (06/13 1150) Rubella: Immune (11/18 0000) RPR: Nonreactive (11/18 0000)  HBsAg: Negative (11/18 0000)  HIV: Non-reactive (11/18 0000)  GBS: Negative (05/17 0000)   Hgb 13.4/ Ur Cx + ecoli, neg TOC/GC neg/ Chl neg  glucola  142, nl 3hr GTT [place] nl anat, ant plac, female  Dated by LMP c/w first tri [place]  Assessment/Plan: 35yo [id number] at 39 wk for rLTCS and BTL D/w pt rba of rLTCS and tubal ligation Willl proceed - Ancef for prophylaxis Tdap PP  [name], [name] 05/04/2016, 10:01 PM

## 2016-05-05 ENCOUNTER — Inpatient Hospital Stay (HOSPITAL_COMMUNITY): Payer: BLUE CROSS/BLUE SHIELD | Admitting: Anesthesiology

## 2016-05-05 ENCOUNTER — Inpatient Hospital Stay (HOSPITAL_COMMUNITY)
Admission: RE | Admit: 2016-05-05 | Discharge: 2016-05-07 | DRG: 766 | Disposition: A | Discharge status: Home / Self Care | Payer: BLUE CROSS/BLUE SHIELD | Source: Ambulatory Visit | Attending: Obstetrics and Gynecology | Admitting: Obstetrics and Gynecology

## 2016-05-05 ENCOUNTER — Encounter (HOSPITAL_COMMUNITY): Payer: Self-pay

## 2016-05-05 ENCOUNTER — Encounter (HOSPITAL_COMMUNITY)
Admission: RE | Disposition: A | Discharge status: Home / Self Care | Payer: Self-pay | Source: Ambulatory Visit | Attending: Obstetrics and Gynecology

## 2016-05-05 DIAGNOSIS — Z3490 Encounter for supervision of normal pregnancy, unspecified, unspecified trimester: Secondary | ICD-10-CM

## 2016-05-05 DIAGNOSIS — Z98891 History of uterine scar from previous surgery: Secondary | ICD-10-CM

## 2016-05-05 DIAGNOSIS — O34211 Maternal care for low transverse scar from previous cesarean delivery: Secondary | ICD-10-CM | POA: Diagnosis present

## 2016-05-05 DIAGNOSIS — M419 Scoliosis, unspecified: Secondary | ICD-10-CM | POA: Diagnosis present

## 2016-05-05 DIAGNOSIS — Z87442 Personal history of urinary calculi: Secondary | ICD-10-CM | POA: Diagnosis not present

## 2016-05-05 DIAGNOSIS — O9952 Diseases of the respiratory system complicating childbirth: Secondary | ICD-10-CM | POA: Diagnosis present

## 2016-05-05 DIAGNOSIS — Z9851 Tubal ligation status: Secondary | ICD-10-CM

## 2016-05-05 DIAGNOSIS — Z3A39 39 weeks gestation of pregnancy: Secondary | ICD-10-CM

## 2016-05-05 DIAGNOSIS — Z302 Encounter for sterilization: Secondary | ICD-10-CM

## 2016-05-05 DIAGNOSIS — J45909 Unspecified asthma, uncomplicated: Secondary | ICD-10-CM | POA: Diagnosis present

## 2016-05-05 HISTORY — PX: BILATERAL SALPINGECTOMY: SHX5743

## 2016-05-05 HISTORY — DX: History of uterine scar from previous surgery: Z98.891

## 2016-05-05 HISTORY — DX: Encounter for supervision of normal pregnancy, unspecified, unspecified trimester: Z34.90

## 2016-05-05 HISTORY — DX: Tubal ligation status: Z98.51

## 2016-05-05 LAB — RPR: RPR Ser Ql: NONREACTIVE

## 2016-05-05 SURGERY — Surgical Case
Anesthesia: Spinal | Site: Abdomen

## 2016-05-05 MED ORDER — BUPIVACAINE IN DEXTROSE 0.75-8.25 % IT SOLN
INTRATHECAL | Status: DC | PRN
  Administered 2016-05-05: 1.8 mL via INTRATHECAL

## 2016-05-05 MED ORDER — MEPERIDINE HCL 25 MG/ML IJ SOLN
INTRAMUSCULAR | Status: DC | PRN
  Administered 2016-05-05 (×2): 12.5 mg via INTRAVENOUS

## 2016-05-05 MED ORDER — PHENYLEPHRINE 40 MCG/ML (10ML) SYRINGE FOR IV PUSH (FOR BLOOD PRESSURE SUPPORT)
PREFILLED_SYRINGE | INTRAVENOUS | Status: AC
  Filled 2016-05-05: qty 10

## 2016-05-05 MED ORDER — TETANUS-DIPHTH-ACELL PERTUSSIS 5-2.5-18.5 LF-MCG/0.5 IM SUSP: 0.5000 mL | Freq: Once | INTRAMUSCULAR | Status: DC

## 2016-05-05 MED ORDER — OXYTOCIN 40 UNITS IN LACTATED RINGERS INFUSION - SIMPLE MED
2.5000 [IU]/h | INTRAVENOUS | Status: AC
  Administered 2016-05-05: 2.5 [IU]/h via INTRAVENOUS
  Filled 2016-05-05: qty 1000

## 2016-05-05 MED ORDER — SENNOSIDES-DOCUSATE SODIUM 8.6-50 MG PO TABS
2.0000 | ORAL_TABLET | ORAL | Status: DC
  Administered 2016-05-06 (×2): 2 via ORAL
  Filled 2016-05-05 (×2): qty 2

## 2016-05-05 MED ORDER — SIMETHICONE 80 MG PO CHEW: 80.0000 mg | CHEWABLE_TABLET | ORAL | Status: DC | PRN

## 2016-05-05 MED ORDER — CHLOROPROCAINE HCL (PF) 3 % IJ SOLN
INTRAMUSCULAR | Status: AC
  Filled 2016-05-05: qty 20

## 2016-05-05 MED ORDER — FENTANYL CITRATE (PF) 100 MCG/2ML IJ SOLN
INTRAMUSCULAR | Status: AC
  Filled 2016-05-05: qty 2

## 2016-05-05 MED ORDER — DEXAMETHASONE SODIUM PHOSPHATE 4 MG/ML IJ SOLN
INTRAMUSCULAR | Status: DC | PRN
  Administered 2016-05-05: 4 mg via INTRAVENOUS

## 2016-05-05 MED ORDER — SODIUM CHLORIDE 0.9% FLUSH: 3.0000 mL | INTRAVENOUS | Status: DC | PRN

## 2016-05-05 MED ORDER — CHLOROPROCAINE HCL (PF) 3 % IJ SOLN
INTRAMUSCULAR | Status: DC | PRN
  Administered 2016-05-05: 20 mL

## 2016-05-05 MED ORDER — LACTATED RINGERS IV SOLN
INTRAVENOUS | Status: DC
  Administered 2016-05-05: 21:00:00 via INTRAVENOUS
  Administered 2016-05-05: 125 mL/h via INTRAVENOUS

## 2016-05-05 MED ORDER — MEPERIDINE HCL 25 MG/ML IJ SOLN: 6.2500 mg | INTRAMUSCULAR | Status: DC | PRN

## 2016-05-05 MED ORDER — KETOROLAC TROMETHAMINE 30 MG/ML IJ SOLN: 30.0000 mg | Freq: Four times a day (QID) | INTRAMUSCULAR | Status: AC | PRN

## 2016-05-05 MED ORDER — PHENYLEPHRINE 8 MG IN D5W 100 ML (0.08MG/ML) PREMIX OPTIME
INJECTION | INTRAVENOUS | Status: DC | PRN
  Administered 2016-05-05: 60 ug/min via INTRAVENOUS

## 2016-05-05 MED ORDER — NALBUPHINE HCL 10 MG/ML IJ SOLN: 5.0000 mg | Freq: Once | INTRAMUSCULAR | Status: DC | PRN

## 2016-05-05 MED ORDER — DIPHENHYDRAMINE HCL 25 MG PO CAPS: 25.0000 mg | ORAL_CAPSULE | Freq: Four times a day (QID) | ORAL | Status: DC | PRN

## 2016-05-05 MED ORDER — DIPHENHYDRAMINE HCL 50 MG/ML IJ SOLN: 12.5000 mg | INTRAMUSCULAR | Status: DC | PRN

## 2016-05-05 MED ORDER — SCOPOLAMINE 1 MG/3DAYS TD PT72
MEDICATED_PATCH | TRANSDERMAL | Status: AC
  Filled 2016-05-05: qty 1

## 2016-05-05 MED ORDER — ONDANSETRON HCL 4 MG/2ML IJ SOLN: 4.0000 mg | Freq: Three times a day (TID) | INTRAMUSCULAR | Status: DC | PRN

## 2016-05-05 MED ORDER — SIMETHICONE 80 MG PO CHEW
80.0000 mg | CHEWABLE_TABLET | ORAL | Status: DC
  Administered 2016-05-06 (×2): 80 mg via ORAL
  Filled 2016-05-05 (×2): qty 1

## 2016-05-05 MED ORDER — MEPERIDINE HCL 25 MG/ML IJ SOLN
INTRAMUSCULAR | Status: AC
  Filled 2016-05-05: qty 1

## 2016-05-05 MED ORDER — COCONUT OIL OIL
1.0000 "application " | TOPICAL_OIL | Status: DC | PRN
  Filled 2016-05-05: qty 120

## 2016-05-05 MED ORDER — MORPHINE SULFATE (PF) 0.5 MG/ML IJ SOLN
INTRAMUSCULAR | Status: DC | PRN
  Administered 2016-05-05: .2 mg via INTRATHECAL

## 2016-05-05 MED ORDER — NALBUPHINE HCL 10 MG/ML IJ SOLN
5.0000 mg | INTRAMUSCULAR | Status: DC | PRN
  Administered 2016-05-05: 5 mg via SUBCUTANEOUS

## 2016-05-05 MED ORDER — FENTANYL CITRATE (PF) 100 MCG/2ML IJ SOLN
INTRAMUSCULAR | Status: DC | PRN
  Administered 2016-05-05: 50 ug via INTRAVENOUS
  Administered 2016-05-05: 40 ug via INTRAVENOUS
  Administered 2016-05-05: 10 ug via EPIDURAL

## 2016-05-05 MED ORDER — KETOROLAC TROMETHAMINE 30 MG/ML IJ SOLN
30.0000 mg | Freq: Four times a day (QID) | INTRAMUSCULAR | Status: AC | PRN
  Administered 2016-05-05: 30 mg via INTRAMUSCULAR

## 2016-05-05 MED ORDER — WITCH HAZEL-GLYCERIN EX PADS: 1.0000 "application " | MEDICATED_PAD | CUTANEOUS | Status: DC | PRN

## 2016-05-05 MED ORDER — NALOXONE HCL 2 MG/2ML IJ SOSY
1.0000 ug/kg/h | PREFILLED_SYRINGE | INTRAVENOUS | Status: DC | PRN
  Filled 2016-05-05: qty 2

## 2016-05-05 MED ORDER — SIMETHICONE 80 MG PO CHEW
80.0000 mg | CHEWABLE_TABLET | Freq: Three times a day (TID) | ORAL | Status: DC
  Administered 2016-05-05 – 2016-05-07 (×3): 80 mg via ORAL
  Filled 2016-05-05 (×4): qty 1

## 2016-05-05 MED ORDER — ONDANSETRON HCL 4 MG/2ML IJ SOLN
INTRAMUSCULAR | Status: AC
  Filled 2016-05-05: qty 2

## 2016-05-05 MED ORDER — PHENYLEPHRINE 8 MG IN D5W 100 ML (0.08MG/ML) PREMIX OPTIME
INJECTION | INTRAVENOUS | Status: AC
  Filled 2016-05-05: qty 100

## 2016-05-05 MED ORDER — ZOLPIDEM TARTRATE 5 MG PO TABS: 5.0000 mg | ORAL_TABLET | Freq: Every evening | ORAL | Status: DC | PRN

## 2016-05-05 MED ORDER — ACETAMINOPHEN 500 MG PO TABS
1000.0000 mg | ORAL_TABLET | Freq: Four times a day (QID) | ORAL | Status: AC
  Administered 2016-05-05 – 2016-05-06 (×4): 1000 mg via ORAL
  Filled 2016-05-05 (×4): qty 2

## 2016-05-05 MED ORDER — LIDOCAINE-EPINEPHRINE (PF) 2 %-1:200000 IJ SOLN
INTRAMUSCULAR | Status: DC | PRN
  Administered 2016-05-05: 3 mL via EPIDURAL
  Administered 2016-05-05: 2 mL via EPIDURAL

## 2016-05-05 MED ORDER — METOCLOPRAMIDE HCL 5 MG/ML IJ SOLN
INTRAMUSCULAR | Status: AC
  Filled 2016-05-05: qty 2

## 2016-05-05 MED ORDER — OXYCODONE HCL 5 MG PO TABS
10.0000 mg | ORAL_TABLET | ORAL | Status: DC | PRN
  Filled 2016-05-05 (×2): qty 2

## 2016-05-05 MED ORDER — LACTATED RINGERS IV SOLN
INTRAVENOUS | Status: DC
  Administered 2016-05-05: 09:00:00 via INTRAVENOUS
  Administered 2016-05-05: 1000 mL/h via INTRAVENOUS

## 2016-05-05 MED ORDER — DEXAMETHASONE SODIUM PHOSPHATE 4 MG/ML IJ SOLN
INTRAMUSCULAR | Status: AC
  Filled 2016-05-05: qty 1

## 2016-05-05 MED ORDER — CEFAZOLIN SODIUM-DEXTROSE 2-4 GM/100ML-% IV SOLN
2.0000 g | INTRAVENOUS | Status: AC
  Administered 2016-05-05: 2 g via INTRAVENOUS

## 2016-05-05 MED ORDER — NALBUPHINE HCL 10 MG/ML IJ SOLN
5.0000 mg | INTRAMUSCULAR | Status: DC | PRN
  Filled 2016-05-05: qty 1

## 2016-05-05 MED ORDER — OXYTOCIN 40 UNITS IN LACTATED RINGERS INFUSION - SIMPLE MED
INTRAVENOUS | Status: DC | PRN
  Administered 2016-05-05: 40 [IU] via INTRAVENOUS

## 2016-05-05 MED ORDER — NALOXONE HCL 0.4 MG/ML IJ SOLN: 0.4000 mg | INTRAMUSCULAR | Status: DC | PRN

## 2016-05-05 MED ORDER — OXYTOCIN 10 UNIT/ML IJ SOLN
INTRAMUSCULAR | Status: AC
Start: 2016-05-05 — End: 2016-05-05
  Filled 2016-05-05: qty 4

## 2016-05-05 MED ORDER — OXYCODONE HCL 5 MG PO TABS
5.0000 mg | ORAL_TABLET | ORAL | Status: DC | PRN
  Administered 2016-05-05 – 2016-05-07 (×9): 5 mg via ORAL
  Filled 2016-05-05 (×7): qty 1

## 2016-05-05 MED ORDER — KETOROLAC TROMETHAMINE 30 MG/ML IJ SOLN
INTRAMUSCULAR | Status: AC
  Filled 2016-05-05: qty 1

## 2016-05-05 MED ORDER — FENTANYL CITRATE (PF) 100 MCG/2ML IJ SOLN: 25.0000 ug | INTRAMUSCULAR | Status: DC | PRN

## 2016-05-05 MED ORDER — DIPHENHYDRAMINE HCL 25 MG PO CAPS
25.0000 mg | ORAL_CAPSULE | ORAL | Status: DC | PRN
  Filled 2016-05-05: qty 1

## 2016-05-05 MED ORDER — ACETAMINOPHEN 325 MG PO TABS
650.0000 mg | ORAL_TABLET | ORAL | Status: DC | PRN
  Administered 2016-05-06 – 2016-05-07 (×5): 650 mg via ORAL
  Filled 2016-05-05 (×5): qty 2

## 2016-05-05 MED ORDER — MENTHOL 3 MG MT LOZG: 1.0000 | LOZENGE | OROMUCOSAL | Status: DC | PRN

## 2016-05-05 MED ORDER — ONDANSETRON HCL 4 MG/2ML IJ SOLN
INTRAMUSCULAR | Status: DC | PRN
  Administered 2016-05-05: 4 mg via INTRAVENOUS

## 2016-05-05 MED ORDER — SCOPOLAMINE 1 MG/3DAYS TD PT72
1.0000 | MEDICATED_PATCH | Freq: Once | TRANSDERMAL | Status: DC
  Administered 2016-05-05: 1.5 mg via TRANSDERMAL

## 2016-05-05 MED ORDER — METOCLOPRAMIDE HCL 5 MG/ML IJ SOLN
INTRAMUSCULAR | Status: DC | PRN
  Administered 2016-05-05: 10 mg via INTRAVENOUS

## 2016-05-05 MED ORDER — PRENATAL MULTIVITAMIN CH
1.0000 | ORAL_TABLET | Freq: Every day | ORAL | Status: DC
  Administered 2016-05-06: 1 via ORAL
  Filled 2016-05-05: qty 1

## 2016-05-05 MED ORDER — LACTATED RINGERS IV SOLN: INTRAVENOUS | Status: DC

## 2016-05-05 MED ORDER — MORPHINE SULFATE (PF) 0.5 MG/ML IJ SOLN
INTRAMUSCULAR | Status: AC
  Filled 2016-05-05: qty 10

## 2016-05-05 MED ORDER — IBUPROFEN 800 MG PO TABS
800.0000 mg | ORAL_TABLET | Freq: Three times a day (TID) | ORAL | Status: DC
  Administered 2016-05-05 – 2016-05-07 (×5): 800 mg via ORAL
  Filled 2016-05-05 (×6): qty 1

## 2016-05-05 MED ORDER — LACTATED RINGERS IV SOLN
INTRAVENOUS | Status: DC | PRN
  Administered 2016-05-05: 09:00:00 via INTRAVENOUS

## 2016-05-05 MED ORDER — DIBUCAINE 1 % RE OINT
1.0000 | TOPICAL_OINTMENT | RECTAL | Status: DC | PRN
Start: 2016-05-05 — End: 2016-05-07

## 2016-05-05 SURGICAL SUPPLY — 35 items
APL SKNCLS STERI-STRIP NONHPOA (GAUZE/BANDAGES/DRESSINGS) ×3
BENZOIN TINCTURE PRP APPL 2/3 (GAUZE/BANDAGES/DRESSINGS) ×5 IMPLANT
CHLORAPREP W/TINT 26ML (MISCELLANEOUS) ×5 IMPLANT
CLAMP CORD UMBIL (MISCELLANEOUS) ×3 IMPLANT
CLOSURE WOUND 1/2 X4 (GAUZE/BANDAGES/DRESSINGS) ×1
CLOTH BEACON ORANGE TIMEOUT ST (SAFETY) ×5 IMPLANT
CONTAINER PREFILL 10% NBF 15ML (MISCELLANEOUS) ×3 IMPLANT
DRSG OPSITE POSTOP 4X10 (GAUZE/BANDAGES/DRESSINGS) ×5 IMPLANT
ELECT REM PT RETURN 9FT ADLT (ELECTROSURGICAL) ×5
ELECTRODE REM PT RTRN 9FT ADLT (ELECTROSURGICAL) ×3 IMPLANT
GLOVE BIO SURGEON STRL SZ 6.5 (GLOVE) ×4 IMPLANT
GLOVE BIO SURGEONS STRL SZ 6.5 (GLOVE) ×1
GLOVE BIOGEL PI IND STRL 7.0 (GLOVE) ×3 IMPLANT
GLOVE BIOGEL PI INDICATOR 7.0 (GLOVE) ×2
GOWN STRL REUS W/TWL LRG LVL3 (GOWN DISPOSABLE) ×10 IMPLANT
NS IRRIG 1000ML POUR BTL (IV SOLUTION) ×5 IMPLANT
PACK C SECTION WH (CUSTOM PROCEDURE TRAY) ×5 IMPLANT
PAD OB MATERNITY 4.3X12.25 (PERSONAL CARE ITEMS) ×5 IMPLANT
PENCIL SMOKE EVAC W/HOLSTER (ELECTROSURGICAL) ×5 IMPLANT
RTRCTR C-SECT PINK 25CM LRG (MISCELLANEOUS) ×5 IMPLANT
STRIP CLOSURE SKIN 1/2X4 (GAUZE/BANDAGES/DRESSINGS) ×4 IMPLANT
SUT MNCRL 0 VIOLET CTX 36 (SUTURE) ×6 IMPLANT
SUT MONOCRYL 0 CTX 36 (SUTURE) ×4
SUT PLAIN 1 NONE 54 (SUTURE) ×3 IMPLANT
SUT PLAIN 2 0 XLH (SUTURE) ×5 IMPLANT
SUT VIC AB 0 CT1 27 (SUTURE) ×10
SUT VIC AB 0 CT1 27XBRD ANBCTR (SUTURE) ×6 IMPLANT
SUT VIC AB 2-0 CT1 27 (SUTURE) ×5
SUT VIC AB 2-0 CT1 TAPERPNT 27 (SUTURE) ×3 IMPLANT
SUT VIC AB 3-0 SH 27 (SUTURE) ×5
SUT VIC AB 3-0 SH 27X BRD (SUTURE) ×1 IMPLANT
SUT VIC AB 4-0 KS 27 (SUTURE) ×5 IMPLANT
SYR BULB IRRIGATION 50ML (SYRINGE) ×5 IMPLANT
TOWEL OR 17X24 6PK STRL BLUE (TOWEL DISPOSABLE) ×5 IMPLANT
TRAY FOLEY CATH SILVER 14FR (SET/KITS/TRAYS/PACK) ×3 IMPLANT

## 2016-05-05 NOTE — Addendum Note (Signed)
Addendum  created 05/05/16 1540 by Hewitt Blade, CRNA   Modules edited: Clinical Notes   Clinical Notes:  File: [id number]

## 2016-05-05 NOTE — Op Note (Signed)
NAME:  [name], [name] NO.:  [id number]  MEDICAL RECORD NO.:  [id number]  LOCATION:  [facility]                          FACILITY:  [facility]  PHYSICIAN:  Thornell Sartorius, MD        DATE OF BIRTH:  [birth year]  DATE OF PROCEDURE:  05/05/2016 DATE OF DISCHARGE:                              OPERATIVE REPORT   PREOPERATIVE DIAGNOSIS:  History of low-transverse cesarean section x3, desires repeat, intrauterine pregnancy at term, and undesired fertility.  POSTOPERATIVE DIAGNOSIS:  History of low-transverse cesarean section x3, desires repeat, intrauterine pregnancy at term, and undesired fertility, delivered.  PROCEDURE:  Low-transverse cesarean section and bilateral distal salpingectomy.  SURGEON:  Thornell Sartorius, MD  ASSISTANT:  Vaughan Browner, RNFA  ANESTHESIA:  Combined spinal and epidural.  EBL:  Approximately 700 mL.  URINE OUTPUT:  500 mL clear urine at the end of the procedure.  IV FLUIDS:  3500 mL.  FINDINGS:  Viable female infant at 8:55 a.m. with Apgars of 9 at one minute and 9 at five minutes.  Weight pending at the time of dictation. Normal postpartum uterus, tubes, and ovaries are noted.  PATHOLOGY:  Placenta to L and D, bilateral tubal segments to Pathology.  COMPLICATIONS:  None.  PROCEDURE:  After informed consent was reviewed with the patient including risks, benefits, and alternatives of the surgical procedure, she was transported to the OR, where combined spinal and epidural anesthesia were placed and found to be adequate.  She was then returned to the supine position with a leftward tilt, prepped and draped in the normal sterile fashion.  A Foley catheter was sterilely placed.  A Pfannenstiel skin incision was made at the level of approximately 2 fingerbreadths above her pubic symphysis, carried through the underlying layer of fascia sharply.  The fascia was incised in the midline.  The incision was extended laterally with Mayo scissors.  Superior  aspect of fascial incision was grasped with Kocher clamps, elevated, and the rectus muscles were dissected off both, bluntly and sharply.  Peritoneum was entered with the aid of hemostats.  The peritoneal incision was extended superiorly and inferiorly with good visualization of the bladder.  An Alexis skin retractor was placed carefully making sure that no bowel was entrapped.  Bladder flap was created sharply.  After placement of the Alexis skin retractor and carefully making sure no bowel was entrapped, the uterus was incised in transverse fashion. Infant was delivered from a vertex presentation.  Nose and mouth were suctioned on the field.  Cord was clamped and cut.  Infant was handed off to the awaiting pediatric staff a minute prior to cord clamp.  The placenta was expressed from the uterus.  The uterus was cleared of all clot and debris.  Uterine incision was closed with 2 layers of 0 Monocryl, first of which was running locked and the second as an imbricating layer.  The incision made hemostatic with Bovie cautery. Attention was turned to performing a salpingectomy on the left side and identifying her tube.  A hole was made in the mesosalpinx and this was prepared with 3-0 Vicryl.  The salpingectomy was performed and found to be hemostatic.  The tube was identified and followed out to the fimbriated end and isolated with a Claiborne Billings and doubly ligated with plain gut.  On the right, similar procedure, the tube was easily identified, followed out to the fimbriated end, and isolated with a Claiborne Billings and doubly ligated with a plain gut.  These were both noted to be hemostatic as was the incision.  The Alexis skin retractor was removed.  The peritoneum was reapproximated using 2-0 Vicryl.  The subfascial planes were inspected, found hemostatic.  The fascia was closed in a single suture of 0 Vicryl.  The subcuticular adipose layer was made hemostatic with Bovie cautery.  The dead space was  closed using plain gut.  The skin was closed with 4-0 Vicryl on a Keith needle.  Benzoin and Steri-Strips were applied.  The patient tolerated the procedure well.  Sponge, lap, and needle counts were correct x2 per the operating staff.     Thornell Sartorius, MD     JB/MEDQ  D:  05/05/2016  T:  05/05/2016  Job:  [id number]

## 2016-05-05 NOTE — Anesthesia Postprocedure Evaluation (Signed)
Anesthesia Post Note  Patient: [name]  Procedure(s) Performed: Procedure(s) (LRB): CESAREAN SECTION (N/A) BILATERAL SALPINGECTOMY (Bilateral)  Patient location during evaluation: PACU Anesthesia Type: Spinal Level of consciousness: oriented and awake and alert Pain management: pain level controlled Vital Signs Assessment: post-procedure vital signs reviewed and stable Respiratory status: spontaneous breathing, respiratory function stable and patient connected to nasal cannula oxygen Cardiovascular status: blood pressure returned to baseline and stable Postop Assessment: no headache, no backache, spinal receding, patient able to bend at knees, no signs of nausea or vomiting and adequate PO intake Anesthetic complications: no     Last Vitals:  Filed Vitals:   05/05/16 1115 05/05/16 1130  BP: 136/93 129/78  Pulse: 56 48  Temp:    Resp: 11 16    Last Pain: There were no vitals filed for this visit. Pain Goal: Patients Stated Pain Goal: 0 (05/05/16 1004)               Catalina Gravel

## 2016-05-05 NOTE — Anesthesia Procedure Notes (Signed)
Spinal Patient location during procedure: OR Staffing Anesthesiologist: Catalina Gravel Performed by: anesthesiologist  Preanesthetic Checklist Completed: patient identified, surgical consent, pre-op evaluation, timeout performed, IV checked, risks and benefits discussed and monitors and equipment checked Spinal Block Patient position: sitting Prep: site prepped and draped and DuraPrep Patient monitoring: continuous pulse ox and blood pressure Approach: midline Location: L3-4 Needle Needle type: Pencan and Tuohy  Needle gauge: 24 G Needle length: 10 cm Needle insertion depth: 6 cm Catheter type: closed end flexible Catheter at skin depth: 10 cm Additional Notes Combined spinal-epidural. LOR with touhy needle with air at 6cm.  Spinal needle advanced through touhy with CSF return. No paresthesias, no heme.  Functioning IV was confirmed and monitors were applied. Sterile prep and drape, including hand hygiene, mask and sterile gloves were used. The patient was positioned and the spine was prepped. The skin was anesthetized with lidocaine.  Free flow of clear CSF was obtained prior to injecting local anesthetic into the CSF.  The spinal needle aspirated freely following injection.  The needle was carefully withdrawn.  The patient tolerated the procedure well. Consent was obtained prior to procedure with all questions answered and concerns addressed. Risks including but not limited to bleeding, infection, nerve damage, paralysis, failed block, inadequate analgesia, allergic reaction, high spinal, itching and headache were discussed and the patient wished to proceed.   Hoy Morn, MD

## 2016-05-05 NOTE — Lactation Note (Signed)
This note was copied from a baby's chart. Lactation Consultation Note  Patient Name: [name] [id number] Date: 05/05/2016 Reason for consult: Initial assessment Baby at 12 hr of life. Mom reports bf is going well. She denies breast or nipple pain. She has a Hx of recurring thrush while bf all of her children. It was so bad with her youngest that she stopped bf. IF she were to Dx with it again by her MD. OTC treatments include gentian violet 0.5%-1.0% solution swabbed on the nipples and the in the baby's mouth 1-2 times a day for 3-7 days. If the symptoms go away after 4 days, stop the treatment. Miconazle or ketoconazole can be used 2-4 times daily for 7 days or until the symptoms go away. Everything that touched milk, breast, or the baby's mouth must be sanitized daily to prevent repeat exposures. That includes bottles, pacifiers, pump parts, bras, bra pads, and toys. Boil everything that can be boiled for 20 minutes. Wash clothing in hot water or on the sanitize cycle. Items that cannot be cleaned should be thrown away within a week. Everyone in the family should be washing their hands often, this includes baby hand washing. Freezing does not kill yeast. Any milk that is pumped during the infection period should be fed to baby during the treatment time frame. If this is not possible the milk can be boiled then cooled before feeding to baby.  Talk to your PCP about Rx treatments.  Discussed baby behavior, feeding frequency, baby belly size, voids, wt loss, breast changes, and nipple care. Mom stated she can manually express and has spoon in the room. Given lactation handouts. Aware of OP services and support group.     Maternal Data Has patient been taught Hand Expression?: Yes Does the patient have breastfeeding experience prior to this delivery?: Yes  Feeding Feeding Type: Breast Fed Length of feed: 15 min  LATCH Score/Interventions Latch: Repeated attempts needed to sustain latch, nipple  held in mouth throughout feeding, stimulation needed to elicit sucking reflex. Intervention(s): Adjust position;Assist with latch  Audible Swallowing: None  Type of Nipple: Everted at rest and after stimulation  Comfort (Breast/Nipple): Soft / non-tender     Hold (Positioning): Assistance needed to correctly position infant at breast and maintain latch. Intervention(s): Breastfeeding basics reviewed;Support Pillows;Position options  LATCH Score: 6  Lactation Tools Discussed/Used WIC Program: No   Consult Status Consult Status: Follow-up Date: 05/06/16 Follow-up type: In-patient    Denzil Hughes 05/05/2016, 9:11 PM

## 2016-05-05 NOTE — Anesthesia Postprocedure Evaluation (Signed)
Anesthesia Post Note  Patient: [name]  Procedure(s) Performed: Procedure(s) (LRB): CESAREAN SECTION (N/A) BILATERAL SALPINGECTOMY (Bilateral)  Patient location during evaluation: Mother Baby Anesthesia Type: Combined Spinal/Epidural Level of consciousness: oriented and awake and alert Pain management: pain level controlled Vital Signs Assessment: post-procedure vital signs reviewed and stable Respiratory status: spontaneous breathing and nonlabored ventilation Cardiovascular status: stable Postop Assessment: patient able to bend at knees, no signs of nausea or vomiting and adequate PO intake Anesthetic complications: no     Last Vitals:  Filed Vitals:   05/05/16 1232 05/05/16 1348  BP: 117/68 118/61  Pulse: 44 50  Temp: 36.5 C 36.6 C  Resp: 20 20    Last Pain:  Filed Vitals:   05/05/16 1349  PainSc: 3    Pain Goal: Patients Stated Pain Goal: 0 (05/05/16 1004)               Naszir Cott Hristova

## 2016-05-05 NOTE — Brief Op Note (Signed)
05/05/2016  9:58 AM  PATIENT:  [name]  35 y.o. female  PRE-OPERATIVE DIAGNOSIS:  Repeat C-Section, Sterilization  POST-OPERATIVE DIAGNOSIS:  Repeat C-Section, Sterilization, delivered   PROCEDURE:  Procedure(s): CESAREAN SECTION (N/A) BILATERAL SALPINGECTOMY (Bilateral)  SURGEON:  Surgeon(s) and Role:    * Janyth Contes, MD - Primary  ASSISTANTS: [name] RNFA   ANESTHESIA:   epidural and spinal  EBL:  Total I/O In: 3500 [I.V.:3500] Out: 1200 [Urine:500; Blood:700]   FINDINGS: viable female infant at 8:55, apgars 9/9, wt P; nl uterus, tubes and ovaries.    BLOOD ADMINISTERED:none  DRAINS: Urinary Catheter (Foley)   LOCAL MEDICATIONS USED:  NONE  SPECIMEN:  Source of Specimen:  Placenta, B tubes  DISPOSITION OF SPECIMEN:  L&D, Pathology  COUNTS:  YES  TOURNIQUET:  * No tourniquets in log *  DICTATION: .Other Dictation: Dictation Number [id number]  PLAN OF CARE: Admit to inpatient   PATIENT DISPOSITION:  PACU - hemodynamically stable.   Delay start of Pharmacological VTE agent (>24hrs) due to surgical blood loss or risk of bleeding: not applicable

## 2016-05-05 NOTE — Transfer of Care (Signed)
Immediate Anesthesia Transfer of Care Note  Patient: [name]  Procedure(s) Performed: Procedure(s): CESAREAN SECTION (N/A) BILATERAL SALPINGECTOMY (Bilateral)  Patient Location: PACU  Anesthesia Type:Spinal and Epidural  Level of Consciousness: awake, alert  and oriented  Airway & Oxygen Therapy: Patient Spontanous Breathing  Post-op Assessment: Report given to RN  Post vital signs: Reviewed and stable  Last Vitals:  Filed Vitals:   05/05/16 0718  BP: 143/93  Pulse: 74  Temp: 36.3 C  Resp: 16    Last Pain: Patients Stated Pain Goal: 3 ([id number])  Complications: No apparent anesthesia complications

## 2016-05-05 NOTE — Interval H&P Note (Signed)
History and Physical Interval Note:  05/05/2016 8:00 AM  [name]  has presented today for surgery, with the diagnosis of Repeat C-Section, Sterilization  The various methods of treatment have been discussed with the patient and family. After consideration of risks, benefits and other options for treatment, the patient has consented to  Procedure(s) with comments: [place] (Bilateral) - [name] or [name] to RNFA as a surgical intervention .  The patient's history has been reviewed, patient examined, no change in status, stable for surgery.  I have reviewed the patient's chart and labs.  Questions were answered to the patient's satisfaction.     Bovard-Stuckert, [name]

## 2016-05-06 LAB — CBC
HCT: 28.8 % — ABNORMAL LOW (ref 36.0–46.0)
Hemoglobin: 9.8 g/dL — ABNORMAL LOW (ref 12.0–15.0)
MCH: 29.3 pg (ref 26.0–34.0)
MCHC: 34 g/dL (ref 30.0–36.0)
MCV: 86.2 fL (ref 78.0–100.0)
Platelets: 163 10*3/uL (ref 150–400)
RBC: 3.34 MIL/uL — ABNORMAL LOW (ref 3.87–5.11)
RDW: 14.5 % (ref 11.5–15.5)
WBC: 15.7 10*3/uL — ABNORMAL HIGH (ref 4.0–10.5)

## 2016-05-06 LAB — BIRTH TISSUE RECOVERY COLLECTION (PLACENTA DONATION)

## 2016-05-06 NOTE — Progress Notes (Signed)
Subjective: Postpartum Day 1: Cesarean Delivery Patient reports incisional pain and no problems voiding.    Objective: Vital signs in last 24 hours: Temp:  [97.7 F (36.5 C)-98.5 F (36.9 C)] 98.5 F (36.9 C) (06/15 0434) Pulse Rate:  [44-66] 49 (06/15 0434) Resp:  [11-20] 18 (06/15 0434) BP: (110-145)/(61-93) 110/72 mmHg (06/15 0434) SpO2:  [93 %-98 %] 98 % (06/14 1722)  Physical Exam:  General: alert and cooperative Lochia: appropriate Uterine Fundus: firm Incision:C/D/I    Recent Labs  05/04/16 1150 05/06/16 0526  HGB 12.8 9.8*  HCT 36.7 28.8*    Assessment/Plan: Status post Cesarean section. Doing well postoperatively.  Continue current care. D/w pt circumcision and the parents desire to proceed.  Will do today or tomorrow AM.  Logan Bores 05/06/2016, 8:05 AM

## 2016-05-06 NOTE — Progress Notes (Signed)
Foley catheter was not draining,  No visible urine in tubing.  Placement check and repositioned.  Catheter began to  Drain urine.  Will monitor again prior to next check

## 2016-05-06 NOTE — Lactation Note (Signed)
This note was copied from a baby's chart. Lactation Consultation Note  Patient Name: [name] [id number] Date: 05/06/2016 Reason for consult: Follow-up assessment Baby at 35 hr of life and mom is reporting sore nipples. No skin break down noted at this time. Assisted mom in a comfortable latch using the cross cradle position. Reviewed nipple care. Mom is aware of lactation services and support group.   Maternal Data    Feeding Feeding Type: Breast Fed Length of feed: 10 min  LATCH Score/Interventions Latch: Grasps breast easily, tongue down, lips flanged, rhythmical sucking. Intervention(s): Adjust position;Assist with latch;Breast compression  Audible Swallowing: Spontaneous and intermittent Intervention(s): Hand expression  Type of Nipple: Everted at rest and after stimulation  Comfort (Breast/Nipple): Filling, red/small blisters or bruises, mild/mod discomfort  Problem noted: Mild/Moderate discomfort Interventions (Mild/moderate discomfort): Hand expression  Hold (Positioning): Assistance needed to correctly position infant at breast and maintain latch. Intervention(s): Support Pillows;Position options  LATCH Score: 8  Lactation Tools Discussed/Used     Consult Status Consult Status: Follow-up Date: 05/07/16 Follow-up type: In-patient    Denzil Hughes 05/06/2016, 8:49 PM

## 2016-05-07 MED ORDER — PRENATAL MULTIVITAMIN CH: 1.0000 | ORAL_TABLET | Freq: Every day | ORAL | Status: DC

## 2016-05-07 MED ORDER — OXYCODONE HCL 5 MG PO TABS: 5.0000 mg | ORAL_TABLET | Freq: Four times a day (QID) | ORAL | Status: DC | PRN

## 2016-05-07 MED ORDER — IBUPROFEN 800 MG PO TABS: 800.0000 mg | ORAL_TABLET | Freq: Three times a day (TID) | ORAL | Status: DC

## 2016-05-07 NOTE — Progress Notes (Signed)
Subjective: Postpartum Day 2: Cesarean Delivery Patient reports incisional pain, tolerating PO and no problems voiding.    Objective: Vital signs in last 24 hours: Temp:  [97.9 F (36.6 C)-98 F (36.7 C)] 98 F (36.7 C) (06/16 0600) Pulse Rate:  [63-69] 63 (06/16 0600) Resp:  [18] 18 (06/16 0600) BP: (130-135)/(83-87) 130/83 mmHg (06/16 0600)  Physical Exam:  General: alert and no distress Lochia: appropriate Uterine Fundus: firm Incision: healing well DVT Evaluation: No evidence of DVT seen on physical exam.   Recent Labs  05/04/16 1150 05/06/16 0526  HGB 12.8 9.8*  HCT 36.7 28.8*    Assessment/Plan: Status post Cesarean section. Doing well postoperatively.  Discharge home with standard precautions and return to clinic in 2 weeks. D/C with percocet, motrin and PNV  Bovard-Stuckert, Janiyha Montufar 05/07/2016, 10:03 AM

## 2016-05-07 NOTE — Lactation Note (Signed)
This note was copied from a baby's chart. Lactation Consultation Note  Patient Name: [name] [id number] Date: 05/07/2016 Reason for consult: Follow-up assessment   with this mom and baby, now 30 hours old. The baby was latched deeply with strong suckles and swallows, and great breast movement. Mom did not have the baby's nose touching her breast, so i advised her to keep baby close. Basic breast care/engorgement reviewed. Mom knows to call for questions/concerns.    Maternal Data    Feeding Feeding Type: Breast Fed Length of feed: 15 min  LATCH Score/Interventions Latch: Grasps breast easily, tongue down, lips flanged, rhythmical sucking.  Audible Swallowing: Spontaneous and intermittent  Type of Nipple: Everted at rest and after stimulation  Comfort (Breast/Nipple): Soft / non-tender     Hold (Positioning): Assistance needed to correctly position infant at breast and maintain latch. Intervention(s): Breastfeeding basics reviewed;Support Pillows;Position options;Skin to skin  LATCH Score: 9  Lactation Tools Discussed/Used     Consult Status Consult Status: Complete Follow-up type: Call as needed    Tonna Corner 05/07/2016, 10:50 AM

## 2016-05-07 NOTE — Discharge Summary (Signed)
OB Discharge Summary     Patient Name: [name] DOB: 1981/10/16 MRN: [id number]  Date of admission: 05/05/2016 Delivering MD: Janyth Contes   Date of discharge: 05/07/2016  Admitting diagnosis: Repeat C-Section, Sterilization Intrauterine pregnancy: [redacted]w[redacted]d     Secondary diagnosis:  Principal Problem:   S/P cesarean section Active Problems:   Term pregnancy, repeat   S/P tubal ligation  Additional problems: N/A     Discharge diagnosis: Term Pregnancy Delivered                                                                                                Post partum procedures:N/A  Augmentation: N/A  Complications: None  Hospital course:  Sceduled C/S   35 y.o. yo [name] at [redacted]w[redacted]d was admitted to the hospital 05/05/2016 for scheduled cesarean section with the following indication:Elective Repeat.  Membrane Rupture Time/Date: 8:55 AM ,05/05/2016   Patient delivered a Viable infant.05/05/2016  Details of operation can be found in separate operative note.  Pateint had an uncomplicated postpartum course.  She is ambulating, tolerating a regular diet, passing flatus, and urinating well. Patient is discharged home in stable condition on  05/07/2016          Physical exam  Filed Vitals:   05/06/16 0030 05/06/16 0434 05/06/16 1810 05/07/16 0600  BP: 123/72 110/72 135/87 130/83  Pulse: 45 49 69 63  Temp: 98.2 F (36.8 C) 98.5 F (36.9 C) 97.9 F (36.6 C) 98 F (36.7 C)  TempSrc:   Oral Oral  Resp: 18 18 18 18   Weight:      SpO2:       General: alert and no distress Lochia: appropriate Uterine Fundus: firm Incision: Healing well with no significant drainage DVT Evaluation: No evidence of DVT seen on physical exam. Labs: Lab Results  Component Value Date   WBC 15.7* 05/06/2016   HGB 9.8* 05/06/2016   HCT 28.8* 05/06/2016   MCV 86.2 05/06/2016   PLT 163 05/06/2016   CMP Latest Ref Rng 09/12/2014  Glucose 70 - 99 mg/dL -  BUN 6 - 23 mg/dL 16   Creatinine 0.50 - 1.10 mg/dL 0.70  Sodium 137 - 147 mEq/L -  Potassium 3.7 - 5.3 mEq/L -  Chloride 96 - 112 mEq/L -  CO2 19 - 32 mEq/L -  Calcium 8.4 - 10.5 mg/dL -  Total Protein 6.0 - 8.3 g/dL -  Total Bilirubin 0.3 - 1.2 mg/dL -  Alkaline Phos 39 - 117 U/L -  AST 0 - 37 U/L 20  ALT 0 - 35 U/L -    Discharge instruction: per After Visit Summary and "Baby and Me Booklet".  After visit meds:    Medication List    TAKE these medications        acetaminophen 500 MG tablet  Commonly known as:  TYLENOL  Take 500 mg by mouth every 6 (six) hours as needed for moderate pain or headache.     ibuprofen 800 MG tablet  Commonly known as:  ADVIL,MOTRIN  Take 1 tablet (800 mg total) by mouth every 8 (eight) hours.  oxyCODONE 5 MG immediate release tablet  Commonly known as:  Oxy IR/ROXICODONE  Take 1 tablet (5 mg total) by mouth every 6 (six) hours as needed for severe pain.     prenatal multivitamin Tabs tablet  Take 1 tablet by mouth daily at 12 noon.        Diet: routine diet  Activity: Advance as tolerated. Pelvic rest for 6 weeks.   Outpatient follow up:2 and 6 weeks Follow up Appt:No future appointments. Follow up Visit:No Follow-up on file.  Postpartum contraception: Tubal Ligation  Newborn Data: Live born female  Birth Weight: 8 lb 7.3 oz (3835 g) APGAR: 9, 9  Baby Feeding: Breast Disposition:home with mother   05/07/2016 Janyth Contes, MD

## 2016-05-07 NOTE — Progress Notes (Signed)
Patient discharged to home. Discharge instructions and paperwork reviewed with patient and family. Patient has no questions at this time.

## 2016-11-01 ENCOUNTER — Encounter: Payer: Medicaid Other | Admitting: Family Medicine

## 2016-11-04 ENCOUNTER — Encounter: Payer: Medicaid Other | Admitting: Family Medicine

## 2016-11-10 ENCOUNTER — Ambulatory Visit (INDEPENDENT_AMBULATORY_CARE_PROVIDER_SITE_OTHER): Payer: BLUE CROSS/BLUE SHIELD | Admitting: Family Medicine

## 2016-11-10 ENCOUNTER — Other Ambulatory Visit: Payer: Self-pay

## 2016-11-10 ENCOUNTER — Encounter: Payer: Self-pay | Admitting: Family Medicine

## 2016-11-10 VITALS — BP 130/90 | HR 85 | Ht 64.0 in | Wt 155.0 lb

## 2016-11-10 DIAGNOSIS — F902 Attention-deficit hyperactivity disorder, combined type: Secondary | ICD-10-CM | POA: Diagnosis not present

## 2016-11-10 DIAGNOSIS — F410 Panic disorder [episodic paroxysmal anxiety] without agoraphobia: Secondary | ICD-10-CM

## 2016-11-10 MED ORDER — ALPRAZOLAM 0.25 MG PO TABS: 0.2500 mg | ORAL_TABLET | Freq: Two times a day (BID) | ORAL | 0 refills | Status: DC | PRN

## 2016-11-10 NOTE — Progress Notes (Signed)
   Subjective:    Patient ID: [name], female    DOB: 01/26/1981, 35 y.o.   MRN: [id number]  HPI She is here for a medication recheck. She now has a 44-month-old child at home which is her fourth. She has had a tubal ligation. She was breast-feeding and doing well with that. She did have one panic attack postpartum. She stopped breast-feeding approximately 3 weeks ago and has had 4 panic attacks since then. While breast-feeding she did have one menstrual cycle. She thinks she might be having another menstrual cycle within the next week or so. She does have concerns over the panic attacks. While she was pregnant she did not take any of her ADD medications. Since delivery she has done fairly well without being on any meds.   Review of Systems     Objective:   Physical Exam Alert and in no distress with appropriate affect       Assessment & Plan:  Panic attacks - Plan: ALPRAZolam (XANAX) 0.25 MG tablet  Attention deficit hyperactivity disorder (ADHD), combined type I discussed the panic attacks with her. She does realize that she is not in any danger. We discussed relaxation techniques and the fact that these attacks could easily be related to her hormone fluctuations. She is to use relaxation techniques when she has an attack and uses Xanax on an as-needed basis. Also discussed positive reinforcement of thought processes and getting rid of negative ones. We will monitor this closely. Discussed starting ADD medication but will hold off on that. She does realize that she can do fairly well without taken the ADD meds.

## 2016-11-10 NOTE — Telephone Encounter (Signed)
Called in xanax per JCL 

## 2017-06-01 ENCOUNTER — Institutional Professional Consult (permissible substitution): Payer: BLUE CROSS/BLUE SHIELD | Admitting: Family Medicine

## 2017-06-06 ENCOUNTER — Ambulatory Visit (INDEPENDENT_AMBULATORY_CARE_PROVIDER_SITE_OTHER): Payer: 59 | Admitting: Family Medicine

## 2017-06-06 ENCOUNTER — Encounter: Payer: Self-pay | Admitting: Family Medicine

## 2017-06-06 VITALS — BP 114/70 | HR 80 | Ht 64.0 in | Wt 156.0 lb

## 2017-06-06 DIAGNOSIS — F53 Postpartum depression: Secondary | ICD-10-CM

## 2017-06-06 DIAGNOSIS — F902 Attention-deficit hyperactivity disorder, combined type: Secondary | ICD-10-CM | POA: Diagnosis not present

## 2017-06-06 DIAGNOSIS — O99345 Other mental disorders complicating the puerperium: Secondary | ICD-10-CM

## 2017-06-06 MED ORDER — LISDEXAMFETAMINE DIMESYLATE 60 MG PO CAPS: 60.0000 mg | ORAL_CAPSULE | ORAL | 0 refills | Status: DC

## 2017-06-06 MED ORDER — LISDEXAMFETAMINE DIMESYLATE 50 MG PO CAPS: 50.0000 mg | ORAL_CAPSULE | Freq: Every day | ORAL | 0 refills | Status: DC

## 2017-06-06 NOTE — Patient Instructions (Signed)
Let me know if it works, how long it works and if you have any trouble with

## 2017-06-06 NOTE — Addendum Note (Signed)
Addended by: Denita Lung on: 06/06/2017 02:25 PM   Modules accepted: Orders

## 2017-06-06 NOTE — Progress Notes (Addendum)
   Subjective:    Patient ID: [name], female    DOB: 04/30/1981, 36 y.o.   MRN: [id number]  HPI She is here for consult concerning starting back on Vyvanse. She stopped taking the medication when she found out that she was pregnant. Her son is now almost a year of age. She would like to start back on medication. Postpartum she did have difficulty with panic attacks and was placed on Zoloft by her gynecologist. She states that her symptoms are now back to normal and she is quite happy with this regimen. She is not sure whether she needs same dose of the Vyvanse is in the past.   Review of Systems     Objective:   Physical Exam Alert and in no distress with appropriate affect       Assessment & Plan:  Attention deficit hyperactivity disorder (ADHD), combined type - Plan: lisdexamfetamine (VYVANSE) 50 MG capsule  Postpartum depression I will start her out on 60 mg of Vyvanse. She is to call me and let me know if it works, how long and if she has any side effects. She will continue on her Zoloft.

## 2017-06-07 ENCOUNTER — Telehealth: Payer: Self-pay

## 2017-06-07 NOTE — Telephone Encounter (Signed)
NO PA required on this drug for medicaid. Pt has picked up medication per pharmacy. Victorino December

## 2017-06-07 NOTE — Telephone Encounter (Signed)
PA approved through [employer] for $65.00/month, submitted PA through Medicaid as well. Victorino December

## 2017-06-27 ENCOUNTER — Telehealth: Payer: Self-pay | Admitting: Family Medicine

## 2017-06-27 MED ORDER — LISDEXAMFETAMINE DIMESYLATE 70 MG PO CAPS: 70.0000 mg | ORAL_CAPSULE | Freq: Every day | ORAL | 0 refills | Status: DC

## 2017-06-27 NOTE — Telephone Encounter (Signed)
She would like to go to a higher dose of Vyvanse. I will have her check with me after she has been on this to see how long it lasts and if any problems.

## 2017-06-27 NOTE — Telephone Encounter (Signed)
Pt has been on Vyvanse 60mg  for couple weeks and wants to go up to the 70 mg.  Please call when ready

## 2017-07-26 ENCOUNTER — Telehealth: Payer: Self-pay | Admitting: Family Medicine

## 2017-07-26 MED ORDER — LISDEXAMFETAMINE DIMESYLATE 70 MG PO CAPS: 70.0000 mg | ORAL_CAPSULE | Freq: Every day | ORAL | 0 refills | Status: DC

## 2017-07-26 NOTE — Telephone Encounter (Signed)
Pt called for 3 month refill of Vyvanse 70 mg

## 2017-08-02 DIAGNOSIS — Z803 Family history of malignant neoplasm of breast: Secondary | ICD-10-CM | POA: Diagnosis not present

## 2017-08-02 DIAGNOSIS — Z01419 Encounter for gynecological examination (general) (routine) without abnormal findings: Secondary | ICD-10-CM | POA: Diagnosis not present

## 2017-09-20 ENCOUNTER — Telehealth: Payer: Self-pay | Admitting: Family Medicine

## 2017-09-20 MED ORDER — AMPHETAMINE-DEXTROAMPHETAMINE 10 MG PO TABS: 10.0000 mg | ORAL_TABLET | Freq: Every day | ORAL | 0 refills | Status: DC

## 2017-09-20 NOTE — Telephone Encounter (Signed)
Patient called and states that she does need the Adderall for the afternoon now.  Call pt when ready [phone]

## 2017-09-25 ENCOUNTER — Telehealth: Payer: Self-pay | Admitting: Family Medicine

## 2017-09-25 NOTE — Telephone Encounter (Signed)
P.A. ADDERALL  

## 2017-10-01 NOTE — Telephone Encounter (Signed)
PA Approved

## 2017-10-03 NOTE — Telephone Encounter (Signed)
P.A. Approved til 09/25/18, left message for pt, called pharmacy, pt paid out of pocket 09/23/17.  Also Vyvanse did go thru insurance.

## 2017-10-07 DIAGNOSIS — D225 Melanocytic nevi of trunk: Secondary | ICD-10-CM | POA: Diagnosis not present

## 2017-10-07 DIAGNOSIS — D2261 Melanocytic nevi of right upper limb, including shoulder: Secondary | ICD-10-CM | POA: Diagnosis not present

## 2017-10-07 DIAGNOSIS — D485 Neoplasm of uncertain behavior of skin: Secondary | ICD-10-CM | POA: Diagnosis not present

## 2017-10-07 DIAGNOSIS — D1722 Benign lipomatous neoplasm of skin and subcutaneous tissue of left arm: Secondary | ICD-10-CM | POA: Diagnosis not present

## 2017-10-24 ENCOUNTER — Telehealth: Payer: Self-pay | Admitting: Family Medicine

## 2017-10-24 MED ORDER — LISDEXAMFETAMINE DIMESYLATE 70 MG PO CAPS: 70.0000 mg | ORAL_CAPSULE | Freq: Every day | ORAL | 0 refills | Status: DC

## 2017-10-24 NOTE — Telephone Encounter (Signed)
Pt called requesting a refill on her Vyvanse pt can be reached at [phone] when ready to be picked up

## 2017-11-18 ENCOUNTER — Telehealth: Payer: Self-pay | Admitting: Family Medicine

## 2017-11-18 ENCOUNTER — Telehealth: Payer: Self-pay

## 2017-11-18 MED ORDER — AMPHETAMINE-DEXTROAMPHETAMINE 10 MG PO TABS: ORAL_TABLET | ORAL | 0 refills | Status: DC

## 2017-11-18 MED ORDER — AMPHETAMINE-DEXTROAMPHETAMINE 10 MG PO TABS: 10.0000 mg | ORAL_TABLET | Freq: Every day | ORAL | 0 refills | Status: DC

## 2017-11-18 NOTE — Telephone Encounter (Signed)
Pt requesting refill on Adderall to [facility]

## 2017-11-18 NOTE — Telephone Encounter (Signed)
[facility] called the office to let us know that Adderall script written for #30 for 30 days with directions of Adderall. Per JCl update to bid. #60.  LM for pt to CB to notify her. Victorino December

## 2017-11-25 ENCOUNTER — Telehealth: Payer: Self-pay | Admitting: Family Medicine

## 2017-11-25 NOTE — Telephone Encounter (Signed)
ALERT PT HAS A NEW PHARMACY pt called for refills of Vyvanse. Please send to [place].

## 2017-11-25 NOTE — Telephone Encounter (Signed)
These prescriptions have already been written and should be up front or she already picked them up.

## 2017-11-28 NOTE — Telephone Encounter (Signed)
You sent all scripts electronically back on 10/27/2017. So these will need to be re-sent to  [facility]. I will call them to cancel rxs at [facility] [address] when resent. Victorino December

## 2017-11-28 NOTE — Telephone Encounter (Signed)
Pt aware that rxs can not be changed from pharmacy to pharmacy since she has refills there until Feb 2019. She was advised we can change drug store in March when she is due for further refills.Victorino December

## 2017-11-28 NOTE — Telephone Encounter (Signed)
Have her pick up the prescriptions at that drugstore and we will switch her the next time around.

## 2017-12-06 DIAGNOSIS — D225 Melanocytic nevi of trunk: Secondary | ICD-10-CM | POA: Diagnosis not present

## 2017-12-06 DIAGNOSIS — D485 Neoplasm of uncertain behavior of skin: Secondary | ICD-10-CM | POA: Diagnosis not present

## 2017-12-27 ENCOUNTER — Telehealth: Payer: Self-pay | Admitting: Family Medicine

## 2017-12-27 NOTE — Telephone Encounter (Signed)
Called and l/m for pt call us back.  

## 2017-12-27 NOTE — Telephone Encounter (Signed)
Pt called for refills of adderall. Pt uses [facility] and can be reached at [phone]

## 2017-12-27 NOTE — Telephone Encounter (Signed)
There should be a prescription that she can pick up February 6 already in the system

## 2017-12-28 ENCOUNTER — Other Ambulatory Visit: Payer: Self-pay | Admitting: Family Medicine

## 2017-12-28 NOTE — Telephone Encounter (Signed)
Is this okay to refill? 

## 2017-12-28 NOTE — Telephone Encounter (Signed)
It should already be taking care of.

## 2017-12-28 NOTE — Telephone Encounter (Signed)
Called pt to call us back to verify  Dr.Lalonde it looks like pt has not had adderall refilled since 11/18/17 only the vyvanse has. Please advise

## 2018-01-20 ENCOUNTER — Telehealth: Payer: Self-pay | Admitting: Family Medicine

## 2018-01-20 NOTE — Telephone Encounter (Signed)
Pt said the Vyvanse is making her sick on her stomach and making her gassy so she would like to change back to Adderall XR. Can she get a script to try this med or does she need to come in to be seen first?

## 2018-01-23 ENCOUNTER — Telehealth: Payer: Self-pay | Admitting: Family Medicine

## 2018-01-23 MED ORDER — AMPHETAMINE-DEXTROAMPHETAMINE 10 MG PO TABS: 10.0000 mg | ORAL_TABLET | Freq: Two times a day (BID) | ORAL | 0 refills | Status: DC

## 2018-01-23 MED ORDER — AMPHETAMINE-DEXTROAMPHET ER 15 MG PO CP24: 15.0000 mg | ORAL_CAPSULE | ORAL | 0 refills | Status: DC

## 2018-01-23 MED ORDER — AMPHETAMINE-DEXTROAMPHET ER 15 MG PO CP24: 15.0000 mg | ORAL_CAPSULE | Freq: Two times a day (BID) | ORAL | 0 refills | Status: DC

## 2018-01-23 MED ORDER — AMPHETAMINE-DEXTROAMPHETAMINE 10 MG PO TABS: 20.0000 mg | ORAL_TABLET | Freq: Every day | ORAL | 0 refills | Status: DC

## 2018-01-23 MED ORDER — AMPHETAMINE-DEXTROAMPHETAMINE 10 MG PO TABS: ORAL_TABLET | ORAL | 0 refills | Status: DC

## 2018-01-23 NOTE — Telephone Encounter (Signed)
She states that the Vyvanse is causing some GI irritation and would like to switch back to her previous dosing regimen.  I will continue her on the Adderall 10 mg that she uses usually towards the end of the day.  I will switch her to Adderall X are 15 twice daily and she will let me know how she does with this.

## 2018-01-23 NOTE — Telephone Encounter (Signed)
Pt called back and wanted a updated on her switching her medicine pt uses [facility] [place] states that the vyvanse makes her sick and would like to go back to the adderall

## 2018-01-23 NOTE — Telephone Encounter (Signed)
Pt's rx for adderall xr was sent in with incorrect instructions. States take one am and pharmacy will not fill for #60. Please correct.

## 2018-02-08 ENCOUNTER — Telehealth: Payer: Self-pay | Admitting: Family Medicine

## 2018-02-08 NOTE — Telephone Encounter (Signed)
Pt called and states new medication Adderall XR is requiring P.A.

## 2018-02-11 NOTE — Telephone Encounter (Signed)
P.A. ADDERALL XR completed and approved, left message for pt

## 2018-02-27 ENCOUNTER — Telehealth: Payer: Self-pay | Admitting: Family Medicine

## 2018-02-27 MED ORDER — AMPHETAMINE-DEXTROAMPHET ER 20 MG PO CP24: 20.0000 mg | ORAL_CAPSULE | Freq: Two times a day (BID) | ORAL | 0 refills | Status: DC

## 2018-02-27 NOTE — Telephone Encounter (Signed)
Pt states Adderall XR working just thinks it's not enough, helping some, not much, doesn't want to switch just wants to try higher dose to [facility] on [facility]

## 2018-03-11 ENCOUNTER — Telehealth: Payer: Self-pay | Admitting: Family Medicine

## 2018-03-11 NOTE — Telephone Encounter (Signed)
P.A. ADDERALL XR 20MG 

## 2018-03-18 NOTE — Telephone Encounter (Signed)
P.A. Quantity limits approved til 03/12/19, left message for pt

## 2018-04-13 ENCOUNTER — Other Ambulatory Visit: Payer: Self-pay | Admitting: Family Medicine

## 2018-04-13 ENCOUNTER — Telehealth: Payer: Self-pay | Admitting: Family Medicine

## 2018-04-13 MED ORDER — AMPHETAMINE-DEXTROAMPHET ER 20 MG PO CP24: 20.0000 mg | ORAL_CAPSULE | Freq: Two times a day (BID) | ORAL | 0 refills | Status: DC

## 2018-04-13 NOTE — Telephone Encounter (Signed)
Pt called requesting refill on Adderall XR 20 mg only from [facility]

## 2018-04-13 NOTE — Telephone Encounter (Signed)
[facility] is requestoing to fill pt adderall. Please advise [facility]

## 2018-06-01 ENCOUNTER — Other Ambulatory Visit: Payer: Self-pay | Admitting: Family Medicine

## 2018-06-02 NOTE — Telephone Encounter (Signed)
Needs visit.   Last visit 1 year ago

## 2018-06-02 NOTE — Telephone Encounter (Signed)
[facility] is requesting to fill pt adderall. Please advise [facility]

## 2018-06-05 NOTE — Telephone Encounter (Signed)
Called pt back and left voicemail that rx was sent but not able to be filled until the 23rd of this month, but also needs a appt,

## 2018-06-05 NOTE — Telephone Encounter (Signed)
Called and informed pt that RX was sent to the Pharmacy and that she need to make a appt

## 2018-06-08 DIAGNOSIS — Z130 Encounter for screening for diseases of the blood and blood-forming organs and certain disorders involving the immune mechanism: Secondary | ICD-10-CM | POA: Diagnosis not present

## 2018-06-08 DIAGNOSIS — R51 Headache: Secondary | ICD-10-CM | POA: Diagnosis not present

## 2018-07-14 ENCOUNTER — Telehealth: Payer: Self-pay | Admitting: Family Medicine

## 2018-07-14 ENCOUNTER — Other Ambulatory Visit: Payer: Self-pay | Admitting: Family Medicine

## 2018-07-14 MED ORDER — AMPHETAMINE-DEXTROAMPHETAMINE 10 MG PO TABS: 20.0000 mg | ORAL_TABLET | Freq: Every day | ORAL | 0 refills | Status: DC

## 2018-07-14 MED ORDER — AMPHETAMINE-DEXTROAMPHET ER 20 MG PO CP24: 20.0000 mg | ORAL_CAPSULE | Freq: Two times a day (BID) | ORAL | 0 refills | Status: DC

## 2018-07-14 NOTE — Telephone Encounter (Signed)
Pt needs Adderall refill to [facility]

## 2018-07-14 NOTE — Telephone Encounter (Signed)
[facility] is requesting to fill pt adderall. Please advise [facility]

## 2018-08-14 ENCOUNTER — Other Ambulatory Visit: Payer: Self-pay | Admitting: Family Medicine

## 2018-08-14 NOTE — Telephone Encounter (Signed)
Is this okay to refill? 

## 2018-08-22 ENCOUNTER — Other Ambulatory Visit: Payer: Self-pay | Admitting: Family Medicine

## 2018-08-23 NOTE — Telephone Encounter (Signed)
[facility] is requesting to fill adderall. Please advise [facility]

## 2018-10-30 ENCOUNTER — Other Ambulatory Visit: Payer: Self-pay | Admitting: Family Medicine

## 2018-10-30 NOTE — Telephone Encounter (Signed)
[facility] is requesting to fill pt adderall. Please advise [facility]

## 2018-10-31 NOTE — Telephone Encounter (Signed)
Pt called and stated that the 10mg  is the correct dose. She stated she got a call from [facility] last night asking for verification.

## 2018-11-01 NOTE — Telephone Encounter (Signed)
Adderall 10 mg is taken in the afternoons only per pt. Advised Dr Redmond School.

## 2018-11-02 ENCOUNTER — Ambulatory Visit (INDEPENDENT_AMBULATORY_CARE_PROVIDER_SITE_OTHER): Payer: 59 | Admitting: Family Medicine

## 2018-11-02 ENCOUNTER — Encounter: Payer: Self-pay | Admitting: Family Medicine

## 2018-11-02 VITALS — BP 108/70 | HR 68 | Temp 98.7°F | Ht 64.0 in | Wt 165.0 lb

## 2018-11-02 DIAGNOSIS — J020 Streptococcal pharyngitis: Secondary | ICD-10-CM | POA: Diagnosis not present

## 2018-11-02 DIAGNOSIS — J029 Acute pharyngitis, unspecified: Secondary | ICD-10-CM | POA: Diagnosis not present

## 2018-11-02 LAB — POCT RAPID STREP A (OFFICE): Rapid Strep A Screen: POSITIVE — AB

## 2018-11-02 MED ORDER — AMOXICILLIN 875 MG PO TABS: 875.0000 mg | ORAL_TABLET | Freq: Two times a day (BID) | ORAL | 0 refills | Status: DC

## 2018-11-02 NOTE — Progress Notes (Signed)
Chief Complaint  Patient presents with  . Sore Throat    started last Friday, dry cough started a few days ago. No fevers but says she doesn't ever run a fever. Says it feels like she is swallowing razor blades.    Sore throat started 6 days ago, and has progressively gotten worse.  This is different than her usual colds (which the sore throat is short-lived). Her children have colds currently.  No known strep exposure.  She denies runny nose, but is having some postnasal drainage, snoring and cough started about 3 days ago. She looked at her throat, and saw that her tonsils were "huge", worse on the on the right (size, pain).  She has some tender, swollen glands, more on the right.  No known fever or chills, but has taken ibuprofen for the discomfort  PMH, PSH, SH reviewed  Outpatient Encounter Medications as of 11/02/2018  Medication Sig Note  . amphetamine-dextroamphetamine (ADDERALL XR) 20 MG 24 hr capsule Take 1 capsule (20 mg total) by mouth 2 (two) times daily after a meal.   . amphetamine-dextroamphetamine (ADDERALL) 10 MG tablet Take 1 tablet (10 mg total) by mouth daily.   Marland Kitchen ibuprofen (ADVIL,MOTRIN) 800 MG tablet Take 800 mg by mouth every 8 (eight) hours as needed. 11/02/2018: Last dose 6am  . sertraline (ZOLOFT) 100 MG tablet sertraline 100 mg tablet   . [DISCONTINUED] ADDERALL XR 20 MG 24 hr capsule TAKE 1 CAPSULE (20 MG TOTAL) BY MOUTH 2 (TWO) TIMES DAILY AFTER A MEAL. *DO NOT FILL BEFORE 06/13/18*   . ALPRAZolam (XANAX) 0.25 MG tablet Take 1 tablet (0.25 mg total) by mouth 2 (two) times daily as needed for anxiety. (Patient not taking: Reported on 11/02/2018)   . amoxicillin (AMOXIL) 875 MG tablet Take 1 tablet (875 mg total) by mouth 2 (two) times daily.   . [DISCONTINUED] amphetamine-dextroamphetamine (ADDERALL XR) 20 MG 24 hr capsule Take 1 capsule (20 mg total) by mouth 2 (two) times daily.   . [DISCONTINUED] amphetamine-dextroamphetamine (ADDERALL XR) 20 MG 24 hr capsule  Take 1 capsule (20 mg total) by mouth 2 (two) times daily.    No facility-administered encounter medications on file as of 11/02/2018.    (not taking amoxil prior to today's visit, started today)  No Known Allergies  ROS: No known fever, headaches, dizziness.  Feels "like I'm in the clouds" slightly. No nausea, vomiting, diarrhea.  Denies rash--had some redness on the front of her neck this morning, splotchy appearing, not raised. No chest pain, shortness of breath, muscle aches or other concerns, except as noted in HPI   PHYSICAL EXAM:  BP 108/70   Pulse 68   Temp 98.7 F (37.1 C) (Tympanic)   Ht 5\' 4"  (1.626 m)   Wt 165 lb (74.8 kg)   LMP 10/19/2018 (Approximate)   Breastfeeding No   BMI 28.32 kg/m   Well appearing, pleasant female, in no distress HEENT: PERRL, EOMI, conjunctiva and sclera are normal. TM's and EAC's normal.  Nasal mucosa is mildly edematous, no erythema or drainage. Sinuses are nontender OP: erythema and mild enlargement of tonsils bilaterally.  The right tonsil is larger and has overlying exudate Neck: tender R>L anterior cervical lymphadenopathy Heart: regular rate and rhythm Lungs clear bilaterally Skin: no rashes, normal turgor Neuro: alert and oriented, cranial nerves intact Psych: normal mood, affect, hygiene and grooming  Rapid strep POSITIVE  ASSESSMENT/PLAN:  Strep pharyngitis - Plan: amoxicillin (AMOXIL) 875 MG tablet  Sore throat - Plan: Rapid Strep  A  May also have component of URI (from her sick children). Supportive measures reviewed.   Use tylenol and/or ibuprofen as needed for fever or pain. Salt water gargles and/or chloraseptic spray as needed for pain. You may use guaifenesin (mucinex, robitussin, plain or DM versions if having a lot of cough) to help with the thick mucous.  Contact us if sore throat isn't resolving after 48 hours, if persistent fever, worsening sore throat, or other new symptoms.

## 2018-11-02 NOTE — Patient Instructions (Signed)
Use tylenol and/or ibuprofen as needed for fever or pain. Salt water gargles and/or chloraseptic spray as needed for pain. You may use guaifenesin (mucinex, robitussin, plain or DM versions if having a lot of cough) to help with the thick mucous.  Contact us if sore throat isn't resolving after 48 hours, if persistent fever, worsening sore throat, or other new symptoms.    Strep Throat Strep throat is a bacterial infection of the throat. Your health care provider may call the infection tonsillitis or pharyngitis, depending on whether there is swelling in the tonsils or at the back of the throat. Strep throat is most common during the cold months of the year in children who are 62-22 years of age, but it can happen during any season in people of any age. This infection is spread from person to person (contagious) through coughing, sneezing, or close contact. What are the causes? Strep throat is caused by the bacteria called Streptococcus pyogenes. What increases the risk? This condition is more likely to develop in:  People who spend time in crowded places where the infection can spread easily.  People who have close contact with someone who has strep throat.  What are the signs or symptoms? Symptoms of this condition include:  Fever or chills.  Redness, swelling, or pain in the tonsils or throat.  Pain or difficulty when swallowing.  White or yellow spots on the tonsils or throat.  Swollen, tender glands in the neck or under the jaw.  Red rash all over the body (rare).  How is this diagnosed? This condition is diagnosed by performing a rapid strep test or by taking a swab of your throat (throat culture test). Results from a rapid strep test are usually ready in a few minutes, but throat culture test results are available after one or two days. How is this treated? This condition is treated with antibiotic medicine. Follow these instructions at home: Medicines  Take  over-the-counter and prescription medicines only as told by your health care provider.  Take your antibiotic as told by your health care provider. Do not stop taking the antibiotic even if you start to feel better.  Have family members who also have a sore throat or fever tested for strep throat. They may need antibiotics if they have the strep infection. Eating and drinking  Do not share food, drinking cups, or personal items that could cause the infection to spread to other people.  If swallowing is difficult, try eating soft foods until your sore throat feels better.  Drink enough fluid to keep your urine clear or pale yellow. General instructions  Gargle with a salt-water mixture 3-4 times per day or as needed. To make a salt-water mixture, completely dissolve -1 tsp of salt in 1 cup of warm water.  Make sure that all household members wash their hands well.  Get plenty of rest.  Stay home from school or work until you have been taking antibiotics for 24 hours.  Keep all follow-up visits as told by your health care provider. This is important. Contact a health care provider if:  The glands in your neck continue to get bigger.  You develop a rash, cough, or earache.  You cough up a thick liquid that is green, yellow-brown, or bloody.  You have pain or discomfort that does not get better with medicine.  Your problems seem to be getting worse rather than better.  You have a fever. Get help right away if:  You have  new symptoms, such as vomiting, severe headache, stiff or painful neck, chest pain, or shortness of breath.  You have severe throat pain, drooling, or changes in your voice.  You have swelling of the neck, or the skin on the neck becomes red and tender.  You have signs of dehydration, such as fatigue, dry mouth, and decreased urination.  You become increasingly sleepy, or you cannot wake up completely.  Your joints become red or painful. This information  is not intended to replace advice given to you by your health care provider. Make sure you discuss any questions you have with your health care provider. Document Released: 11/05/2000 Document Revised: 07/07/2016 Document Reviewed: 03/03/2015 Elsevier Interactive Patient Education  [employer].

## 2018-11-09 ENCOUNTER — Other Ambulatory Visit: Payer: Self-pay | Admitting: Family Medicine

## 2018-11-09 MED ORDER — AMPHETAMINE-DEXTROAMPHET ER 20 MG PO CP24: 20.0000 mg | ORAL_CAPSULE | Freq: Two times a day (BID) | ORAL | 0 refills | Status: DC

## 2018-11-09 NOTE — Telephone Encounter (Signed)
[facility] is requesting to  Fill pt adderral. Please advise [facility]

## 2018-12-02 ENCOUNTER — Other Ambulatory Visit: Payer: Self-pay | Admitting: Family Medicine

## 2018-12-04 NOTE — Telephone Encounter (Signed)
[facility] is requesting to fill pt adderall. Please advise [facility]

## 2019-01-02 ENCOUNTER — Inpatient Hospital Stay (HOSPITAL_COMMUNITY)
Admission: EM | Admit: 2019-01-02 | Discharge: 2019-01-06 | DRG: 064 | Disposition: A | Discharge status: Rehabilitation Facility | Payer: 59 | Attending: Neurology | Admitting: Neurology

## 2019-01-02 ENCOUNTER — Other Ambulatory Visit: Payer: Self-pay

## 2019-01-02 ENCOUNTER — Emergency Department (HOSPITAL_COMMUNITY): Payer: 59

## 2019-01-02 DIAGNOSIS — E876 Hypokalemia: Secondary | ICD-10-CM | POA: Diagnosis present

## 2019-01-02 DIAGNOSIS — I10 Essential (primary) hypertension: Secondary | ICD-10-CM | POA: Diagnosis not present

## 2019-01-02 DIAGNOSIS — G8191 Hemiplegia, unspecified affecting right dominant side: Secondary | ICD-10-CM | POA: Diagnosis present

## 2019-01-02 DIAGNOSIS — J45990 Exercise induced bronchospasm: Secondary | ICD-10-CM | POA: Diagnosis not present

## 2019-01-02 DIAGNOSIS — R269 Unspecified abnormalities of gait and mobility: Secondary | ICD-10-CM | POA: Diagnosis not present

## 2019-01-02 DIAGNOSIS — R29702 NIHSS score 2: Secondary | ICD-10-CM | POA: Diagnosis present

## 2019-01-02 DIAGNOSIS — F909 Attention-deficit hyperactivity disorder, unspecified type: Secondary | ICD-10-CM | POA: Diagnosis present

## 2019-01-02 DIAGNOSIS — R531 Weakness: Secondary | ICD-10-CM | POA: Diagnosis not present

## 2019-01-02 DIAGNOSIS — F410 Panic disorder [episodic paroxysmal anxiety] without agoraphobia: Secondary | ICD-10-CM | POA: Diagnosis not present

## 2019-01-02 DIAGNOSIS — I6501 Occlusion and stenosis of right vertebral artery: Secondary | ICD-10-CM | POA: Diagnosis not present

## 2019-01-02 DIAGNOSIS — G936 Cerebral edema: Secondary | ICD-10-CM | POA: Diagnosis not present

## 2019-01-02 DIAGNOSIS — R42 Dizziness and giddiness: Secondary | ICD-10-CM | POA: Diagnosis not present

## 2019-01-02 DIAGNOSIS — K5900 Constipation, unspecified: Secondary | ICD-10-CM | POA: Diagnosis present

## 2019-01-02 DIAGNOSIS — I639 Cerebral infarction, unspecified: Secondary | ICD-10-CM | POA: Diagnosis present

## 2019-01-02 DIAGNOSIS — F329 Major depressive disorder, single episode, unspecified: Secondary | ICD-10-CM | POA: Diagnosis present

## 2019-01-02 DIAGNOSIS — I63533 Cerebral infarction due to unspecified occlusion or stenosis of bilateral posterior cerebral arteries: Secondary | ICD-10-CM | POA: Diagnosis not present

## 2019-01-02 DIAGNOSIS — I6930 Unspecified sequelae of cerebral infarction: Secondary | ICD-10-CM

## 2019-01-02 DIAGNOSIS — I69398 Other sequelae of cerebral infarction: Secondary | ICD-10-CM | POA: Diagnosis not present

## 2019-01-02 DIAGNOSIS — Z6832 Body mass index (BMI) 32.0-32.9, adult: Secondary | ICD-10-CM | POA: Diagnosis not present

## 2019-01-02 DIAGNOSIS — Z79899 Other long term (current) drug therapy: Secondary | ICD-10-CM | POA: Diagnosis not present

## 2019-01-02 DIAGNOSIS — I69393 Ataxia following cerebral infarction: Secondary | ICD-10-CM | POA: Diagnosis not present

## 2019-01-02 DIAGNOSIS — R030 Elevated blood-pressure reading, without diagnosis of hypertension: Secondary | ICD-10-CM | POA: Diagnosis present

## 2019-01-02 DIAGNOSIS — I7774 Dissection of vertebral artery: Secondary | ICD-10-CM | POA: Diagnosis present

## 2019-01-02 DIAGNOSIS — F411 Generalized anxiety disorder: Secondary | ICD-10-CM | POA: Diagnosis not present

## 2019-01-02 DIAGNOSIS — K5901 Slow transit constipation: Secondary | ICD-10-CM | POA: Diagnosis not present

## 2019-01-02 DIAGNOSIS — E669 Obesity, unspecified: Secondary | ICD-10-CM | POA: Diagnosis present

## 2019-01-02 DIAGNOSIS — R52 Pain, unspecified: Secondary | ICD-10-CM | POA: Diagnosis not present

## 2019-01-02 DIAGNOSIS — R270 Ataxia, unspecified: Secondary | ICD-10-CM | POA: Diagnosis not present

## 2019-01-02 DIAGNOSIS — E785 Hyperlipidemia, unspecified: Secondary | ICD-10-CM | POA: Diagnosis present

## 2019-01-02 DIAGNOSIS — R064 Hyperventilation: Secondary | ICD-10-CM | POA: Diagnosis not present

## 2019-01-02 DIAGNOSIS — I69354 Hemiplegia and hemiparesis following cerebral infarction affecting left non-dominant side: Secondary | ICD-10-CM | POA: Diagnosis not present

## 2019-01-02 DIAGNOSIS — Z8673 Personal history of transient ischemic attack (TIA), and cerebral infarction without residual deficits: Secondary | ICD-10-CM | POA: Diagnosis present

## 2019-01-02 DIAGNOSIS — R0689 Other abnormalities of breathing: Secondary | ICD-10-CM | POA: Diagnosis not present

## 2019-01-02 HISTORY — DX: Unspecified sequelae of cerebral infarction: I69.30

## 2019-01-02 LAB — CBC WITH DIFFERENTIAL/PLATELET
Abs Immature Granulocytes: 0.11 10*3/uL — ABNORMAL HIGH (ref 0.00–0.07)
Basophils Absolute: 0.1 10*3/uL (ref 0.0–0.1)
Basophils Relative: 0 %
Eosinophils Absolute: 0.1 10*3/uL (ref 0.0–0.5)
Eosinophils Relative: 1 %
HCT: 42.4 % (ref 36.0–46.0)
Hemoglobin: 13.9 g/dL (ref 12.0–15.0)
Immature Granulocytes: 1 %
Lymphocytes Relative: 12 %
Lymphs Abs: 1.6 10*3/uL (ref 0.7–4.0)
MCH: 29.6 pg (ref 26.0–34.0)
MCHC: 32.8 g/dL (ref 30.0–36.0)
MCV: 90.4 fL (ref 80.0–100.0)
Monocytes Absolute: 0.8 10*3/uL (ref 0.1–1.0)
Monocytes Relative: 6 %
Neutro Abs: 10.6 10*3/uL — ABNORMAL HIGH (ref 1.7–7.7)
Neutrophils Relative %: 80 %
Platelets: 295 10*3/uL (ref 150–400)
RBC: 4.69 MIL/uL (ref 3.87–5.11)
RDW: 13.4 % (ref 11.5–15.5)
WBC: 13.2 10*3/uL — ABNORMAL HIGH (ref 4.0–10.5)
nRBC: 0 % (ref 0.0–0.2)

## 2019-01-02 LAB — URINALYSIS, ROUTINE W REFLEX MICROSCOPIC
Bilirubin Urine: NEGATIVE
Glucose, UA: 150 mg/dL — AB
Ketones, ur: NEGATIVE mg/dL
Leukocytes,Ua: NEGATIVE
Nitrite: NEGATIVE
Protein, ur: NEGATIVE mg/dL
Specific Gravity, Urine: 1.014 (ref 1.005–1.030)
pH: 6 (ref 5.0–8.0)

## 2019-01-02 LAB — COMPREHENSIVE METABOLIC PANEL
ALT: 23 U/L (ref 0–44)
AST: 29 U/L (ref 15–41)
Albumin: 4.3 g/dL (ref 3.5–5.0)
Alkaline Phosphatase: 73 U/L (ref 38–126)
Anion gap: 9 (ref 5–15)
BUN: 11 mg/dL (ref 6–20)
CO2: 20 mmol/L — ABNORMAL LOW (ref 22–32)
Calcium: 8.2 mg/dL — ABNORMAL LOW (ref 8.9–10.3)
Chloride: 108 mmol/L (ref 98–111)
Creatinine, Ser: 0.63 mg/dL (ref 0.44–1.00)
GFR calc Af Amer: >60 mL/min (ref >60)
GFR calc non Af Amer: >60 mL/min (ref >60)
Glucose, Bld: 179 mg/dL — ABNORMAL HIGH (ref 70–99)
Potassium: 3.3 mmol/L — ABNORMAL LOW (ref 3.5–5.1)
Sodium: 137 mmol/L (ref 135–145)
Total Bilirubin: 0.1 mg/dL — ABNORMAL LOW (ref 0.3–1.2)
Total Protein: 7.5 g/dL (ref 6.5–8.1)

## 2019-01-02 LAB — LACTIC ACID, PLASMA: Lactic Acid, Venous: 1.5 mmol/L (ref 0.5–1.9)

## 2019-01-02 LAB — TSH: TSH: 1.321 u[IU]/mL (ref 0.350–4.500)

## 2019-01-02 LAB — RAPID URINE DRUG SCREEN, HOSP PERFORMED
Amphetamines: POSITIVE — AB
Barbiturates: NOT DETECTED
Benzodiazepines: NOT DETECTED
Cocaine: NOT DETECTED
Opiates: NOT DETECTED
Tetrahydrocannabinol: NOT DETECTED

## 2019-01-02 LAB — AMMONIA: Ammonia: 18 umol/L (ref 9–35)

## 2019-01-02 LAB — SODIUM: Sodium: 137 mmol/L (ref 135–145)

## 2019-01-02 LAB — SALICYLATE LEVEL: Salicylate Lvl: <7 mg/dL (ref 2.8–30.0)

## 2019-01-02 LAB — ACETAMINOPHEN LEVEL: Acetaminophen (Tylenol), Serum: <10 ug/mL — ABNORMAL LOW (ref 10–30)

## 2019-01-02 LAB — ETHANOL: Alcohol, Ethyl (B): <10 mg/dL (ref <10)

## 2019-01-02 MED ORDER — IOPAMIDOL (ISOVUE-370) INJECTION 76%
100.0000 mL | Freq: Once | INTRAVENOUS | Status: AC | PRN
  Administered 2019-01-02: 80 mL via INTRAVENOUS

## 2019-01-02 MED ORDER — SENNOSIDES-DOCUSATE SODIUM 8.6-50 MG PO TABS: 1.0000 | ORAL_TABLET | Freq: Every evening | ORAL | Status: DC | PRN

## 2019-01-02 MED ORDER — ACETAMINOPHEN 650 MG RE SUPP: 650.0000 mg | RECTAL | Status: DC | PRN

## 2019-01-02 MED ORDER — DIPHENHYDRAMINE HCL 50 MG/ML IJ SOLN: 25.0000 mg | Freq: Once | INTRAMUSCULAR | Status: DC

## 2019-01-02 MED ORDER — ACETAMINOPHEN 325 MG PO TABS
650.0000 mg | ORAL_TABLET | ORAL | Status: DC | PRN
  Administered 2019-01-04 – 2019-01-06 (×8): 650 mg via ORAL
  Filled 2019-01-02 (×8): qty 2

## 2019-01-02 MED ORDER — LIDOCAINE HCL (PF) 1 % IJ SOLN: 5.0000 mL | Freq: Once | INTRAMUSCULAR | Status: DC

## 2019-01-02 MED ORDER — SODIUM CHLORIDE 0.9 % IV BOLUS
500.0000 mL | Freq: Once | INTRAVENOUS | Status: AC
  Administered 2019-01-02: 500 mL via INTRAVENOUS

## 2019-01-02 MED ORDER — LACTATED RINGERS IV BOLUS
1000.0000 mL | Freq: Once | INTRAVENOUS | Status: AC
  Administered 2019-01-02: 1000 mL via INTRAVENOUS

## 2019-01-02 MED ORDER — STROKE: EARLY STAGES OF RECOVERY BOOK
Freq: Once | Status: AC
  Administered 2019-01-03: 06:00:00
  Filled 2019-01-02 (×2): qty 1

## 2019-01-02 MED ORDER — SODIUM CHLORIDE (PF) 0.9 % IJ SOLN
INTRAMUSCULAR | Status: AC
  Administered 2019-01-03: 02:00:00
  Filled 2019-01-02: qty 50

## 2019-01-02 MED ORDER — ACETAMINOPHEN 160 MG/5ML PO SOLN: 650.0000 mg | ORAL | Status: DC | PRN

## 2019-01-02 MED ORDER — DIPHENHYDRAMINE HCL 50 MG/ML IJ SOLN
50.0000 mg | Freq: Once | INTRAMUSCULAR | Status: AC
  Administered 2019-01-02: 50 mg via INTRAVENOUS
  Filled 2019-01-02: qty 1

## 2019-01-02 MED ORDER — SODIUM CHLORIDE 3 % IV SOLN
INTRAVENOUS | Status: AC
  Administered 2019-01-02 – 2019-01-03 (×5): 75 mL/h via INTRAVENOUS
  Filled 2019-01-02 (×4): qty 500

## 2019-01-02 MED ORDER — HYDROXYZINE HCL 50 MG PO TABS: 50.0000 mg | ORAL_TABLET | Freq: Four times a day (QID) | ORAL | 0 refills | Status: DC | PRN

## 2019-01-02 MED ORDER — IOPAMIDOL (ISOVUE-370) INJECTION 76%
INTRAVENOUS | Status: AC
  Filled 2019-01-02: qty 100

## 2019-01-02 NOTE — ED Notes (Signed)
Pts husband states he must leave to get his kids. Request for staff to contact him with any updates. [place], [phone]

## 2019-01-02 NOTE — ED Provider Notes (Addendum)
[facility] DEPT Provider Note   CSN: [id number] Arrival date & time: 01/02/19  0806     History   Chief Complaint Chief Complaint  Patient presents with  . Anxiety    panic     HPI [name] is a 38 y.o. female with a past medical history of ADD, asthma, status post bilateral tubal ligation, who presents today for evaluation of a panic attack.  According to triage note EMS gave patient 5 of Haldol to try and treat her panic attack in addition to 4 of Zofran.  Patient is currently unable to give full coherent history, suspect secondary to Haldol.    She tells me that today she has taken Phenergan, and Tylenol.  She tells me she took one Tylenol as she has been sick and not feeling well recently however then tells me she does not know how many.  She says she took them not as a attempt to harm herself.  She is unable to tell me why she is been anxious.  She denies any pains right now.   HPI  Past Medical History:  Diagnosis Date  . ADD (attention deficit disorder)   . Exercise-induced asthma   . Hx of varicella   . Kidney stones   . S/P cesarean section 05/05/2016  . S/P tubal ligation 05/05/2016  . Term pregnancy, repeat 05/04/2016    Patient Active Problem List   Diagnosis Date Noted  . S/P cesarean section 05/05/2016  . S/P tubal ligation 05/05/2016  . Term pregnancy, repeat 05/04/2016  . Unspecified vitamin D deficiency 12/18/2012  . ADHD (attention deficit hyperactivity disorder) 11/26/2011  . Dysthymia 11/26/2011    Past Surgical History:  Procedure Laterality Date  . BACK SURGERY     scoliosis age 40  . BILATERAL SALPINGECTOMY Bilateral 05/05/2016   Procedure: BILATERAL SALPINGECTOMY;  Surgeon: Janyth Contes, MD;  Location: [facility];  Service: Obstetrics;  Laterality: Bilateral;  . CESAREAN SECTION    . CESAREAN SECTION N/A 05/05/2016   Procedure: CESAREAN SECTION;  Surgeon: Janyth Contes, MD;  Location:  [place] [facility];  Service: Obstetrics;  Laterality: N/A;  . CHOLECYSTECTOMY    . HAND SURGERY    . HERNIA REPAIR    . KIDNEY STONE SURGERY     R--surgery; L lithotripsy.  Dr. Gaynelle Arabian     OB History    Gravida  5   Para  4   Term  4   Preterm      AB  1   Living  4     SAB      TAB      Ectopic  1   Multiple  0   Live Births  4            Home Medications    Prior to Admission medications   Medication Sig Start Date End Date Taking? Authorizing Provider  ADDERALL XR 20 MG 24 hr capsule TAKE 1 CAPSULE BY MOUTH 2 TIMES DAILY. EFFECTIVE 08/14/18 Patient taking differently: Take 20 mg by mouth 2 (two) times daily.  01/10/19  Yes Denita Lung, MD  amphetamine-dextroamphetamine (ADDERALL) 10 MG tablet TAKE 1 TABLET (10 MG TOTAL) BY MOUTH DAILY. 12/04/18  Yes Denita Lung, MD  ibuprofen (ADVIL,MOTRIN) 200 MG tablet Take 800 mg by mouth every 6 (six) hours as needed for moderate pain.   Yes [provider]  sertraline (ZOLOFT) 100 MG tablet Take 100 mg by mouth at bedtime.  Yes [provider]  ALPRAZolam (XANAX) 0.25 MG tablet Take 1 tablet (0.25 mg total) by mouth 2 (two) times daily as needed for anxiety. Patient not taking: Reported on 11/02/2018 11/10/16   Denita Lung, MD  amoxicillin (AMOXIL) 875 MG tablet Take 1 tablet (875 mg total) by mouth 2 (two) times daily. Patient not taking: Reported on 01/02/2019 11/02/18   Rita Ohara, MD  amphetamine-dextroamphetamine (ADDERALL XR) 20 MG 24 hr capsule Take 1 capsule (20 mg total) by mouth 2 (two) times daily. Patient not taking: Reported on 01/02/2019 11/09/18   Denita Lung, MD  amphetamine-dextroamphetamine (ADDERALL XR) 20 MG 24 hr capsule Take 1 capsule (20 mg total) by mouth 2 (two) times daily. Patient not taking: Reported on 01/02/2019 11/09/18   Denita Lung, MD  hydrOXYzine (ATARAX/VISTARIL) 50 MG tablet Take 1 tablet (50 mg total) by mouth every 6 (six) hours as needed for  anxiety. 01/02/19   Lorin Glass, PA-C    Family History Family History  Problem Relation Age of Onset  . Cancer Mother   . Cancer Maternal Aunt   . Cancer Maternal Grandmother     Social History Social History   Tobacco Use  . Smoking status: Never Smoker  . Smokeless tobacco: Never Used  Substance Use Topics  . Alcohol use: No    Comment: occasional wine  . Drug use: No     Allergies   Patient has no known allergies.   Review of Systems Review of Systems  Unable to perform ROS: Mental status change     Physical Exam Updated Vital Signs BP (!) 154/128   Pulse 97   Temp 98.1 F (36.7 C) (Oral)   Resp 20   Ht 5\' 4"  (1.626 m)   Wt 86.2 kg   LMP 12/17/2018 (Approximate)   SpO2 97%   BMI 32.61 kg/m   Physical Exam Vitals signs and nursing note reviewed.  Constitutional:      General: She is not in acute distress.    Appearance: She is well-developed. She is not diaphoretic.  HENT:     Head: Normocephalic and atraumatic.     Mouth/Throat:     Mouth: Mucous membranes are moist.  Eyes:     Conjunctiva/sclera: Conjunctivae normal.     Pupils: Pupils are equal, round, and reactive to light.  Neck:     Musculoskeletal: Normal range of motion and neck supple.  Cardiovascular:     Rate and Rhythm: Normal rate and regular rhythm.     Pulses: Normal pulses.     Heart sounds: Normal heart sounds. No murmur.  Pulmonary:     Effort: Pulmonary effort is normal. No respiratory distress.     Breath sounds: Normal breath sounds. No wheezing.  Abdominal:     General: Abdomen is flat. Bowel sounds are normal. There is no distension.     Palpations: Abdomen is soft.     Tenderness: There is no abdominal tenderness.  Skin:    General: Skin is warm and dry.  Neurological:     Mental Status: She is alert.     Comments: Patient is lethargic, falls asleep and frequently has to be re-awakened voice.  She Is able to answer questions with 1-2 words before falling  back asleep.  She has slightly slurred speech.    Psychiatric:     Comments: Reportedly anxious, unable to assess now secondary to sedation.       ED Treatments / Results  Labs (  all labs ordered are listed, but only abnormal results are displayed) Labs Reviewed  CBC WITH DIFFERENTIAL/PLATELET - Abnormal; Notable for the following components:      Result Value   WBC 13.2 (*)    Neutro Abs 10.6 (*)    Abs Immature Granulocytes 0.11 (*)    All other components within normal limits  COMPREHENSIVE METABOLIC PANEL - Abnormal; Notable for the following components:   Potassium 3.3 (*)    CO2 20 (*)    Glucose, Bld 179 (*)    Calcium 8.2 (*)    Total Bilirubin 0.1 (*)    All other components within normal limits  ACETAMINOPHEN LEVEL - Abnormal; Notable for the following components:   Acetaminophen (Tylenol), Serum <10 (*)    All other components within normal limits  SALICYLATE LEVEL    EKG None  Radiology No results found.  Procedures Procedures (including critical care time)  Medications Ordered in ED Medications  sodium chloride 0.9 % bolus 500 mL (0 mLs Intravenous Stopped 01/02/19 1221)  sodium chloride 0.9 % bolus 500 mL (0 mLs Intravenous Stopped 01/02/19 1521)     Initial Impression / Assessment and Plan / ED Course  I have reviewed the triage vital signs and the nursing notes.  Pertinent labs & imaging results that were available during my care of the patient were reviewed by me and considered in my medical decision making (see chart for details).  Clinical Course as of Jan 02 1631  Tue Jan 02, 2019  1026 Patient reevaluated, she is still sleeping she will awaken to loud voice.  Her husband and their pastors are in the room and provide additional history.  Patient woke up very anxious this morning and was trying to help herself calm down however was unable to.  She has been compliant with her at home medications.     [EH]  1314 Patient reevaluated, she will awake  to sternal rub however quickly falls back asleep.   [EH]  1510 Patient reevaluated, she is more awake now, however is still lethargic and falls back asleep, however she will not awaken to her name.   [EH]  1548 Patient is starting to wake up.  She is still lethargic, is not able to speak coherently.  Is talking to relatives that are not there.  Suspect that this is sedation/drug related rather than hallucinations.   [EH]    Clinical Course User Index [EH] Lorin Glass, PA-C   Patient presents today for evaluation of a panic attack.  Prior to arrival she was given 5 mg of Haldol by EMS.  She was observed in the emergency room for multiple hours.  And at the time of my shift change after approximately 8 hours she still is unable to safely walk and remain sedated from the Haldol.  Her husband is at her side.  We discussed disposition and he will set up a follow-up appointment with her PCP for tomorrow or referral to a psychiatrist.  He requested medicine to be given as needed for panic attacks, given prescription for Atarax.  Patient is given work note.   Patient and husband deny concern for SI.  She has a history of panic attacks and this is normal for her.    At shift change care was transferred to Doreen Salvage PA-C who will follow pending studies, re-evaulate and determine disposition.      Final Clinical Impressions(s) / ED Diagnoses   Final diagnoses:  Panic attack  ED Discharge Orders         Ordered    hydrOXYzine (ATARAX/VISTARIL) 50 MG tablet  Every 6 hours PRN     01/02/19 1538           Lorin Glass, [place] 01/02/19 1634    Hayden Rasmussen, MD 01/02/19 1750   Addendum 01/03/19  Physical exam should read she is not alert.   On initial exam patient was able to answer questions appropriately after being awakened, however would not consistently give the same answer.  Occasionally after falling asleep during questions she would require physical  stimulation/gentle sternal rub to wake back up. She had 5/5 grip strength in bilateral arms with prompting and was able to lift both legs off the bed.    When patient was re-evaluated at 71 her husband stated that she had been well recently with no physical complaints.  She was sick with "a cold last week" but that fully resolved.  He said she did not take any tylenol or phenergan this morning.  He says that they have been meaning to get in to a mental health specialist as he feels like her ADD/ADHD medication is making her more anxious.  She has a multi-year history of panic attacks that have been becoming more frequent but with this episode she was not able to calm her self like she usually can.  Other than that, husband states this was a usual "panic attack" for her, and that she is usually sleepy after.  He denies recent trauma.  At that time she was spontaneously moving all extremities with strong and equal grip strength, able to lift both legs off bed, which was unchanged from initial exam.  Tongue protruded midline with no evidence of facial droop, or clonus.     On 1510 exam patient would respond to her husband saying her name but not to me saying it.  She denied pain, saying she feels sleepy and asking when she can go home before falling back asleep.  She called her husband another female relative's name.  Does not remember why she was anxious earlier.     Lorin Glass, PA-C 01/03/19 1403    Hayden Rasmussen, MD 01/03/19 Johnnye Lana

## 2019-01-02 NOTE — ED Notes (Signed)
Patient transported to CT 

## 2019-01-02 NOTE — ED Provider Notes (Signed)
Medical screening examination/treatment/procedure(s) were conducted as a shared visit with non-physician practitioner(s) and myself.  I personally evaluated the patient during the encounter. Briefly, the patient is a 38 y.o. female with history of ADD, kidney stones, exercise-induced asthma who presented to the ED initially for panic attack.  Patient was initially seen by ED provider about 11 to 12 hours prior to my evaluation.  Patient was signed out to my physician assistant as she was not medically cleared for discharge due to concern for medication side effect that she was given Haldol by EMS.  Nursing staff came to get me around 6:30 PM to evaluate the patient as she was still having difficulty with walking.  They originally asked for possible medicine to help with extrapyrimidal effects of Haldol.  At that time I went to go evaluate the patient.  Her husband was at the bedside.  Upon examination patient is somnolent but easily arousable.  She is able to talk to me but she does lose focus when talking.  Most of the history is provided by the husband who states that the patient woke up around 7 AM apparently panicked and anxious.  She was shouting for his name.  He states that she tried to get up out of bed but appeared to be too anxious to walk.  Patient remained in her bed until EMS arrived who found her supposedly contiuing to exhibit panic-like symptoms.  Husband states that she just kept asking for him by his name.  He states that she has panic attacks in the past and appeared to have a similar event this morning.  At that time he states that EMS had given her medication which apparently was Haldol and Zofran.  Patient does take Adderall and Zoloft.  Husband states that patient has been sleepy all day in the ED but she appears to be improving.  When I examined the patient she is able to tell me her name, she moves all of her extremities, she follows commands.  However, intermittently patient has word salad  in which she does not make sense and does not answer questions appropriately.  When she is redirected she is able to mostly correct for this.  However when she stands up she is severely ataxic and states that she feels dizzy.  Patient appears to have decreased vision bilaterally when testing visual acuity, there appears to be a possible horizontal nystagmus as well.  According to the husband she has had right-sided neck pain for about a week or more. Denies trauma, any neck manipulation.  Overall symptoms are concerning for possibly central process including stroke, meningitis, however possible medication side effect is also likely as well as possible psychiatric issues as well. Less likely serotonin syndrome (no clonus, fever, tachycardia). Will give patient IV Benadryl in case extrapyramidal effects from Haldol.  Given her symptoms and no return to baseline we will obtain a CTA of her head and neck, expand the work-up to also include urinalysis, urine drug screen, ammonia, ethanol level.  Will obtain a lactic acid as well.  Patient already with a CBC and a CMP. EKG shows sinus rythmn. Has mild leukocytosis.  Does not appear to have any meningitis signs on exam but will perform LP, if CT imaging is negative to complete AMS workup.  Patient positive for amphetamines but likely from her Adderall.  No signs of urinary tract infection.  Ethanol level is negative.  Lactic acid normal.  CT scan revealed concerning findings.  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  There is also trace petechial hemorrhage in occipital lobes but no frank hemorrhagic transformation.  There is also  mass-effect within the right posterior fossa with partial effacement of the fourth ventricle but no obstructive hydrocephalus at this time.  Upon re-evaluation patient has normal GCS.  She opens her eyes spontaneously, follows commands, has normal speech though at times word salad.    Dr. Malen Gauze with neurology was consulted and recommends admission to the neurological ICU.  He recommends starting hypertonic saline and he will consult the neurosurgeon for possible intervention.  He requested that I call the pulmonary ICU to be the primary team but after discussion between the 2 teams Dr. Malen Gauze will be the primary admitting team.  Patient remained hemodynamically stable throughout my care and was transferred to the neurological ICU at [facility] for further treatment.  Neurological exam is stable.  Protecting her airway throughout my care. Neurology anticipates neurolosurigical intervention.   This chart was dictated using voice recognition software.  Despite best efforts to proofread,  errors can occur which can change the documentation meaning.   .Critical Care Performed by: Lennice Sites, DO Authorized by: Lennice Sites, DO   Critical care provider statement:    Critical care time (minutes):  90   Critical care time was exclusive of:  Separately billable procedures and treating other patients and teaching time   Critical care was necessary to treat or prevent imminent or life-threatening deterioration of the following conditions:  CNS failure or compromise   Critical care was time spent personally by me on the following activities:  Blood draw for specimens, development of treatment plan with patient or surrogate, discussions with consultants, discussions with primary provider, evaluation of patient's response to treatment, examination of patient, obtaining history from patient or surrogate, ordering and performing treatments and interventions, ordering and review of laboratory studies, ordering  and review of radiographic studies, re-evaluation of patient's condition, review of old charts and pulse oximetry   I assumed direction of critical care for this patient from another provider in my specialty: no       EKG Interpretation  Date/Time:  Tuesday January 02 2019 21:58:28 EST Ventricular Rate:  87 PR Interval:    QRS Duration: 93 QT Interval:  370 QTC Calculation: 446 R Axis:   75 Text Interpretation:  Sinus arrhythmia Probable left atrial enlargement Borderline repolarization abnormality Confirmed by Lennice Sites [phone]) on 01/02/2019 11:39:00 PM           Lennice Sites, DO 01/02/19 2357

## 2019-01-02 NOTE — ED Notes (Signed)
Bed: [facility] Expected date:  Expected time:  Means of arrival:  Comments: 

## 2019-01-02 NOTE — ED Triage Notes (Addendum)
On assessment pt forwards little with this nurse, c/o panic attacks over past 2 weeks, currently closing eyes, reports she is tired. Marland Kitchen

## 2019-01-02 NOTE — ED Notes (Addendum)
Per EMS pt husband called EMS due to panic.  Per husband pt experiencing panic attacks over past 2 years. Pt currently taking Zoloft prescribed by PCP. Haldol 5MG  and, Zofran 4mg   given by EMS PTA.

## 2019-01-02 NOTE — ED Notes (Signed)
Pt assisted to ambulate with 2 person assist appx 20 feet.  Pt walking with unsteady gate, needing verbal cues.  Pt keep stating "I fell funny, is this supposed to feel like this." PA made aware.

## 2019-01-02 NOTE — ED Notes (Signed)
Family at bedside. 

## 2019-01-02 NOTE — ED Notes (Signed)
ED Provider at bedside. 

## 2019-01-02 NOTE — ED Provider Notes (Signed)
4:07 PM Patient taken in sign out from [facility] Patient with panic attack EMS gave haldol Patient still quite sedate and intoxicated from meds Husband at bedside and expect OP discharge.  4:30PM Patient ambulated with assistance, Nurse reports that patient still "wobbly" and unable to ambulate safely.   6:20 Patient assissted to Bathroom, still having diffvculty ambulating. I was called by Hanley Seamen, Charge RN for increasing concern for potential dystonic reaction to the haldo given her length of stay. As I was tied up with agitated patient bib [employer] in [facility], I asked Stacy to have Dr. Ronnald Nian see the patient and assume care.        Margarita Mail, PA-C 01/02/19 2234    Lennice Sites, DO 01/02/19 2338

## 2019-01-02 NOTE — H&P (Signed)
Stroke admission H&P  CC: Dizziness, lethargy  History is obtained from: Chart, patient, patient's husband  HPI: [name] is a 38 y.o. female past medical history of ADD, panic attacks, presented to the emergency room and was seen on hospital for evaluation of an episode of panic attack.  She was seen in the emergency room this morning and resting on hospital after EMS was called because of her having feelings of anxiety.  EMS gave her 5 mg of Haldol in addition to 4 mg of Zofran and she also complained of nausea.  She was unable to provide much history. According to the husband, she had been complaining of days to weeks of right-sided neck pain.  No other focal deficits were noted by the family.  This morning she woke up with a bad headache and neck pain.  She also was feeling very anxious.  But reports when attempts were made to walk her, she was very wobbly and ataxic. Sometime around 830 or 9 PM a CT of the head was done because she continued to be lethargic and complained of dizziness.  The noncontrast CT head showed a evolving acute ischemic right cerebellar and bilateral PCA territory infarcts with involvement of bilateral occipital lobes and thalami along with trace petechial hemorrhage at the occipital poles without frank hemorrhagic transformation.  There was associated mass-effect within the right posterior fossa with partial effacement of the right basilar cisterns and fourth ventricle but no obstructive hydrocephalus at the time.  CTA head and neck showed acute right vertebral artery dissection involving the right V3 segment at the level of the right C1 transverse foramen with probable small dissection flap just distally at the right V3 V4 junction with associated moderate stenosis.  There is also probable distal bilateral P3 P4 occlusions possibly thromboembolic in nature.  No stenosis or occlusions in the anterior circulation. Neurological consultation was obtained for possible  admission and further management of the strokes. The patient has not had any neck manipulation or accidents.  No prior history of strokes.  She has a history of frequent headaches and migraines which have been worse over the past 18 months since her mother died.  She has had multiple panic attacks ever since that time but no focal neurological deficits as noted by family.   LKW: Sometime last night to 09/11/2019. tpa given?: no, established stroke on CT, outside the window Premorbid modified Rankin scale (mRS):0  ROS: Patient unable to reliably provide  Past Medical History:  Diagnosis Date  . ADD (attention deficit disorder)   . Exercise-induced asthma   . Hx of varicella   . Kidney stones   . S/P cesarean section 05/05/2016  . S/P tubal ligation 05/05/2016  . Term pregnancy, repeat 05/04/2016    Family History  Problem Relation Age of Onset  . Cancer Mother   . Cancer Maternal Aunt   . Cancer Maternal Grandmother    Social History:   reports that she has never smoked. She has never used smokeless tobacco. She reports that she does not drink alcohol or use drugs.  Medications  Current Facility-Administered Medications:  .  [START ON 01/03/2019]  stroke: mapping our early stages of recovery book, , Does not apply, Once, Amie Portland, MD .  acetaminophen (TYLENOL) tablet 650 mg, 650 mg, Oral, Q4H PRN **OR** acetaminophen (TYLENOL) solution 650 mg, 650 mg, Per Tube, Q4H PRN **OR** acetaminophen (TYLENOL) suppository 650 mg, 650 mg, Rectal, Q4H PRN, Amie Portland, MD .  iopamidol (ISOVUE-370)  76 % injection, , , ,  .  lidocaine (PF) (XYLOCAINE) 1 % injection 5 mL, 5 mL, Infiltration, Once, Curatolo, Adam, DO .  senna-docusate (Senokot-S) tablet 1 tablet, 1 tablet, Oral, QHS PRN, Amie Portland, MD .  sodium chloride (hypertonic) 3 % solution, , Intravenous, Continuous, Curatolo, Adam, DO, Stopped at 01/02/19 2237 .  sodium chloride (PF) 0.9 % injection, , , ,   Current Outpatient  Medications:  .  [START ON 01/10/2019] ADDERALL XR 20 MG 24 hr capsule, TAKE 1 CAPSULE BY MOUTH 2 TIMES DAILY. EFFECTIVE 08/14/18 (Patient taking differently: Take 20 mg by mouth 2 (two) times daily. ), Disp: 60 capsule, Rfl: 0 .  amphetamine-dextroamphetamine (ADDERALL) 10 MG tablet, TAKE 1 TABLET (10 MG TOTAL) BY MOUTH DAILY., Disp: 30 tablet, Rfl: 0 .  ibuprofen (ADVIL,MOTRIN) 200 MG tablet, Take 800 mg by mouth every 6 (six) hours as needed for moderate pain., Disp: , Rfl:  .  sertraline (ZOLOFT) 100 MG tablet, Take 100 mg by mouth at bedtime. , Disp: , Rfl:  .  ALPRAZolam (XANAX) 0.25 MG tablet, Take 1 tablet (0.25 mg total) by mouth 2 (two) times daily as needed for anxiety. (Patient not taking: Reported on 11/02/2018), Disp: 20 tablet, Rfl: 0 .  amoxicillin (AMOXIL) 875 MG tablet, Take 1 tablet (875 mg total) by mouth 2 (two) times daily. (Patient not taking: Reported on 01/02/2019), Disp: 20 tablet, Rfl: 0 .  amphetamine-dextroamphetamine (ADDERALL XR) 20 MG 24 hr capsule, Take 1 capsule (20 mg total) by mouth 2 (two) times daily. (Patient not taking: Reported on 01/02/2019), Disp: 60 capsule, Rfl: 0 .  amphetamine-dextroamphetamine (ADDERALL XR) 20 MG 24 hr capsule, Take 1 capsule (20 mg total) by mouth 2 (two) times daily. (Patient not taking: Reported on 01/02/2019), Disp: 60 capsule, Rfl: 0 .  hydrOXYzine (ATARAX/VISTARIL) 50 MG tablet, Take 1 tablet (50 mg total) by mouth every 6 (six) hours as needed for anxiety., Disp: 10 tablet, Rfl: 0  Exam: Current vital signs: BP (!) 142/84   Pulse (!) 56   Temp 98.5 F (36.9 C) (Oral)   Resp 14   Ht 5\' 4"  (1.626 m)   Wt 86.2 kg   LMP 12/17/2018 (Approximate)   SpO2 96%   BMI 32.61 kg/m  Vital signs in last 24 hours: Temp:  [98.1 F (36.7 C)-98.5 F (36.9 C)] 98.5 F (36.9 C) (02/11 1921) Pulse Rate:  [55-103] 56 (02/11 2330) Resp:  [12-23] 14 (02/11 2330) BP: (128-169)/(77-128) 142/84 (02/11 2330) SpO2:  [95 %-100 %] 96 % (02/11  2330) Weight:  [86.2 kg] 86.2 kg (02/11 0807) General: Patient is very drowsy, opens eyes to voice but keeps falling asleep during this encounter. HEENT: Normocephalic atraumatic dry mucous membranes Lungs: Clear to auscultation CVS: S1-2 heard regular rhythm Abdomen: Soft nondistended nontender Extremities: Warm well perfused intact pulses Neurological exam She is very drowsy, opens eyes to voice but keeps falling asleep during the encounter.  At times she is able to open her eyes for about 10 to 15 seconds but on other occasions she shuts her eyes within 5 seconds. She is able to name objects reliably.  Her speech is non-dysarthric.  Comprehension is intact. Repetition is intact.  During the entire course of the interview at some point she did start perseverating and did repeat answers to the prior questions and not to the question being asked.  This has off-and-on been happening per her husband. Cranial nerves: Pupils are equal round reactive to light, extraocular  movements are intact, visual fields are full to threat, face is symmetric, auditory acuity intact, palate midline, tongue midline. Motor exam: No vertical drift in any of the extremities although the right upper extremity appeared to be drifting up on exam.  Individual muscle testing did not reveal any weakness in the upper lower extremity. Sensory exam: Intact to light touch all over Coordination: Difficult to keep her awake and engaged to perform finger-nose-finger or heel-knee-shin. Gait testing was deferred at this time but reportedly when she attempted to go to the bathroom and attempted to walk, she was very ataxic.  NIHSS 1a Level of Conscious.: 1 1b LOC Questions: 0 1c LOC Commands: 0 2 Best Gaze: 0 3 Visual: 0 4 Facial Palsy: 0 5a Motor Arm - left: 0 5b Motor Arm - Right: 0 6a Motor Leg - Left: 0 6b Motor Leg - Right: 0 7 Limb Ataxia: 0 8 Sensory: 0 9 Best Language1 10 Dysarthria: 0 11 Extinct. and Inatten.:  0 TOTAL: 2    Labs I have reviewed labs in epic and the results pertinent to this consultation are:  CBC    Component Value Date/Time   WBC 13.2 (H) 01/02/2019 0903   RBC 4.69 01/02/2019 0903   HGB 13.9 01/02/2019 0903   HCT 42.4 01/02/2019 0903   PLT 295 01/02/2019 0903   MCV 90.4 01/02/2019 0903   MCH 29.6 01/02/2019 0903   MCHC 32.8 01/02/2019 0903   RDW 13.4 01/02/2019 0903   LYMPHSABS 1.6 01/02/2019 0903   MONOABS 0.8 01/02/2019 0903   EOSABS 0.1 01/02/2019 0903   BASOSABS 0.1 01/02/2019 0903   CMP     Component Value Date/Time   NA 137 01/02/2019 2222   K 3.3 (L) 01/02/2019 0903   CL 108 01/02/2019 0903   CO2 20 (L) 01/02/2019 0903   GLUCOSE 179 (H) 01/02/2019 0903   BUN 11 01/02/2019 0903   CREATININE 0.63 01/02/2019 0903   CREATININE 0.68 11/26/2011 1519   CALCIUM 8.2 (L) 01/02/2019 0903   PROT 7.5 01/02/2019 0903   ALBUMIN 4.3 01/02/2019 0903   AST 29 01/02/2019 0903   ALT 23 01/02/2019 0903   ALKPHOS 73 01/02/2019 0903   BILITOT 0.1 (L) 01/02/2019 0903   GFRNONAA >60 01/02/2019 0903   GFRAA >60 01/02/2019 0903   Imaging I have reviewed the images obtained: The noncontrast CT head showed a evolving acute ischemic right cerebellar and bilateral PCA territory infarcts with involvement of bilateral occipital lobes and thalami along with trace petechial hemorrhage at the occipital poles without frank hemorrhagic transformation.  There was associated mass-effect within the right posterior fossa with partial effacement of the right basilar cisterns and fourth ventricle but no obstructive hydrocephalus at the time.  CTA head and neck showed acute right vertebral artery dissection involving the right V3 segment at the level of the right C1 transverse foramen with probable small dissection flap just distally at the right V3 V4 junction with associated moderate stenosis.  There is also probable distal bilateral P3 P4 occlusions possibly thromboembolic in nature.  No  stenosis or occlusions in the anterior circulation.  Assessment: 38 year old woman with history of ADD, panic attacks presenting for evaluation of a panic attack and was found to be extremely lethargic.  Initially it was deemed that she is lethargic because of receiving Haldol but her mentation did not improve and a noncontrast head CT and CT angiogram shows right cerebellar and bilateral PCA territory infarcts with right vertebral artery dissection. Etiology of  the dissection is unclear. At this time, the CT of the head also shows effacement of the right basilar cisterns and fourth ventricle with no obstructive hydrocephalus. I have recommended admission to the neurological ICU at [facility] this time.  Further recommendations below  Not a candidate for IV TPA due to being outside the window and establish hypodensity in the CT.   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  Might need to go to the OR for decompressive suboccipital Craniectomy -Neurosurgical evaluation requested.  Spoke with Dr. Trenton Gammon who will assess the patient. -Allow permissive hypertension-goal of SYS less than 220.  Treat as needed if over 220. -MRI/ECHO/A1C/Lipid panel. -Hyperglycemia management per SSI to maintain glucose 140-180mg /dL. -PT/OT/ST therapies and recommendations when able -Consider work-up for connective tissue disorders as an etiology for dissection. -Urine toxicology screen positive for amphetamines.  Could be the cause of dissection.  CNS Cerebral edema Compression of brain -Hyperosmolar therapy -3% saline at 75 cc an hour via peripheral line.  Might require central line.   Will request ICU consultation if needed. -NSGY consult  -Close neuro monitoring -Might need intubation.  -NPO -might need surgery. -ST  Hemiplegia and hemiparesis following cerebral infarction affecting right dominant side -PT/OT -PM&R consult  RESP -Is able to protect airway for now. -Close clinical watch. -Might need intubation.  Will request CCM consult at the time.  CVS Blood pressure goal as above. Transthoracic echo  Hyperlipidemia, unspecified  - Statin for goal LDL < 70  HEME No active issues -Monitor -transfuse for hgb < 7  ENDO No active issues -goal HgbA1c < 7  GI/GU Hyperosmolar therapy with 3% saline for cerebral edema Check sodiums per protocol  Fluid/Electrolyte Disorders Hypokalemia Replete Check labs  ID Possible Aspiration PNA -CXR -NPO -Monitor  Prophylaxis DVT: SCDs only.  Hold any antiplatelets or anticoagulants as she might need surgery. GI: Not applicable Bowel:  Docusate/senna  Diet: NPO for now  Code Status: Full Code   THE FOLLOWING WERE PRESENT ON ADMISSION: Acute ischemic stroke Cerebral edema Hemiparesis Possible aspiration pneumonia Vertebral artery dissection    CRITICAL CARE ATTESTATION Performed by: Amie Portland, MD Total critical care time: 60 minutes Critical care time was exclusive of separately billable procedures and treating other patients and/or supervising APPs/Residents/Students Critical care was necessary to treat or prevent imminent or life-threatening deterioration due to acute ischemic stroke.  Right vertebral artery dissection.  Cerebral edema. This patient is critically ill and at significant risk for neurological worsening and/or death and care requires constant monitoring. Critical care was time spent personally by me on the following activities: development of treatment plan with patient and/or surrogate as well as nursing, discussions with consultants, evaluation of patient's response to  treatment, examination of patient, obtaining history from patient or surrogate, ordering and performing treatments and interventions, ordering and review of laboratory studies, ordering and review of radiographic studies, pulse oximetry, re-evaluation of patient's condition, participation in multidisciplinary rounds and medical decision making of high complexity in the care of this patient.  Stroke team primary.  -- Amie Portland, MD [facility] Pager: [phone] If 7pm to 7am, please call on call as listed on [facility].

## 2019-01-02 NOTE — ED Notes (Signed)
Pt ambulated to bathroom with 1 person assist.  Pt with unsteady gait, tearful.

## 2019-01-02 NOTE — Discharge Instructions (Addendum)
Today you received medications that may make you sleepy or impair your ability to make decisions.  For the next 24 hours please do not drive, operate heavy machinery, care for a small child with out another adult present, or perform any activities that may cause harm to you or someone else if you were to fall asleep or be impaired.   Follow the above listed precautions for 12 hours after you take a dose of atarax.

## 2019-01-03 ENCOUNTER — Encounter (HOSPITAL_COMMUNITY): Payer: Self-pay

## 2019-01-03 ENCOUNTER — Inpatient Hospital Stay (HOSPITAL_COMMUNITY): Payer: 59

## 2019-01-03 ENCOUNTER — Other Ambulatory Visit (HOSPITAL_COMMUNITY): Payer: 59

## 2019-01-03 DIAGNOSIS — I7774 Dissection of vertebral artery: Secondary | ICD-10-CM

## 2019-01-03 DIAGNOSIS — G936 Cerebral edema: Secondary | ICD-10-CM | POA: Diagnosis not present

## 2019-01-03 DIAGNOSIS — I639 Cerebral infarction, unspecified: Secondary | ICD-10-CM

## 2019-01-03 DIAGNOSIS — I69393 Ataxia following cerebral infarction: Secondary | ICD-10-CM

## 2019-01-03 DIAGNOSIS — I63533 Cerebral infarction due to unspecified occlusion or stenosis of bilateral posterior cerebral arteries: Secondary | ICD-10-CM | POA: Diagnosis not present

## 2019-01-03 LAB — LIPID PANEL
Cholesterol: 160 mg/dL (ref 0–200)
HDL: 57 mg/dL (ref >40)
LDL Cholesterol: 88 mg/dL (ref 0–99)
Total CHOL/HDL Ratio: 2.8 RATIO
Triglycerides: 74 mg/dL (ref <150)
VLDL: 15 mg/dL (ref 0–40)

## 2019-01-03 LAB — T4, FREE: Free T4: 0.92 ng/dL (ref 0.82–1.77)

## 2019-01-03 LAB — HIV ANTIBODY (ROUTINE TESTING W REFLEX): HIV Screen 4th Generation wRfx: NONREACTIVE

## 2019-01-03 LAB — ECHOCARDIOGRAM COMPLETE
Height: 64 in
Weight: 3040 oz

## 2019-01-03 LAB — SODIUM
Sodium: 140 mmol/L (ref 135–145)
Sodium: 141 mmol/L (ref 135–145)
Sodium: 141 mmol/L (ref 135–145)

## 2019-01-03 LAB — HEMOGLOBIN A1C
Hgb A1c MFr Bld: 5.7 % — ABNORMAL HIGH (ref 4.8–5.6)
Mean Plasma Glucose: 116.89 mg/dL

## 2019-01-03 LAB — CBG MONITORING, ED: Glucose-Capillary: 107 mg/dL — ABNORMAL HIGH (ref 70–99)

## 2019-01-03 LAB — SURGICAL PCR SCREEN
MRSA, PCR: NEGATIVE
Staphylococcus aureus: NEGATIVE

## 2019-01-03 MED ORDER — SODIUM CHLORIDE 3 % IV SOLN
INTRAVENOUS | Status: AC
  Administered 2019-01-04: 75 mL/h via INTRAVENOUS
  Filled 2019-01-03 (×5): qty 500

## 2019-01-03 MED ORDER — ASPIRIN EC 325 MG PO TBEC
325.0000 mg | DELAYED_RELEASE_TABLET | Freq: Every day | ORAL | Status: DC
  Administered 2019-01-03 – 2019-01-06 (×4): 325 mg via ORAL
  Filled 2019-01-03 (×4): qty 1

## 2019-01-03 MED ORDER — SODIUM CHLORIDE 0.9 % IV SOLN
INTRAVENOUS | Status: DC | PRN
  Administered 2019-01-03: 250 mL via INTRAVENOUS

## 2019-01-03 MED ORDER — POTASSIUM CHLORIDE 10 MEQ/100ML IV SOLN
10.0000 meq | INTRAVENOUS | Status: AC
  Administered 2019-01-03 (×3): 10 meq via INTRAVENOUS
  Filled 2019-01-03 (×3): qty 100

## 2019-01-03 NOTE — Evaluation (Signed)
Occupational Therapy Evaluation Patient Details Name: [name] MRN: [id number] DOB: 1981-02-12 Today's Date: 01/03/2019    History of Present Illness Pt is a 38 y/o female with PMH of ADD, panic attacks, presented to ER for evaluation of episode of panic attack with reports of increased R sided neck pain and headache.  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 Continued workup.    Clinical Impression   PTA patient independent, working and taking care of her 4 children.  She lives with her spouse and children, has a very supportive family-  Father reports she will have assistance at discharge as needed.  Patient admitted for above and limited by problem list below, including balance, safety, diplopia, coordination, and cognition.  Mild expressive difficulties during session, disoriented to time and situation, and difficulty problem solving during session.  Patient requires +2 mod assist for in room functional mobility (hand held assist), +2 min assist for basic transfers, mod assist for UB ADLs and mod assist for LB ADLs.  Based on performance today, patient will benefit from continued OT services while admitted and after dc at CIR level in order to optimize independence and return to PLOF.  Will follow.  I have discussed the patient's current level of function related to ADLs/mobility with the patient and father.  They acknowledge understanding of this and do not feel the patient would be able to have their care needs met at home.  They are interested in post-acute rehab in an inpatient setting.      Follow Up Recommendations  CIR;Supervision/Assistance - 24 hour    Equipment Recommendations  Other (comment)(TBD at next venue of care)    Recommendations for Other Services Rehab consult     Precautions / Restrictions Precautions Precautions: Fall Restrictions Weight Bearing Restrictions: No       Mobility Bed Mobility Overal bed mobility: Needs Assistance Bed Mobility: Supine to Sit     Supine to sit: Min assist;+2 for safety/equipment;HOB elevated     General bed mobility comments: min assist to ascend trunk, transfer towards L side of bed with cueing for safety and sequencing   Transfers Overall transfer level: Needs assistance Equipment used: 2 person hand held assist Transfers: Sit to/from Stand Sit to Stand: Min assist;+2 safety/equipment;+2 physical assistance         General transfer comment: min assist to power up and steady with +2, B UE hand support  required    Balance Overall balance assessment: Needs assistance Sitting-balance support: No upper extremity supported;Feet supported Sitting balance-Leahy Scale: Fair Sitting balance - Comments: min guard for safety dynamically    Standing balance support: Bilateral upper extremity supported;During functional activity Standing balance-Leahy Scale: Poor Standing balance comment: reliant on UE and external support                           ADL either performed or assessed with clinical judgement   ADL Overall ADL's : Needs assistance/impaired     Grooming: Wash/dry hands;Minimal assistance;Standing(+2 safety standing )   Upper Body Bathing: Minimal assistance;Sitting   Lower Body Bathing: Moderate assistance;Sit to/from stand;+2 for safety/equipment;+2 for physical assistance   Upper Body Dressing : Moderate assistance;Sitting   Lower Body Dressing: Moderate assistance;+2 for physical assistance;+2 for safety/equipment;Sit to/from stand   Toilet Transfer: Minimal assistance;Moderate assistance;+2 for physical assistance;+2 for safety/equipment;Ambulation;Grab Information systems manager Details (indicate cue type and reason): patient  requires +2 for safety, mod assist during ambulation but min assist for transfers  Toileting- Clothing Manipulation and Hygiene: Moderate assistance;+2  for physical assistance;+2 for safety/equipment;Sit to/from stand Toileting - Clothing Manipulation Details (indicate cue type and reason): assist with clothing mgmt down and steadying assist for 0 hand support to manage clohting up      Functional mobility during ADLs: Moderate assistance;+2 for physical assistance;+2 for safety/equipment;Cueing for safety;Cueing for sequencing       Vision Baseline Vision/History: Wears glasses Wears Glasses: Reading only Patient Visual Report: Diplopia Vision Assessment?: Yes Eye Alignment: Within Functional Limits Ocular Range of Motion: Within Functional Limits Alignment/Gaze Preference: Within Defined Limits Tracking/Visual Pursuits: Able to track stimulus in all quads without difficulty Saccades: Within functional limits Convergence: Within functional limits Visual Fields: No apparent deficits Diplopia Assessment: Disappears with one eye closed;Present all the time/all directions;Objects split side to side Depth Perception: Overshoots Additional Comments: pt preference to close L eye during functional tasks      Perception     Praxis      Pertinent Vitals/Pain Pain Assessment: No/denies pain     Hand Dominance Right   Extremity/Trunk Assessment Upper Extremity Assessment Upper Extremity Assessment: RUE deficits/detail;LUE deficits/detail RUE Deficits / Details: grossly 4+/5 MMT, dysmetric  RUE Coordination: decreased fine motor;decreased gross motor LUE Deficits / Details: grossly 4+/5, appears slightly dysmetric but better than R UE  LUE Coordination: decreased gross motor   Lower Extremity Assessment Lower Extremity Assessment: Defer to PT evaluation       Communication Communication Communication: Expressive difficulties   Cognition Arousal/Alertness: Awake/alert(initally lethargic but improves during session) Behavior During Therapy: Flat affect Overall Cognitive Status: Impaired/Different from baseline Area of  Impairment: Orientation;Attention;Memory;Following commands;Safety/judgement;Awareness;Problem solving                 Orientation Level: Time;Situation(reports 2000) Current Attention Level: Sustained Memory: Decreased recall of precautions;Decreased short-term memory Following Commands: Follows one step commands consistently;Follows one step commands with increased time Safety/Judgement: Decreased awareness of safety;Decreased awareness of deficits Awareness: Emergent Problem Solving: Slow processing;Decreased initiation;Difficulty sequencing;Requires verbal cues;Requires tactile cues General Comments: patient requires increased time to process and follow commands, cueing for problem sovling and awareness; some expressive difficulties, appears with limited recall of situation, tends to repeat "situtation" of what she heard last (initally reporting back pain, then BP, then headache)     General Comments  father present and supportive; BP: 144/84 supine, 154/93 after activity seated in chair     Exercises     Shoulder Instructions      Home Living Family/patient expects to be discharged to:: Private residence Living Arrangements: Spouse/significant other;Children Available Help at Discharge: Family;Available 24 hours/day(her father reports there will be assist avail, spouse works ) Type of Home: House Home Access: Stairs to enter Technical brewer of Steps: [place]: Two level     Bathroom Shower/Tub: Occupational psychologist: [place]: None          Prior Functioning/Environment Level of Independence: Independent        Comments: 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, drives, has 4 children (ages 82-15)        OT Problem List: Decreased activity tolerance;Impaired balance (sitting and/or standing);Impaired vision/perception;Decreased coordination;Decreased cognition;Decreased safety awareness;Decreased knowledge of use of DME or  AE;Decreased knowledge of precautions      OT Treatment/Interventions: Self-care/ADL training;Neuromuscular education;DME and/or AE instruction;Therapeutic activities;Cognitive remediation/compensation;Visual/perceptual remediation/compensation;Patient/family education;Balance training    OT Goals(Current goals  can be found in the care plan section) Acute Rehab OT Goals Patient Stated Goal: to get better OT Goal Formulation: With patient Time For Goal Achievement: 01/17/19 Potential to Achieve Goals: Good  OT Frequency: Min 3X/week   Barriers to D/C:            Co-evaluation PT/OT/SLP Co-Evaluation/Treatment: Yes Reason for Co-Treatment: For patient/therapist safety;To address functional/ADL transfers   OT goals addressed during session: ADL's and self-care      AM-PAC OT "6 Clicks" Daily Activity     Outcome Measure Help from another person eating meals?: Total(NPO) Help from another person taking care of personal grooming?: A Lot Help from another person toileting, which includes using toliet, bedpan, or urinal?: A Lot Help from another person bathing (including washing, rinsing, drying)?: A Lot Help from another person to put on and taking off regular upper body clothing?: A Lot Help from another person to put on and taking off regular lower body clothing?: A Lot 6 Click Score: 11   End of Session Equipment Utilized During Treatment: Gait belt;Rolling walker Nurse Communication: Mobility status;Precautions  Activity Tolerance: Patient tolerated treatment well Patient left: in chair;with call bell/phone within reach;with chair alarm set;with family/visitor present;with nursing/sitter in room  OT Visit Diagnosis: Other abnormalities of gait and mobility (R26.89);Other symptoms and signs involving the nervous system (R29.898);Other symptoms and signs involving cognitive function;Cognitive communication deficit (R41.841);Dizziness and giddiness (R42);Ataxia, unspecified (R27.0)                 Time: 0822-0902 OT Time Calculation (min): 40 min Charges:  OT General Charges $OT Visit: 1 Visit OT Evaluation $OT Eval Moderate Complexity: 1 Mod OT Treatments $Self Care/Home Management : 8-22 mins  Delight Stare, OT Acute Rehabilitation Services Pager [phone] Office [phone]   Delight Stare 01/03/2019, 10:15 AM

## 2019-01-03 NOTE — ED Notes (Signed)
[facility] arrived for transport to [facility]

## 2019-01-03 NOTE — Progress Notes (Signed)
MRI scan reviewed.  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  She remains easily arousable and follows commands briskly.  She has no evidence of hydrocephalus.  She has some mild effacement of her fourth ventricle but has no evidence of obstructive hydrocephalus.  I recommend continued hypertonic saline treatment and continued close observation.

## 2019-01-03 NOTE — Progress Notes (Signed)
Rehab Admissions Coordinator Note:  Patient was screened by Retta Diones for appropriateness for an Inpatient Acute Rehab Consult.  At this time, we are recommending Inpatient Rehab consult.  Jodell Cipro M 01/03/2019, 11:05 AM  I can be reached at [phone].

## 2019-01-03 NOTE — Evaluation (Signed)
Physical Therapy Evaluation Patient Details Name: [name] MRN: [id number] DOB: 09-Aug-1981 Today's Date: 01/03/2019   History of Present Illness  Pt is a 38 y/o female with PMH of ADD, panic attacks, presented to ER for evaluation of episode of panic attack with reports of increased R sided neck pain and headache.  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 Continued workup.   Clinical Impression  Pt admitted with above. Pt presenting with impaired R UE/LE co-ordination, motor planning, and sequencing requiring modAx2 for OOB mobility despite testing 5/5 on R LE MMT. Pt also with impaired recall and memory and inability to recall why she is in the hospital despite being told why. Pt with word finding difficulties as well. Pt indep and working PTA. Pt to benefit from CIR upon d/c for maximal functional recovery. Acute PT to cont to follow.    Follow Up Recommendations CIR    Equipment Recommendations  Other (comment)(TBD)    Recommendations for Other Services Rehab consult     Precautions / Restrictions Precautions Precautions: Fall Restrictions Weight Bearing Restrictions: No      Mobility  Bed Mobility Overal bed mobility: Needs Assistance Bed Mobility: Supine to Sit     Supine to sit: Min assist;+2 for safety/equipment;HOB elevated     General bed mobility comments: pt with delayed use of R UE, increased time, HOB elevated, tactile cues to complete task  Transfers Overall transfer level: Needs assistance Equipment used: 2 person hand held assist Transfers: Sit to/from Stand Sit to Stand: Min assist;+2 safety/equipment;+2 physical assistance         General transfer comment: min assist to power up and steady with +2, B UE hand support  required  Ambulation/Gait Ambulation/Gait assistance: Mod assist;+2 physical assistance;+2 safety/equipment Gait Distance (Feet): 10 Feet(x2  (to/from bathroom)) Assistive device: 2 person hand held assist Gait Pattern/deviations: Step-through pattern;Ataxic;Narrow base of support Gait velocity: decreased Gait velocity interpretation: <1.8 ft/sec, indicate of risk for recurrent falls General Gait Details: pt with significantly impaired R LE co-ordination, motor planning and sequencing. Pt with ataxic, occasionaly scissoring stepping pattern, pt reports dizziness most likely due to cerebellar involvement  Stairs            Wheelchair Mobility    Modified Rankin (Stroke Patients Only) Modified Rankin (Stroke Patients Only) Pre-Morbid Rankin Score: No symptoms Modified Rankin: Moderately severe disability     Balance Overall balance assessment: Needs assistance Sitting-balance support: No upper extremity supported;Feet supported Sitting balance-Leahy Scale: Fair Sitting balance - Comments: min guard for safety dynamically    Standing balance support: Bilateral upper extremity supported;During functional activity(washing hands at sink) Standing balance-Leahy Scale: Poor Standing balance comment: reliant on UE and external support                             Pertinent Vitals/Pain Pain Assessment: No/denies pain(but did report dizziness)    Home Living Family/patient expects to be discharged to:: Private residence Living Arrangements: Spouse/significant other;Children Available Help at Discharge: Family;Available 24 hours/day(her father reports there will be assist avail, spouse works ) Type of Home: House Home Access: Stairs to enter   Technical brewer of Steps: 5 Home Layout: Two level [place]: None      Prior Function Level of Independence: Independent         Comments: 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, drives, has 4 children (ages  2-15)     Hand Dominance   Dominant Hand: Right    Extremity/Trunk Assessment   Upper Extremity Assessment Upper Extremity Assessment: Defer  to OT evaluation RUE Deficits / Details: grossly 4+/5 MMT, dysmetric  RUE Coordination: decreased fine motor;decreased gross motor LUE Deficits / Details: grossly 4+/5, appears slightly dysmetric but better than R UE  LUE Coordination: decreased gross motor    Lower Extremity Assessment Lower Extremity Assessment: RLE deficits/detail RLE Deficits / Details: MMT testing at 5/5 grossly  however co-oridantion an motor planning/sequencing significantly impaired RLE Coordination: decreased gross motor(ataxic)    Cervical / Trunk Assessment Cervical / Trunk Assessment: Normal  Communication   Communication: Expressive difficulties(word finding difficulties, perseveration on last word heard)  Cognition Arousal/Alertness: Awake/alert(initally lethargic but improves during session) Behavior During Therapy: Flat affect Overall Cognitive Status: Impaired/Different from baseline Area of Impairment: Orientation;Attention;Memory;Following commands;Safety/judgement;Awareness;Problem solving                 Orientation Level: Time;Situation(reports 2000, unable to state why she is in hospital) Current Attention Level: Sustained Memory: Decreased recall of precautions;Decreased short-term memory Following Commands: Follows one step commands consistently;Follows one step commands with increased time Safety/Judgement: Decreased awareness of safety;Decreased awareness of deficits Awareness: Emergent Problem Solving: Slow processing;Decreased initiation;Difficulty sequencing;Requires verbal cues;Requires tactile cues General Comments: pt with very impaired co-ordination, delayed processing requiring maximal       General Comments General comments (skin integrity, edema, etc.): VSS, father present    Exercises     Assessment/Plan    PT Assessment Patient needs continued PT services  PT Problem List Decreased activity tolerance;Decreased mobility;Decreased balance;Decreased  coordination;Decreased cognition;Decreased knowledge of use of DME;Decreased safety awareness       PT Treatment Interventions DME instruction;Gait training;Stair training;Functional mobility training;Therapeutic activities;Therapeutic exercise;Balance training;Neuromuscular re-education;Cognitive remediation;Patient/family education    PT Goals (Current goals can be found in the Care Plan section)  Acute Rehab PT Goals Patient Stated Goal: didn't state PT Goal Formulation: With patient Time For Goal Achievement: 01/17/19 Potential to Achieve Goals: Good Additional Goals Additional Goal #1: Pt to score >19 on DGI to indicate minimal falls risk.    Frequency Min 4X/week   Barriers to discharge Other (comment) family reports someone will be there with her    Co-evaluation PT/OT/SLP Co-Evaluation/Treatment: Yes Reason for Co-Treatment: Complexity of the patient's impairments (multi-system involvement);For patient/therapist safety PT goals addressed during session: Mobility/safety with mobility OT goals addressed during session: ADL's and self-care       AM-PAC PT "6 Clicks" Mobility  Outcome Measure Help needed turning from your back to your side while in a flat bed without using bedrails?: A Little Help needed moving from lying on your back to sitting on the side of a flat bed without using bedrails?: A Little Help needed moving to and from a bed to a chair (including a wheelchair)?: A Little Help needed standing up from a chair using your arms (e.g., wheelchair or bedside chair)?: A Little Help needed to walk in hospital room?: A Lot Help needed climbing 3-5 steps with a railing? 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

## 2019-01-03 NOTE — ED Notes (Signed)
ED TO INPATIENT HANDOFF REPORT  Name/Age/Gender [name] 38 y.o. female  Code Status    Code Status Orders  (From admission, onward)         Start     Ordered   01/02/19 2348  Full code  Continuous     01/02/19 2349        Code Status History    This patient has a current code status but no historical code status.      Home/SNF/Other Home  Chief Complaint panic anxiety  Level of Care/Admitting Diagnosis ED Disposition    ED Disposition Condition [facility] Area: [place] [[id number]]  Level of Care: ICU [6]  Diagnosis: Acute ischemic stroke [facility]) [[id number]]  Admitting Physician: Amie [place] [[id number]]  Attending Physician: Amie [place] [[id number]]  Estimated length of stay: 5 - 7 days  Certification:: I certify this patient will need inpatient services for at least 2 midnights  Bed request comments: 4n  PT Class (Do Not Modify): Inpatient [101]  PT Acc Code (Do Not Modify): Private [1]       Medical History Past Medical History:  Diagnosis Date  . ADD (attention deficit disorder)   . Exercise-induced asthma   . Hx of varicella   . Kidney stones   . S/P cesarean section 05/05/2016  . S/P tubal ligation 05/05/2016  . Term pregnancy, repeat 05/04/2016    Allergies No Known Allergies  IV Location/Drains/Wounds Patient Lines/Drains/Airways Status   Active Line/Drains/Airways    Name:   Placement date:   Placement time:   Site:   Days:   Peripheral IV 01/02/19 Left Hand   01/02/19    0800    Hand   1   Peripheral IV 01/02/19 Left Antecubital   01/02/19    2024    Antecubital   1   Incision (Closed) 05/05/16 Abdomen Other (Comment)   05/05/16    0945     973   Incision (Closed) 05/05/16 Perineum Other (Comment)   05/05/16    0945     973          Labs/Imaging Results for orders placed or performed during the hospital encounter of 01/02/19 (from the past 48 hour(s))  CBC with Differential     Status: Abnormal   Collection Time: 01/02/19  9:03 AM  Result Value Ref Range   WBC 13.2 (H) 4.0 - 10.5 K/uL   RBC 4.69 3.87 - 5.11 MIL/uL   Hemoglobin 13.9 12.0 - 15.0 g/dL   HCT 42.4 36.0 - 46.0 %   MCV 90.4 80.0 - 100.0 fL   MCH 29.6 26.0 - 34.0 pg   MCHC 32.8 30.0 - 36.0 g/dL   RDW 13.4 11.5 - 15.5 %   Platelets 295 150 - 400 K/uL   nRBC 0.0 0.0 - 0.2 %   Neutrophils Relative % 80 %   Neutro Abs 10.6 (H) 1.7 - 7.7 K/uL   Lymphocytes Relative 12 %   Lymphs Abs 1.6 0.7 - 4.0 K/uL   Monocytes Relative 6 %   Monocytes Absolute 0.8 0.1 - 1.0 K/uL   Eosinophils Relative 1 %   Eosinophils Absolute 0.1 0.0 - 0.5 K/uL   Basophils Relative 0 %   Basophils Absolute 0.1 0.0 - 0.1 K/uL   Immature Granulocytes 1 %   Abs Immature Granulocytes 0.11 (H) 0.00 - 0.07 K/uL    Comment: Performed at [facility], [place] [address] [place],  [place] [address]  Comprehensive metabolic panel     Status: Abnormal   Collection Time: 01/02/19  9:03 AM  Result Value Ref Range   Sodium 137 135 - 145 mmol/L   Potassium 3.3 (L) 3.5 - 5.1 mmol/L   Chloride 108 98 - 111 mmol/L   CO2 20 (L) 22 - 32 mmol/L   Glucose, Bld 179 (H) 70 - 99 mg/dL   BUN 11 6 - 20 mg/dL   Creatinine, Ser 0.63 0.44 - 1.00 mg/dL   Calcium 8.2 (L) 8.9 - 10.3 mg/dL   Total Protein 7.5 6.5 - 8.1 g/dL   Albumin 4.3 3.5 - 5.0 g/dL   AST 29 15 - 41 U/L   ALT 23 0 - 44 U/L   Alkaline Phosphatase 73 38 - 126 U/L   Total Bilirubin 0.1 (L) 0.3 - 1.2 mg/dL   GFR calc non Af Amer >60 >60 mL/min   GFR calc Af Amer >60 >60 mL/min   Anion gap 9 5 - 15    Comment: Performed at [facility], [place] [address], [place], [place] [address]  Salicylate level     Status: None   Collection Time: 01/02/19  9:03 AM  Result Value Ref Range   Salicylate Lvl <6.4 2.8 - 30.0 mg/dL    Comment: Performed at [facility], [place] [address] [place], [place] [address]  Acetaminophen level     Status: Abnormal   Collection  Time: 01/02/19  9:03 AM  Result Value Ref Range   Acetaminophen (Tylenol), Serum <10 (L) 10 - 30 ug/mL    Comment: (NOTE) Therapeutic concentrations vary significantly. A range of 10-30 ug/mL  may be an effective concentration for many patients. However, some  are best treated at concentrations outside of this range. Acetaminophen concentrations >150 ug/mL at 4 hours after ingestion  and >50 ug/mL at 12 hours after ingestion are often associated with  toxic reactions. Performed at [facility], [place] [address] [place], [place] [address]   Rapid urine drug screen (hospital performed)     Status: Abnormal   Collection Time: 01/02/19  7:23 PM  Result Value Ref Range   Opiates NONE DETECTED NONE DETECTED   Cocaine NONE DETECTED NONE DETECTED   Benzodiazepines NONE DETECTED NONE DETECTED   Amphetamines POSITIVE (A) NONE DETECTED   Tetrahydrocannabinol NONE DETECTED NONE DETECTED   Barbiturates NONE DETECTED NONE DETECTED    Comment: (NOTE) DRUG SCREEN FOR MEDICAL PURPOSES ONLY.  IF CONFIRMATION IS NEEDED FOR ANY PURPOSE, NOTIFY LAB WITHIN 5 DAYS. LOWEST DETECTABLE LIMITS FOR URINE DRUG SCREEN Drug Class                     Cutoff (ng/mL) Amphetamine and metabolites    1000 Barbiturate and metabolites    200 Benzodiazepine                 660 Tricyclics and metabolites     300 Opiates and metabolites        300 Cocaine and metabolites        300 THC                            50 Performed at [facility], [place] [address], [place],  [address]   Ethanol     Status: None   Collection Time: 01/02/19  7:23 PM  Result Value Ref Range   Alcohol, Ethyl (  B) <10 <10 mg/dL    Comment: (NOTE) Lowest detectable limit for serum alcohol is 10 mg/dL. For medical purposes only. Performed at [facility], [place] [address] [place], [place] [address]   Ammonia     Status: None   Collection Time: 01/02/19  7:23 PM  Result Value  Ref Range   Ammonia 18 9 - 35 umol/L    Comment: Performed at [facility], [place] [address] [place], [place] [address]  Urinalysis, Routine w reflex microscopic     Status: Abnormal   Collection Time: 01/02/19  7:23 PM  Result Value Ref Range   Color, Urine YELLOW YELLOW   APPearance HAZY (A) CLEAR   Specific Gravity, Urine 1.014 1.005 - 1.030   pH 6.0 5.0 - 8.0   Glucose, UA 150 (A) NEGATIVE mg/dL   Hgb urine dipstick MODERATE (A) NEGATIVE   Bilirubin Urine NEGATIVE NEGATIVE   Ketones, ur NEGATIVE NEGATIVE mg/dL   Protein, ur NEGATIVE NEGATIVE mg/dL   Nitrite NEGATIVE NEGATIVE   Leukocytes,Ua NEGATIVE NEGATIVE   RBC / HPF 21-50 0 - 5 RBC/hpf   WBC, UA 0-5 0 - 5 WBC/hpf   Bacteria, UA RARE (A) NONE SEEN   Squamous Epithelial / LPF 0-5 0 - 5   Mucus PRESENT     Comment: Performed at [facility], [place] [address] [place], [place] [address]  Lactic acid, plasma     Status: None   Collection Time: 01/02/19  7:24 PM  Result Value Ref Range   Lactic Acid, Venous 1.5 0.5 - 1.9 mmol/L    Comment: Performed at [facility], [place] [address] [place], [place] [address]  TSH     Status: None   Collection Time: 01/02/19 10:22 PM  Result Value Ref Range   TSH 1.321 0.350 - 4.500 uIU/mL    Comment: Performed by a 3rd Generation assay with a functional sensitivity of <=0.01 uIU/mL. Performed at [facility], [place] [address] [place], [place] [address]   Sodium     Status: None   Collection Time: 01/02/19 10:22 PM  Result Value Ref Range   Sodium 137 135 - 145 mmol/L    Comment: Performed at [facility], [place] [address], [place], [place] [address]   Ct Angio Head W Or Wo Contrast  Result Date: 01/02/2019 CLINICAL DATA:  Initial evaluation for acute lethargy, vertigo. EXAM: CT ANGIOGRAPHY HEAD AND NECK TECHNIQUE: Multidetector CT imaging of the head and neck was performed using the standard protocol  during bolus administration of intravenous contrast. Multiplanar CT image reconstructions and MIPs were obtained to evaluate the vascular anatomy. Carotid stenosis measurements (when applicable) are obtained utilizing NASCET criteria, using the distal internal carotid diameter as the denominator. CONTRAST:  37mL ISOVUE-370 IOPAMIDOL (ISOVUE-370) INJECTION 76% COMPARISON:  None available. FINDINGS: CT HEAD FINDINGS Brain: Cerebral volume within normal limits. Confluent hypodensity involving the right cerebellum, compatible with evolving acute ischemic infarct. Possible more mild involvement within the adjacent left cerebellar hemisphere, although not entirely certain. Evolving small acute bilateral PCA territory infarcts, right greater than left. Evolving acute bilateral thalamic infarcts. Trace hyperdensity at the bilateral occipital poles compatible with associated petechial hemorrhage (series 7, image 58). No frank hemorrhagic transformation. Associated mass effect within the right posterior fossa with partial effacement of the fourth ventricle and basilar cistern crowding. Fourth ventricle remains patent at this time. No obstructive hydrocephalus. Remainder of the brain is normal in appearance. No mass  lesion or midline shift. No extra-axial fluid collection. Vascular: No hyperdense vessel. Skull: Scalp soft tissues and calvarium within normal limits. Sinuses: Paranasal sinuses and mastoids are clear. Orbits: Globes and orbital soft tissues within normal limits. Review of the MIP images confirms the above findings CTA NECK FINDINGS Aortic arch: Partially visualized aortic arch normal in caliber with normal branch pattern. No hemodynamically significant stenosis about the origin of the great vessels. Visualized subclavian arteries patent. Right carotid system: Right common and internal carotid arteries widely patent without stenosis, dissection, or occlusion. Left carotid system: Left common and internal carotid  arteries widely patent without stenosis, dissection, or occlusion. Vertebral arteries: Both vertebral arteries arise from the subclavian arteries. Left vertebral artery widely patent and normal in appearance. Right vertebral artery widely patent and normal in appearance throughout its P2 segment. There is multifocal intimal irregularity with associated moderate stenosis involving the left V3 segment at the level of the C1 transverse foramen (series 10, image 33). Finding compatible with acute arterial dissection. Subtle probable flap noted just distally within the right V3 segment just prior to crossing into the cranial vault (series 12, image 135). Skeleton: No acute osseous abnormality. No discrete lytic or blastic osseous lesions. Pronounced thoracic dextroscoliosis with bilateral Harrington rod fixation partially visualized. Other neck: No other acute soft tissue abnormality within the neck. Upper chest: Partially visualized upper chest demonstrates no acute finding. Review of the MIP images confirms the above findings CTA HEAD FINDINGS Anterior circulation: Internal carotid arteries widely patent to the termini without stenosis. A1 segments, anterior communicating artery, and anterior cerebral arteries widely patent. M1 segments widely patent bilaterally. Normal MCA bifurcations. Distal MCA branches well perfused and symmetric. Posterior circulation: Probable small dissection flap at the right V3/V4 junction as the right vertebral crosses into the cranial vault as above. Right V4 segment otherwise normal in appearance and caliber without acute abnormality. Left vertebral widely patent to the vertebrobasilar junction without stenosis. Patent left PICA. Right PICA not seen, and may be occluded given the acute right cerebellar infarct. Probable short fenestration within the proximal basilar artery noted (series 10, image 273). Basilar patent distally without significant stenosis or other acute finding. Superior  cerebral arteries are patent proximally. Both of the posterior cerebral artery supplied via the basilar. PCAs patent proximally. Suspected bilateral distal P3/P4 occlusions (series 12, image 126 on the left, image 140 on the right). Venous sinuses: Patent. Anatomic variants: None significant. Delayed phase: No abnormal enhancement. Review of the MIP images confirms the above findings IMPRESSION: CT HEAD IMPRESSION 1. 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 Associated trace petechial hemorrhage at the occipital poles without frank hemorrhagic transformation. 2. Associated mass effect within the right posterior fossa with partial effacement of the right basilar cisterns and fourth ventricle. No obstructive hydrocephalus at this time. CTA HEAD AND NECK IMPRESSION 1. Acute right vertebral artery dissection, involving the right V3 segment at the level of the right C1 transverse foramen, with probable small dissection flap just distally at the right V3/V4 junction. Associated moderate stenosis. 2. 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 3. Otherwise negative CTA of the head and neck. Normal left vertebral artery. 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 Critical Value/emergent results were called by telephone at the time of interpretation on 01/02/2019 at 9:40 pm to Dr. Lennice Sites , who verbally acknowledged these results. Electronically Signed  By: Jeannine Boga M.D.   On: 01/02/2019 21:45   Ct Angio Neck W And/or Wo Contrast  Result Date: 01/02/2019 CLINICAL DATA:  Initial evaluation for acute lethargy, vertigo. EXAM: CT ANGIOGRAPHY HEAD AND NECK TECHNIQUE: Multidetector CT imaging of the head and neck was performed using the standard protocol during bolus administration of intravenous contrast. Multiplanar CT image reconstructions  and MIPs were obtained to evaluate the vascular anatomy. Carotid stenosis measurements (when applicable) are obtained utilizing NASCET criteria, using the distal internal carotid diameter as the denominator. CONTRAST:  6mL ISOVUE-370 IOPAMIDOL (ISOVUE-370) INJECTION 76% COMPARISON:  None available. FINDINGS: CT HEAD FINDINGS Brain: Cerebral volume within normal limits. Confluent hypodensity involving the right cerebellum, compatible with evolving acute ischemic infarct. Possible more mild involvement within the adjacent left cerebellar hemisphere, although not entirely certain. Evolving small acute bilateral PCA territory infarcts, right greater than left. Evolving acute bilateral thalamic infarcts. Trace hyperdensity at the bilateral occipital poles compatible with associated petechial hemorrhage (series 7, image 58). No frank hemorrhagic transformation. Associated mass effect within the right posterior fossa with partial effacement of the fourth ventricle and basilar cistern crowding. Fourth ventricle remains patent at this time. No obstructive hydrocephalus. Remainder of the brain is normal in appearance. No mass lesion or midline shift. No extra-axial fluid collection. Vascular: No hyperdense vessel. Skull: Scalp soft tissues and calvarium within normal limits. Sinuses: Paranasal sinuses and mastoids are clear. Orbits: Globes and orbital soft tissues within normal limits. Review of the MIP images confirms the above findings CTA NECK FINDINGS Aortic arch: Partially visualized aortic arch normal in caliber with normal branch pattern. No hemodynamically significant stenosis about the origin of the great vessels. Visualized subclavian arteries patent. Right carotid system: Right common and internal carotid arteries widely patent without stenosis, dissection, or occlusion. Left carotid system: Left common and internal carotid arteries widely patent without stenosis, dissection, or occlusion. Vertebral arteries: Both  vertebral arteries arise from the subclavian arteries. Left vertebral artery widely patent and normal in appearance. Right vertebral artery widely patent and normal in appearance throughout its P2 segment. There is multifocal intimal irregularity with associated moderate stenosis involving the left V3 segment at the level of the C1 transverse foramen (series 10, image 33). Finding compatible with acute arterial dissection. Subtle probable flap noted just distally within the right V3 segment just prior to crossing into the cranial vault (series 12, image 135). Skeleton: No acute osseous abnormality. No discrete lytic or blastic osseous lesions. Pronounced thoracic dextroscoliosis with bilateral Harrington rod fixation partially visualized. Other neck: No other acute soft tissue abnormality within the neck. Upper chest: Partially visualized upper chest demonstrates no acute finding. Review of the MIP images confirms the above findings CTA HEAD FINDINGS Anterior circulation: Internal carotid arteries widely patent to the termini without stenosis. A1 segments, anterior communicating artery, and anterior cerebral arteries widely patent. M1 segments widely patent bilaterally. Normal MCA bifurcations. Distal MCA branches well perfused and symmetric. Posterior circulation: Probable small dissection flap at the right V3/V4 junction as the right vertebral crosses into the cranial vault as above. Right V4 segment otherwise normal in appearance and caliber without acute abnormality. Left vertebral widely patent to the vertebrobasilar junction without stenosis. Patent left PICA. Right PICA not seen, and may be occluded given the acute right cerebellar infarct. Probable short fenestration within the proximal basilar artery noted (series 10, image 273). Basilar patent distally without significant stenosis or other acute finding. Superior cerebral arteries are patent proximally. Both of the posterior cerebral  artery supplied via  the basilar. PCAs patent proximally. Suspected bilateral distal P3/P4 occlusions (series 12, image 126 on the left, image 140 on the right). Venous sinuses: Patent. Anatomic variants: None significant. Delayed phase: No abnormal enhancement. Review of the MIP images confirms the above findings IMPRESSION: CT HEAD IMPRESSION 1. 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 Associated trace petechial hemorrhage at the occipital poles without frank hemorrhagic transformation. 2. Associated mass effect within the right posterior fossa with partial effacement of the right basilar cisterns and fourth ventricle. No obstructive hydrocephalus at this time. CTA HEAD AND NECK IMPRESSION 1. Acute right vertebral artery dissection, involving the right V3 segment at the level of the right C1 transverse foramen, with probable small dissection flap just distally at the right V3/V4 junction. Associated moderate stenosis. 2. 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 3. Otherwise negative CTA of the head and neck. Normal left vertebral artery. 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 Critical Value/emergent results were called by telephone at the time of interpretation on 01/02/2019 at 9:40 pm to Dr. Lennice Sites , who verbally acknowledged these results. Electronically Signed   By: Jeannine Boga M.D.   On: 01/02/2019 21:45    Pending Labs Unresulted Labs (From admission, onward)    Start     Ordered   01/03/19 0500  Hemoglobin A1c  Tomorrow morning,   R     01/02/19 2349   01/03/19 0500  Lipid panel  Tomorrow morning,   R    Comments:  Fasting    01/02/19 2349   01/02/19 2347  HIV antibody (Routine Testing)  Once,   R     01/02/19 2349   01/02/19 2207  Sodium  Now then every 6 hours,   R     01/02/19 2206   01/02/19 2130  T4, free  ONCE - STAT,    STAT     01/02/19 2129          Vitals/Pain Today's Vitals   01/03/19 0030 01/03/19 0100 01/03/19 0130 01/03/19 0200  BP: (!) 144/87 (!) 149/101 (!) 147/97 (!) 156/83  Pulse: 73 62 (!) 55 74  Resp: 16 14 18 19   Temp:      TempSrc:      SpO2: 98% 98% 97% 95%  Weight:      Height:      PainSc:        Isolation Precautions No active isolations  Medications Medications  lidocaine (PF) (XYLOCAINE) 1 % injection 5 mL (5 mLs Infiltration Not Given 01/02/19 2217)  sodium chloride (hypertonic) 3 % solution ( Intravenous Paused 01/02/19 2237)   stroke: mapping our early stages of recovery book (has no administration in time range)  acetaminophen (TYLENOL) tablet 650 mg (has no administration in time range)    Or  acetaminophen (TYLENOL) solution 650 mg (has no administration in time range)    Or  acetaminophen (TYLENOL) suppository 650 mg (has no administration in time range)  senna-docusate (Senokot-S) tablet 1 tablet (has no administration in time range)  potassium chloride 10 mEq in 100 mL IVPB ( Intravenous Rate/Dose Verify 01/03/19 0210)  sodium chloride 0.9 % bolus 500 mL (0 mLs Intravenous Stopped 01/02/19 1221)  sodium chloride 0.9 % bolus 500 mL (0 mLs Intravenous Stopped 01/02/19 1521)  lactated ringers bolus 1,000 mL (0 mLs Intravenous Stopped 01/02/19 2030)  diphenhydrAMINE (BENADRYL) injection  50 mg (50 mg Intravenous Given 01/02/19 1932)  iopamidol (ISOVUE-370) 76 % injection 100 mL (80 mLs Intravenous Contrast Given 01/02/19 2043)  sodium chloride (PF) 0.9 % injection (  Given by Other 01/03/19 0152)    Mobility walks with person assist

## 2019-01-03 NOTE — H&P (Addendum)
Physical Medicine and Rehabilitation Admission H&P    Chief Complaint  Patient presents with  . Anxiety    panic   : HPI: [name] is a 38 year old right-handed female with history of ADHD maintained on Adderall and Xanax, panic attacks, exercise-induced asthma. Per chart review and patient, patient lives with spouse. Independent prior to admission. Two-level home 5 steps to entry and bedroom on main level. She has 4 children ages 74 through 49. Family planning assistance as needed. Presented to 01/02/2019 with lethargy, dizziness and anxiety with bouts of nausea as well as headache and neck pain. Patient could not recall any type of fall or neck hyperextension injury prior to admission. 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 Associated trace petechial hemorrhage in the occipital poles without frank hemorrhagic conversion. 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 Probable distal bilateral P3-P4 occlusion. Urine drug screen positive for amphetamines. MRI the brain showed multifocal areas of acute infarction, predominantly affecting the right cerebellum reflecting right vertebral dissection but also areas of acute infarction elsewhere in the posterior circulation notably both thalami, left temporal lobe. Noted significant mass effect on the outlet foramina the fourth ventricle without current features of hydrocephalus. Neurosurgery follow-up currently with conservative care.  Repeat CT reviewed, scan with slightly worsening compression of fourth ventricle, read pending.  Patient completed a course of 3% normal saline. Maintain on aspirin for CVA prophylaxis. Tolerating a regular diet. Therapy evaluations completed with recommendations of physical medicine  rehabilitation consult. Patient was admitted for a comprehensive rehabilitation program. Patient was planned to be admitted today, however, due to increase in fluid on CT, will hold per Neurology and monitor overnight.   Review of Systems  Constitutional: Negative for fever.  HENT: Negative for hearing loss.   Eyes: Positive for double vision. Negative for blurred vision.  Respiratory: Negative for cough.        Shortness of breath with exertion  Cardiovascular: Negative for chest pain, palpitations and leg swelling.  Gastrointestinal: Positive for constipation. Negative for nausea and vomiting.  Genitourinary: Negative for dysuria, flank pain and hematuria.  Musculoskeletal: Positive for neck pain.  Skin: Negative for rash.  Neurological: Positive for dizziness and headaches.  Psychiatric/Behavioral: Negative for depression.       Panic attacks  All other systems reviewed and are negative.  Past Medical History:  Diagnosis Date  . ADD (attention deficit disorder)   . Exercise-induced asthma   . Hx of varicella   . Kidney stones   . S/P cesarean section 05/05/2016  . S/P tubal ligation 05/05/2016  . Term pregnancy, repeat 05/04/2016   Past Surgical History:  Procedure Laterality Date  . BACK SURGERY     scoliosis age 89  . BILATERAL SALPINGECTOMY Bilateral 05/05/2016   Procedure: BILATERAL SALPINGECTOMY;  Surgeon: Janyth Contes, MD;  Location: [facility];  Service: Obstetrics;  Laterality: Bilateral;  . CESAREAN SECTION    . CESAREAN SECTION N/A 05/05/2016   Procedure: CESAREAN SECTION;  Surgeon: Janyth Contes, MD;  Location: [facility];  Service: Obstetrics;  Laterality: N/A;  . CHOLECYSTECTOMY    . HAND SURGERY    . HERNIA REPAIR    . KIDNEY STONE SURGERY     R--surgery; L lithotripsy.  Dr. Gaynelle Arabian  Family History  Problem Relation Age of Onset  . Cancer Mother   . Cancer Maternal Aunt   . Cancer Maternal Grandmother    Social History:   reports that she has never smoked. She has never used smokeless tobacco. She reports that she does not drink alcohol or use drugs. Allergies: No Known Allergies Medications Prior to Admission  Medication Sig Dispense Refill  . [START ON 01/10/2019] ADDERALL XR 20 MG 24 hr capsule TAKE 1 CAPSULE BY MOUTH 2 TIMES DAILY. EFFECTIVE 08/14/18 (Patient taking differently: Take 20 mg by mouth 2 (two) times daily. ) 60 capsule 0  . amphetamine-dextroamphetamine (ADDERALL) 10 MG tablet TAKE 1 TABLET (10 MG TOTAL) BY MOUTH DAILY. 30 tablet 0  . ibuprofen (ADVIL,MOTRIN) 200 MG tablet Take 800 mg by mouth every 6 (six) hours as needed for moderate pain.    Marland Kitchen sertraline (ZOLOFT) 100 MG tablet Take 100 mg by mouth at bedtime.     . ALPRAZolam (XANAX) 0.25 MG tablet Take 1 tablet (0.25 mg total) by mouth 2 (two) times daily as needed for anxiety. (Patient not taking: Reported on 11/02/2018) 20 tablet 0  . amoxicillin (AMOXIL) 875 MG tablet Take 1 tablet (875 mg total) by mouth 2 (two) times daily. (Patient not taking: Reported on 01/02/2019) 20 tablet 0  . amphetamine-dextroamphetamine (ADDERALL XR) 20 MG 24 hr capsule Take 1 capsule (20 mg total) by mouth 2 (two) times daily. (Patient not taking: Reported on 01/02/2019) 60 capsule 0  . amphetamine-dextroamphetamine (ADDERALL XR) 20 MG 24 hr capsule Take 1 capsule (20 mg total) by mouth 2 (two) times daily. (Patient not taking: Reported on 01/02/2019) 60 capsule 0    Drug Regimen Review Drug regimen was reviewed and remains appropriate with no significant issues identified  Home: Home Living Family/patient expects to be discharged to:: Private residence Living Arrangements: Spouse/significant other, Children Available Help at Discharge: Family, Available 24 hours/day Type of Home: House Home Access: Stairs to enter Technical brewer of Steps: 5 Home Layout: Two level Alternate Level Stairs-Number of Steps: flight Bathroom Shower/Tub: Clinical cytogeneticist: Standard Home Equipment: None  Lives With: Spouse(4 kids)   Functional History: Prior Function Level of Independence: Independent Comments: 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  Needed cues to slow down due to impulsivitiy.  Transfers Overall transfer level: Needs assistance Equipment used: 2 person hand held assist Transfers: Sit to/from Stand Sit to Stand: Min assist, +2 safety/equipment, +2 physical assistance General transfer comment: min assist to power up and steady with +2, B UE hand support  required Ambulation/Gait Ambulation/Gait assistance: Mod assist, +2 physical assistance, +2 safety/equipment Gait Distance (Feet): 250 Feet(30 feet, sitting rest, then 220 feet) Assistive device: 2 person hand held assist Gait Pattern/deviations: Step-through pattern, Ataxic, Narrow base of support General Gait Details: pt with significantly impaired R LE co-ordination, motor planning and sequencing. Pt with ataxic, occasionaly scissoring stepping pattern, pt reports dizziness most likely due to cerebellar involvement.  Pt wore the occluded glasses which seemed to help balance some. Still reports double vision.   Gait velocity: decreased Gait velocity interpretation: <1.8 ft/sec, indicate of risk for recurrent falls    ADL: ADL Overall ADL's : Needs  assistance/impaired Grooming: Wash/dry hands, Minimal assistance, Standing(+2 safety standing ) Upper Body Bathing: Minimal assistance, Sitting Lower Body Bathing: Moderate assistance, Sit to/from stand, +2 for safety/equipment, +2 for physical assistance Upper Body Dressing : Moderate  assistance, Sitting Lower Body Dressing: Moderate assistance, +2 for physical assistance, +2 for safety/equipment, Sit to/from stand Toilet Transfer: Minimal assistance, Moderate assistance, +2 for physical assistance, +2 for safety/equipment, Ambulation, Grab bars, Regular Toilet Toilet Transfer Details (indicate cue type and reason): patient requires +2 for safety, mod assist during ambulation but min assist for transfers  Toileting- Clothing Manipulation and Hygiene: Moderate assistance, +2 for physical assistance, +2 for safety/equipment, Sit to/from stand Toileting - Clothing Manipulation Details (indicate cue type and reason): assist with clothing mgmt down and steadying assist for 0 hand support to manage clohting up  Functional mobility during ADLs: Moderate assistance, +2 for physical assistance, +2 for safety/equipment, Cueing for safety, Cueing for sequencing  Cognition: Cognition Overall Cognitive Status: Impaired/Different from baseline Arousal/Alertness: Awake/alert Orientation Level: Oriented X4 Attention: Alternating, Sustained Sustained Attention: Appears intact Alternating Attention: Impaired Memory: Appears intact Awareness: Appears intact Problem Solving: Appears intact Executive Function: Organizing, Self Correcting Organizing: Impaired Organizing Impairment: Functional complex, Verbal complex Self Correcting: Impaired Self Correcting Impairment: Functional complex, Verbal complex Safety/Judgment: (unable to assess) Cognition Arousal/Alertness: Lethargic Behavior During Therapy: Flat affect Overall Cognitive Status: Impaired/Different from baseline Area of Impairment: Attention, Following commands Orientation Level: Time, Situation Current Attention Level: Sustained Memory: Decreased recall of precautions, Decreased short-term memory Following Commands: Follows one step commands consistently, Follows one step commands with increased time Safety/Judgement:  Decreased awareness of safety, Decreased awareness of deficits Awareness: Emergent Problem Solving: Slow processing, Decreased initiation, Difficulty sequencing, Requires verbal cues, Requires tactile cues General Comments: pt with very impaired co-ordination, delayed processing   Physical Exam: Blood pressure (!) 151/79, pulse 61, temperature 98.4 F (36.9 C), temperature source Oral, resp. rate 11, height 5\' 4"  (1.626 m), weight 86.2 kg, last menstrual period 12/17/2018, SpO2 95 %. Physical Exam  Constitutional: She is oriented to person, place, and time. She appears well-developed.  Obese  HENT:  Head: Normocephalic and atraumatic.  Eyes: Right eye exhibits no discharge. Left eye exhibits no discharge.  Neck: Normal range of motion. Neck supple.  Cardiovascular: Normal rate and regular rhythm. Exam reveals no friction rub.  No murmur heard. Respiratory: Effort normal and breath sounds normal.  GI: Soft. Bowel sounds are normal. She exhibits no distension. There is no abdominal tenderness.  Musculoskeletal: Normal range of motion.        General: No tenderness, deformity or edema.     Comments:    Neurological: She is alert and oriented to person, place, and time.  Improved insight and awareness. Moves all 4's. Tracks to all fields all though right eye lags slightly behind left during tracking. 2-3 beats of nystagmus with lateral gaze left and right. No obvious CN abnl. Complains of more diplopia with gaze to right. Strength 4/5 RUE 5/5 LUE , RLE 4 to 4+/5. LLE 5/5. Decreased fmc RUE more than left with dysmetria and mild ataxia. Senses pain in all 4's. DTR's are brisk and 2-3+ in all 4     Skin: Skin is warm and dry.  Psychiatric: She has a normal mood and affect. Her behavior is normal. Thought content normal.    Results for orders placed or performed during the hospital encounter of 01/02/19 (from the past 48 hour(s))  Sodium     Status: None   Collection Time:  01/03/19  9:46 AM    Result Value Ref Range   Sodium 141 135 - 145 mmol/L    Comment: Performed at [facility] [address], [place], [place] [address]  Sodium     Status: None   Collection Time: 01/03/19  4:00 PM  Result Value Ref Range   Sodium 141 135 - 145 mmol/L    Comment: Performed at [facility] [address] [place], [place] [address]  Sodium     Status: None   Collection Time: 01/03/19 10:55 PM  Result Value Ref Range   Sodium 141 135 - 145 mmol/L    Comment: Performed at [facility], [place] [address] [place], [place] [address]  Sodium     Status: None   Collection Time: 01/04/19  4:08 AM  Result Value Ref Range   Sodium 140 135 - 145 mmol/L    Comment: Performed at [facility] [address], [place], [place] [address]  Sodium     Status: None   Collection Time: 01/04/19 11:59 AM  Result Value Ref Range   Sodium 140 135 - 145 mmol/L    Comment: Performed at [facility] [address], [place], [place] [address]   Mr Brain Wo Contrast  Result Date: 01/03/2019 CLINICAL DATA:  Follow-up vertebral dissection.  Dizziness. EXAM: MRI HEAD WITHOUT CONTRAST TECHNIQUE: Multiplanar, multiecho pulse sequences of the brain and surrounding structures were obtained without intravenous contrast. COMPARISON:  CT head and CTA head neck 01/02/2019, 01/03/2019. FINDINGS: Brain: Large area of restricted diffusion affects much of the RIGHT cerebellum, portion of the RIGHT PICA territory, RIGHT AICA, and RIGHT SCA vascular distributions. Additional areas of restricted diffusion involve the RIGHT occipital lobe, RIGHT thalamus, LEFT thalamus, and LEFT posterior temporal lobe. Mild mass effect on the brainstem. No definite hemorrhagic transformation. Effacement of the fourth ventricular outlet foramina, and RIGHT-to-LEFT displacement of the fourth ventricle, without frank hydrocephalus. Brain otherwise normal. Vascular: Asymmetric abnormal flow void in the RIGHT vertebral reflects  the dissection, seen better on CTA. Skull and upper cervical spine: Normal marrow signal. No tonsillar herniation. Sinuses/Orbits: No sinus or orbital disease. Other: None. IMPRESSION: Multifocal areas of acute infarction, predominantly affecting the RIGHT cerebellum reflecting RIGHT vertebral dissection, but also areas of acute infarction elsewhere in the posterior circulation, notably both thalami, LEFT temporal lobe, and RIGHT occipital lobe reflecting thalamoperforate and BILATERAL PCA territory ischemia, consistent with distal emboli. Abnormal flow void RIGHT vertebral reflecting the previously identified dissection. No features to suggest hemorrhagic transformation. Significant mass effect on the outlet foramina the fourth ventricle without current features of hydrocephalus. Close continued surveillance is warranted. Electronically Signed   By: Staci Righter M.D.   On: 01/03/2019 14:27       Medical Problem List and Plan: 1.  Decreased functional mobility with ataxic gait and associated dizziness, nausea, headache secondary to right cerebellar and bilateral PCA infarctions from right vertebral artery dissection  Admit to inpatient rehab  ECASA 325mg  daily 2.  DVT Prophylaxis/Anticoagulation: SCDs. Monitor for any signs of DVT 3. Pain Management: Tylenol as needed  Consider adjustments to medications as necessary 4. Mood/ADD/panic attacks:  Zoloft 100 mg daily at bedtime  -team and family to provide ego support  -family in room and appear very supportive 5. Neuropsych: This patient is  capable of making decisions on her own behalf. 6. Skin/Wound Care:  Routine skin checks 7. Fluids/Electrolytes/Nutrition:  Routine in and out's with  follow-up chemistries  -encourage PO 8.  Elevated blood pressure: Monitor with increased mobility 9. Constipation: augment bowel regimen  -sorbitol on admit,   -dulcolax supp, fleet enema if needed  Cathlyn Parsons, PA-C 01/05/2019

## 2019-01-03 NOTE — ED Notes (Signed)
Recliner brought in to comfort husband

## 2019-01-03 NOTE — ED Notes (Signed)
Report given to [facility]. 

## 2019-01-03 NOTE — Progress Notes (Signed)
  Echocardiogram 2D Echocardiogram has been performed.  [name] M 01/03/2019, 3:36 PM

## 2019-01-03 NOTE — Progress Notes (Signed)
Stable this morning.  Patient able to stand and ambulate in the room with aid of therapy.  Gait is unsteady.  Neurologically unchanged.  Continue ICU observation.  I am encouraged that the patient is unlikely to need decompressive surgery.

## 2019-01-03 NOTE — Consult Note (Addendum)
Physical Medicine and Rehabilitation Consult Reason for Consult:  Decreased functional mobility Referring Physician:   [facility]   HPI: [name] is a 38 y.o.right handed female with history of ADD maintained on Adderall and Xanax, panic attacks, exercise-induced asthma. Per chart review patient lives with spouse. Independent prior to admission. Two-level home with 5 steps to entry. She has 4 children ages 81 through 74. Family planning assistance as needed. Presented to 01/02/2019 with lethargy, dizziness and anxiety with bouts of nausea. 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 Associated trace petechial hemorrhage in the occipital poles without frank hemorrhagic transformation. 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 Probable distal bilateral P3-P4 occlusion. Urine drug screen positive amphetamines. MRI of the brain is pending. Neurosurgery follow-up currently with conservative care.  Patient is still on 3% normal saline.Currently on aspirin for CVA prophylaxis. Tolerating a regular diet. Therapy evaluation completed with recommendations of physical medicine rehabilitation consult.  Patient is lethargic but can be awakened to voice and physical stim.  Husband at bedside very helpful.  Patient does not recall any type of fall or neck hyperextension injury prior to admission.  Review of Systems  Constitutional: Positive for malaise/fatigue. Negative for fever.  HENT: Negative for hearing loss.   Eyes: Negative for blurred vision and double vision.  Respiratory: Negative for cough and shortness of breath.   Cardiovascular: Negative for chest pain and palpitations.  Gastrointestinal: Positive for constipation. Negative for nausea and vomiting.    Genitourinary: Negative for dysuria and hematuria.  Musculoskeletal: Positive for neck pain.  Skin: Negative for rash.  Neurological: Positive for dizziness and headaches. Negative for seizures.  Psychiatric/Behavioral:       Anxiety and panic attacks  All other systems reviewed and are negative.  Past Medical History:  Diagnosis Date  . ADD (attention deficit disorder)   . Exercise-induced asthma   . Hx of varicella   . Kidney stones   . S/P cesarean section 05/05/2016  . S/P tubal ligation 05/05/2016  . Term pregnancy, repeat 05/04/2016   Past Surgical History:  Procedure Laterality Date  . BACK SURGERY     scoliosis age 70  . BILATERAL SALPINGECTOMY Bilateral 05/05/2016   Procedure: BILATERAL SALPINGECTOMY;  Surgeon: Janyth Contes, MD;  Location: [place];  Service: Obstetrics;  Laterality: Bilateral;  . CESAREAN SECTION    . CESAREAN SECTION N/A 05/05/2016   Procedure: CESAREAN SECTION;  Surgeon: Janyth Contes, MD;  Location: [place];  Service: Obstetrics;  Laterality: N/A;  . CHOLECYSTECTOMY    . HAND SURGERY    . HERNIA REPAIR    . KIDNEY STONE SURGERY     R--surgery; L lithotripsy.  Dr. Gaynelle Arabian   Family History  Problem Relation Age of Onset  . Cancer Mother   . Cancer Maternal Aunt   . Cancer Maternal Grandmother    Social History:  reports that she has never smoked. She has never used smokeless tobacco. She reports that she does not drink alcohol or use drugs. Allergies: No Known Allergies Medications Prior to Admission  Medication Sig Dispense Refill  . [START ON 01/10/2019] ADDERALL XR 20 MG 24 hr capsule TAKE 1 CAPSULE BY MOUTH 2 TIMES DAILY. EFFECTIVE 08/14/18 (Patient taking differently: Take 20 mg by mouth  2 (two) times daily. ) 60 capsule 0  . amphetamine-dextroamphetamine (ADDERALL) 10 MG tablet TAKE 1 TABLET (10 MG TOTAL) BY MOUTH DAILY. 30 tablet 0  . ibuprofen (ADVIL,MOTRIN) 200 MG tablet Take 800 mg by mouth every 6  (six) hours as needed for moderate pain.    Marland Kitchen sertraline (ZOLOFT) 100 MG tablet Take 100 mg by mouth at bedtime.     . ALPRAZolam (XANAX) 0.25 MG tablet Take 1 tablet (0.25 mg total) by mouth 2 (two) times daily as needed for anxiety. (Patient not taking: Reported on 11/02/2018) 20 tablet 0  . amoxicillin (AMOXIL) 875 MG tablet Take 1 tablet (875 mg total) by mouth 2 (two) times daily. (Patient not taking: Reported on 01/02/2019) 20 tablet 0  . amphetamine-dextroamphetamine (ADDERALL XR) 20 MG 24 hr capsule Take 1 capsule (20 mg total) by mouth 2 (two) times daily. (Patient not taking: Reported on 01/02/2019) 60 capsule 0  . amphetamine-dextroamphetamine (ADDERALL XR) 20 MG 24 hr capsule Take 1 capsule (20 mg total) by mouth 2 (two) times daily. 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, drives, has 4 children (ages 45-15) Functional Status:  Mobility: Bed Mobility Overal bed mobility: Needs Assistance Bed Mobility: Supine to Sit Supine to sit: Min assist, +2 for safety/equipment, HOB elevated General bed mobility comments: min assist to ascend trunk, transfer towards L side of bed with cueing for safety and sequencing  Transfers Overall transfer level: Needs assistance Equipment used: 2 person hand held assist Transfers: Sit to/from Stand Sit to Stand: Min assist, +2 safety/equipment, +2 physical assistance General transfer  comment: min assist to power up and steady with +2, B UE hand support  required      ADL: ADL Overall ADL's : Needs assistance/impaired Grooming: Wash/dry hands, Minimal assistance, Standing(+2 safety standing ) Upper Body Bathing: Minimal assistance, Sitting Lower Body Bathing: Moderate assistance, Sit to/from stand, +2 for safety/equipment, +2 for physical assistance Upper Body Dressing : Moderate assistance, Sitting Lower Body Dressing: Moderate assistance, +2 for physical assistance, +2 for safety/equipment, Sit to/from stand Toilet Transfer: Minimal assistance, Moderate assistance, +2 for physical assistance, +2 for safety/equipment, Ambulation, Grab bars, Regular Toilet Toilet Transfer Details (indicate cue type and reason): patient requires +2 for safety, mod assist during ambulation but min assist for transfers  Toileting- Clothing Manipulation and Hygiene: Moderate assistance, +2 for physical assistance, +2 for safety/equipment, Sit to/from stand Toileting - Clothing Manipulation Details (indicate cue type and reason): assist with clothing mgmt down and steadying assist for 0 hand support to manage clohting up  Functional mobility during ADLs: Moderate assistance, +2 for physical assistance, +2 for safety/equipment, Cueing for safety, Cueing for sequencing  Cognition: Cognition Overall Cognitive Status: Impaired/Different from baseline Orientation Level: Oriented to person, Oriented to place, Disoriented to situation, Disoriented to time Cognition Arousal/Alertness: Awake/alert(initally lethargic but improves during session) Behavior During Therapy: Flat affect Overall Cognitive Status: Impaired/Different from baseline Area of Impairment: Orientation, Attention, Memory, Following commands, Safety/judgement, Awareness, Problem solving Orientation Level: Time, Situation(reports 2000) Current Attention Level: Sustained  Memory: Decreased recall of precautions, Decreased short-term  memory Following Commands: Follows one step commands consistently, Follows one step commands with increased time Safety/Judgement: Decreased awareness of safety, Decreased awareness of deficits Awareness: Emergent Problem Solving: Slow processing, Decreased initiation, Difficulty sequencing, Requires verbal cues, Requires tactile cues General Comments: patient requires increased time to process and follow commands, cueing for problem sovling and awareness; some expressive difficulties, appears with limited recall of situation, tends to repeat "situtation" of what she heard last (initally reporting back pain, then BP, then headache)    Blood pressure 130/89, pulse (!) 52, temperature 98.7 F (37.1 C), temperature source Oral, resp. rate 18, height 5\' 4"  (1.626 m), weight 86.2 kg, last menstrual period 12/17/2018, SpO2 98 %. Physical Exam  Vitals reviewed. HENT:  Head: Normocephalic.  Eyes:  Pupils reactive to light  Neck: Normal range of motion. Neck supple. No thyromegaly present.  Cardiovascular: Normal rate and regular rhythm.  Respiratory: Effort normal and breath sounds normal. No respiratory distress.  GI: Soft. Bowel sounds are normal. She exhibits no distension.  Neurological: She is alert.  Follows commands  Provides her name ,age ,place .Marland KitchenFair awareness of deficits.  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 with diplopia on lateral gaze Sensation reported as equal to light touch in both upper and lower limbs   Eyes without evidence of nystagmus  Tone is normal without evidence of spasticity   Cranial nerves II- Visual fields are intact to confrontation testing, no blurring of vision III- no evidence of ptosis, upward, downward and medial  gaze intact IV- no vertical diplopia or head tilt V- no facial numbness or masseter weakness VI- no pupil abduction weakness VII- no facial droop, good lid closure VII- normal auditory acuity IX- no pharygeal weakness, uvula midline X- no pharyngeal weakness, no hoarseness XI- no trap or SCM weakness XII- no glossal weakness    Results for orders placed or performed during the hospital encounter of 01/02/19 (from the past 24 hour(s))  Rapid urine drug screen (hospital performed)     Status: Abnormal   Collection Time: 01/02/19  7:23 PM  Result Value Ref Range   Opiates NONE DETECTED NONE DETECTED   Cocaine NONE DETECTED NONE DETECTED   Benzodiazepines NONE DETECTED NONE DETECTED   Amphetamines POSITIVE (A) NONE DETECTED   Tetrahydrocannabinol NONE DETECTED NONE DETECTED   Barbiturates NONE DETECTED NONE DETECTED  Ethanol     Status: None   Collection Time: 01/02/19  7:23 PM  Result Value Ref Range   Alcohol, Ethyl (B) <10 <10 mg/dL  Ammonia     Status: None   Collection Time: 01/02/19  7:23 PM  Result Value Ref Range   Ammonia 18 9 - 35 umol/L  Urinalysis, Routine w reflex microscopic     Status: Abnormal   Collection Time: 01/02/19  7:23 PM  Result Value Ref Range   Color, Urine YELLOW YELLOW   APPearance HAZY (A) CLEAR   Specific Gravity, Urine 1.014 1.005 - 1.030   pH 6.0 5.0 - 8.0   Glucose, UA 150 (A) NEGATIVE mg/dL   Hgb urine dipstick MODERATE (A) NEGATIVE   Bilirubin Urine NEGATIVE NEGATIVE   Ketones, ur NEGATIVE NEGATIVE mg/dL   Protein, ur NEGATIVE NEGATIVE mg/dL   Nitrite NEGATIVE NEGATIVE   Leukocytes,Ua NEGATIVE NEGATIVE   RBC / HPF  21-50 0 - 5 RBC/hpf   WBC, UA 0-5 0 - 5 WBC/hpf   Bacteria, UA RARE (A) NONE SEEN   Squamous Epithelial / LPF 0-5 0 - 5   Mucus PRESENT   Lactic acid, plasma     Status: None   Collection Time: 01/02/19  7:24 PM  Result Value Ref Range   Lactic Acid, Venous 1.5 0.5 - 1.9 mmol/L  TSH     Status: None   Collection Time:  01/02/19 10:22 PM  Result Value Ref Range   TSH 1.321 0.350 - 4.500 uIU/mL  T4, free     Status: None   Collection Time: 01/02/19 10:22 PM  Result Value Ref Range   Free T4 0.92 0.82 - 1.77 ng/dL  Sodium     Status: None   Collection Time: 01/02/19 10:22 PM  Result Value Ref Range   Sodium 137 135 - 145 mmol/L  CBG monitoring, ED     Status: Abnormal   Collection Time: 01/03/19  2:59 AM  Result Value Ref Range   Glucose-Capillary 107 (H) 70 - 99 mg/dL  Surgical PCR screen     Status: None   Collection Time: 01/03/19  3:37 AM  Result Value Ref Range   MRSA, PCR NEGATIVE NEGATIVE   Staphylococcus aureus NEGATIVE NEGATIVE  Lipid panel     Status: None   Collection Time: 01/03/19  4:40 AM  Result Value Ref Range   Cholesterol 160 0 - 200 mg/dL   Triglycerides 74 <150 mg/dL   HDL 57 >40 mg/dL   Total CHOL/HDL Ratio 2.8 RATIO   VLDL 15 0 - 40 mg/dL   LDL Cholesterol 88 0 - 99 mg/dL  Hemoglobin A1c     Status: Abnormal   Collection Time: 01/03/19  4:40 AM  Result Value Ref Range   Hgb A1c MFr Bld 5.7 (H) 4.8 - 5.6 %   Mean Plasma Glucose 116.89 mg/dL  Sodium     Status: None   Collection Time: 01/03/19  4:40 AM  Result Value Ref Range   Sodium 140 135 - 145 mmol/L  Sodium     Status: None   Collection Time: 01/03/19  9:46 AM  Result Value Ref Range   Sodium 141 135 - 145 mmol/L   Ct Angio Head W Or Wo Contrast  Result Date: 01/02/2019 CLINICAL DATA:  Initial evaluation for acute lethargy, vertigo. EXAM: CT ANGIOGRAPHY HEAD AND NECK TECHNIQUE: Multidetector CT imaging of the head and neck was performed using the standard protocol during bolus administration of intravenous contrast. Multiplanar CT image reconstructions and MIPs were obtained to evaluate the vascular anatomy. Carotid stenosis measurements (when applicable) are obtained utilizing NASCET criteria, using the distal internal carotid diameter as the denominator. CONTRAST:  34mL ISOVUE-370 IOPAMIDOL (ISOVUE-370)  INJECTION 76% COMPARISON:  None available. FINDINGS: CT HEAD FINDINGS Brain: Cerebral volume within normal limits. Confluent hypodensity involving the right cerebellum, compatible with evolving acute ischemic infarct. Possible more mild involvement within the adjacent left cerebellar hemisphere, although not entirely certain. Evolving small acute bilateral PCA territory infarcts, right greater than left. Evolving acute bilateral thalamic infarcts. Trace hyperdensity at the bilateral occipital poles compatible with associated petechial hemorrhage (series 7, image 58). No frank hemorrhagic transformation. Associated mass effect within the right posterior fossa with partial effacement of the fourth ventricle and basilar cistern crowding. Fourth ventricle remains patent at this time. No obstructive hydrocephalus. Remainder of the brain is normal in appearance. No mass lesion or midline shift. No extra-axial  fluid collection. Vascular: No hyperdense vessel. Skull: Scalp soft tissues and calvarium within normal limits. Sinuses: Paranasal sinuses and mastoids are clear. Orbits: Globes and orbital soft tissues within normal limits. Review of the MIP images confirms the above findings CTA NECK FINDINGS Aortic arch: Partially visualized aortic arch normal in caliber with normal branch pattern. No hemodynamically significant stenosis about the origin of the great vessels. Visualized subclavian arteries patent. Right carotid system: Right common and internal carotid arteries widely patent without stenosis, dissection, or occlusion. Left carotid system: Left common and internal carotid arteries widely patent without stenosis, dissection, or occlusion. Vertebral arteries: Both vertebral arteries arise from the subclavian arteries. Left vertebral artery widely patent and normal in appearance. Right vertebral artery widely patent and normal in appearance throughout its P2 segment. There is multifocal intimal irregularity with  associated moderate stenosis involving the left V3 segment at the level of the C1 transverse foramen (series 10, image 33). Finding compatible with acute arterial dissection. Subtle probable flap noted just distally within the right V3 segment just prior to crossing into the cranial vault (series 12, image 135). Skeleton: No acute osseous abnormality. No discrete lytic or blastic osseous lesions. Pronounced thoracic dextroscoliosis with bilateral Harrington rod fixation partially visualized. Other neck: No other acute soft tissue abnormality within the neck. Upper chest: Partially visualized upper chest demonstrates no acute finding. Review of the MIP images confirms the above findings CTA HEAD FINDINGS Anterior circulation: Internal carotid arteries widely patent to the termini without stenosis. A1 segments, anterior communicating artery, and anterior cerebral arteries widely patent. M1 segments widely patent bilaterally. Normal MCA bifurcations. Distal MCA branches well perfused and symmetric. Posterior circulation: Probable small dissection flap at the right V3/V4 junction as the right vertebral crosses into the cranial vault as above. Right V4 segment otherwise normal in appearance and caliber without acute abnormality. Left vertebral widely patent to the vertebrobasilar junction without stenosis. Patent left PICA. Right PICA not seen, and may be occluded given the acute right cerebellar infarct. Probable short fenestration within the proximal basilar artery noted (series 10, image 273). Basilar patent distally without significant stenosis or other acute finding. Superior cerebral arteries are patent proximally. Both of the posterior cerebral artery supplied via the basilar. PCAs patent proximally. Suspected bilateral distal P3/P4 occlusions (series 12, image 126 on the left, image 140 on the right). Venous sinuses: Patent. Anatomic variants: None significant. Delayed phase: No abnormal enhancement. Review of  the MIP images confirms the above findings IMPRESSION: CT HEAD IMPRESSION 1. 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 Associated trace petechial hemorrhage at the occipital poles without frank hemorrhagic transformation. 2. Associated mass effect within the right posterior fossa with partial effacement of the right basilar cisterns and fourth ventricle. No obstructive hydrocephalus at this time. CTA HEAD AND NECK IMPRESSION 1. Acute right vertebral artery dissection, involving the right V3 segment at the level of the right C1 transverse foramen, with probable small dissection flap just distally at the right V3/V4 junction. Associated moderate stenosis. 2. 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 3. Otherwise negative CTA of the head and neck. Normal left vertebral artery. 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 Critical Value/emergent results were called by telephone at the time of interpretation on 01/02/2019 at 9:40 pm to Dr. Lennice Sites , who verbally acknowledged these results. Electronically Signed   By: Pincus Badder.D.  On: 01/02/2019 21:45   Ct Head Wo Contrast  Result Date: 01/03/2019 CLINICAL DATA:  Stroke follow-up EXAM: CT HEAD WITHOUT CONTRAST TECHNIQUE: Contiguous axial images were obtained from the base of the skull through the vertex without intravenous contrast. COMPARISON:  Head CT 01/02/2019 FINDINGS: Brain: Unchanged appearance of large infarct within the right cerebellar hemisphere with associated narrowing of the fourth ventricle and basal cisterns. Unchanged hypoattenuation within the thalami extending into the cerebral peduncles. Unchanged decreased gray-white differentiation and both occipital lobes. No intraparenchymal hematoma. Possible petechial trace blood products within both occipital lobes are  unchanged. Vascular: No abnormal hyperdensity of the major intracranial arteries or dural venous sinuses. No intracranial atherosclerosis. Skull: The visualized skull base, calvarium and extracranial soft tissues are normal. Sinuses/Orbits: No fluid levels or advanced mucosal thickening of the visualized paranasal sinuses. No mastoid or middle ear effusion. The orbits are normal. IMPRESSION: 1. Unchanged appearance of posterior circulation infarcts involving the right cerebellum, both thalami and both occipital lobes. 2. Unchanged posterior fossa edema with narrowing of the basal cisterns and fourth ventricle without hydrocephalus. Electronically Signed   By: Ulyses Jarred M.D.   On: 01/03/2019 04:40   Ct Angio Neck W And/or Wo Contrast  Result Date: 01/02/2019 CLINICAL DATA:  Initial evaluation for acute lethargy, vertigo. EXAM: CT ANGIOGRAPHY HEAD AND NECK TECHNIQUE: Multidetector CT imaging of the head and neck was performed using the standard protocol during bolus administration of intravenous contrast. Multiplanar CT image reconstructions and MIPs were obtained to evaluate the vascular anatomy. Carotid stenosis measurements (when applicable) are obtained utilizing NASCET criteria, using the distal internal carotid diameter as the denominator. CONTRAST:  62mL ISOVUE-370 IOPAMIDOL (ISOVUE-370) INJECTION 76% COMPARISON:  None available. FINDINGS: CT HEAD FINDINGS Brain: Cerebral volume within normal limits. Confluent hypodensity involving the right cerebellum, compatible with evolving acute ischemic infarct. Possible more mild involvement within the adjacent left cerebellar hemisphere, although not entirely certain. Evolving small acute bilateral PCA territory infarcts, right greater than left. Evolving acute bilateral thalamic infarcts. Trace hyperdensity at the bilateral occipital poles compatible with associated petechial hemorrhage (series 7, image 58). No frank hemorrhagic transformation. Associated mass  effect within the right posterior fossa with partial effacement of the fourth ventricle and basilar cistern crowding. Fourth ventricle remains patent at this time. No obstructive hydrocephalus. Remainder of the brain is normal in appearance. No mass lesion or midline shift. No extra-axial fluid collection. Vascular: No hyperdense vessel. Skull: Scalp soft tissues and calvarium within normal limits. Sinuses: Paranasal sinuses and mastoids are clear. Orbits: Globes and orbital soft tissues within normal limits. Review of the MIP images confirms the above findings CTA NECK FINDINGS Aortic arch: Partially visualized aortic arch normal in caliber with normal branch pattern. No hemodynamically significant stenosis about the origin of the great vessels. Visualized subclavian arteries patent. Right carotid system: Right common and internal carotid arteries widely patent without stenosis, dissection, or occlusion. Left carotid system: Left common and internal carotid arteries widely patent without stenosis, dissection, or occlusion. Vertebral arteries: Both vertebral arteries arise from the subclavian arteries. Left vertebral artery widely patent and normal in appearance. Right vertebral artery widely patent and normal in appearance throughout its P2 segment. There is multifocal intimal irregularity with associated moderate stenosis involving the left V3 segment at the level of the C1 transverse foramen (series 10, image 33). Finding compatible with acute arterial dissection. Subtle probable flap noted just distally within the right V3 segment just prior to crossing into the cranial vault (series 12, image  135). Skeleton: No acute osseous abnormality. No discrete lytic or blastic osseous lesions. Pronounced thoracic dextroscoliosis with bilateral Harrington rod fixation partially visualized. Other neck: No other acute soft tissue abnormality within the neck. Upper chest: Partially visualized upper chest demonstrates no acute  finding. Review of the MIP images confirms the above findings CTA HEAD FINDINGS Anterior circulation: Internal carotid arteries widely patent to the termini without stenosis. A1 segments, anterior communicating artery, and anterior cerebral arteries widely patent. M1 segments widely patent bilaterally. Normal MCA bifurcations. Distal MCA branches well perfused and symmetric. Posterior circulation: Probable small dissection flap at the right V3/V4 junction as the right vertebral crosses into the cranial vault as above. Right V4 segment otherwise normal in appearance and caliber without acute abnormality. Left vertebral widely patent to the vertebrobasilar junction without stenosis. Patent left PICA. Right PICA not seen, and may be occluded given the acute right cerebellar infarct. Probable short fenestration within the proximal basilar artery noted (series 10, image 273). Basilar patent distally without significant stenosis or other acute finding. Superior cerebral arteries are patent proximally. Both of the posterior cerebral artery supplied via the basilar. PCAs patent proximally. Suspected bilateral distal P3/P4 occlusions (series 12, image 126 on the left, image 140 on the right). Venous sinuses: Patent. Anatomic variants: None significant. Delayed phase: No abnormal enhancement. Review of the MIP images confirms the above findings IMPRESSION: CT HEAD IMPRESSION 1. 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 Associated trace petechial hemorrhage at the occipital poles without frank hemorrhagic transformation. 2. Associated mass effect within the right posterior fossa with partial effacement of the right basilar cisterns and fourth ventricle. No obstructive hydrocephalus at this time. CTA HEAD AND NECK IMPRESSION 1. Acute right vertebral artery dissection, involving the right V3 segment at the level of the  right C1 transverse foramen, with probable small dissection flap just distally at the right V3/V4 junction. Associated moderate stenosis. 2. 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 3. Otherwise negative CTA of the head and neck. Normal left vertebral artery. 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 Critical Value/emergent results were called by telephone at the time of interpretation on 01/02/2019 at 9:40 pm to Dr. Lennice Sites , who verbally acknowledged these results. Electronically Signed   By: Jeannine Boga M.D.   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 Does the need for close, 24 hr/day medical supervision in concert with the patient's rehab needs make it unreasonable for this patient to be served in a less intensive setting? Yes 2. Co-Morbidities requiring supervision/potential complications: History of anxiety with panic attacks and attention deficit disorder 3. Due to bladder management, bowel management, safety, skin/wound care, disease management, medication administration, pain management and patient education, does the patient require 24 hr/day rehab nursing? Yes 4. Does the patient require coordinated care of a physician, rehab nurse, PT (1-2 hrs/day, 5 days/week), OT (1-2 hrs/day, 5 days/week) and SLP (.5-1 hrs/day, 5 days/week) to address physical and functional deficits in the context of the above medical diagnosis(es)? Yes Addressing deficits in the following areas: balance, endurance, locomotion, strength, transferring, bowel/bladder control, bathing, dressing, feeding, grooming, toileting, cognition and psychosocial support 5. Can the patient actively participate in an intensive therapy program of at least 3 hrs of therapy per day at least 5 days per week? Yes 6. The  potential for patient to make measurable gains while on  inpatient rehab is excellent 7. Anticipated functional outcomes upon discharge from inpatient rehab are modified independent  with PT, modified independent with OT, modified independent with SLP. 8. Estimated rehab length of stay to reach the above functional goals is: 7-10d 9. Anticipated D/C setting: Home 10. Anticipated post D/C treatments: [place] therapy 11. Overall Rehab/Functional Prognosis: excellent  RECOMMENDATIONS: This patient's condition is appropriate for continued rehabilitative care in the following setting: CIR and Patient will need to be off 3% saline infusion Patient has agreed to participate in recommended program. Potentially Note that insurance prior authorization may be required for reimbursement for recommended care.  Comment: Need to make sure patient's cerebral edema does not worsen off 3% normal saline prior to admission to rehab  "I have personally performed a face to face diagnostic evaluation of this patient.  Additionally, I have reviewed and concur with the physician assistant's documentation above." Charlett Blake M.D. [facility] FAAPM&R (Sports Med, Neuromuscular Med) Diplomate Am Board of Electrodiagnostic Med   Elizabeth Sauer 01/03/2019

## 2019-01-03 NOTE — Progress Notes (Signed)
SLP Cancellation Note  Patient Details Name: FAUN MCQUEEN MRN: [id number] DOB: [birth year]   Cancelled treatment:        ST attempted to see pt for cognitive-linguistic evaluation, however pt is off the unit for an MRI. Will continue efforts.   Jettie Booze, Student SLP    Jettie Booze 01/03/2019, 12:56 PM

## 2019-01-03 NOTE — Progress Notes (Signed)
Occupational Therapy Treatment Patient Details Name: [name] MRN: [id number] DOB: 1981/09/24 Today's Date: 01/03/2019    History of present illness Pt is a 38 y/o female with PMH of ADD, panic attacks, presented to ER for evaluation of episode of panic attack with reports of increased R sided neck pain and headache.  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 Continued workup.    OT comments  Patient asleep in bed, lethargic and spouse assist to arouse.  Session focused on diplopia, patient continues to reports diplopia present in all gaze side by side.  Applied partial occlusion tape to nondominant (R) side of glasses and assessed vision.  Distance and near (reading) patient reports single image, able to read with minimal errors and reaches to hold papers with improved depth perception (limited overshooting).  Educated spouse on use of glasses and provided handout, encouraged use while awake during functional tasks.  Spouse and RN voiced understanding of use.  Will follow.    Follow Up Recommendations  CIR;Supervision/Assistance - 24 hour    Equipment Recommendations  Other (comment)(TBd at next venue of acre)    Recommendations for Other Services Rehab consult    Precautions / Restrictions Precautions Precautions: Fall       Mobility Bed Mobility Overal bed mobility: Needs Assistance Bed Mobility: Rolling Rolling: Supervision         General bed mobility comments: sidelying upon entry, multimodal cueing but able to roll in bed with supervision   Transfers                      Balance                                           ADL either performed or assessed with clinical judgement   ADL                                               Vision   Diplopia Assessment: Disappears with one eye closed;Present all the time/all  directions;Objects split side to side Depth Perception: (with glasses on greatly improved overshooting ) Additional Comments: applied partial occlusion tape to right lens, when donned able see close and distant objects clearly without double image (reading as well)-- educated spouse (and Therapist, sports) on use of glasses, to remove when sleeping or fatigues (headache), to wear during functional tasks    Perception     Praxis      Cognition Arousal/Alertness: Lethargic Behavior During Therapy: Flat affect Overall Cognitive Status: Impaired/Different from baseline Area of Impairment: Attention;Following commands                   Current Attention Level: Sustained   Following Commands: Follows one step commands consistently;Follows one step commands with increased time                Exercises     Shoulder Instructions       General Comments VSS    Pertinent Vitals/ Pain       Pain Assessment: No/denies pain  Home Living  Prior Functioning/Environment              Frequency  Min 3X/week        Progress Toward Goals  OT Goals(current goals can now be found in the care plan section)  Progress towards OT goals: Progressing toward goals  Acute Rehab OT Goals Patient Stated Goal: didn't state Time For Goal Achievement: 01/17/19 Potential to Achieve Goals: Good  Plan Discharge plan remains appropriate;Frequency remains appropriate    Co-evaluation                 AM-PAC OT "6 Clicks" Daily Activity     Outcome Measure   Help from another person eating meals?: A Lot Help from another person taking care of personal grooming?: A Lot Help from another person toileting, which includes using toliet, bedpan, or urinal?: A Lot Help from another person bathing (including washing, rinsing, drying)?: A Lot Help from another person to put on and taking off regular upper body clothing?: A Lot Help from  another person to put on and taking off regular lower body clothing?: A Lot 6 Click Score: 12    End of Session    OT Visit Diagnosis: Other abnormalities of gait and mobility (R26.89);Other symptoms and signs involving the nervous system (R29.898);Other symptoms and signs involving cognitive function;Cognitive communication deficit (R41.841);Dizziness and giddiness (R42);Ataxia, unspecified (R27.0)   Activity Tolerance Patient tolerated treatment well   Patient Left in bed;with call bell/phone within reach;with bed alarm set;with family/visitor present   Nurse Communication Precautions;Other (comment)(glasses for diplopia)        Time: 1417-1430 OT Time Calculation (min): 13 min  Charges: OT General Charges $OT Visit: 1 Visit OT Treatments $Neuromuscular Re-education: 8-22 mins  Delight Stare, OT Acute Rehabilitation Services Pager [phone] Office [phone]    Delight Stare 01/03/2019, 4:50 PM

## 2019-01-03 NOTE — Consult Note (Signed)
Reason for Consult: Cerebellar infarct Referring Physician: Neurology  [name] is an 38 y.o. female.  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  Patient also with evidence of some scattered infarction in her posterior cerebral artery distributions bilaterally.  Patient with a few day history of neck pain.  No history of trauma.  Patient does note some feelings of dizziness.  She denies any headache.  Past Medical History:  Diagnosis Date  . ADD (attention deficit disorder)   . Exercise-induced asthma   . Hx of varicella   . Kidney stones   . S/P cesarean section 05/05/2016  . S/P tubal ligation 05/05/2016  . Term pregnancy, repeat 05/04/2016    Past Surgical History:  Procedure Laterality Date  . BACK SURGERY     scoliosis age 68  . BILATERAL SALPINGECTOMY Bilateral 05/05/2016   Procedure: BILATERAL SALPINGECTOMY;  Surgeon: Janyth Contes, MD;  Location: [place];  Service: Obstetrics;  Laterality: Bilateral;  . CESAREAN SECTION    . CESAREAN SECTION N/A 05/05/2016   Procedure: CESAREAN SECTION;  Surgeon: Janyth Contes, MD;  Location: [place];  Service: Obstetrics;  Laterality: N/A;  . CHOLECYSTECTOMY    . HAND SURGERY    . HERNIA REPAIR    . KIDNEY STONE SURGERY     R--surgery; L lithotripsy.  Dr. Gaynelle Arabian    Family History  Problem Relation Age of Onset  . Cancer Mother   . Cancer Maternal Aunt   . Cancer Maternal Grandmother     Social History:  reports that she has never smoked. She has never used smokeless tobacco. She reports that she does not drink alcohol or use drugs.  Allergies: No Known Allergies  Medications: I have reviewed the patient's current medications.  Results for orders placed or performed during the hospital encounter of 01/02/19 (from the past 48 hour(s))  CBC with Differential     Status: Abnormal   Collection Time: 01/02/19  9:03 AM  Result Value  Ref Range   WBC 13.2 (H) 4.0 - 10.5 K/uL   RBC 4.69 3.87 - 5.11 MIL/uL   Hemoglobin 13.9 12.0 - 15.0 g/dL   HCT 42.4 36.0 - 46.0 %   MCV 90.4 80.0 - 100.0 fL   MCH 29.6 26.0 - 34.0 pg   MCHC 32.8 30.0 - 36.0 g/dL   RDW 13.4 11.5 - 15.5 %   Platelets 295 150 - 400 K/uL   nRBC 0.0 0.0 - 0.2 %   Neutrophils Relative % 80 %   Neutro Abs 10.6 (H) 1.7 - 7.7 K/uL   Lymphocytes Relative 12 %   Lymphs Abs 1.6 0.7 - 4.0 K/uL   Monocytes Relative 6 %   Monocytes Absolute 0.8 0.1 - 1.0 K/uL   Eosinophils Relative 1 %   Eosinophils Absolute 0.1 0.0 - 0.5 K/uL   Basophils Relative 0 %   Basophils Absolute 0.1 0.0 - 0.1 K/uL   Immature Granulocytes 1 %   Abs Immature Granulocytes 0.11 (H) 0.00 - 0.07 K/uL    Comment: Performed at [facility], [place] [address], [place], [place] [address]  Comprehensive metabolic panel     Status: Abnormal   Collection Time: 01/02/19  9:03 AM  Result Value Ref Range   Sodium 137 135 - 145 mmol/L   Potassium 3.3 (L) 3.5 - 5.1 mmol/L   Chloride 108 98 - 111 mmol/L   CO2 20 (L) 22 - 32 mmol/L  Glucose, Bld 179 (H) 70 - 99 mg/dL   BUN 11 6 - 20 mg/dL   Creatinine, Ser 0.63 0.44 - 1.00 mg/dL   Calcium 8.2 (L) 8.9 - 10.3 mg/dL   Total Protein 7.5 6.5 - 8.1 g/dL   Albumin 4.3 3.5 - 5.0 g/dL   AST 29 15 - 41 U/L   ALT 23 0 - 44 U/L   Alkaline Phosphatase 73 38 - 126 U/L   Total Bilirubin 0.1 (L) 0.3 - 1.2 mg/dL   GFR calc non Af Amer >60 >60 mL/min   GFR calc Af Amer >60 >60 mL/min   Anion gap 9 5 - 15    Comment: Performed at [facility], [place] [address], [place], [place] [address]  Salicylate level     Status: None   Collection Time: 01/02/19  9:03 AM  Result Value Ref Range   Salicylate Lvl <5.1 2.8 - 30.0 mg/dL    Comment: Performed at [facility], [place] [address] [place], [place] [address]  Acetaminophen level     Status: Abnormal   Collection Time: 01/02/19  9:03 AM  Result Value Ref Range    Acetaminophen (Tylenol), Serum <10 (L) 10 - 30 ug/mL    Comment: (NOTE) Therapeutic concentrations vary significantly. A range of 10-30 ug/mL  may be an effective concentration for many patients. However, some  are best treated at concentrations outside of this range. Acetaminophen concentrations >150 ug/mL at 4 hours after ingestion  and >50 ug/mL at 12 hours after ingestion are often associated with  toxic reactions. Performed at [facility], [place] [address], [place], [place] [address]   Rapid urine drug screen (hospital performed)     Status: Abnormal   Collection Time: 01/02/19  7:23 PM  Result Value Ref Range   Opiates NONE DETECTED NONE DETECTED   Cocaine NONE DETECTED NONE DETECTED   Benzodiazepines NONE DETECTED NONE DETECTED   Amphetamines POSITIVE (A) NONE DETECTED   Tetrahydrocannabinol NONE DETECTED NONE DETECTED   Barbiturates NONE DETECTED NONE DETECTED    Comment: (NOTE) DRUG SCREEN FOR MEDICAL PURPOSES ONLY.  IF CONFIRMATION IS NEEDED FOR ANY PURPOSE, NOTIFY LAB WITHIN 5 DAYS. LOWEST DETECTABLE LIMITS FOR URINE DRUG SCREEN Drug Class                     Cutoff (ng/mL) Amphetamine and metabolites    1000 Barbiturate and metabolites    200 Benzodiazepine                 423 Tricyclics and metabolites     300 Opiates and metabolites        300 Cocaine and metabolites        300 THC                            50 Performed at [facility], [place] [address], [place], [place] [address]   Ethanol     Status: None   Collection Time: 01/02/19  7:23 PM  Result Value Ref Range   Alcohol, Ethyl (B) <10 <10 mg/dL    Comment: (NOTE) Lowest detectable limit for serum alcohol is 10 mg/dL. For medical purposes only. Performed at [facility], [place] [address], [place],  [address]   Ammonia     Status: None   Collection Time: 01/02/19  7:23 PM  Result Value Ref Range   Ammonia 18  9 - 35 umol/L    Comment:  Performed at [facility], [place] [address] [place], [place] [address]  Urinalysis, Routine w reflex microscopic     Status: Abnormal   Collection Time: 01/02/19  7:23 PM  Result Value Ref Range   Color, Urine YELLOW YELLOW   APPearance HAZY (A) CLEAR   Specific Gravity, Urine 1.014 1.005 - 1.030   pH 6.0 5.0 - 8.0   Glucose, UA 150 (A) NEGATIVE mg/dL   Hgb urine dipstick MODERATE (A) NEGATIVE   Bilirubin Urine NEGATIVE NEGATIVE   Ketones, ur NEGATIVE NEGATIVE mg/dL   Protein, ur NEGATIVE NEGATIVE mg/dL   Nitrite NEGATIVE NEGATIVE   Leukocytes,Ua NEGATIVE NEGATIVE   RBC / HPF 21-50 0 - 5 RBC/hpf   WBC, UA 0-5 0 - 5 WBC/hpf   Bacteria, UA RARE (A) NONE SEEN   Squamous Epithelial / LPF 0-5 0 - 5   Mucus PRESENT     Comment: Performed at [facility], [place] [address] [place], [place] [address]  Lactic acid, plasma     Status: None   Collection Time: 01/02/19  7:24 PM  Result Value Ref Range   Lactic Acid, Venous 1.5 0.5 - 1.9 mmol/L    Comment: Performed at [facility], [place] [address], [place], [place] [address]  TSH     Status: None   Collection Time: 01/02/19 10:22 PM  Result Value Ref Range   TSH 1.321 0.350 - 4.500 uIU/mL    Comment: Performed by a 3rd Generation assay with a functional sensitivity of <=0.01 uIU/mL. Performed at [facility], [place] [address], [place], [place] [address]   T4, free     Status: None   Collection Time: 01/02/19 10:22 PM  Result Value Ref Range   Free T4 0.92 0.82 - 1.77 ng/dL    Comment: (NOTE) Biotin ingestion may interfere with free T4 tests. If the results are inconsistent with the TSH level, previous test results, or the clinical presentation, then consider biotin interference. If needed, order repeat testing after stopping biotin. Performed at [facility], [place] [address], [place], [place] [address]   Sodium     Status: None   Collection Time: 01/02/19  10:22 PM  Result Value Ref Range   Sodium 137 135 - 145 mmol/L    Comment: Performed at [facility], [place] [address], [place], [place] [address]  CBG monitoring, ED     Status: Abnormal   Collection Time: 01/03/19  2:59 AM  Result Value Ref Range   Glucose-Capillary 107 (H) 70 - 99 mg/dL    Ct Angio Head W Or Wo Contrast  Result Date: 01/02/2019 CLINICAL DATA:  Initial evaluation for acute lethargy, vertigo. EXAM: CT ANGIOGRAPHY HEAD AND NECK TECHNIQUE: Multidetector CT imaging of the head and neck was performed using the standard protocol during bolus administration of intravenous contrast. Multiplanar CT image reconstructions and MIPs were obtained to evaluate the vascular anatomy. Carotid stenosis measurements (when applicable) are obtained utilizing NASCET criteria, using the distal internal carotid diameter as the denominator. CONTRAST:  56mL ISOVUE-370 IOPAMIDOL (ISOVUE-370) INJECTION 76% COMPARISON:  None available. FINDINGS: CT HEAD FINDINGS Brain: Cerebral volume within normal limits. Confluent hypodensity involving the right cerebellum, compatible with evolving acute ischemic infarct. Possible more mild involvement within the adjacent left cerebellar hemisphere, although not entirely certain. Evolving small acute bilateral PCA territory infarcts, right greater than left. Evolving acute bilateral thalamic infarcts. Trace hyperdensity  at the bilateral occipital poles compatible with associated petechial hemorrhage (series 7, image 58). No frank hemorrhagic transformation. Associated mass effect within the right posterior fossa with partial effacement of the fourth ventricle and basilar cistern crowding. Fourth ventricle remains patent at this time. No obstructive hydrocephalus. Remainder of the brain is normal in appearance. No mass lesion or midline shift. No extra-axial fluid collection. Vascular: No hyperdense vessel. Skull: Scalp soft tissues and calvarium within normal  limits. Sinuses: Paranasal sinuses and mastoids are clear. Orbits: Globes and orbital soft tissues within normal limits. Review of the MIP images confirms the above findings CTA NECK FINDINGS Aortic arch: Partially visualized aortic arch normal in caliber with normal branch pattern. No hemodynamically significant stenosis about the origin of the great vessels. Visualized subclavian arteries patent. Right carotid system: Right common and internal carotid arteries widely patent without stenosis, dissection, or occlusion. Left carotid system: Left common and internal carotid arteries widely patent without stenosis, dissection, or occlusion. Vertebral arteries: Both vertebral arteries arise from the subclavian arteries. Left vertebral artery widely patent and normal in appearance. Right vertebral artery widely patent and normal in appearance throughout its P2 segment. There is multifocal intimal irregularity with associated moderate stenosis involving the left V3 segment at the level of the C1 transverse foramen (series 10, image 33). Finding compatible with acute arterial dissection. Subtle probable flap noted just distally within the right V3 segment just prior to crossing into the cranial vault (series 12, image 135). Skeleton: No acute osseous abnormality. No discrete lytic or blastic osseous lesions. Pronounced thoracic dextroscoliosis with bilateral Harrington rod fixation partially visualized. Other neck: No other acute soft tissue abnormality within the neck. Upper chest: Partially visualized upper chest demonstrates no acute finding. Review of the MIP images confirms the above findings CTA HEAD FINDINGS Anterior circulation: Internal carotid arteries widely patent to the termini without stenosis. A1 segments, anterior communicating artery, and anterior cerebral arteries widely patent. M1 segments widely patent bilaterally. Normal MCA bifurcations. Distal MCA branches well perfused and symmetric. Posterior  circulation: Probable small dissection flap at the right V3/V4 junction as the right vertebral crosses into the cranial vault as above. Right V4 segment otherwise normal in appearance and caliber without acute abnormality. Left vertebral widely patent to the vertebrobasilar junction without stenosis. Patent left PICA. Right PICA not seen, and may be occluded given the acute right cerebellar infarct. Probable short fenestration within the proximal basilar artery noted (series 10, image 273). Basilar patent distally without significant stenosis or other acute finding. Superior cerebral arteries are patent proximally. Both of the posterior cerebral artery supplied via the basilar. PCAs patent proximally. Suspected bilateral distal P3/P4 occlusions (series 12, image 126 on the left, image 140 on the right). Venous sinuses: Patent. Anatomic variants: None significant. Delayed phase: No abnormal enhancement. Review of the MIP images confirms the above findings IMPRESSION: CT HEAD IMPRESSION 1. 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 Associated trace petechial hemorrhage at the occipital poles without frank hemorrhagic transformation. 2. Associated mass effect within the right posterior fossa with partial effacement of the right basilar cisterns and fourth ventricle. No obstructive hydrocephalus at this time. CTA HEAD AND NECK IMPRESSION 1. Acute right vertebral artery dissection, involving the right V3 segment at the level of the right C1 transverse foramen, with probable small dissection flap just distally at the right V3/V4 junction. Associated moderate stenosis. 2. Probable distal bilateral P3/P4 occlusions as  above, thromboembolic in nature. 3. Otherwise negative CTA of the head and neck. Normal left vertebral artery. 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 Critical Value/emergent  results were called by telephone at the time of interpretation on 01/02/2019 at 9:40 pm to Dr. Lennice Sites , who verbally acknowledged these results. Electronically Signed   By: Jeannine Boga M.D.   On: 01/02/2019 21:45   Ct Angio Neck W And/or Wo Contrast  Result Date: 01/02/2019 CLINICAL DATA:  Initial evaluation for acute lethargy, vertigo. EXAM: CT ANGIOGRAPHY HEAD AND NECK TECHNIQUE: Multidetector CT imaging of the head and neck was performed using the standard protocol during bolus administration of intravenous contrast. Multiplanar CT image reconstructions and MIPs were obtained to evaluate the vascular anatomy. Carotid stenosis measurements (when applicable) are obtained utilizing NASCET criteria, using the distal internal carotid diameter as the denominator. CONTRAST:  44mL ISOVUE-370 IOPAMIDOL (ISOVUE-370) INJECTION 76% COMPARISON:  None available. FINDINGS: CT HEAD FINDINGS Brain: Cerebral volume within normal limits. Confluent hypodensity involving the right cerebellum, compatible with evolving acute ischemic infarct. Possible more mild involvement within the adjacent left cerebellar hemisphere, although not entirely certain. Evolving small acute bilateral PCA territory infarcts, right greater than left. Evolving acute bilateral thalamic infarcts. Trace hyperdensity at the bilateral occipital poles compatible with associated petechial hemorrhage (series 7, image 58). No frank hemorrhagic transformation. Associated mass effect within the right posterior fossa with partial effacement of the fourth ventricle and basilar cistern crowding. Fourth ventricle remains patent at this time. No obstructive hydrocephalus. Remainder of the brain is normal in appearance. No mass lesion or midline shift. No extra-axial fluid collection. Vascular: No hyperdense vessel. Skull: Scalp soft tissues and calvarium within normal limits. Sinuses: Paranasal sinuses and mastoids are clear. Orbits: Globes and orbital  soft tissues within normal limits. Review of the MIP images confirms the above findings CTA NECK FINDINGS Aortic arch: Partially visualized aortic arch normal in caliber with normal branch pattern. No hemodynamically significant stenosis about the origin of the great vessels. Visualized subclavian arteries patent. Right carotid system: Right common and internal carotid arteries widely patent without stenosis, dissection, or occlusion. Left carotid system: Left common and internal carotid arteries widely patent without stenosis, dissection, or occlusion. Vertebral arteries: Both vertebral arteries arise from the subclavian arteries. Left vertebral artery widely patent and normal in appearance. Right vertebral artery widely patent and normal in appearance throughout its P2 segment. There is multifocal intimal irregularity with associated moderate stenosis involving the left V3 segment at the level of the C1 transverse foramen (series 10, image 33). Finding compatible with acute arterial dissection. Subtle probable flap noted just distally within the right V3 segment just prior to crossing into the cranial vault (series 12, image 135). Skeleton: No acute osseous abnormality. No discrete lytic or blastic osseous lesions. Pronounced thoracic dextroscoliosis with bilateral Harrington rod fixation partially visualized. Other neck: No other acute soft tissue abnormality within the neck. Upper chest: Partially visualized upper chest demonstrates no acute finding. Review of the MIP images confirms the above findings CTA HEAD FINDINGS Anterior circulation: Internal carotid arteries widely patent to the termini without stenosis. A1 segments, anterior communicating artery, and anterior cerebral arteries widely patent. M1 segments widely patent bilaterally. Normal MCA bifurcations. Distal MCA branches well perfused and symmetric. Posterior circulation: Probable small dissection flap at the right V3/V4 junction as the right  vertebral crosses into the cranial vault as above. Right V4 segment otherwise normal in appearance and caliber without acute abnormality. Left vertebral widely  patent to the vertebrobasilar junction without stenosis. Patent left PICA. Right PICA not seen, and may be occluded given the acute right cerebellar infarct. Probable short fenestration within the proximal basilar artery noted (series 10, image 273). Basilar patent distally without significant stenosis or other acute finding. Superior cerebral arteries are patent proximally. Both of the posterior cerebral artery supplied via the basilar. PCAs patent proximally. Suspected bilateral distal P3/P4 occlusions (series 12, image 126 on the left, image 140 on the right). Venous sinuses: Patent. Anatomic variants: None significant. Delayed phase: No abnormal enhancement. Review of the MIP images confirms the above findings IMPRESSION: CT HEAD IMPRESSION 1. 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 Associated trace petechial hemorrhage at the occipital poles without frank hemorrhagic transformation. 2. Associated mass effect within the right posterior fossa with partial effacement of the right basilar cisterns and fourth ventricle. No obstructive hydrocephalus at this time. CTA HEAD AND NECK IMPRESSION 1. Acute right vertebral artery dissection, involving the right V3 segment at the level of the right C1 transverse foramen, with probable small dissection flap just distally at the right V3/V4 junction. Associated moderate stenosis. 2. 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 3. Otherwise negative CTA of the head and neck. Normal left vertebral artery. 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 Critical Value/emergent results were called by telephone at the time of interpretation on 01/02/2019 at 9:40  pm to Dr. Lennice Sites , who verbally acknowledged these results. Electronically Signed   By: Jeannine Boga M.D.   On: 01/02/2019 21:45    Pertinent items noted in HPI and remainder of comprehensive ROS otherwise negative. Blood pressure 131/87, pulse 69, temperature 98.5 F (36.9 C), temperature source Oral, resp. rate (!) 21, height 5\' 4"  (1.626 m), weight 86.2 kg, last menstrual period 12/17/2018, SpO2 95 %. The patient is mildly somnolent but awakens easily to voice.  She is aware and oriented.  Her speech is reasonably fluent.  Her pupils are 3 mm and reactive bilaterally.  Her gaze is conjugate.  Her extraocular movements appear full.  She has some mild right-sided facial weakness.  Her tongue protrudes to the midline.  Motor examination of her extremities is 5/5 bilaterally without pronator drift.  Sensory exam nonfocal.  Examination head ears eyes nose throat demonstrates no evidence of trauma or injury.  Oropharynx, nasopharynx and external auditory canals clear.  Chest and abdomen benign.  Extremities free from injury or deformity.  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  Patient also with evidence of scattered probable early infarct in her posterior cerebral artery distributions.  Currently the patient's neurologic status is quite good.  Her follow-up scan which is just done demonstrates the known infarct.  Her fourth ventricle remains patent.  There does not appear to be any significant compression of the brainstem itself.  Her foramen magnum is open.  There is no evidence of hydrocephalus.  At this point I do not think there is any indication for decompressive surgery and/or ventriculostomy placement.  I recommend continued close neurologic observation.  I will follow along closely.  I agree with 3% saline for now.  I would recommend aspirin therapy for her arterial injury.  Mallie Mussel A Delana Manganello 01/03/2019, 4:30 AM

## 2019-01-03 NOTE — Progress Notes (Signed)
STROKE TEAM PROGRESS NOTE   INTERVAL HISTORY [name] dad [name] is at the bedside.  [name] is up in the chair. [name] states [name] neck pain started yesterday (prior notes said it had been days to weeks). Denies trauma.   Vitals:   01/03/19 0400 01/03/19 0500 01/03/19 0600 01/03/19 0700  BP: 134/72 132/83 (!) 145/77 130/89  Pulse: (!) 51 (!) 57 61 (!) 52  Resp: 12 12 (!) 21 18  Temp:      TempSrc:      SpO2: 94% 95% 97% 98%  Weight:      Height:        CBC:  Recent Labs  Lab 01/02/19 0903  WBC 13.2*  NEUTROABS 10.6*  HGB 13.9  HCT 42.4  MCV 90.4  PLT 546    Basic Metabolic Panel:  Recent Labs  Lab 01/02/19 0903 01/02/19 2222 01/03/19 0440  NA 137 137 140  K 3.3*  --   --   CL 108  --   --   CO2 20*  --   --   GLUCOSE 179*  --   --   BUN 11  --   --   CREATININE 0.63  --   --   CALCIUM 8.2*  --   --    Lipid Panel:     Component Value Date/Time   CHOL 160 01/03/2019 0440   TRIG 74 01/03/2019 0440   HDL 57 01/03/2019 0440   CHOLHDL 2.8 01/03/2019 0440   VLDL 15 01/03/2019 0440   LDLCALC 88 01/03/2019 0440   HgbA1c:  Lab Results  Component Value Date   HGBA1C 5.7 (H) 01/03/2019   Urine Drug Screen:     Component Value Date/Time   LABOPIA NONE DETECTED 01/02/2019 1923   COCAINSCRNUR NONE DETECTED 01/02/2019 1923   LABBENZ NONE DETECTED 01/02/2019 1923   AMPHETMU POSITIVE (A) 01/02/2019 1923   THCU NONE DETECTED 01/02/2019 1923   LABBARB NONE DETECTED 01/02/2019 1923    Alcohol Level     Component Value Date/Time   ETH <10 01/02/2019 1923    IMAGING Ct Angio Head W Or Wo Contrast  Result Date: 01/02/2019 CLINICAL DATA:  Initial evaluation for acute lethargy, vertigo. EXAM: CT ANGIOGRAPHY HEAD AND NECK TECHNIQUE: Multidetector CT imaging of the head and neck was performed using the standard protocol during bolus administration of intravenous contrast. Multiplanar CT image reconstructions and MIPs were obtained to evaluate the vascular anatomy. Carotid  stenosis measurements (when applicable) are obtained utilizing NASCET criteria, using the distal internal carotid diameter as the denominator. CONTRAST:  33mL ISOVUE-370 IOPAMIDOL (ISOVUE-370) INJECTION 76% COMPARISON:  None available. FINDINGS: CT HEAD FINDINGS Brain: Cerebral volume within normal limits. Confluent hypodensity involving the right cerebellum, compatible with evolving acute ischemic infarct. Possible more mild involvement within the adjacent left cerebellar hemisphere, although not entirely certain. Evolving small acute bilateral PCA territory infarcts, right greater than left. Evolving acute bilateral thalamic infarcts. Trace hyperdensity at the bilateral occipital poles compatible with associated petechial hemorrhage (series 7, image 58). No frank hemorrhagic transformation. Associated mass effect within the right posterior fossa with partial effacement of the fourth ventricle and basilar cistern crowding. Fourth ventricle remains patent at this time. No obstructive hydrocephalus. Remainder of the brain is normal in appearance. No mass lesion or midline shift. No extra-axial fluid collection. Vascular: No hyperdense vessel. Skull: Scalp soft tissues and calvarium within normal limits. Sinuses: Paranasal sinuses and mastoids are clear. Orbits: Globes and orbital soft tissues within normal limits. Review  of the MIP images confirms the above findings CTA NECK FINDINGS Aortic arch: Partially visualized aortic arch normal in caliber with normal branch pattern. No hemodynamically significant stenosis about the origin of the great vessels. Visualized subclavian arteries patent. Right carotid system: Right common and internal carotid arteries widely patent without stenosis, dissection, or occlusion. Left carotid system: Left common and internal carotid arteries widely patent without stenosis, dissection, or occlusion. Vertebral arteries: Both vertebral arteries arise from the subclavian arteries. Left  vertebral artery widely patent and normal in appearance. Right vertebral artery widely patent and normal in appearance throughout its P2 segment. There is multifocal intimal irregularity with associated moderate stenosis involving the left V3 segment at the level of the C1 transverse foramen (series 10, image 33). Finding compatible with acute arterial dissection. Subtle probable flap noted just distally within the right V3 segment just prior to crossing into the cranial vault (series 12, image 135). Skeleton: No acute osseous abnormality. No discrete lytic or blastic osseous lesions. Pronounced thoracic dextroscoliosis with bilateral Harrington rod fixation partially visualized. Other neck: No other acute soft tissue abnormality within the neck. Upper chest: Partially visualized upper chest demonstrates no acute finding. Review of the MIP images confirms the above findings CTA HEAD FINDINGS Anterior circulation: Internal carotid arteries widely patent to the termini without stenosis. A1 segments, anterior communicating artery, and anterior cerebral arteries widely patent. M1 segments widely patent bilaterally. Normal MCA bifurcations. Distal MCA branches well perfused and symmetric. Posterior circulation: Probable small dissection flap at the right V3/V4 junction as the right vertebral crosses into the cranial vault as above. Right V4 segment otherwise normal in appearance and caliber without acute abnormality. Left vertebral widely patent to the vertebrobasilar junction without stenosis. Patent left PICA. Right PICA not seen, and may be occluded given the acute right cerebellar infarct. Probable short fenestration within the proximal basilar artery noted (series 10, image 273). Basilar patent distally without significant stenosis or other acute finding. Superior cerebral arteries are patent proximally. Both of the posterior cerebral artery supplied via the basilar. PCAs patent proximally. Suspected bilateral  distal P3/P4 occlusions (series 12, image 126 on the left, image 140 on the right). Venous sinuses: Patent. Anatomic variants: None significant. Delayed phase: No abnormal enhancement. Review of the MIP images confirms the above findings IMPRESSION: CT HEAD IMPRESSION 1. 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 Associated trace petechial hemorrhage at the occipital poles without frank hemorrhagic transformation. 2. Associated mass effect within the right posterior fossa with partial effacement of the right basilar cisterns and fourth ventricle. No obstructive hydrocephalus at this time. CTA HEAD AND NECK IMPRESSION 1. Acute right vertebral artery dissection, involving the right V3 segment at the level of the right C1 transverse foramen, with probable small dissection flap just distally at the right V3/V4 junction. Associated moderate stenosis. 2. 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 3. Otherwise negative CTA of the head and neck. Normal left vertebral artery. 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 Critical Value/emergent results were called by telephone at the time of interpretation on 01/02/2019 at 9:40 pm to Dr. Lennice Sites , who verbally acknowledged these results. Electronically Signed   By: Jeannine Boga M.D.   On: 01/02/2019 21:45   Ct Head Wo Contrast  Result Date: 01/03/2019 CLINICAL DATA:  Stroke follow-up EXAM: CT HEAD WITHOUT CONTRAST TECHNIQUE: Contiguous axial images were obtained from the  base of the skull through the vertex without intravenous contrast. COMPARISON:  Head CT 01/02/2019 FINDINGS: Brain: Unchanged appearance of large infarct within the right cerebellar hemisphere with associated narrowing of the fourth ventricle and basal cisterns. Unchanged hypoattenuation within the thalami extending into the  cerebral peduncles. Unchanged decreased gray-white differentiation and both occipital lobes. No intraparenchymal hematoma. Possible petechial trace blood products within both occipital lobes are unchanged. Vascular: No abnormal hyperdensity of the major intracranial arteries or dural venous sinuses. No intracranial atherosclerosis. Skull: The visualized skull base, calvarium and extracranial soft tissues are normal. Sinuses/Orbits: No fluid levels or advanced mucosal thickening of the visualized paranasal sinuses. No mastoid or middle ear effusion. The orbits are normal. IMPRESSION: 1. Unchanged appearance of posterior circulation infarcts involving the right cerebellum, both thalami and both occipital lobes. 2. Unchanged posterior fossa edema with narrowing of the basal cisterns and fourth ventricle without hydrocephalus. Electronically Signed   By: Ulyses Jarred M.D.   On: 01/03/2019 04:40   Ct Angio Neck W And/or Wo Contrast  Result Date: 01/02/2019 CLINICAL DATA:  Initial evaluation for acute lethargy, vertigo. EXAM: CT ANGIOGRAPHY HEAD AND NECK TECHNIQUE: Multidetector CT imaging of the head and neck was performed using the standard protocol during bolus administration of intravenous contrast. Multiplanar CT image reconstructions and MIPs were obtained to evaluate the vascular anatomy. Carotid stenosis measurements (when applicable) are obtained utilizing NASCET criteria, using the distal internal carotid diameter as the denominator. CONTRAST:  13mL ISOVUE-370 IOPAMIDOL (ISOVUE-370) INJECTION 76% COMPARISON:  None available. FINDINGS: CT HEAD FINDINGS Brain: Cerebral volume within normal limits. Confluent hypodensity involving the right cerebellum, compatible with evolving acute ischemic infarct. Possible more mild involvement within the adjacent left cerebellar hemisphere, although not entirely certain. Evolving small acute bilateral PCA territory infarcts, right greater than left. Evolving acute bilateral  thalamic infarcts. Trace hyperdensity at the bilateral occipital poles compatible with associated petechial hemorrhage (series 7, image 58). No frank hemorrhagic transformation. Associated mass effect within the right posterior fossa with partial effacement of the fourth ventricle and basilar cistern crowding. Fourth ventricle remains patent at this time. No obstructive hydrocephalus. Remainder of the brain is normal in appearance. No mass lesion or midline shift. No extra-axial fluid collection. Vascular: No hyperdense vessel. Skull: Scalp soft tissues and calvarium within normal limits. Sinuses: Paranasal sinuses and mastoids are clear. Orbits: Globes and orbital soft tissues within normal limits. Review of the MIP images confirms the above findings CTA NECK FINDINGS Aortic arch: Partially visualized aortic arch normal in caliber with normal branch pattern. No hemodynamically significant stenosis about the origin of the great vessels. Visualized subclavian arteries patent. Right carotid system: Right common and internal carotid arteries widely patent without stenosis, dissection, or occlusion. Left carotid system: Left common and internal carotid arteries widely patent without stenosis, dissection, or occlusion. Vertebral arteries: Both vertebral arteries arise from the subclavian arteries. Left vertebral artery widely patent and normal in appearance. Right vertebral artery widely patent and normal in appearance throughout its P2 segment. There is multifocal intimal irregularity with associated moderate stenosis involving the left V3 segment at the level of the C1 transverse foramen (series 10, image 33). Finding compatible with acute arterial dissection. Subtle probable flap noted just distally within the right V3 segment just prior to crossing into the cranial vault (series 12, image 135). Skeleton: No acute osseous abnormality. No discrete lytic or blastic osseous lesions. Pronounced thoracic dextroscoliosis  with bilateral Harrington rod fixation partially visualized. Other neck: No other acute soft tissue  abnormality within the neck. Upper chest: Partially visualized upper chest demonstrates no acute finding. Review of the MIP images confirms the above findings CTA HEAD FINDINGS Anterior circulation: Internal carotid arteries widely patent to the termini without stenosis. A1 segments, anterior communicating artery, and anterior cerebral arteries widely patent. M1 segments widely patent bilaterally. Normal MCA bifurcations. Distal MCA branches well perfused and symmetric. Posterior circulation: Probable small dissection flap at the right V3/V4 junction as the right vertebral crosses into the cranial vault as above. Right V4 segment otherwise normal in appearance and caliber without acute abnormality. Left vertebral widely patent to the vertebrobasilar junction without stenosis. Patent left PICA. Right PICA not seen, and may be occluded given the acute right cerebellar infarct. Probable short fenestration within the proximal basilar artery noted (series 10, image 273). Basilar patent distally without significant stenosis or other acute finding. Superior cerebral arteries are patent proximally. Both of the posterior cerebral artery supplied via the basilar. PCAs patent proximally. Suspected bilateral distal P3/P4 occlusions (series 12, image 126 on the left, image 140 on the right). Venous sinuses: Patent. Anatomic variants: None significant. Delayed phase: No abnormal enhancement. Review of the MIP images confirms the above findings IMPRESSION: CT HEAD IMPRESSION 1. 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 Associated trace petechial hemorrhage at the occipital poles without frank hemorrhagic transformation. 2. Associated mass effect within the right posterior fossa with partial effacement of the right basilar  cisterns and fourth ventricle. No obstructive hydrocephalus at this time. CTA HEAD AND NECK IMPRESSION 1. Acute right vertebral artery dissection, involving the right V3 segment at the level of the right C1 transverse foramen, with probable small dissection flap just distally at the right V3/V4 junction. Associated moderate stenosis. 2. 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 3. Otherwise negative CTA of the head and neck. Normal left vertebral artery. 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 Critical Value/emergent results were called by telephone at the time of interpretation on 01/02/2019 at 9:40 pm to Dr. Lennice Sites , who verbally acknowledged these results. Electronically Signed   By: Jeannine Boga M.D.   On: 01/02/2019 21:45    PHYSICAL EXAM General: Patient is very drowsy, opens eyes to voice but keeps falling asleep during this encounter. HEENT: Normocephalic atraumatic dry mucous membranes Lungs: Clear to auscultation CVS: S1-2 heard regular rhythm Abdomen: Soft nondistended nontender Extremities: Warm well perfused intact pulses Neurological exam [name] is very drowsy, opens eyes to voice but keeps falling asleep during the encounter.  [name] is able to name objects reliably.  [name] speech is non-dysarthric.  Comprehension is intact. Repetition is intact.  Follows all commands appropriately, able to maintain attention when stimualted Cranial nerves: Pupils are equal round reactive to light, extraocular movements are intact, visual fields are full to threat, face is symmetric, auditory acuity intact, palate midline, tongue midline. Motor exam: No vertical drift in any of the extremities although the right upper extremity appeared to be drifting up on exam.  Individual muscle testing did not reveal any weakness in the upper lower extremity. Sensory exam: Intact to light touch all over Coordination: mild on ataxia in RUE Gait  testing was deferred at this time   ASSESSMENT/PLAN [name] is a 38 y.o. female with history of ADD, panic attachs presenting with anxiety and ataxia. Found to have R sided neck pain, HA. 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 Mass effect R post fossa w/ partial effacement R basilar cisterns and 4th ventricle  CTA head & neck R VA dissction R V3 w/ flap distal R V3/4. probabl distal B P3/4 occlusions  F/u CT unchanged post circ infarcts. Unchanged edema w/o hydro  MRI  pending   2D Echo  pending   LDL 88  HgbA1c 5.7  SCDs for VTE prophylaxis  Passed swallow, add diet  No antithrombotic prior to admission, now on No antithrombotic. Given dissection, added aspirin 325 mg daily  Therapy recommendations:  pending   Disposition:  pending   Continue ICU level care  Cerebral Edema Induced Hypernatremia  On 3% at 75h via PIV  Goal Na 150-155  Na now 140  Check Na q 6h  Keep 3% x 24h from now via PIV, will not plan PICC  Blood Pressure  Stable . No hx HTN . Permissive hypertension (OK if < 220/120) but gradually normalize in 5-7 days . Long-term BP goal normotensive  Other Stroke Risk Factors  Obesity, Body mass index is 32.61 kg/m., recommend weight loss, diet and exercise as appropriate   Other Active Problems  ADD  Panic attacks  Hospital day # 1  Burnetta Sabin, MSN, APRN, ANVP-BC, AGPCNP-BC Advanced Practice Stroke Nurse [place] for Schedule & Pager information 01/03/2019 10:17 AM  Will have MRI brain today Start ASA 325mg  daily for Vert dissection Nsurg on board, watching for edema and possible need for Ventric/Suboccipital  crani Continue on 3%  To contact Stroke Continuity provider, please refer to [email or web address] After hours, contact General Neurology

## 2019-01-04 LAB — SODIUM
Sodium: 141 mmol/L (ref 135–145)
Sodium: 140 mmol/L (ref 135–145)
Sodium: 140 mmol/L (ref 135–145)

## 2019-01-04 MED ORDER — SERTRALINE HCL 50 MG PO TABS
100.0000 mg | ORAL_TABLET | Freq: Every day | ORAL | Status: DC
  Administered 2019-01-04 – 2019-01-05 (×2): 100 mg via ORAL
  Filled 2019-01-04 (×2): qty 2

## 2019-01-04 NOTE — Plan of Care (Signed)
  Problem: Self-Care: Goal: Verbalization of feelings and concerns over difficulty with self-care will improve Outcome: Progressing   

## 2019-01-04 NOTE — Progress Notes (Signed)
Physical Therapy Treatment Patient Details Name: [name] MRN: [id number] DOB: 1981-09-06 Today's Date: 01/04/2019    History of Present Illness Pt is a 38 y/o female with PMH of ADD, panic attacks, presented to ER for evaluation of episode of panic attack with reports of increased R sided neck pain and headache.  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 Continued workup.     PT Comments    Pt admitted with above diagnosis. Pt currently with functional limitations due to balance and endurance deficits. Pt was able to ambulate on unit with bil HHA with +2 mod assist due to gait instability with chair follow.  Pt still confused at times but is able to express needs as she asked for toileting and drink.   Husband attentive. Pt is progressing.  Pt will benefit from skilled PT to increase their independence and safety with mobility to allow discharge to the venue listed below.     Follow Up Recommendations  CIR     Equipment Recommendations  Other (comment)(TBD)    Recommendations for Other Services Rehab consult     Precautions / Restrictions Precautions Precautions: Fall Restrictions Weight Bearing Restrictions: No    Mobility  Bed Mobility Overal bed mobility: Needs Assistance Bed Mobility: Rolling Rolling: Supervision   Supine to sit: Min assist;+2 for safety/equipment;HOB elevated     General bed mobility comments: sidelying upon entry, multimodal cueing but able to roll in bed with supervision .  Needed cues to slow down due to impulsivitiy.   Transfers Overall transfer level: Needs assistance Equipment used: 2 person hand held assist Transfers: Sit to/from Stand Sit to Stand: Min assist;+2 safety/equipment;+2 physical assistance         General transfer comment: min assist to power up and steady with +2, B UE hand support  required  Ambulation/Gait Ambulation/Gait  assistance: Mod assist;+2 physical assistance;+2 safety/equipment Gait Distance (Feet): 250 Feet(30 feet, sitting rest, then 220 feet) Assistive device: 2 person hand held assist Gait Pattern/deviations: Step-through pattern;Ataxic;Narrow base of support Gait velocity: decreased Gait velocity interpretation: <1.8 ft/sec, indicate of risk for recurrent falls General Gait Details: pt with significantly impaired R LE co-ordination, motor planning and sequencing. Pt with ataxic, occasionaly scissoring stepping pattern, pt reports dizziness most likely due to cerebellar involvement.  Pt wore the occluded glasses which seemed to help balance some. Still reports double vision.     Stairs             Wheelchair Mobility    Modified Rankin (Stroke Patients Only) Modified Rankin (Stroke Patients Only) Pre-Morbid Rankin Score: No symptoms Modified Rankin: Moderately severe disability     Balance Overall balance assessment: Needs assistance Sitting-balance support: No upper extremity supported;Feet supported Sitting balance-Leahy Scale: Fair Sitting balance - Comments: min guard for safety dynamically    Standing balance support: Bilateral upper extremity supported;During functional activity(washing hands at sink) Standing balance-Leahy Scale: Poor Standing balance comment: reliant on UE and external support                            Cognition   Behavior During Therapy: Flat affect Overall Cognitive Status: Impaired/Different from baseline Area of Impairment: Attention;Following commands                 Orientation Level: Time;Situation Current Attention Level: Sustained Memory: Decreased recall of precautions;Decreased short-term memory Following Commands: Follows one step  commands consistently;Follows one step commands with increased time Safety/Judgement: Decreased awareness of safety;Decreased awareness of deficits Awareness: Emergent Problem Solving: Slow  processing;Decreased initiation;Difficulty sequencing;Requires verbal cues;Requires tactile cues General Comments: pt with very impaired co-ordination, delayed processing       Exercises      General Comments General comments (skin integrity, edema, etc.): VSS      Pertinent Vitals/Pain Faces Pain Scale: No hurt    Home Living                      Prior Function            PT Goals (current goals can now be found in the care plan section) Acute Rehab PT Goals Patient Stated Goal: didn't state Progress towards PT goals: Progressing toward goals    Frequency    Min 4X/week      PT Plan Current plan remains appropriate    Co-evaluation              AM-PAC PT "6 Clicks" Mobility   Outcome Measure  Help needed turning from your back to your side while in a flat bed without using bedrails?: A Little Help needed moving from lying on your back to sitting on the side of a flat bed without using bedrails?: A Little Help needed moving to and from a bed to a chair (including a wheelchair)?: A Lot Help needed standing up from a chair using your arms (e.g., wheelchair or bedside chair)?: A Lot Help needed to walk in hospital room?: A Lot Help needed climbing 3-5 steps with a railing? : A Lot 6 Click Score: 14    End of Session Equipment Utilized During Treatment: Gait belt Activity Tolerance: Patient tolerated treatment well Patient left: in chair;with call bell/phone within reach;with chair alarm set;with family/visitor present Nurse Communication: Mobility status PT Visit Diagnosis: Unsteadiness on feet (R26.81);Ataxic gait (R26.0);Hemiplegia and hemiparesis Hemiplegia - Right/Left: Right Hemiplegia - dominant/non-dominant: Dominant Hemiplegia - caused by: Other cerebrovascular disease(R vertebral artery dissection)     Time: 7517-0017 PT Time Calculation (min) (ACUTE ONLY): 26 min  Charges:  $Gait Training: 23-37 mins                     [place] Pager:  [phone]  Office:  [place] 01/04/2019, 2:32 PM

## 2019-01-04 NOTE — Progress Notes (Signed)
STROKE TEAM PROGRESS NOTE   INTERVAL HISTORY Husband at bedside. Pt sleeping in bed tbut easily awakens. Still with double vision. Tolerated ASA well  Vitals:   01/04/19 0600 01/04/19 0636 01/04/19 0700 01/04/19 0800  BP:  (!) 161/92 (!) 144/93   Pulse: (!) 54 (!) 51 (!) 51   Resp: 12 16 13    Temp:    98.6 F (37 C)  TempSrc:    Oral  SpO2: 100% 98% 99%   Weight:      Height:        CBC:  Recent Labs  Lab 01/02/19 0903  WBC 13.2*  NEUTROABS 10.6*  HGB 13.9  HCT 42.4  MCV 90.4  PLT 419    Basic Metabolic Panel:  Recent Labs  Lab 01/02/19 0903  01/03/19 2255 01/04/19 0408  NA 137   < > 141 140  K 3.3*  --   --   --   CL 108  --   --   --   CO2 20*  --   --   --   GLUCOSE 179*  --   --   --   BUN 11  --   --   --   CREATININE 0.63  --   --   --   CALCIUM 8.2*  --   --   --    < > = values in this interval not displayed.   Lipid Panel:     Component Value Date/Time   CHOL 160 01/03/2019 0440   TRIG 74 01/03/2019 0440   HDL 57 01/03/2019 0440   CHOLHDL 2.8 01/03/2019 0440   VLDL 15 01/03/2019 0440   LDLCALC 88 01/03/2019 0440   HgbA1c:  Lab Results  Component Value Date   HGBA1C 5.7 (H) 01/03/2019   Urine Drug Screen:     Component Value Date/Time   LABOPIA NONE DETECTED 01/02/2019 1923   COCAINSCRNUR NONE DETECTED 01/02/2019 1923   LABBENZ NONE DETECTED 01/02/2019 1923   AMPHETMU POSITIVE (A) 01/02/2019 1923   THCU NONE DETECTED 01/02/2019 1923   LABBARB NONE DETECTED 01/02/2019 1923    Alcohol Level     Component Value Date/Time   ETH <10 01/02/2019 1923    IMAGING Ct Angio Head W Or Wo Contrast  Result Date: 01/02/2019 CLINICAL DATA:  Initial evaluation for acute lethargy, vertigo. EXAM: CT ANGIOGRAPHY HEAD AND NECK TECHNIQUE: Multidetector CT imaging of the head and neck was performed using the standard protocol during bolus administration of intravenous contrast. Multiplanar CT image reconstructions and MIPs were obtained to evaluate the  vascular anatomy. Carotid stenosis measurements (when applicable) are obtained utilizing NASCET criteria, using the distal internal carotid diameter as the denominator. CONTRAST:  84mL ISOVUE-370 IOPAMIDOL (ISOVUE-370) INJECTION 76% COMPARISON:  None available. FINDINGS: CT HEAD FINDINGS Brain: Cerebral volume within normal limits. Confluent hypodensity involving the right cerebellum, compatible with evolving acute ischemic infarct. Possible more mild involvement within the adjacent left cerebellar hemisphere, although not entirely certain. Evolving small acute bilateral PCA territory infarcts, right greater than left. Evolving acute bilateral thalamic infarcts. Trace hyperdensity at the bilateral occipital poles compatible with associated petechial hemorrhage (series 7, image 58). No frank hemorrhagic transformation. Associated mass effect within the right posterior fossa with partial effacement of the fourth ventricle and basilar cistern crowding. Fourth ventricle remains patent at this time. No obstructive hydrocephalus. Remainder of the brain is normal in appearance. No mass lesion or midline shift. No extra-axial fluid collection. Vascular: No hyperdense vessel. Skull: Scalp  soft tissues and calvarium within normal limits. Sinuses: Paranasal sinuses and mastoids are clear. Orbits: Globes and orbital soft tissues within normal limits. Review of the MIP images confirms the above findings CTA NECK FINDINGS Aortic arch: Partially visualized aortic arch normal in caliber with normal branch pattern. No hemodynamically significant stenosis about the origin of the great vessels. Visualized subclavian arteries patent. Right carotid system: Right common and internal carotid arteries widely patent without stenosis, dissection, or occlusion. Left carotid system: Left common and internal carotid arteries widely patent without stenosis, dissection, or occlusion. Vertebral arteries: Both vertebral arteries arise from the  subclavian arteries. Left vertebral artery widely patent and normal in appearance. Right vertebral artery widely patent and normal in appearance throughout its P2 segment. There is multifocal intimal irregularity with associated moderate stenosis involving the left V3 segment at the level of the C1 transverse foramen (series 10, image 33). Finding compatible with acute arterial dissection. Subtle probable flap noted just distally within the right V3 segment just prior to crossing into the cranial vault (series 12, image 135). Skeleton: No acute osseous abnormality. No discrete lytic or blastic osseous lesions. Pronounced thoracic dextroscoliosis with bilateral Harrington rod fixation partially visualized. Other neck: No other acute soft tissue abnormality within the neck. Upper chest: Partially visualized upper chest demonstrates no acute finding. Review of the MIP images confirms the above findings CTA HEAD FINDINGS Anterior circulation: Internal carotid arteries widely patent to the termini without stenosis. A1 segments, anterior communicating artery, and anterior cerebral arteries widely patent. M1 segments widely patent bilaterally. Normal MCA bifurcations. Distal MCA branches well perfused and symmetric. Posterior circulation: Probable small dissection flap at the right V3/V4 junction as the right vertebral crosses into the cranial vault as above. Right V4 segment otherwise normal in appearance and caliber without acute abnormality. Left vertebral widely patent to the vertebrobasilar junction without stenosis. Patent left PICA. Right PICA not seen, and may be occluded given the acute right cerebellar infarct. Probable short fenestration within the proximal basilar artery noted (series 10, image 273). Basilar patent distally without significant stenosis or other acute finding. Superior cerebral arteries are patent proximally. Both of the posterior cerebral artery supplied via the basilar. PCAs patent proximally.  Suspected bilateral distal P3/P4 occlusions (series 12, image 126 on the left, image 140 on the right). Venous sinuses: Patent. Anatomic variants: None significant. Delayed phase: No abnormal enhancement. Review of the MIP images confirms the above findings IMPRESSION: CT HEAD IMPRESSION 1. 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 Associated trace petechial hemorrhage at the occipital poles without frank hemorrhagic transformation. 2. Associated mass effect within the right posterior fossa with partial effacement of the right basilar cisterns and fourth ventricle. No obstructive hydrocephalus at this time. CTA HEAD AND NECK IMPRESSION 1. Acute right vertebral artery dissection, involving the right V3 segment at the level of the right C1 transverse foramen, with probable small dissection flap just distally at the right V3/V4 junction. Associated moderate stenosis. 2. 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 3. Otherwise negative CTA of the head and neck. Normal left vertebral artery. 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 Critical Value/emergent results were called by telephone at the time of interpretation on 01/02/2019 at 9:40 pm to Dr. Lennice Sites , who verbally acknowledged these results. Electronically Signed   By: Jeannine Boga M.D.   On: 01/02/2019 21:45   Ct Head  Wo Contrast  Result Date: 01/03/2019 CLINICAL DATA:  Stroke follow-up EXAM: CT HEAD WITHOUT CONTRAST TECHNIQUE: Contiguous axial images were obtained from the base of the skull through the vertex without intravenous contrast. COMPARISON:  Head CT 01/02/2019 FINDINGS: Brain: Unchanged appearance of large infarct within the right cerebellar hemisphere with associated narrowing of the fourth ventricle and basal cisterns. Unchanged hypoattenuation within the thalami  extending into the cerebral peduncles. Unchanged decreased gray-white differentiation and both occipital lobes. No intraparenchymal hematoma. Possible petechial trace blood products within both occipital lobes are unchanged. Vascular: No abnormal hyperdensity of the major intracranial arteries or dural venous sinuses. No intracranial atherosclerosis. Skull: The visualized skull base, calvarium and extracranial soft tissues are normal. Sinuses/Orbits: No fluid levels or advanced mucosal thickening of the visualized paranasal sinuses. No mastoid or middle ear effusion. The orbits are normal. IMPRESSION: 1. Unchanged appearance of posterior circulation infarcts involving the right cerebellum, both thalami and both occipital lobes. 2. Unchanged posterior fossa edema with narrowing of the basal cisterns and fourth ventricle without hydrocephalus. Electronically Signed   By: Ulyses Jarred M.D.   On: 01/03/2019 04:40   Ct Angio Neck W And/or Wo Contrast  Result Date: 01/02/2019 CLINICAL DATA:  Initial evaluation for acute lethargy, vertigo. EXAM: CT ANGIOGRAPHY HEAD AND NECK TECHNIQUE: Multidetector CT imaging of the head and neck was performed using the standard protocol during bolus administration of intravenous contrast. Multiplanar CT image reconstructions and MIPs were obtained to evaluate the vascular anatomy. Carotid stenosis measurements (when applicable) are obtained utilizing NASCET criteria, using the distal internal carotid diameter as the denominator. CONTRAST:  10mL ISOVUE-370 IOPAMIDOL (ISOVUE-370) INJECTION 76% COMPARISON:  None available. FINDINGS: CT HEAD FINDINGS Brain: Cerebral volume within normal limits. Confluent hypodensity involving the right cerebellum, compatible with evolving acute ischemic infarct. Possible more mild involvement within the adjacent left cerebellar hemisphere, although not entirely certain. Evolving small acute bilateral PCA territory infarcts, right greater than left.  Evolving acute bilateral thalamic infarcts. Trace hyperdensity at the bilateral occipital poles compatible with associated petechial hemorrhage (series 7, image 58). No frank hemorrhagic transformation. Associated mass effect within the right posterior fossa with partial effacement of the fourth ventricle and basilar cistern crowding. Fourth ventricle remains patent at this time. No obstructive hydrocephalus. Remainder of the brain is normal in appearance. No mass lesion or midline shift. No extra-axial fluid collection. Vascular: No hyperdense vessel. Skull: Scalp soft tissues and calvarium within normal limits. Sinuses: Paranasal sinuses and mastoids are clear. Orbits: Globes and orbital soft tissues within normal limits. Review of the MIP images confirms the above findings CTA NECK FINDINGS Aortic arch: Partially visualized aortic arch normal in caliber with normal branch pattern. No hemodynamically significant stenosis about the origin of the great vessels. Visualized subclavian arteries patent. Right carotid system: Right common and internal carotid arteries widely patent without stenosis, dissection, or occlusion. Left carotid system: Left common and internal carotid arteries widely patent without stenosis, dissection, or occlusion. Vertebral arteries: Both vertebral arteries arise from the subclavian arteries. Left vertebral artery widely patent and normal in appearance. Right vertebral artery widely patent and normal in appearance throughout its P2 segment. There is multifocal intimal irregularity with associated moderate stenosis involving the left V3 segment at the level of the C1 transverse foramen (series 10, image 33). Finding compatible with acute arterial dissection. Subtle probable flap noted just distally within the right V3 segment just prior to crossing into the cranial vault (series 12, image 135). Skeleton: No acute osseous abnormality. No  discrete lytic or blastic osseous lesions. Pronounced  thoracic dextroscoliosis with bilateral Harrington rod fixation partially visualized. Other neck: No other acute soft tissue abnormality within the neck. Upper chest: Partially visualized upper chest demonstrates no acute finding. Review of the MIP images confirms the above findings CTA HEAD FINDINGS Anterior circulation: Internal carotid arteries widely patent to the termini without stenosis. A1 segments, anterior communicating artery, and anterior cerebral arteries widely patent. M1 segments widely patent bilaterally. Normal MCA bifurcations. Distal MCA branches well perfused and symmetric. Posterior circulation: Probable small dissection flap at the right V3/V4 junction as the right vertebral crosses into the cranial vault as above. Right V4 segment otherwise normal in appearance and caliber without acute abnormality. Left vertebral widely patent to the vertebrobasilar junction without stenosis. Patent left PICA. Right PICA not seen, and may be occluded given the acute right cerebellar infarct. Probable short fenestration within the proximal basilar artery noted (series 10, image 273). Basilar patent distally without significant stenosis or other acute finding. Superior cerebral arteries are patent proximally. Both of the posterior cerebral artery supplied via the basilar. PCAs patent proximally. Suspected bilateral distal P3/P4 occlusions (series 12, image 126 on the left, image 140 on the right). Venous sinuses: Patent. Anatomic variants: None significant. Delayed phase: No abnormal enhancement. Review of the MIP images confirms the above findings IMPRESSION: CT HEAD IMPRESSION 1. 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 Associated trace petechial hemorrhage at the occipital poles without frank hemorrhagic transformation. 2. Associated mass effect within the right posterior fossa with partial effacement  of the right basilar cisterns and fourth ventricle. No obstructive hydrocephalus at this time. CTA HEAD AND NECK IMPRESSION 1. Acute right vertebral artery dissection, involving the right V3 segment at the level of the right C1 transverse foramen, with probable small dissection flap just distally at the right V3/V4 junction. Associated moderate stenosis. 2. 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 3. Otherwise negative CTA of the head and neck. Normal left vertebral artery. 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 Critical Value/emergent results were called by telephone at the time of interpretation on 01/02/2019 at 9:40 pm to Dr. Lennice Sites , who verbally acknowledged these results. Electronically Signed   By: Jeannine Boga M.D.   On: 01/02/2019 21:45   Mr Brain Wo Contrast  Result Date: 01/03/2019 CLINICAL DATA:  Follow-up vertebral dissection.  Dizziness. EXAM: MRI HEAD WITHOUT CONTRAST TECHNIQUE: Multiplanar, multiecho pulse sequences of the brain and surrounding structures were obtained without intravenous contrast. COMPARISON:  CT head and CTA head neck 01/02/2019, 01/03/2019. FINDINGS: Brain: Large area of restricted diffusion affects much of the RIGHT cerebellum, portion of the RIGHT PICA territory, RIGHT AICA, and RIGHT SCA vascular distributions. Additional areas of restricted diffusion involve the RIGHT occipital lobe, RIGHT thalamus, LEFT thalamus, and LEFT posterior temporal lobe. Mild mass effect on the brainstem. No definite hemorrhagic transformation. Effacement of the fourth ventricular outlet foramina, and RIGHT-to-LEFT displacement of the fourth ventricle, without frank hydrocephalus. Brain otherwise normal. Vascular: Asymmetric abnormal flow void in the RIGHT vertebral reflects the dissection, seen better on CTA. Skull and upper cervical spine: Normal marrow signal. No tonsillar herniation. Sinuses/Orbits: No sinus or  orbital disease. Other: None. IMPRESSION: Multifocal areas of acute infarction, predominantly affecting the RIGHT cerebellum reflecting RIGHT vertebral dissection, but also areas of acute infarction elsewhere in the posterior circulation, notably both thalami, LEFT temporal lobe,  and RIGHT occipital lobe reflecting thalamoperforate and BILATERAL PCA territory ischemia, consistent with distal emboli. Abnormal flow void RIGHT vertebral reflecting the previously identified dissection. No features to suggest hemorrhagic transformation. Significant mass effect on the outlet foramina the fourth ventricle without current features of hydrocephalus. Close continued surveillance is warranted. Electronically Signed   By: Staci Righter M.D.   On: 01/03/2019 14:27   2D Echocardiogram   1. The left ventricle has normal systolic function with an ejection fraction of 60-65%. The cavity size was normal. Left ventricular diastolic parameters were normal.  2. The right ventricle has normal systolic function. The cavity was normal. There is no increase in right ventricular wall thickness.  3. The mitral valve is normal in structure.  4. The tricuspid valve is normal in structure.  5. The aortic valve is normal in structure.  6. The aortic root and ascending aorta are normal in size and structure.  7. The interatrial septum was not assessed.   PHYSICAL EXAM General: Patient is still drowsy, opens eyes to voice but keeps falling asleep easily HEENT: Normocephalic atraumatic dry mucous membranes Lungs: Clear to auscultation CVS: S1-2 heard regular rhythm Abdomen: Soft nondistended nontender Extremities: Warm well perfused intact pulses Neurological exam She is still drowsy, opens eyes to voice but keeps falling asleep  She is able to name objects reliably.  Her speech is non-dysarthric.  Comprehension is intact. Repetition is intact.  Follows all commands appropriately, able to maintain attention when  stimualted Cranial nerves: Pupils are equal round reactive to light, extraocular movements are intact, visual fields are full to threat, face is symmetric, auditory acuity intact, palate midline, tongue midline. Motor exam: 5/5 in all extremities  Sensory exam: Intact to light touch all over Coordination: mild on ataxia in RUE Gait testing was deferred at this time    ASSESSMENT/PLAN Ms. RINIYAH SPEICH is a 38 y.o. female with history of ADD, panic attachs presenting with anxiety and ataxia. Found to have R sided neck pain, HA. 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 Mass effect R post fossa w/ partial effacement R basilar cisterns and 4th ventricle  CTA head & neck R VA dissction R V3 w/ flap distal R V3/4. probabl distal B P3/4 occlusions  F/u CT unchanged post circ infarcts. Unchanged edema w/o hydro  MRI   Multifocal R cerebellar infarction reflecting R VA (B thalami, LEFT temporal lobe, and RIGHT occipital lobe reflecting thalamoperforate and B PCA) Significant mass effect on fourth ventricle without hydrocephalus  2D Echo  EF 60-65%. No source of embolus   LDL 88  HgbA1c 5.7  SCDs for VTE prophylaxis  No antithrombotic prior to admission, now on No antithrombotic. Given dissection, added aspirin 325 mg daily  Therapy recommendations:  CIR  Disposition:  pending   Continue ICU level care  Cerebral Edema Induced Hypernatremia  Na remains 140  D/c 3% protocol  Blood Pressure  Stable . No hx HTN . Permissive hypertension (OK if < 220/120) but gradually normalize in 5-7 days . Long-term BP goal normotensive  Other Stroke Risk Factors  Obesity, Body mass index is 32.61 kg/m., recommend  weight loss, diet and exercise as appropriate  Other Active Problems  ADD, on adderall PTA, on hold  Panic attacks, resume [facility] day # 2  Burnetta Sabin, MSN, APRN, ANVP-BC, AGPCNP-BC Advanced Practice Stroke Nurse [place] for Schedule & Pager information 01/04/2019 8:47 AM  MRI consistent with strokes seen on [facility] Started on ASA 325mg  daily for Vert dissection, tolerated well Nsurg on board, watching for edema and possible need for Ventric/Suboccipital crani Will stop on 3% as she is young with healthy kidneys and her Na is not increasing. Will monitor for clinical change for edema/hydrocephalus and if requires interventions as above, will call neurosurgery. Today is day 3 for peak edema watch.  To contact Stroke Continuity provider, please refer to [email or web address] After hours, contact General Neurology

## 2019-01-04 NOTE — Evaluation (Signed)
Speech Language Pathology Evaluation Patient Details Name: [name] MRN: [id number] DOB: December 02, 1980 Today's Date: 01/04/2019 Time: 3354-5625 SLP Time Calculation (min) (ACUTE ONLY): 30 min  Problem List:  Patient Active Problem List   Diagnosis Date Noted  . CVA (cerebral vascular accident) ([place]) 01/02/2019  . Acute ischemic stroke ([place]) 01/02/2019  . S/P cesarean section 05/05/2016  . S/P tubal ligation 05/05/2016  . Term pregnancy, repeat 05/04/2016  . Unspecified vitamin D deficiency 12/18/2012  . ADHD (attention deficit hyperactivity disorder) 11/26/2011  . Dysthymia 11/26/2011   Past Medical History:  Past Medical History:  Diagnosis Date  . ADD (attention deficit disorder)   . Exercise-induced asthma   . Hx of varicella   . Kidney stones   . S/P cesarean section 05/05/2016  . S/P tubal ligation 05/05/2016  . Term pregnancy, repeat 05/04/2016   Past Surgical History:  Past Surgical History:  Procedure Laterality Date  . BACK SURGERY     scoliosis age 88  . BILATERAL SALPINGECTOMY Bilateral 05/05/2016   Procedure: BILATERAL SALPINGECTOMY;  Surgeon: Janyth Contes, MD;  Location: [place];  Service: Obstetrics;  Laterality: Bilateral;  . CESAREAN SECTION    . CESAREAN SECTION N/A 05/05/2016   Procedure: CESAREAN SECTION;  Surgeon: Janyth Contes, MD;  Location: [place];  Service: Obstetrics;  Laterality: N/A;  . CHOLECYSTECTOMY    . HAND SURGERY    . HERNIA REPAIR    . KIDNEY STONE SURGERY     R--surgery; L lithotripsy.  Dr. Gaynelle Arabian   HPI:  Pt is a 38 y.o. female PMH of ADD, panic attacks, frequent headaches/migraines, presented to the ED and was seen on hospital for evaluation of an episode of panic attack. Head CT (2/12) showed an evolving acute ischemic R cerebellar and bilateral PCA territory infarcts with involvement of bilateral occipital lobes and thalami along with trace petechial hemorrhage at the occipital poles without  frank hemorrhagic transformation.   Assessment / Plan / Recommendation Clinical Impression  Pt presenting with R cerebellar and bilateral occipital and thalamic CVA seen for speech/language eval with family present. Pt is a working mother of 4 kids. Her expressive and receptive langauge [facility] for tasks assessed. During informal conversation, pt's speech noted to be mildly dysarthric, however pt not aware and was not addressed during session. During reading task, pt deleted/distorted words and numbers x4 in R visual field. She required min verbal cues to adjust paper and place all words in visual field. When solving word problem pt required mod verbal cues/repetitions to self- correct and exhibited impairment in organization and alternating attention during this task. Given mod deficits in executive function, recommend SLP f/u to provide exercises and compensatory stratesgies to improve function in ADLs. Pt would benefit from intensive SLP intervention to maximize independence, recommend CIR.    SLP Assessment  SLP Recommendation/Assessment: Patient needs continued Speech Lanaguage Pathology Services SLP Visit Diagnosis: Cognitive communication deficit (R41.841)    Follow Up Recommendations  Inpatient Rehab    Frequency and Duration min 3x week  2 weeks      SLP Evaluation Cognition  Overall Cognitive Status: Impaired/Different from baseline Arousal/Alertness: Awake/alert Orientation Level: Oriented X4 Attention: Alternating;Sustained Sustained Attention: Appears intact Alternating Attention: Impaired Memory: Appears intact Awareness: Appears intact Problem Solving: Appears intact Executive Function: Organizing;Self Correcting Organizing: Impaired Organizing Impairment: Functional complex;Verbal complex Self Correcting: Impaired Self Correcting Impairment: Functional complex;Verbal complex Safety/Judgment: (unable to assess)       Comprehension  Auditory Comprehension Overall  Auditory  Comprehension: Appears within functional limits for tasks assessed Yes/No Questions: Not tested Commands: Not tested Conversation: Complex Interfering Components: Visual impairments;Processing speed EffectiveTechniques: Extra processing time;Stressing words;Repetition Visual Recognition/Discrimination Discrimination: Not tested Reading Comprehension Reading Status: Impaired Word level: Within functional limits Sentence Level: Within functional limits Paragraph Level: Impaired Functional Environmental (signs, name badge): Not tested Interfering Components: Visual perceptual Effective Techniques: Eye glasses;Verbal cueing    Expression Expression Primary Mode of Expression: Verbal Verbal Expression Overall Verbal Expression: Appears within functional limits for tasks assessed Initiation: No impairment Level of Generative/Spontaneous Verbalization: Conversation Repetition: (NT) Naming: Not tested Pragmatics: No impairment Written Expression Dominant Hand: Right Written Expression: Not tested   Oral / Motor  Oral Motor/Sensory Function Overall Oral Motor/Sensory Function: Within functional limits Motor Speech Overall Motor Speech: Appears within functional limits for tasks assessed Respiration: Within functional limits Phonation: Normal Resonance: Within functional limits Articulation: Within functional limitis Intelligibility: Intelligible Motor Planning: Witnin functional limits Motor Speech Errors: Not applicable   GO                    Ellis Savage, SLP Student 01/04/2019, 11:20 AM

## 2019-01-04 NOTE — Progress Notes (Signed)
Inpatient Rehabilitation Admissions Coordinator  I met with patient and her spouse at bedside to discuss goals and expectations of an inpt rehab admit. They are in agreement. I will begin insurance authorization with [facility] for a possible admit pending insurance approval.  Danne Baxter, RN, MSN Rehab Admissions Coordinator [phone] 01/04/2019 2:58 PM

## 2019-01-04 NOTE — Progress Notes (Signed)
Remains somewhat somnolent.  Patient awakens and follows commands bilaterally.  Speech reasonably fluent.  Denies significant headache.  Overall doing well.  No new recommendations.

## 2019-01-05 ENCOUNTER — Inpatient Hospital Stay (HOSPITAL_COMMUNITY): Payer: 59

## 2019-01-05 DIAGNOSIS — R030 Elevated blood-pressure reading, without diagnosis of hypertension: Secondary | ICD-10-CM

## 2019-01-05 DIAGNOSIS — F410 Panic disorder [episodic paroxysmal anxiety] without agoraphobia: Secondary | ICD-10-CM

## 2019-01-05 DIAGNOSIS — I7774 Dissection of vertebral artery: Secondary | ICD-10-CM

## 2019-01-05 DIAGNOSIS — F329 Major depressive disorder, single episode, unspecified: Secondary | ICD-10-CM

## 2019-01-05 NOTE — Progress Notes (Signed)
Occupational Therapy Treatment Patient Details Name: [name] MRN: [id number] DOB: 06/18/1981 Today's Date: 01/05/2019    History of present illness Pt is a 38 y/o female with PMH of ADD, panic attacks, presented to ER for evaluation of episode of panic attack with reports of increased R sided neck pain and headache.  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 CT on 01/05/2019 shows slight increase in hydrocephalus and narrowing of the third ventricle.   OT comments  This 38 yo female admitted with above presents to acute OT today making great progress with grooming and sponge bathing. She still has decreased balance, decreased coordination of RUE, and diplopia all affecting her safety and independence with basic ADLs in a normal way and thus will continue to benefit from acute OT with follow up OT on CIR to get to a Mod I to Independent level.  Follow Up Recommendations  CIR;Supervision/Assistance - 24 hour    Equipment Recommendations  Other (comment)(TBD next venue)       Precautions / Restrictions Precautions Precautions: Fall Restrictions Weight Bearing Restrictions: No       Mobility Bed Mobility               General bed mobility comments: Pt up in recliner upon entering room  Transfers Overall transfer level: Needs assistance   Transfers: Sit to/from Stand Sit to Stand: Min guard              Balance Overall balance assessment: Needs assistance Sitting-balance support: Feet supported;No upper extremity supported Sitting balance-Leahy Scale: Fair     Standing balance support: Bilateral upper extremity supported Standing balance-Leahy Scale: Poor                             ADL either performed or assessed with clinical judgement   ADL Overall ADL's : Needs assistance/impaired     Grooming: Oral care;Standing;Min guard Grooming Details (indicate cue  type and reason): tendency to lean to left but not off balance Upper Body Bathing: Supervision/ safety;Set up;Sitting Upper Body Bathing Details (indicate cue type and reason): at sink Lower Body Bathing: Min guard;Sit to/from stand Lower Body Bathing Details (indicate cue type and reason): tendency to lean to left but not off balance     Lower Body Dressing: Min guard;Sit to/from stand Lower Body Dressing Details (indicate cue type and reason): socks and underwear Toilet Transfer: Moderate assistance;Ambulation;BSC Toilet Transfer Details (indicate cue type and reason): in bathroom; wtih LOB x2 with A to correct, no AD Toileting- Clothing Manipulation and Hygiene: Min guard;Sit to/from stand         General ADL Comments: Pt with decreased coordination of RUE, dropped washcloth several times while washing up     Vision Baseline Vision/History: Wears glasses Wears Glasses: Reading only Patient Visual Report: Diplopia Additional Comments: Have pt wearing partial occlusion glasses during ADL          Cognition Arousal/Alertness: Awake/alert Behavior During Therapy: Flat affect Overall Cognitive Status: Impaired/Different from baseline Area of Impairment: Attention;Following commands;Safety/judgement;Problem solving                   Current Attention Level: Sustained   Following Commands: Follows one step commands consistently Safety/Judgement: Decreased awareness of deficits   Problem Solving: Difficulty sequencing;Requires verbal cues General Comments: With sponge bathing she kept asking "what next" and "am I doing good"  Pertinent Vitals/ Pain       Pain Assessment: No/denies pain         Frequency  Min 3X/week        Progress Toward Goals  OT Goals(current goals can now be found in the care plan section)  Progress towards OT goals: Progressing toward goals     Plan Discharge plan remains appropriate;Frequency remains  appropriate       AM-PAC OT "6 Clicks" Daily Activity     Outcome Measure   Help from another person eating meals?: A Little Help from another person taking care of personal grooming?: A Little Help from another person toileting, which includes using toliet, bedpan, or urinal?: A Little Help from another person bathing (including washing, rinsing, drying)?: A Little Help from another person to put on and taking off regular upper body clothing?: A Little Help from another person to put on and taking off regular lower body clothing?: A Little 6 Click Score: 18    End of Session Equipment Utilized During Treatment: Gait belt  OT Visit Diagnosis: Other abnormalities of gait and mobility (R26.89);Other symptoms and signs involving cognitive function;Cognitive communication deficit (R41.841);Dizziness and giddiness (R42);Ataxia, unspecified (R27.0)   Activity Tolerance Patient tolerated treatment well   Patient Left in chair;with call bell/phone within reach;with chair alarm set;with family/visitor present           Time: 3567-0141 OT Time Calculation (min): 36 min  Charges: OT General Charges $OT Visit: 1 Visit OT Treatments $Self Care/Home Management : 23-37 mins  [place], OTR/L Acute [employer] Pager [phone] Office [phone]      Almon Register 01/05/2019, 5:23 PM

## 2019-01-05 NOTE — Progress Notes (Signed)
Inpatient Rehabilitation Admissions Coordinator  I contacted Dr. Leonie Man to clarify medical readiness to d/c to CIR. He would like to observe patient overnight in ICU, will consider d/c to CIR Saturday pending his assessment tomorrow. I met with pt's spouse at bedside. Pt sleeping. He is aware we will plan CIR admit tomorrow possibly pending Dr. Clydene Fake' assessment tomorrow.   Dr. Naaman Plummer will be admitting MD to [facility] Saturday if cleared by Dr. Leonie Man. 4N ICU RN can contact charge nurse at [phone] once that decision is made. I will make plans for potential admit Saturday.  Danne Baxter, RN, MSN Rehab Admissions Coordinator [phone] 01/05/2019 1:59 PM

## 2019-01-05 NOTE — Progress Notes (Signed)
STROKE TEAM PROGRESS NOTE   INTERVAL HISTORY Husband at bedside. Pt is sitting up in restaurant. As per husband she is still slightly drowsy but is able to participate with therapy. She complains of mild posterior headache and diplopia. Follow-up CT scan of the head done today shows slight increase in hydrocephalus and narrowing of the third ventricle.  Vitals:   01/05/19 0900 01/05/19 1000 01/05/19 1041 01/05/19 1200  BP: (!) 150/97 (!) 145/98 (!) 141/94   Pulse: 64 79 86   Resp: 12 16    Temp:    98.3 F (36.8 C)  TempSrc:    Oral  SpO2: 97% 96% 95%   Weight:      Height:        CBC:  Recent Labs  Lab 01/02/19 0903  WBC 13.2*  NEUTROABS 10.6*  HGB 13.9  HCT 42.4  MCV 90.4  PLT 706    Basic Metabolic Panel:  Recent Labs  Lab 01/02/19 0903  01/04/19 0408 01/04/19 1159  NA 137   < > 140 140  K 3.3*  --   --   --   CL 108  --   --   --   CO2 20*  --   --   --   GLUCOSE 179*  --   --   --   BUN 11  --   --   --   CREATININE 0.63  --   --   --   CALCIUM 8.2*  --   --   --    < > = values in this interval not displayed.   Lipid Panel:     Component Value Date/Time   CHOL 160 01/03/2019 0440   TRIG 74 01/03/2019 0440   HDL 57 01/03/2019 0440   CHOLHDL 2.8 01/03/2019 0440   VLDL 15 01/03/2019 0440   LDLCALC 88 01/03/2019 0440   HgbA1c:  Lab Results  Component Value Date   HGBA1C 5.7 (H) 01/03/2019   Urine Drug Screen:     Component Value Date/Time   LABOPIA NONE DETECTED 01/02/2019 1923   COCAINSCRNUR NONE DETECTED 01/02/2019 1923   LABBENZ NONE DETECTED 01/02/2019 1923   AMPHETMU POSITIVE (A) 01/02/2019 1923   THCU NONE DETECTED 01/02/2019 1923   LABBARB NONE DETECTED 01/02/2019 1923    Alcohol Level     Component Value Date/Time   ETH <10 01/02/2019 1923    IMAGING Ct Head Wo Contrast  Result Date: 01/05/2019 CLINICAL DATA:  Stroke follow-up EXAM: CT HEAD WITHOUT CONTRAST TECHNIQUE: Contiguous axial images were obtained from the base of the  skull through the vertex without intravenous contrast. COMPARISON:  01/03/2019 FINDINGS: Brain: Cytotoxic edema within the right cerebellum has worsened with greater leftward shift of the midline posterior fossa structures and worsened mass effect on the cerebral aqueduct. There is early obstructive hydrocephalus with increased size of the lateral and third ventricles compared to the prior study. There is no acute hemorrhage. Slight worsening of edema within the right thalamus. Unchanged appearance of occipital infarcts. Unchanged appearance of left thalamus. Vascular: No abnormal hyperdensity of the major intracranial arteries or dural venous sinuses. No intracranial atherosclerosis. Skull: The visualized skull base, calvarium and extracranial soft tissues are normal. Sinuses/Orbits: No fluid levels or advanced mucosal thickening of the visualized paranasal sinuses. No mastoid or middle ear effusion. The orbits are normal. IMPRESSION: 1. 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 2. Otherwise expected evolution of thalamic and bilateral occipital lobe infarcts without acute hemorrhage. Electronically Signed   By: Ulyses Jarred M.D.   On: 01/05/2019 14:16   2D Echocardiogram   1. The left ventricle has normal systolic function with an ejection fraction of 60-65%. The cavity size was normal. Left ventricular diastolic parameters were normal.  2. The right ventricle has normal systolic function. The cavity was normal. There is no increase in right ventricular wall thickness.  3. The mitral valve is normal in structure.  4. The tricuspid valve is normal in structure.  5. The aortic valve is normal in structure.  6. The aortic root and ascending aorta are normal in size and structure.  7. The interatrial septum was not assessed.   PHYSICAL EXAM General:  pleasant young Caucasian ladyPatient is awake and interactive HEENT: Normocephalic atraumatic dry mucous membranes Lungs: Clear to auscultation CVS: S1-2 heard regular rhythm Abdomen: Soft nondistended nontender Extremities: Warm well perfused intact pulses Neurological Exam She is awake and alert. She follows commands well She is able to name objects reliably.  Her speech is non-dysarthric.  Comprehension is intact. Repetition is intact.  Follows all commands appropriately, able to maintain attention when stimualted Cranial nerves: Pupils are equal round reactive to light, extraocular movements are intact, visual fields are full to threat, face is symmetric, auditory acuity intact, palate midline, tongue midline. Motor exam: 5/5 in all extremities  Sensory exam: Intact to light touch all over Coordination: mild on ataxia in RUE Gait testing was deferred at this time    ASSESSMENT/PLAN [name] is a 38 y.o. female with history of ADD, panic attachs presenting with anxiety and ataxia. Found to have R sided neck pain, HA. 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 Mass effect R post fossa w/ partial effacement R basilar cisterns and 4th ventricle  CTA head & neck R VA dissction R V3 w/ flap distal R V3/4. probabl distal B P3/4 occlusions  F/u CT unchanged post circ infarcts. Unchanged edema w/o hydro  MRI   Multifocal R cerebellar infarction reflecting R VA (B thalami, LEFT temporal lobe, and RIGHT occipital lobe reflecting thalamoperforate and B PCA) Significant mass effect on fourth ventricle without hydrocephalus  2D Echo  EF 60-65%. No source of embolus   LDL 88  HgbA1c 5.7  SCDs for VTE  prophylaxis  No antithrombotic prior to admission, now on No antithrombotic. Given dissection, added aspirin 325 mg daily  Therapy recommendations:  CIR  Disposition:  pending   Continue ICU level care  Cerebral Edema Induced Hypernatremia  Na remains 140  D/c 3% protocol  Blood Pressure  Stable . No hx HTN . Permissive hypertension (OK if < 220/120) but gradually normalize in 5-7 days . Long-term BP goal normotensive  Other Stroke Risk Factors  Obesity, Body mass index is 32.61 kg/m., recommend weight loss, diet and exercise as appropriate   Other Active Problems  ADD, on adderall PTA, on hold  Panic attacks, resume [facility] day # 3   MRI consistent with strokes seen on [facility] Started on ASA 325mg  daily for Vert dissection, tolerated well  Nsurg on board, clinically patient looks a lot better than her CT scan which actually shows slight worsening hydrocephalus hence we will not transfer out of the ICU or to rehabilitation today but watch her carefully for one more day watching for progressive edema and possible need for Ventric/Suboccipital crani  discussed with patient and her husband and answered questions and rehabilitation team. This patient is critically ill and at significant risk of neurological worsening, death and care requires constant monitoring of vital signs, hemodynamics,respiratory and cardiac monitoring, extensive review of multiple databases, frequent neurological assessment, discussion with family, other specialists and medical decision making of high complexity.I have made any additions or clarifications directly to the above note.This critical care time does not reflect procedure time, or teaching time or supervisory time of PA/NP/Med Resident etc but could involve care discussion time.  I spent 30 minutes of neurocritical care time  in the care of  this patient. Antony Contras, MD Medical Director [facility] Pager:  [phone] 01/05/2019 2:24 PM  To contact Stroke Continuity provider, please refer to [email or web address] After hours, contact General Neurology

## 2019-01-05 NOTE — PMR Pre-admission (Addendum)
PMR Admission Coordinator Pre-Admission Assessment  Patient: [name] is an 38 y.o., female MRN: [id number] DOB: 1981/04/22 Height: 5\' 4"  (162.6 cm) Weight: 86.2 kg              Insurance Information HMO:     PPO: yes     PCP:      IPA:      80/20:      OTHER:  PRIMARY: [place]      Policy#: [id number]      Subscriber: pt CM Name: Larene Beach      Phone#: [phone]     Fax#: [phone] Pre-Cert#: [id number] approved for 7 days f/u [place] phone [phone] ext [phone] fax [phone]      Employer:  Benefits:  Phone #: [phone]     Name: 01/04/2019 Eff. Date: 06/22/2018     Deduct: $2000      Out of Pocket Max: $6500/includes deductible      Life Max: none CIR: 80%      SNF: 80% 60 days Outpatient: $25 co pay per visit     Co-Pay: 23 visits each PT, OT, and SLP Home Health: 80%      Co-Pay: visits per medical neccesity DME: 80%     Co-Pay: 20% Providers: in network  SECONDARY: Medicaid of [place]      Policy#: [id number]      Subscriber: pt Benefits:  Phone #: passport one online     Name: 01/04/2019 Eff. Date: active 01/04/2019      MAFCN  Medicaid Application Date:       Case Manager:  Disability Application Date:       Case Worker:   Emergency Contact Information Contact Information    Name Relation Home Work Mobile   Bossier,Richard Fortescue Spouse [phone]  [phone]     Current Medical History  Patient Admitting Diagnosis: Right cerebellar and bilateral PCA infarcts  History of Present Illness: SHANIQWA HORSMAN is a 38 year old right-handed female with history of ADHD maintained on Adderall and Xanax, panic attacks, exercise-induced asthma.  Presented to 01/02/2019 with lethargy, dizziness and anxiety with bouts of nausea as well as headache and neck pain. Patient could not recall any type of fall or neck hyperextension injury prior to admission. CT of the head as well as CT angiogram of head and neck showed evolving acute ischemic right cerebellar and bilateral  PCA territory infarction with involvement of the bilateral occipital lobes and thalami. Associated trace petechial hemorrhage in the occipital poles without frank hemorrhagic conversion. 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 Probable distal bilateral P3-P4 occlusion. Urine drug screen positive for amphetamines. MRI the brain showed multifocal areas of acute infarction, predominantly affecting the right cerebellum reflecting right vertebral dissection but also areas of acute infarction elsewhere in the posterior circulation notably both thalami, left temporal lobe. Noted significant mass effect on the outlet foramina the fourth ventricle without current features of hydrocephalus. Neurosurgery follow-up currently with conservative care. Patient completed a course of 3% normal saline. Maintain on aspirin for CVA prophylaxis. Tolerating a regular diet.  Repeat CT scan 2/14 demonstrates evolution of a right cerebellar infarct. SOme slightly worsened compression of her fourth ventricle. Ventricles slightly larger but not significantly so per Dr. Annette Stable. Felt may be developing some mild early hydrocephalus. Plans to continue to follow her clinical exam. Patient awake and interactive. Some complaints of mild right posterior occipital headache on 2/14. Observed overnight  before planned admit 01/06/2019.   Complete NIHSS TOTAL: 3    Past Medical History  Past Medical History:  Diagnosis Date  . ADD (attention deficit disorder)   . Exercise-induced asthma   . Hx of varicella   . Kidney stones   . S/P cesarean section 05/05/2016  . S/P tubal ligation 05/05/2016  . Term pregnancy, repeat 05/04/2016    Family History  family history includes Cancer in her maternal aunt, maternal grandmother, and mother.  Prior Rehab/Hospitalizations:  Has the patient had major surgery during 100 days prior to admission? No  Current Medications    Current Facility-Administered Medications:  .  0.9 %  sodium chloride infusion, , Intravenous, PRN, Amie Portland, MD, Stopped at 01/03/19 [phone] .  acetaminophen (TYLENOL) tablet 650 mg, 650 mg, Oral, Q4H PRN, 650 mg at 01/05/19 0833 **OR** acetaminophen (TYLENOL) solution 650 mg, 650 mg, Per Tube, Q4H PRN **OR** acetaminophen (TYLENOL) suppository 650 mg, 650 mg, Rectal, Q4H PRN, Amie Portland, MD .  aspirin EC tablet 325 mg, 325 mg, Oral, Daily, Biby, Sharon L, NP, 325 mg at 01/05/19 1129 .  senna-docusate (Senokot-S) tablet 1 tablet, 1 tablet, Oral, QHS PRN, Amie Portland, MD .  sertraline (ZOLOFT) tablet 100 mg, 100 mg, Oral, QHS, Biby, Sharon L, NP, 100 mg at 01/04/19 2103  Patients Current Diet:  Diet Order            Diet Heart Room service appropriate? Yes; Fluid consistency: Thin  Diet effective now              Precautions / Restrictions Precautions Precautions: Fall Restrictions Weight Bearing Restrictions: No   Has the patient had 2 or more falls or a fall with injury in the past year?No  Prior Activity Level Community (5-7x/wk): Independent; cares for 4 children; stay at home [place] Equipment: None  Prior Device Use: Indicate devices/aids used by the patient prior to current illness, exacerbation or injury? 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  Independent  Indoor Mobility: Did the patient need assistance with walking from room to room (with or without device)? Independent  Stairs: Did the patient need assistance with internal or external stairs (with or without device)? Independent  Functional Cognition: Did the patient need help planning regular tasks such as shopping or remembering to take medications?  Independent  Current Functional Level Cognition  Arousal/Alertness: Awake/alert Overall Cognitive Status: Impaired/Different from baseline Current Attention Level: Sustained Orientation Level: Oriented X4 Following Commands: Follows one step commands consistently, Follows one step commands with increased time Safety/Judgement: Decreased awareness of safety, Decreased awareness of deficits General Comments: pt with very impaired co-ordination, delayed processing  Attention: Alternating, Sustained Sustained Attention: Appears intact Alternating Attention: Impaired Memory: Appears intact Awareness: Appears intact Problem Solving: Appears intact Executive Function: Organizing, Self Correcting Organizing: Impaired Organizing Impairment: Functional complex, Verbal complex Self Correcting: Impaired Self Correcting Impairment: Functional complex, Verbal complex Safety/Judgment: (unable to assess)    Extremity Assessment (includes Sensation/Coordination)  Upper Extremity Assessment: Defer to OT evaluation RUE Deficits / Details: grossly 4+/5 MMT, dysmetric  RUE Coordination: decreased fine motor, decreased gross motor LUE Deficits / Details: grossly 4+/5, appears slightly dysmetric but better than R UE  LUE Coordination: decreased gross motor  Lower Extremity Assessment: RLE  deficits/detail RLE Deficits / Details: MMT testing at 5/5 grossly  however co-oridantion an motor planning/sequencing significantly impaired RLE Coordination: decreased gross motor(ataxic)    ADLs  Overall ADL's : Needs assistance/impaired Grooming: Wash/dry hands, Minimal assistance, Standing(+2 safety standing ) Upper Body Bathing: Minimal assistance, Sitting Lower Body Bathing: Moderate assistance, Sit to/from stand, +2 for safety/equipment, +2 for physical assistance Upper Body Dressing : Moderate assistance, Sitting Lower Body Dressing: Moderate assistance, +2 for physical assistance, +2 for  safety/equipment, Sit to/from stand Toilet Transfer: Minimal assistance, Moderate assistance, +2 for physical assistance, +2 for safety/equipment, Ambulation, Grab bars, Regular Toilet Toilet Transfer Details (indicate cue type and reason): patient requires +2 for safety, mod assist during ambulation but min assist for transfers  Toileting- Clothing Manipulation and Hygiene: Moderate assistance, +2 for physical assistance, +2 for safety/equipment, Sit to/from stand Toileting - Clothing Manipulation Details (indicate cue type and reason): assist with clothing mgmt down and steadying assist for 0 hand support to manage clohting up  Functional mobility during ADLs: Moderate assistance, +2 for physical assistance, +2 for safety/equipment, Cueing for safety, Cueing for sequencing    Mobility  Overal bed mobility: Needs Assistance Bed Mobility: Rolling, Supine to Sit Rolling: Supervision Supine to sit: Min guard, HOB elevated, +2 for safety/equipment General bed mobility comments: sidelying upon entry, multimodal cueing but able to roll in bed with supervision .  Needed cues to slow down due to impulsivitiy.     Transfers  Overall transfer level: Needs assistance Equipment used: 2 person hand held assist, Rolling walker (2 wheeled) Transfers: Sit to/from Stand Sit to Stand: Min assist, +2 safety/equipment, +2 physical assistance General transfer comment: min assist with HHA x2, attempted with RW with pt performing sit to stand with RW with min guard x2 for safety and requiring vc for hand placement    Ambulation / Gait / Stairs / Wheelchair Mobility  Ambulation/Gait Ambulation/Gait assistance: Min assist, +2 physical assistance, +2 safety/equipment Gait Distance (Feet): 300 Feet Assistive device: 2 person hand held assist, Rolling walker (2 wheeled) Gait Pattern/deviations: Step-through pattern, Decreased stride length, Ataxic, Narrow base of support General Gait Details: pt ambulated with  occluded glasses and progressed to two person HHA min assist. Pt ambulated with RW to assess for improvement with balance and coordination and gait pattern improved with RW. Pt required cuing to widen her base of support. Husband followed with chair behind for safety. Pt continues to have impaired RLE coordination and motor planning during ambulation however it is improved since yesterday Gait velocity: decreased Gait velocity interpretation: <1.8 ft/sec, indicate of risk for recurrent falls    Posture / Balance Dynamic Sitting Balance Sitting balance - Comments: min guard for safety dynamically  Balance Overall balance assessment: Needs assistance Sitting-balance support: Feet supported, No upper extremity supported Sitting balance-Leahy Scale: Fair Sitting balance - Comments: min guard for safety dynamically  Standing balance support: Bilateral upper extremity supported Standing balance-Leahy Scale: Poor Standing balance comment: reliant on UE and external support    Special needs/care consideration BiPAP/CPAP n/a CPM n/a Continuous Drip IV n/a Dialysis n/a Life Vest n/a Oxygen n/a Special Bed n/a Trach Size n/a Wound Vac n/a Skin intact Bowel mgmt: no LBM documented Bladder mgmt: continent Diabetic mgmt Hgb A1c 5.7 Patient with history of panic attacks   Previous Home Environment Living Arrangements: Spouse/significant other, Children  Lives With: Spouse(4 children) Available Help at Discharge: Family, Available 24 hours/day Type of Home: House Home Layout: Two level Alternate Level Stairs-Number of Steps: flight  Home Access: Stairs to enter [employer] of Steps: 5 Bathroom Shower/Tub: Multimedia programmer: Standard Bathroom Accessibility: Yes How Accessible: Accessible via walker [place]: No  Discharge Living Setting Plans for Discharge Living Setting: Patient's home, Lives with (comment)(spouse and 4 children) Type of Home at  Discharge: House Discharge Home Layout: Two level Alternate Level Stairs-Number of Steps: flight Discharge Home Access: Stairs to enter Entrance Stairs-Number of Steps: 5 Discharge Bathroom Shower/Tub: Walk-in shower Discharge Bathroom Toilet: Standard Discharge Bathroom Accessibility: Yes How Accessible: Accessible via walker Does the patient have any problems obtaining your medications?: No  Social/Family/Support Systems Patient Roles: Spouse, Parent Contact Information: Lynne Takemoto, spouse Anticipated Caregiver: spouse and other family members Anticipated Caregiver's Contact Information: [phone] Ability/Limitations of Caregiver: spouse works for Father in [employer] Availability: 24/7 Discharge Plan Discussed with Primary Caregiver: Yes Is Caregiver In Agreement with Plan?: Yes Does Caregiver/Family have Issues with Lodging/Transportation while Pt is in Rehab?: No  Goals/Additional Needs Patient/Family Goal for Rehab: Mod I PT, OT, and SLP Expected length of stay: [facility]OS 7 to 10 days Pt/Family Agrees to Admission and willing to participate: Yes Program Orientation Provided & Reviewed with Pt/Caregiver Including Roles  & Responsibilities: Yes  Decrease burden of Care through IP rehab admission: n/a  Possible need for SNF placement upon discharge: not anticipated  Patient Condition:  The patient's medical and functional status has changed since the consult dated 01/03/2019 in which the Rehabilitation physician determined and documented that the patient's condition is appropriate for intensive rehabilitative care in an inpt rehab facility. See History of present Illness for medical update. Functional changes overall are min to mod assist. Patient's  medical and functional status have been discussed with the Rehabilitation physician and patient remains appropriate for the inpatient Rehabilitation admission.  We will plan admit 01/06/2019.  Preadmission Screen Completed By:   Cleatrice Burke RN MSN, 01/05/2019 2:04 PM ______________________________________________________________________   Discussed status with Dr. Naaman Plummer on 01/06/2019 at 0800 and received telephone approval for admission Saturday 01/06/2019  Admission Coordinator:  Cleatrice Burke RN MSN, time 0800 Date 01/06/2019

## 2019-01-05 NOTE — Progress Notes (Signed)
Physical Therapy Treatment Patient Details Name: [name] MRN: [id number] DOB: Nov 20, 1981 Today's Date: 01/05/2019    History of Present Illness Pt is a 38 y/o female with PMH of ADD, panic attacks, presented to ER for evaluation of episode of panic attack with reports of increased R sided neck pain and headache.  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 Continued workup.     PT Comments    Pt was sleeping upon arrival but agreed to participate with therapy with vitals remaining stable throughout session. Pt progressed ambulation distance with decrease assist from therapists. Pt ambulated with RW with improved balance and coordination with verbal cuing for correct use of RW. Pt is progressing well towards PT goals.  Pt would continue to benefit from skilled physical therapy to address mobility deficits.   Follow Up Recommendations  CIR     Equipment Recommendations  Other (comment)    Recommendations for Other Services Rehab consult     Precautions / Restrictions Precautions Precautions: Fall Restrictions Weight Bearing Restrictions: No    Mobility  Bed Mobility Overal bed mobility: Needs Assistance Bed Mobility: Rolling;Supine to Sit Rolling: Supervision   Supine to sit: Min guard;HOB elevated;+2 for safety/equipment        Transfers Overall transfer level: Needs assistance Equipment used: 2 person hand held assist;Rolling walker (2 wheeled) Transfers: Sit to/from Stand Sit to Stand: Min assist;+2 safety/equipment;+2 physical assistance         General transfer comment: min assist with HHA x2, attempted with RW with pt performing sit to stand with RW with min guard x2 for safety and requiring vc for hand placement  Ambulation/Gait Ambulation/Gait assistance: Min assist;+2 physical assistance;+2 safety/equipment Gait Distance (Feet): 300 Feet Assistive device: 2 person hand  held assist;Rolling walker (2 wheeled) Gait Pattern/deviations: Step-through pattern;Decreased stride length;Ataxic;Narrow base of support Gait velocity: decreased   General Gait Details: pt ambulated with occluded glasses and progressed to two person HHA min assist. Pt ambulated with RW to assess for improvement with balance and coordination and gait pattern improved with RW, pt required cuing for RW navigation. Pt required cuing to widen her base of support throughout ambulation. Husband followed with chair behind for safety. Pt continues to have impaired RLE coordination and motor planning during ambulation however it is improved since yesterday   Stairs             Wheelchair Mobility    Modified Rankin (Stroke Patients Only) Modified Rankin (Stroke Patients Only) Pre-Morbid Rankin Score: No symptoms Modified Rankin: Moderately severe disability     Balance Overall balance assessment: Needs assistance Sitting-balance support: Feet supported;No upper extremity supported Sitting balance-Leahy Scale: Fair Sitting balance - Comments: min guard for safety dynamically    Standing balance support: Bilateral upper extremity supported Standing balance-Leahy Scale: Poor Standing balance comment: reliant on UE and external support                            Cognition Arousal/Alertness: Lethargic Behavior During Therapy: Flat affect Overall Cognitive Status: Impaired/Different from baseline Area of Impairment: Attention;Following commands                   Current Attention Level: Sustained Memory: Decreased recall of precautions;Decreased short-term memory Following Commands: Follows one step commands consistently;Follows one step commands with increased time Safety/Judgement: Decreased awareness of safety;Decreased awareness of deficits Awareness: Emergent Problem Solving:  Slow processing;Decreased initiation;Difficulty sequencing;Requires verbal  cues;Requires tactile cues        Exercises Total Joint Exercises Long Arc Quad: AROM;15 reps;Both;Seated General Exercises - Lower Extremity Hip Flexion/Marching: AROM;20 reps;Both;Seated    General Comments        Pertinent Vitals/Pain Pain Assessment: No/denies pain  Vital signs: BP 141/94 start of session, 180/88 end of session. HR: 86, SpO2 95% on room air    Home Living                      Prior Function            PT Goals (current goals can now be found in the care plan section) Progress towards PT goals: Progressing toward goals    Frequency    Min 4X/week      PT Plan Current plan remains appropriate    Co-evaluation              AM-PAC PT "6 Clicks" Mobility   Outcome Measure  Help needed turning from your back to your side while in a flat bed without using bedrails?: A Little Help needed moving from lying on your back to sitting on the side of a flat bed without using bedrails?: A Little Help needed moving to and from a bed to a chair (including a wheelchair)?: A Lot Help needed standing up from a chair using your arms (e.g., wheelchair or bedside chair)?: A Lot Help needed to walk in hospital room?: A Lot Help needed climbing 3-5 steps with a railing? : A Lot 6 Click Score: 14    End of Session Equipment Utilized During Treatment: Gait belt Activity Tolerance: Patient tolerated treatment well Patient left: in chair;with chair alarm set;with call bell/phone within reach;with family/visitor present Nurse Communication: Mobility status PT Visit Diagnosis: Unsteadiness on feet (R26.81);Ataxic gait (R26.0);Other symptoms and signs involving the nervous system (R29.898);Hemiplegia and hemiparesis Hemiplegia - Right/Left: Right Hemiplegia - dominant/non-dominant: Dominant Hemiplegia - caused by: Other cerebrovascular disease     Time: 0927-0957 PT Time Calculation (min) (ACUTE ONLY): 30 min  Charges:  $Gait Training: 8-22  mins $Therapeutic Exercise: 8-22 mins                     Zachary George, [place] [phone]    Nickie Warwick 01/05/2019, 10:48 AM

## 2019-01-05 NOTE — Progress Notes (Signed)
Overall stable.  Patient wide awake and interactive.  Complains of some mild right posterior occipital headache.  No nausea no vomiting.  Vision the same.  Still has some double vision prickly with attempting to look toward the right.  Awake and alert.  Oriented and appropriate.  Motor 5/5 bilaterally.  Sensory exam intact.  Still with some dysmetria right worse than left.  Follow-up head CT scan demonstrates evolution of a right cerebellar infarct.  There is some slightly worsened compression of her fourth ventricle.  Her ventricles are slightly larger but not significantly so.  Overall stable.  Patient may be developing some mild early hydrocephalus.  Continue to follow her clinical exam.  Continue efforts at mobilization.

## 2019-01-05 NOTE — Progress Notes (Signed)
Inpatient Rehabilitation Admissions Coordinator  I met with Dr. Leonie Man to discuss. Plans to repeat CT scan at 12 noon. Depending on results, may d/c to inpt rehab today. I met with patient and her spouse at bedside and they are in agreement to admit to CIR pending scan results. I have insurance approval and bed available today. I will follow up after scan.  Danne Baxter, RN, MSN Rehab Admissions Coordinator [phone] 01/05/2019 11:14 AM

## 2019-01-06 ENCOUNTER — Inpatient Hospital Stay (HOSPITAL_COMMUNITY)
Admission: RE | Admit: 2019-01-06 | Discharge: 2019-01-16 | DRG: 057 | Disposition: A | Discharge status: Home Health | Payer: 59 | Source: Intra-hospital | Attending: Physical Medicine & Rehabilitation | Admitting: Physical Medicine & Rehabilitation

## 2019-01-06 ENCOUNTER — Other Ambulatory Visit: Payer: Self-pay

## 2019-01-06 ENCOUNTER — Encounter (HOSPITAL_COMMUNITY): Payer: Self-pay | Admitting: *Deleted

## 2019-01-06 DIAGNOSIS — I10 Essential (primary) hypertension: Secondary | ICD-10-CM | POA: Diagnosis present

## 2019-01-06 DIAGNOSIS — I7774 Dissection of vertebral artery: Secondary | ICD-10-CM | POA: Diagnosis present

## 2019-01-06 DIAGNOSIS — I69354 Hemiplegia and hemiparesis following cerebral infarction affecting left non-dominant side: Secondary | ICD-10-CM | POA: Diagnosis not present

## 2019-01-06 DIAGNOSIS — J45990 Exercise induced bronchospasm: Secondary | ICD-10-CM | POA: Diagnosis present

## 2019-01-06 DIAGNOSIS — I69398 Other sequelae of cerebral infarction: Secondary | ICD-10-CM | POA: Diagnosis not present

## 2019-01-06 DIAGNOSIS — K5900 Constipation, unspecified: Secondary | ICD-10-CM | POA: Diagnosis present

## 2019-01-06 DIAGNOSIS — I639 Cerebral infarction, unspecified: Secondary | ICD-10-CM

## 2019-01-06 DIAGNOSIS — F410 Panic disorder [episodic paroxysmal anxiety] without agoraphobia: Secondary | ICD-10-CM | POA: Diagnosis present

## 2019-01-06 DIAGNOSIS — H532 Diplopia: Secondary | ICD-10-CM | POA: Diagnosis present

## 2019-01-06 DIAGNOSIS — F909 Attention-deficit hyperactivity disorder, unspecified type: Secondary | ICD-10-CM | POA: Diagnosis present

## 2019-01-06 DIAGNOSIS — R270 Ataxia, unspecified: Secondary | ICD-10-CM | POA: Diagnosis not present

## 2019-01-06 DIAGNOSIS — Z809 Family history of malignant neoplasm, unspecified: Secondary | ICD-10-CM | POA: Diagnosis not present

## 2019-01-06 DIAGNOSIS — K5901 Slow transit constipation: Secondary | ICD-10-CM | POA: Diagnosis not present

## 2019-01-06 DIAGNOSIS — F411 Generalized anxiety disorder: Secondary | ICD-10-CM | POA: Diagnosis not present

## 2019-01-06 DIAGNOSIS — R269 Unspecified abnormalities of gait and mobility: Secondary | ICD-10-CM | POA: Diagnosis not present

## 2019-01-06 MED ORDER — ACETAMINOPHEN 650 MG RE SUPP: 650.0000 mg | RECTAL | Status: DC | PRN

## 2019-01-06 MED ORDER — ASPIRIN EC 325 MG PO TBEC
325.0000 mg | DELAYED_RELEASE_TABLET | Freq: Every day | ORAL | Status: DC
  Administered 2019-01-07 – 2019-01-16 (×10): 325 mg via ORAL
  Filled 2019-01-06 (×10): qty 1

## 2019-01-06 MED ORDER — SENNOSIDES-DOCUSATE SODIUM 8.6-50 MG PO TABS
1.0000 | ORAL_TABLET | Freq: Every evening | ORAL | Status: DC | PRN
  Administered 2019-01-06: 1 via ORAL
  Filled 2019-01-06: qty 1

## 2019-01-06 MED ORDER — ACETAMINOPHEN 160 MG/5ML PO SOLN: 650.0000 mg | ORAL | Status: DC | PRN

## 2019-01-06 MED ORDER — ACETAMINOPHEN 325 MG PO TABS
650.0000 mg | ORAL_TABLET | ORAL | Status: DC | PRN
  Administered 2019-01-06 – 2019-01-15 (×23): 650 mg via ORAL
  Filled 2019-01-06 (×24): qty 2

## 2019-01-06 MED ORDER — FLEET ENEMA 7-19 GM/118ML RE ENEM: 1.0000 | ENEMA | Freq: Every day | RECTAL | Status: DC | PRN

## 2019-01-06 MED ORDER — SERTRALINE HCL 100 MG PO TABS
100.0000 mg | ORAL_TABLET | Freq: Every day | ORAL | Status: DC
  Administered 2019-01-06 – 2019-01-15 (×10): 100 mg via ORAL
  Filled 2019-01-06 (×10): qty 1

## 2019-01-06 MED ORDER — BISACODYL 10 MG RE SUPP
10.0000 mg | Freq: Every day | RECTAL | Status: DC | PRN
  Administered 2019-01-07: 10 mg via RECTAL
  Filled 2019-01-06: qty 1

## 2019-01-06 MED ORDER — SORBITOL 70 % SOLN
60.0000 mL | Status: AC
  Administered 2019-01-06: 60 mL via ORAL
  Filled 2019-01-06: qty 60

## 2019-01-06 MED ORDER — SORBITOL 70 % SOLN
30.0000 mL | Freq: Every day | Status: DC | PRN
  Administered 2019-01-09: 30 mL via ORAL
  Filled 2019-01-06: qty 30

## 2019-01-06 NOTE — Progress Notes (Signed)
Subjective: The patient is alert and pleasant.  Her aunt is at the bedside.  She has some mild diplopia but this has improved.  Objective: Vital signs in last 24 hours: Temp:  [97.7 F (36.5 C)-98.5 F (36.9 C)] 97.7 F (36.5 C) (02/15 0800) Pulse Rate:  [54-86] 65 (02/15 0700) Resp:  [10-23] 10 (02/15 0700) BP: (127-161)/(71-117) 142/110 (02/15 0700) SpO2:  [94 %-100 %] 95 % (02/15 0700) Estimated body mass index is 32.61 kg/m as calculated from the following:   Height as of this encounter: 5\' 4"  (1.626 m).   Weight as of this encounter: 86.2 kg.   Intake/Output from previous day: 02/14 0701 - 02/15 0700 In: -  Out: 1025 [Urine:1025] Intake/Output this shift: No intake/output data recorded.  Physical exam the patient is alert and oriented x3.  She is moving all 4 extremities well.  Extraocular muscle exam is grossly normal.  Lab Results: No results for input(s): WBC, HGB, HCT, PLT in the last 72 hours. BMET Recent Labs    01/04/19 0408 01/04/19 1159  NA 140 140    Studies/Results: Ct Head Wo Contrast  Result Date: 01/05/2019 CLINICAL DATA:  Stroke follow-up EXAM: CT HEAD WITHOUT CONTRAST TECHNIQUE: Contiguous axial images were obtained from the base of the skull through the vertex without intravenous contrast. COMPARISON:  01/03/2019 FINDINGS: Brain: Cytotoxic edema within the right cerebellum has worsened with greater leftward shift of the midline posterior fossa structures and worsened mass effect on the cerebral aqueduct. There is early obstructive hydrocephalus with increased size of the lateral and third ventricles compared to the prior study. There is no acute hemorrhage. Slight worsening of edema within the right thalamus. Unchanged appearance of occipital infarcts. Unchanged appearance of left thalamus. Vascular: No abnormal hyperdensity of the major intracranial arteries or dural venous sinuses. No intracranial atherosclerosis. Skull: The visualized skull base,  calvarium and extracranial soft tissues are normal. Sinuses/Orbits: No fluid levels or advanced mucosal thickening of the visualized paranasal sinuses. No mastoid or middle ear effusion. The orbits are normal. IMPRESSION: 1. 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 2. Otherwise expected evolution of thalamic and bilateral occipital lobe infarcts without acute hemorrhage. Electronically Signed   By: Ulyses Jarred M.D.   On: 01/05/2019 14:16    Assessment/Plan: Right cerebellar stroke, mild hydrocephalus: The patient is doing well clinically and seems to be ready for rehab.  LOS: 4 days     Ophelia Charter 01/06/2019, 8:38 AM

## 2019-01-06 NOTE — H&P (Signed)
Physical Medicine and Rehabilitation Admission H&P         Chief Complaint  Patient presents with  . Anxiety      panic   : HPI: [name] is a 38 year old right-handed female with history of ADHD maintained on Adderall and Xanax, panic attacks, exercise-induced asthma. Per chart review and patient, patient lives with spouse. Independent prior to admission. Two-level home 5 steps to entry and bedroom on main level. She has 4 children ages 48 through 77. Family planning assistance as needed. Presented to 01/02/2019 with lethargy, dizziness and anxiety with bouts of nausea as well as headache and neck pain. Patient could not recall any type of fall or neck hyperextension injury prior to admission. 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 Associated trace petechial hemorrhage in the occipital poles without frank hemorrhagic conversion. 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 Probable distal bilateral P3-P4 occlusion. Urine drug screen positive for amphetamines. MRI the brain showed multifocal areas of acute infarction, predominantly affecting the right cerebellum reflecting right vertebral dissection but also areas of acute infarction elsewhere in the posterior circulation notably both thalami, left temporal lobe. Noted significant mass effect on the outlet foramina the fourth ventricle without current features of hydrocephalus. Neurosurgery follow-up currently with conservative care.  Repeat CT reviewed, scan with slightly worsening compression of fourth ventricle, read pending.  Patient completed a course of 3% normal saline. Maintain on aspirin for CVA prophylaxis. Tolerating a regular diet. Therapy evaluations completed with recommendations of physical  medicine rehabilitation consult. Patient was admitted for a comprehensive rehabilitation program. Patient was planned to be admitted today, however, due to increase in fluid on CT, will hold per Neurology and monitor overnight.    Review of Systems  Constitutional: Negative for fever.  HENT: Negative for hearing loss.   Eyes: Positive for double vision. Negative for blurred vision.  Respiratory: Negative for cough.        Shortness of breath with exertion  Cardiovascular: Negative for chest pain, palpitations and leg swelling.  Gastrointestinal: Positive for constipation. Negative for nausea and vomiting.  Genitourinary: Negative for dysuria, flank pain and hematuria.  Musculoskeletal: Positive for neck pain.  Skin: Negative for rash.  Neurological: Positive for dizziness and headaches.  Psychiatric/Behavioral: Negative for depression.       Panic attacks  All other systems reviewed and are negative.       Past Medical History:  Diagnosis Date  . ADD (attention deficit disorder)    . Exercise-induced asthma    . Hx of varicella    . Kidney stones    . S/P cesarean section 05/05/2016  . S/P tubal ligation 05/05/2016  . Term pregnancy, repeat 05/04/2016         Past Surgical History:  Procedure Laterality Date  . BACK SURGERY        scoliosis age 81  . BILATERAL SALPINGECTOMY Bilateral 05/05/2016    Procedure: BILATERAL SALPINGECTOMY;  Surgeon: Janyth Contes, MD;  Location: [facility];  Service: Obstetrics;  Laterality: Bilateral;  . CESAREAN SECTION      . CESAREAN SECTION N/A 05/05/2016    Procedure: CESAREAN SECTION;  Surgeon: Janyth Contes, MD;  Location: [facility];  Service: Obstetrics;  Laterality: N/A;  .  CHOLECYSTECTOMY      . HAND SURGERY      . HERNIA REPAIR      . KIDNEY STONE SURGERY        R--surgery; L lithotripsy.  Dr. Gaynelle Arabian         Family History  Problem Relation Age of Onset  . Cancer Mother    . Cancer Maternal Aunt      . Cancer Maternal Grandmother      Social History:  reports that she has never smoked. She has never used smokeless tobacco. She reports that she does not drink alcohol or use drugs. Allergies: No Known Allergies       Medications Prior to Admission  Medication Sig Dispense Refill  . [START ON 01/10/2019] ADDERALL XR 20 MG 24 hr capsule TAKE 1 CAPSULE BY MOUTH 2 TIMES DAILY. EFFECTIVE 08/14/18 (Patient taking differently: Take 20 mg by mouth 2 (two) times daily. ) 60 capsule 0  . amphetamine-dextroamphetamine (ADDERALL) 10 MG tablet TAKE 1 TABLET (10 MG TOTAL) BY MOUTH DAILY. 30 tablet 0  . ibuprofen (ADVIL,MOTRIN) 200 MG tablet Take 800 mg by mouth every 6 (six) hours as needed for moderate pain.      Marland Kitchen sertraline (ZOLOFT) 100 MG tablet Take 100 mg by mouth at bedtime.       . ALPRAZolam (XANAX) 0.25 MG tablet Take 1 tablet (0.25 mg total) by mouth 2 (two) times daily as needed for anxiety. (Patient not taking: Reported on 11/02/2018) 20 tablet 0  . amoxicillin (AMOXIL) 875 MG tablet Take 1 tablet (875 mg total) by mouth 2 (two) times daily. (Patient not taking: Reported on 01/02/2019) 20 tablet 0  . amphetamine-dextroamphetamine (ADDERALL XR) 20 MG 24 hr capsule Take 1 capsule (20 mg total) by mouth 2 (two) times daily. (Patient not taking: Reported on 01/02/2019) 60 capsule 0  . amphetamine-dextroamphetamine (ADDERALL XR) 20 MG 24 hr capsule Take 1 capsule (20 mg total) by mouth 2 (two) times daily. (Patient not taking: Reported on 01/02/2019) 60 capsule 0      Drug Regimen Review Drug regimen was reviewed and remains appropriate with no significant issues identified   Home: Home Living Family/patient expects to be discharged to:: Private residence Living Arrangements: Spouse/significant other, Children Available Help at Discharge: Family, Available 24 hours/day Type of Home: House Home Access: Stairs to enter Technical brewer of Steps: 5 Home Layout: Two level Alternate Level  Stairs-Number of Steps: flight Bathroom Shower/Tub: Multimedia programmer: Standard Home Equipment: None  Lives With: Spouse(4 kids)   Functional History: Prior Function Level of Independence: Independent Comments: 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  Needed cues to slow down due to impulsivitiy.  Transfers Overall transfer level: Needs assistance Equipment used: 2 person hand held assist Transfers: Sit to/from Stand Sit to Stand: Min assist, +2 safety/equipment, +2 physical assistance General transfer comment: min assist to power up and steady with +2, B UE hand support  required Ambulation/Gait Ambulation/Gait assistance: Mod assist, +2 physical assistance, +2 safety/equipment Gait Distance (Feet): 250 Feet(30 feet, sitting rest, then 220 feet) Assistive device: 2 person hand held assist Gait Pattern/deviations: Step-through pattern, Ataxic, Narrow base of support General Gait Details: pt with  significantly impaired R LE co-ordination, motor planning and sequencing. Pt with ataxic, occasionaly scissoring stepping pattern, pt reports dizziness most likely due to cerebellar involvement.  Pt wore the occluded glasses which seemed to help balance some. Still reports double vision.   Gait velocity: decreased Gait velocity interpretation: <1.8 ft/sec, indicate of risk for recurrent falls   ADL: ADL Overall ADL's : Needs assistance/impaired Grooming: Wash/dry hands, Minimal assistance, Standing(+2 safety standing ) Upper Body Bathing: Minimal assistance, Sitting Lower Body Bathing: Moderate assistance, Sit to/from stand, +2 for  safety/equipment, +2 for physical assistance Upper Body Dressing : Moderate assistance, Sitting Lower Body Dressing: Moderate assistance, +2 for physical assistance, +2 for safety/equipment, Sit to/from stand Toilet Transfer: Minimal assistance, Moderate assistance, +2 for physical assistance, +2 for safety/equipment, Ambulation, Grab bars, Regular Toilet Toilet Transfer Details (indicate cue type and reason): patient requires +2 for safety, mod assist during ambulation but min assist for transfers  Toileting- Clothing Manipulation and Hygiene: Moderate assistance, +2 for physical assistance, +2 for safety/equipment, Sit to/from stand Toileting - Clothing Manipulation Details (indicate cue type and reason): assist with clothing mgmt down and steadying assist for 0 hand support to manage clohting up  Functional mobility during ADLs: Moderate assistance, +2 for physical assistance, +2 for safety/equipment, Cueing for safety, Cueing for sequencing   Cognition: Cognition Overall Cognitive Status: Impaired/Different from baseline Arousal/Alertness: Awake/alert Orientation Level: Oriented X4 Attention: Alternating, Sustained Sustained Attention: Appears intact Alternating Attention: Impaired Memory: Appears intact Awareness: Appears intact Problem Solving: Appears intact Executive Function: Organizing, Self Correcting Organizing: Impaired Organizing Impairment: Functional complex, Verbal complex Self Correcting: Impaired Self Correcting Impairment: Functional complex, Verbal complex Safety/Judgment: (unable to assess) Cognition Arousal/Alertness: Lethargic Behavior During Therapy: Flat affect Overall Cognitive Status: Impaired/Different from baseline Area of Impairment: Attention, Following commands Orientation Level: Time, Situation Current Attention Level: Sustained Memory: Decreased recall of precautions, Decreased short-term memory Following Commands: Follows one step commands  consistently, Follows one step commands with increased time Safety/Judgement: Decreased awareness of safety, Decreased awareness of deficits Awareness: Emergent Problem Solving: Slow processing, Decreased initiation, Difficulty sequencing, Requires verbal cues, Requires tactile cues General Comments: pt with very impaired co-ordination, delayed processing    Physical Exam: Blood pressure (!) 151/79, pulse 61, temperature 98.4 F (36.9 C), temperature source Oral, resp. rate 11, height 5\' 4"  (1.626 m), weight 86.2 kg, last menstrual period 12/17/2018, SpO2 95 %. Physical Exam  Constitutional: She is oriented to person, place, and time. She appears well-developed.  Obese  HENT:  Head: Normocephalic and atraumatic.  Eyes: Right eye exhibits no discharge. Left eye exhibits no discharge.  Neck: Normal range of motion. Neck supple.  Cardiovascular: Normal rate and regular rhythm. Exam reveals no friction rub.  No murmur heard. Respiratory: Effort normal and breath sounds normal.  GI: Soft. Bowel sounds are normal. She exhibits no distension. There is no abdominal tenderness.  Musculoskeletal: Normal range of motion.        General: No tenderness, deformity or edema.     Comments:    Neurological: She is alert and oriented to person, place, and time.  Improved insight and awareness. Moves all 4's. Tracks to all fields all though right eye lags slightly behind left during tracking. 2-3 beats of nystagmus with lateral gaze left and right. No obvious CN abnl. Complains of more diplopia with gaze to right. Strength 4/5 RUE 5/5 LUE , RLE 4 to 4+/5. LLE 5/5. Decreased fmc RUE more than left with dysmetria and mild ataxia. Senses pain in all  4's. DTR's are brisk and 2-3+ in all 4     Skin: Skin is warm and dry.  Psychiatric: She has a normal mood and affect. Her behavior is normal. Thought content normal.      Lab Results Last 48 Hours        Results for orders placed or performed during the  hospital encounter of 01/02/19 (from the past 48 hour(s))  Sodium     Status: None    Collection Time: 01/03/19  9:46 AM  Result Value Ref Range    Sodium 141 135 - 145 mmol/L      Comment: Performed at [facility], [address], [place], [place] [address]  Sodium     Status: None    Collection Time: 01/03/19  4:00 PM  Result Value Ref Range    Sodium 141 135 - 145 mmol/L      Comment: Performed at [facility] [address], [place], [place] [address]  Sodium     Status: None    Collection Time: 01/03/19 10:55 PM  Result Value Ref Range    Sodium 141 135 - 145 mmol/L      Comment: Performed at [facility], [place] [address] [place], [place] [address]  Sodium     Status: None    Collection Time: 01/04/19  4:08 AM  Result Value Ref Range    Sodium 140 135 - 145 mmol/L      Comment: Performed at [facility] [address], [place], [place] [address]  Sodium     Status: None    Collection Time: 01/04/19 11:59 AM  Result Value Ref Range    Sodium 140 135 - 145 mmol/L      Comment: Performed at [facility] [address] [place], [place] [address]       Imaging Results (Last 48 hours)  Mr Brain Wo Contrast   Result Date: 01/03/2019 CLINICAL DATA:  Follow-up vertebral dissection.  Dizziness. EXAM: MRI HEAD WITHOUT CONTRAST TECHNIQUE: Multiplanar, multiecho pulse sequences of the brain and surrounding structures were obtained without intravenous contrast. COMPARISON:  CT head and CTA head neck 01/02/2019, 01/03/2019. FINDINGS: Brain: Large area of restricted diffusion affects much of the RIGHT cerebellum, portion of the RIGHT PICA territory, RIGHT AICA, and RIGHT SCA vascular distributions. Additional areas of restricted diffusion involve the RIGHT occipital lobe, RIGHT thalamus, LEFT thalamus, and LEFT posterior temporal lobe. Mild mass effect on the brainstem. No definite hemorrhagic transformation. Effacement of the fourth ventricular outlet  foramina, and RIGHT-to-LEFT displacement of the fourth ventricle, without frank hydrocephalus. Brain otherwise normal. Vascular: Asymmetric abnormal flow void in the RIGHT vertebral reflects the dissection, seen better on CTA. Skull and upper cervical spine: Normal marrow signal. No tonsillar herniation. Sinuses/Orbits: No sinus or orbital disease. Other: None. IMPRESSION: Multifocal areas of acute infarction, predominantly affecting the RIGHT cerebellum reflecting RIGHT vertebral dissection, but also areas of acute infarction elsewhere in the posterior circulation, notably both thalami, LEFT temporal lobe, and RIGHT occipital lobe reflecting thalamoperforate and BILATERAL PCA territory ischemia, consistent with distal emboli. Abnormal flow void RIGHT vertebral reflecting the previously identified dissection. No features to suggest hemorrhagic transformation. Significant mass effect on the outlet foramina the fourth ventricle without current features of hydrocephalus. Close continued surveillance is warranted. Electronically Signed   By: Staci Righter M.D.   On: 01/03/2019 14:27             Medical Problem  List and Plan: 1.  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  DVT Prophylaxis/Anticoagulation: SCDs. Monitor for any signs of DVT 3. Pain Management: Tylenol as needed             Consider adjustments to medications as necessary 4. Mood/ADD/panic attacks:  Zoloft 100 mg daily at bedtime             -team and family to provide ego support             -family in room and appear very supportive 5. Neuropsych: This patient is  capable of making decisions on her own behalf. 6. Skin/Wound Care:  Routine skin checks 7. Fluids/Electrolytes/Nutrition:  Routine in and out's with follow-up  chemistries             -encourage PO 8.  Elevated blood pressure: Monitor with increased mobility 9. Constipation: augment bowel regimen             -sorbitol on admit,              -dulcolax supp, fleet enema if needed   Elizabeth Sauer 01/05/2019  Post Admission Physician Evaluation: 1. Functional deficits secondary  to right cerebellar/bilateral PCA distribution infarcts due to right vertebral artery dissection. 2. Patient is admitted to receive collaborative, interdisciplinary care between the physiatrist, rehab nursing staff, and therapy team. 3. Patient's level of medical complexity and substantial therapy needs in context of that medical necessity cannot be provided at a lesser intensity of care such as a SNF. 4. Patient has experienced substantial functional loss from his/her baseline which was documented above under the "Functional History" and "Functional Status" headings.  Judging by the patient's diagnosis, physical exam, and functional history, the patient has potential for functional progress which will result in measurable gains while on inpatient rehab.  These gains will be of substantial and practical use upon discharge  in facilitating mobility and self-care at the household level. 5. Physiatrist will provide 24 hour management of medical needs as well as oversight of the therapy plan/treatment and provide guidance as appropriate regarding the interaction of the two. 6. The Preadmission Screening has been reviewed and patient status is unchanged unless otherwise stated above. 7. 24 hour rehab nursing will assist with bladder management, bowel management, safety, skin/wound care, disease management, medication administration, pain management and patient education  and help integrate therapy concepts, techniques,education, etc. 8. PT will assess and treat for/with: Lower extremity strength, range of motion, stamina, balance, functional mobility, safety, adaptive techniques  and equipment, NMR, vestibular rx, family ed.   Goals are: mod I. 9. OT will assess and treat for/with: ADL's, functional mobility, safety, upper extremity strength, adaptive techniques and equipment, NMR, vestibular rx, family ed.   Goals are: mod I. Therapy may proceed with showering this patient. 10. SLP will assess and treat for/with: cognition, family education.  Goals are: mod I. 11. Case Management and Social Worker will assess and treat for psychological issues and discharge planning. 12. Team conference will be held weekly to assess progress toward goals and to determine barriers to discharge. 13. Patient will receive at least 3 hours of therapy per day at least 5 days per week. 14.  ELOS: 7-11 days       15. Prognosis:  excellent   I have personally performed a face to face diagnostic evaluation of this patient and formulated the key components of the plan.  Additionally, I have personally reviewed laboratory data, imaging studies, as well as relevant notes and concur with the physician assistant's documentation above.  Meredith Staggers, MD, [facility]rown

## 2019-01-06 NOTE — Progress Notes (Signed)
Patient arrived with Nurse and nurse tech to the floor and all personal belongings. Patient's husband also by patients side. Patient oriented to the floor and call light in patients reach.  Nicholes Rough, LPN

## 2019-01-06 NOTE — Discharge Summary (Addendum)
Patient ID: [name]   MRN: [id number]      DOB: 18-Dec-1980  Date of Admission: 01/02/2019 Date of Discharge: 01/06/2019  Attending Physician:  Garvin Fila, MD, Stroke MD Consultant(s):  Treatment Team:  Earnie Larsson, MD (Neuro Surgery - Dr Annette Stable) (Rehabilitation - Dr Letta Pate) Patient's PCP:  Denita Lung, MD  DISCHARGE DIAGNOSIS: Acute right vertebral artery dissection -> Multifocal areas of acute infarction in posterior circulation Active Problems:   CVA (cerebral vascular accident) ([place])   Acute ischemic stroke ([place])   Vertebral artery dissection ([place])   Elevated blood pressure reading   Major depressive disorder   Panic disorder  Past Medical History:  Diagnosis Date  . ADD (attention deficit disorder)   . Exercise-induced asthma   . Hx of varicella   . Kidney stones   . S/P cesarean section 05/05/2016  . S/P tubal ligation 05/05/2016  . Term pregnancy, repeat 05/04/2016   Past Surgical History:  Procedure Laterality Date  . BACK SURGERY     scoliosis age 26  . BILATERAL SALPINGECTOMY Bilateral 05/05/2016   Procedure: BILATERAL SALPINGECTOMY;  Surgeon: Janyth Contes, MD;  Location: [facility];  Service: Obstetrics;  Laterality: Bilateral;  . CESAREAN SECTION    . CESAREAN SECTION N/A 05/05/2016   Procedure: CESAREAN SECTION;  Surgeon: Janyth Contes, MD;  Location: [facility];  Service: Obstetrics;  Laterality: N/A;  . CHOLECYSTECTOMY    . HAND SURGERY    . HERNIA REPAIR    . KIDNEY STONE SURGERY     R--surgery; L lithotripsy.  Dr. Shelda Jakes MEDICATIONS . aspirin EC  325 mg Oral Daily  . sertraline  100 mg Oral QHS    HOME MEDICATIONS PRIOR TO ADMISSION Medications Prior to Admission  Medication Sig Dispense Refill  . [START ON 01/10/2019] ADDERALL XR 20 MG 24 hr capsule TAKE 1 CAPSULE BY MOUTH 2 TIMES DAILY. EFFECTIVE 08/14/18 (Patient taking differently: Take 20 mg by mouth 2 (two) times daily. ) 60 capsule  0  . amphetamine-dextroamphetamine (ADDERALL) 10 MG tablet TAKE 1 TABLET (10 MG TOTAL) BY MOUTH DAILY. 30 tablet 0  . ibuprofen (ADVIL,MOTRIN) 200 MG tablet Take 800 mg by mouth every 6 (six) hours as needed for moderate pain.    Marland Kitchen sertraline (ZOLOFT) 100 MG tablet Take 100 mg by mouth at bedtime.     . ALPRAZolam (XANAX) 0.25 MG tablet Take 1 tablet (0.25 mg total) by mouth 2 (two) times daily as needed for anxiety. (Patient not taking: Reported on 11/02/2018) 20 tablet 0  . amoxicillin (AMOXIL) 875 MG tablet Take 1 tablet (875 mg total) by mouth 2 (two) times daily. (Patient not taking: Reported on 01/02/2019) 20 tablet 0  . amphetamine-dextroamphetamine (ADDERALL XR) 20 MG 24 hr capsule Take 1 capsule (20 mg total) by mouth 2 (two) times daily. (Patient not taking: Reported on 01/02/2019) 60 capsule 0  . amphetamine-dextroamphetamine (ADDERALL XR) 20 MG 24 hr capsule Take 1 capsule (20 mg total) by mouth 2 (two) times daily. (Patient not taking: Reported on 01/02/2019) 60 capsule 0    LABORATORY STUDIES CBC    Component Value Date/Time   WBC 13.2 (H) 01/02/2019 0903   RBC 4.69 01/02/2019 0903   HGB 13.9 01/02/2019 0903   HCT 42.4 01/02/2019 0903   PLT 295 01/02/2019 0903   MCV 90.4 01/02/2019 0903   MCH 29.6 01/02/2019 0903   MCHC 32.8 01/02/2019 0903   RDW 13.4 01/02/2019  0903   LYMPHSABS 1.6 01/02/2019 0903   MONOABS 0.8 01/02/2019 0903   EOSABS 0.1 01/02/2019 0903   BASOSABS 0.1 01/02/2019 0903   CMP    Component Value Date/Time   NA 140 01/04/2019 1159   K 3.3 (L) 01/02/2019 0903   CL 108 01/02/2019 0903   CO2 20 (L) 01/02/2019 0903   GLUCOSE 179 (H) 01/02/2019 0903   BUN 11 01/02/2019 0903   CREATININE 0.63 01/02/2019 0903   CREATININE 0.68 11/26/2011 1519   CALCIUM 8.2 (L) 01/02/2019 0903   PROT 7.5 01/02/2019 0903   ALBUMIN 4.3 01/02/2019 0903   AST 29 01/02/2019 0903   ALT 23 01/02/2019 0903   ALKPHOS 73 01/02/2019 0903   BILITOT 0.1 (L) 01/02/2019 0903    GFRNONAA >60 01/02/2019 0903   GFRAA >60 01/02/2019 0903   COAGSNo results found for: INR, PROTIME Lipid Panel    Component Value Date/Time   CHOL 160 01/03/2019 0440   TRIG 74 01/03/2019 0440   HDL 57 01/03/2019 0440   CHOLHDL 2.8 01/03/2019 0440   VLDL 15 01/03/2019 0440   LDLCALC 88 01/03/2019 0440   HgbA1C  Lab Results  Component Value Date   HGBA1C 5.7 (H) 01/03/2019   Urinalysis    Component Value Date/Time   COLORURINE YELLOW 01/02/2019 1923   APPEARANCEUR HAZY (A) 01/02/2019 1923   LABSPEC 1.014 01/02/2019 1923   PHURINE 6.0 01/02/2019 1923   GLUCOSEU 150 (A) 01/02/2019 1923   HGBUR MODERATE (A) 01/02/2019 1923   BILIRUBINUR NEGATIVE 01/02/2019 1923   BILIRUBINUR neg 10/09/2012 1345   KETONESUR NEGATIVE 01/02/2019 1923   PROTEINUR NEGATIVE 01/02/2019 1923   UROBILINOGEN 0.2 08/26/2014 1110   NITRITE NEGATIVE 01/02/2019 1923   LEUKOCYTESUR NEGATIVE 01/02/2019 1923   Urine Drug Screen     Component Value Date/Time   LABOPIA NONE DETECTED 01/02/2019 1923   COCAINSCRNUR NONE DETECTED 01/02/2019 1923   LABBENZ NONE DETECTED 01/02/2019 1923   AMPHETMU POSITIVE (A) 01/02/2019 1923   THCU NONE DETECTED 01/02/2019 1923   LABBARB NONE DETECTED 01/02/2019 1923    Alcohol Level    Component Value Date/Time   ETH <10 01/02/2019 1923     SIGNIFICANT DIAGNOSTIC STUDIES  Ct Angio Head W Or Wo Contrast 01/02/2019 IMPRESSION:   CT HEAD  IMPRESSION  1. 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 Associated trace petechial hemorrhage at the occipital poles without frank hemorrhagic transformation.  2. Associated mass effect within the right posterior fossa with partial effacement of the right basilar cisterns and fourth ventricle. No obstructive hydrocephalus at this time.   CTA HEAD AND NECK  IMPRESSION  1. Acute right vertebral artery dissection, involving  the right V3 segment at the level of the right C1 transverse foramen, with probable small dissection flap just distally at the right V3/V4 junction. Associated moderate stenosis.  2. 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  3. Otherwise negative   CTA of the head and neck.  Normal left vertebral artery. 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    Ct Head Wo Contrast 01/05/2019 IMPRESSION:  1. 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  2. Otherwise expected evolution of thalamic and bilateral occipital lobe infarcts without acute hemorrhage.   Ct Head Wo Contrast 01/03/2019  IMPRESSION:  1. Unchanged appearance of posterior circulation infarcts involving the right cerebellum, both thalami and both occipital lobes.  2. Unchanged posterior fossa edema with narrowing of the basal cisterns and fourth ventricle without hydrocephalus.     Mr Brain Wo Contrast 01/03/2019 IMPRESSION:  Multifocal areas of acute infarction, predominantly affecting the RIGHT cerebellum reflecting RIGHT vertebral dissection, but also areas of acute infarction elsewhere in the posterior circulation, notably both thalami, LEFT temporal lobe, and RIGHT occipital lobe reflecting thalamoperforate and BILATERAL PCA territory ischemia, consistent with distal emboli. Abnormal flow void RIGHT vertebral reflecting the previously identified dissection. No features to suggest hemorrhagic transformation. Significant mass effect on the outlet foramina the fourth ventricle without current features of hydrocephalus. Close continued surveillance is warranted.      HISTORY OF PRESENT ILLNESS (from Dr Johny Chess H&P 01/02/2019) [name] is a 38 y.o. female past medical history of ADD and panic attacks, presented to the  emergency room and was seen on hospital for evaluation of an episode of panic attack.  She was seen in the emergency room this morning and resting on hospital after EMS was called because of her having feelings of anxiety.  EMS gave her 5 mg of Haldol in addition to 4 mg of Zofran and she also complained of nausea.  She was unable to provide much history. According to the husband, she had been complaining of days to weeks of right-sided neck pain.  No other focal deficits were noted by the family.  This morning she woke up with a bad headache and neck pain.  She also was feeling very anxious. But reports when attempts were made to walk her, she was very wobbly and ataxic. Sometime around 830 or 9 PM a CT of the head was done because she continued to be lethargic and complained of dizziness.  The noncontrast CT head showed a evolving acute ischemic right cerebellar and bilateral PCA territory infarcts with involvement of bilateral occipital lobes and thalami along with trace petechial hemorrhage at the occipital poles without frank hemorrhagic transformation. There was associated mass-effect within the right posterior fossa with partial effacement of the right basilar cisterns and fourth ventricle but no obstructive hydrocephalus at the time.  CTA head and neck showed acute right vertebral artery dissection involving the right V3 segment at the level of the right C1 transverse foramen with probable small dissection flap just distally at the right V3 V4 junction with associated moderate stenosis.  There is also probable distal bilateral P3 P4 occlusions possibly thromboembolic in nature.  No stenosis or occlusions in the anterior circulation. Neurological consultation was obtained for possible admission and further management of the strokes. The patient has not had any neck manipulation or accidents.  No prior history of strokes.  She has a history of frequent headaches and migraines which have been worse over the  past 18 months since her mother died.  She has had multiple panic attacks ever since that time but no focal neurological deficits as noted by family.  LKW: Sometime last night to 09/11/2019. tpa given?: no, established stroke on CT, outside the window Premorbid modified Rankin scale (mRS):0   HOSPITAL COURSE Ms. ANNASTON UPHAM is a 38 y.o. female with history of ADD, panic attachs presenting with anxiety and ataxia. Found to have R sided neck pain, HA. CT showed acute R PCA infarct d/t R VA dissection  Stroke:   R VA dissection leading to R PCA infarct (R cerebellar, B PCA, B  occipital and B thalamic infarcts) etiology of dissection unclear  NS consult ([facility]) not a surgical candidate at this time  CT head evolving R cerebellar and B PCA infarcts/ trace petechial hmg. Mass effect R post fossa w/ partial effacement R basilar cisterns and 4th ventricle  CTA head & neck R VA dissction R V3 w/ flap distal R V3/4. probabl distal B P3/4 occlusions  F/u CT unchanged post circ infarcts. Unchanged edema w/o hydro  MRI   Multifocal R cerebellar infarction reflecting R VA (B thalami, LEFT temporal lobe, and RIGHT occipital lobe reflecting thalamoperforate and B PCA) Significant mass effect on fourth ventricle without hydrocephalus  2D Echo  EF 60-65%. No source of embolus   LDL 88  HgbA1c 5.7  SCDs for VTE prophylaxis  No antithrombotic prior to admission, now on No antithrombotic. Given dissection, added aspirin 325 mg daily  Therapy recommendations:  CIR  Disposition:  pending   Continue ICU level care  Cerebral Edema Induced Hypernatremia  Na remains 140  D/c 3% protocol  Blood Pressure  Stable  No hx HTN  Permissive hypertension (OK if < 220/120) but gradually normalize in 5-7 days  Long-term BP goal normotensive  Other Stroke Risk Factors  Obesity, Body mass index is 32.61 kg/m., recommend weight loss, diet and exercise as appropriate   Other Active  Problems  ADD, on adderall PTA, on hold  Panic attacks, resume zoloft     DISCHARGE EXAM Blood pressure (!) 132/94, pulse 79, temperature 97.7 F (36.5 C), temperature source Oral, resp. rate 12, height 5\' 4"  (1.626 m), weight 86.2 kg, last menstrual period 12/17/2018, SpO2 96 %. General: pleasant young Caucasian ladyPatient is awake and interactive HEENT: Normocephalic atraumatic dry mucous membranes Lungs: Clear to auscultation CVS: S1-2 heard regular rhythm Abdomen: Soft nondistended nontender Extremities: Warm well perfused intact pulses Neurological Exam She is awake and alert. She follows commands well She is able to name objects reliably. Her speech is non-dysarthric. Comprehension is intact. Repetition is intact. Follows all commands appropriately, able to maintain attention when stimualted Cranial nerves: Pupils are equal round reactive to light, extraocular movements are intact, visual fields are full to threat, face is symmetric, auditory acuity intact, palate midline, tongue midline. Motor exam: 5/5 in all extremities  Sensory exam: Intact to light touch all over Coordination: mild on ataxia in RUE Gait testing was deferred at this time    Discharge Diet   Diet Order            Diet Heart Room service appropriate? Yes; Fluid consistency: Thin  Diet effective now             liquids  DISCHARGE PLAN  Disposition:  Transfer to [facility] for ongoing PT, OT and ST  aspirin 325 mg daily for secondary stroke prevention.  Recommend ongoing risk factor control by Primary Care Physician at time of discharge from inpatient rehabilitation.  Follow-up Denita Lung, MD in 2 weeks following discharge from rehab.  Follow-up in [facility] in 4 weeks following discharge from rehab, office to schedule an appointment.   35 minutes were spent preparing discharge.  Mikey Bussing PA-C [facility] Pager  [phone] 01/06/2019, 9:59 AM  I have personally obtained history,examined this patient, reviewed notes, independently viewed imaging studies, participated in medical decision making and plan of care.ROS completed by me personally and pertinent positives fully documented  I have made any additions or clarifications directly to the above  note. Agree with note above.   Antony Contras, MD Medical Director [facility] Pager: [phone] 01/06/2019 1:21 PM

## 2019-01-07 ENCOUNTER — Inpatient Hospital Stay (HOSPITAL_COMMUNITY): Payer: 59 | Admitting: Occupational Therapy

## 2019-01-07 ENCOUNTER — Inpatient Hospital Stay (HOSPITAL_COMMUNITY): Payer: 59

## 2019-01-07 DIAGNOSIS — F411 Generalized anxiety disorder: Secondary | ICD-10-CM

## 2019-01-07 DIAGNOSIS — K5901 Slow transit constipation: Secondary | ICD-10-CM

## 2019-01-07 DIAGNOSIS — I10 Essential (primary) hypertension: Secondary | ICD-10-CM

## 2019-01-07 MED ORDER — PROCHLORPERAZINE MALEATE 5 MG PO TABS
5.0000 mg | ORAL_TABLET | Freq: Four times a day (QID) | ORAL | Status: DC | PRN
  Administered 2019-01-07: 5 mg via ORAL
  Filled 2019-01-07: qty 1

## 2019-01-07 MED ORDER — TRAZODONE HCL 50 MG PO TABS
50.0000 mg | ORAL_TABLET | Freq: Every evening | ORAL | Status: DC | PRN
  Administered 2019-01-08 – 2019-01-15 (×8): 50 mg via ORAL
  Filled 2019-01-07 (×8): qty 1

## 2019-01-07 NOTE — Progress Notes (Signed)
PHYSICAL MEDICINE & REHABILITATION PROGRESS NOTE   Subjective/Complaints: Had a reasonable night other than being a bit restless.  Has had intermittent headaches but these are usually relieved with Tylenol.  Still constipated.   ROS: Patient denies fever, rash, sore throat, blurred vision, nausea, vomiting, diarrhea, cough, shortness of breath or chest pain, joint or back pain,   or mood change.    Objective:   Ct Head Wo Contrast  Result Date: 01/05/2019 CLINICAL DATA:  Stroke follow-up EXAM: CT HEAD WITHOUT CONTRAST TECHNIQUE: Contiguous axial images were obtained from the base of the skull through the vertex without intravenous contrast. COMPARISON:  01/03/2019 FINDINGS: Brain: Cytotoxic edema within the right cerebellum has worsened with greater leftward shift of the midline posterior fossa structures and worsened mass effect on the cerebral aqueduct. There is early obstructive hydrocephalus with increased size of the lateral and third ventricles compared to the prior study. There is no acute hemorrhage. Slight worsening of edema within the right thalamus. Unchanged appearance of occipital infarcts. Unchanged appearance of left thalamus. Vascular: No abnormal hyperdensity of the major intracranial arteries or dural venous sinuses. No intracranial atherosclerosis. Skull: The visualized skull base, calvarium and extracranial soft tissues are normal. Sinuses/Orbits: No fluid levels or advanced mucosal thickening of the visualized paranasal sinuses. No mastoid or middle ear effusion. The orbits are normal. IMPRESSION: 1. 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 2. Otherwise expected evolution of thalamic and bilateral occipital lobe infarcts without acute hemorrhage. Electronically Signed   By: Ulyses Jarred M.D.   On: 01/05/2019 14:16    No results for input(s): WBC, HGB, HCT, PLT in the last 72 hours. Recent Labs    01/04/19 1159  NA 140    Intake/Output Summary (Last 24 hours) at 01/07/2019 0904 Last data filed at 01/06/2019 2300 Gross per 24 hour  Intake 480 ml  Output -  Net 480 ml     Physical Exam: Vital Signs Blood pressure (!) 142/95, pulse 86, temperature 98.3 F (36.8 C), resp. rate 16, height 5\' 4"  (1.626 m), weight 73.3 kg, last menstrual period 12/17/2018, SpO2 96 %. Constitutional: No distress . Vital signs reviewed. HEENT: EOMI, oral membranes moist Neck: supple Cardiovascular: RRR without murmur. No JVD    Respiratory: CTA Bilaterally without wheezes or rales. Normal effort    GI: BS +, non-tender, non-distended  Musculoskeletal:Normal range of motion.  General: No tenderness,deformityor edema.  Comments:  Neurological: She isalertand oriented to person, place, and time.   Good insight and awareness.  Normal language and speech.  Tracks to all visual fields with some delay in the left eye more than right.  2-3 beats of nystagmus with lateral gaze to either side.  Complains of more diplopia with gaze to right. Strength 4/5 RUE 5/5 LUE , RLE 4 to 4+/5--which is unchanged.  LLE 5/5. Decreased fmc RUE more than left with dysmetria and mild ataxia. Senses pain in all 4's. DTR's are brisk and 2-3+ in all 4   Skin: Skin iswarmand dry.  Psychiatric: Pleasant and cooperative..     Assessment/Plan: 1. 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  Physiatrist is providing close team supervision and 24 hour management of active medical problems listed below.  Physiatrist  and rehab team continue to assess barriers to discharge/monitor patient progress toward functional and medical goals  Care  Tool:  Bathing    Body parts bathed by patient: Right arm, Left arm, Chest, Abdomen, Front perineal area, Buttocks, Right upper leg, Left upper leg, Right lower leg, Left lower leg, Face         Bathing assist Assist Level: Supervision/Verbal cueing     Upper Body Dressing/Undressing Upper body dressing   What is the patient wearing?: Bra, Pull over shirt    Upper body assist Assist Level: Supervision/Verbal cueing    Lower Body Dressing/Undressing Lower body dressing      What is the patient wearing?: Underwear/pull up, Pants     Lower body assist Assist for lower body dressing: Contact Guard/Touching assist     Toileting Toileting Toileting Activity did not occur (Clothing management and hygiene only): Refused  Toileting assist Assist for toileting: Minimal Assistance - Patient > 75%     Transfers Chair/bed transfer  Transfers assist     Chair/bed transfer assist level: Minimal Assistance - Patient > 75%     Locomotion Ambulation   Ambulation assist              Walk 10 feet activity   Assist           Walk 50 feet activity   Assist           Walk 150 feet activity   Assist           Walk 10 feet on uneven surface  activity   Assist           Wheelchair     Assist               Wheelchair 50 feet with 2 turns activity    Assist            Wheelchair 150 feet activity     Assist          Medical Problem List and Plan: 1.Decreased functional mobility with ataxic gait and associated dizziness, nausea, headachesecondary to right cerebellar and bilateral PCA infarctions fromrightvertebral artery dissection Getting therapies today ECASA 325mg  daily 2. DVT Prophylaxis/Anticoagulation: SCDs. Monitor for any signs of DVT 3. Pain Management:Tylenol as needed Consider adjustments to medications as necessary.  Patient is satisfied with control at  present and does not want to add further medication at this time. 4. Mood/ADD/panic attacks:Zoloft 100 mg daily at bedtime -team and family to provide ego support  -trazodone prn for sleep 5. Neuropsych: This patientiscapable of making decisions on herown behalf. 6. Skin/Wound Care:Routine skin checks 7. Fluids/Electrolytes/Nutrition:Routine in and out's with follow-up chemistries -encourage PO intake 8. Elevated blood pressure: Monitor with increased mobility 9. Constipation: Continue to augment bowel regimen -Had only small hard bowel movement with sorbitol and Dulcolax suppository this morning.  -We will try soapsuds enema later today. 10.  Persistent HTN: "permissive HTN (ok if <220/120) but gradually normalize over next  4-6 days    LOS: 1 days A FACE TO FACE EVALUATION WAS PERFORMED  Meredith Staggers 01/07/2019, 9:04 AM

## 2019-01-07 NOTE — Progress Notes (Signed)
Patient c/o nausea. Nurse called on call provider and received order for compazine 5mg . Patient denies any other symtpoms Nicholes Rough, LPN

## 2019-01-07 NOTE — Evaluation (Signed)
Physical Therapy Assessment and Plan  Patient Details  Name: [name] MRN: [id number] Date of Birth: October 22, 1981  PT Diagnosis: Abnormal posture, Ataxia and Difficulty walking Rehab Potential: Good ELOS: 7-9 days   Today's Date: 01/07/2019 PT Individual Time: 1100-1200 PT Individual Time Calculation (min): 60 min    Problem List:  Patient Active Problem List   Diagnosis Date Noted  . Vertebral artery dissection ([place])   . Elevated blood pressure reading   . Major depressive disorder   . Panic disorder   . CVA (cerebral vascular accident) ([place]) 01/02/2019  . Acute ischemic stroke ([place]) 01/02/2019  . S/P cesarean section 05/05/2016  . S/P tubal ligation 05/05/2016  . Term pregnancy, repeat 05/04/2016  . Unspecified vitamin D deficiency 12/18/2012  . ADHD (attention deficit hyperactivity disorder) 11/26/2011  . Dysthymia 11/26/2011    Past Medical History:  Past Medical History:  Diagnosis Date  . ADD (attention deficit disorder)   . Exercise-induced asthma   . Hx of varicella   . Kidney stones   . S/P cesarean section 05/05/2016  . S/P tubal ligation 05/05/2016  . Term pregnancy, repeat 05/04/2016   Past Surgical History:  Past Surgical History:  Procedure Laterality Date  . BACK SURGERY     scoliosis age 78  . BILATERAL SALPINGECTOMY Bilateral 05/05/2016   Procedure: BILATERAL SALPINGECTOMY;  Surgeon: Janyth Contes, MD;  Location: [facility];  Service: Obstetrics;  Laterality: Bilateral;  . CESAREAN SECTION    . CESAREAN SECTION N/A 05/05/2016   Procedure: CESAREAN SECTION;  Surgeon: Janyth Contes, MD;  Location: [facility];  Service: Obstetrics;  Laterality: N/A;  . CHOLECYSTECTOMY    . HAND SURGERY    . HERNIA REPAIR    . KIDNEY STONE SURGERY     R--surgery; L lithotripsy.  Dr. Gaynelle Arabian    Assessment & Plan Clinical Impression: Patient is a 38 y.o. year old female with history of ADHD maintained on Adderall and Xanax, panic  attacks, exercise-induced asthma. Per chart reviewand patient,patient lives with spouse. Independent prior to admission. Two-level home 5 steps to entry and bedroom on main level. She has 4 children ages 49 through 4. Family planning assistance as needed. Presented to2/11/2020with lethargy, dizziness and anxiety with bouts of nausea as well as headache and neck pain. Patient could not recall any type of fall or neck hyperextension injury prior to admission. 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 Associated trace petechial hemorrhage in the occipital poles without frank hemorrhagic conversion. 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 Probable distal bilateral P3-P4 occlusion. Urine drug screen positive foramphetamines. MRI the brain showed multifocal areas of acute infarction, predominantly affecting the right cerebellum reflecting right vertebral dissection but also areas of acute infarction elsewhere in the posterior circulation notably both thalami, left temporal lobe. Noted significant mass effect on the outlet foramina the fourth ventricle without current features of hydrocephalus. [facility] CT reviewed, scan with slightly worsening compression of fourth ventricle, read pending. Patient completed a course of 3% normalsaline. Maintain on aspirin for CVA prophylaxis. Tolerating a regular diet. Therapy evaluations completed with recommendations of physical medicine rehabilitation consult. Patient was admitted for a comprehensive rehabilitation program.Patient was planned to be admitted today, however, due to increase in fluid on CT,  will hold per Neurology and monitor overnight.Patient transferred to CIR on  01/06/2019 .   Patient currently requires min with mobility secondary to muscle weakness and decreased standing balance, decreased postural control and decreased balance strategies.  Prior to hospitalization, patient was independent  with mobility and lived with Spouse(4 children) in a House home.  Home access is 5Stairs to enter.  Patient will benefit from skilled PT intervention to maximize safe functional mobility, minimize fall risk and decrease caregiver burden for planned discharge home alone.  Anticipate patient will benefit from follow up OP at discharge.  PT - End of Session Activity Tolerance: Tolerates 30+ min activity with multiple rests Endurance Deficit: Yes PT Assessment Rehab Potential (ACUTE/IP ONLY): Good PT Patient demonstrates impairments in the following area(s): Behavior;Balance;Endurance;Motor;Nutrition;Pain;Safety;Sensory PT Transfers Functional Problem(s): Bed Mobility;Bed to Chair;Car;Furniture;Floor PT Locomotion Functional Problem(s): Ambulation;Wheelchair Mobility;Stairs PT Plan PT Intensity: Minimum of 1-2 x/day ,45 to 90 minutes PT Frequency: 5 out of 7 days PT Duration Estimated Length of Stay: 7-9 days PT Treatment/Interventions: Ambulation/gait training;Disease management/prevention;Pain management;Stair training;Visual/perceptual remediation/compensation;Balance/vestibular training;DME/adaptive equipment instruction;Patient/family education;Therapeutic Activities;Wheelchair propulsion/positioning;Cognitive remediation/compensation;Functional electrical stimulation;Psychosocial support;Therapeutic Exercise;Community reintegration;Functional mobility training;Skin care/wound management;UE/LE Strength taining/ROM;Discharge planning;Neuromuscular re-education;Splinting/orthotics;UE/LE Coordination activities PT Transfers Anticipated Outcome(s): mod I PT Locomotion Anticipated Outcome(s): Mod I PT Recommendation Recommendations for Other Services: Therapeutic  Recreation consult Therapeutic Recreation Interventions: Outing/community reintergration Follow Up Recommendations: Outpatient PT Patient destination: Home Equipment Recommended: To be determined  Skilled Therapeutic Intervention Evaluation completed (see details above and below) with education on PT POC and goals and individual treatment initiated with focus on functional mobility, ambulation, dynamic balance and safety. Pt supine in bed upon PT arrival, agreeable to therapy tx and denies pain. Pt transferred to sitting with supervision.  Pt ambulated into bathroom with RW and min assist, verbal cues for R foot placement and techniques. Pt continent of bowel and bladder. Pt ambulated to sink to wash hands. Pt ambulated to the gym with RW and min assist x 150 ft. Pt ascended/descended 12 steps with rails and min assist, verbal cues for techniques. Pt ambulated x 150 ft without AD and min-mod assist to correct R lateral LOB with occasional R LE scissoring, verbal cues to correct scissoring and for wider BOS. Pt ambulated to ortho gym with RW and min assist, performed car transfer with min assist. Pt ambulated back to room x 300 ft with min assist and RW, left seated in bed with needs in reach and alarm set.    PT Evaluation Precautions/Restrictions Precautions Precautions: Fall Restrictions Weight Bearing Restrictions: No General   Vital Signs Pain Pain Assessment Pain Scale: 0-10 Pain Score: 0-No pain Home Living/Prior Functioning Home Living Available Help at Discharge: Family;Available 24 hours/day Type of Home: House Home Access: Stairs to enter [employer] of Steps: 5 Home Layout: Two level Alternate Level Stairs-Number of Steps: flight  Lives With: Spouse(4 children) Prior Function Level of Independence: Independent with basic ADLs;Independent with gait  Able to Take Stairs?: Yes Driving: Yes Vocation: Works at home Comments: 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  Coordination Gross Motor Movements are Fluid and Coordinated: No Fine Motor Movements are Fluid and Coordinated: No Coordination and Movement Description: R LE ataxia Motor  Motor Motor: Ataxia  Motor - Skilled Clinical Observations: R sided ataxia  Mobility Bed Mobility Bed Mobility: Rolling Right;Rolling Left;Supine to Sit;Sit to Supine Rolling Right: Supervision/verbal cueing Rolling Left: Supervision/Verbal cueing Supine to Sit: Supervision/Verbal cueing Sit to Supine: Supervision/Verbal cueing Transfers Transfers: Sit to Stand;Stand to Sit;Stand Pivot Transfers Sit to Stand: Minimal Assistance - Patient > 75% Stand to Sit: Minimal Assistance - Patient > 75% Stand Pivot Transfers: Minimal Assistance - Patient > 75% Transfer (Assistive device): Rolling walker Locomotion  Gait Ambulation: Yes Gait Assistance: Minimal Assistance - Patient > 75%;Moderate Assistance - Patient 50-74% Gait Distance (Feet): 150 Feet Assistive device: None Gait Assistance Details: Verbal cues for sequencing;Verbal cues for technique;Verbal cues for precautions/safety Gait Gait: Yes Gait Pattern: Scissoring;Ataxic Gait velocity: decreased Stairs / Additional Locomotion Stairs: Yes Stairs Assistance: Minimal Assistance - Patient > 75% Stair Management Technique: Two rails Number of Stairs: 12 Height of Stairs: 6 Wheelchair Mobility Wheelchair Mobility: No  Trunk/Postural Assessment  Cervical Assessment Cervical Assessment: Within Functional Limits Thoracic Assessment Thoracic  Assessment: Exceptions to WFL(scoliosis) Lumbar Assessment Lumbar Assessment: Exceptions to WFL(scoliosis) Postural Control Postural Control: Deficits on evaluation  Balance Balance Balance Assessed: Yes Static Sitting Balance Static Sitting - Level of Assistance: 5: Stand by assistance Dynamic Sitting Balance Dynamic Sitting - Level of Assistance: 5: Stand by assistance Static Standing Balance Static Standing - Level of Assistance: 4: Min assist Dynamic Standing Balance Dynamic Standing - Level of Assistance: 4: Min assist;3: Mod assist Extremity Assessment   RLE Assessment RLE Assessment: Within Functional Limits LLE Assessment LLE Assessment: Within Functional Limits    Refer to Care Plan for Long Term Goals  Recommendations for other services: Therapeutic Recreation  Outing/community reintegration  Discharge Criteria: Patient will be discharged from PT if patient refuses treatment 3 consecutive times without medical reason, if treatment goals not met, if there is a change in medical status, if patient makes no progress towards goals or if patient is discharged from hospital.  The above assessment, treatment plan, treatment alternatives and goals were discussed and mutually agreed upon: by patient  Netta Corrigan, PT, DPT 01/07/2019, 12:09 PM

## 2019-01-07 NOTE — Progress Notes (Signed)
At 2115, restless C/O HA. PRN tylenol given at 2129. BP=160/109 at 2150. Rechecked BP at 0215, after patient more relaxed=135/94. Abd. Slightly distended with hypoactive bowel sounds. Patient agreed to take a supp. In am at 0630. Reports glasses with tape, help with double vision. Aunt at bedside. [name] A

## 2019-01-07 NOTE — Evaluation (Signed)
Occupational Therapy Assessment and Plan  Patient Details  Name: [name] MRN: [id number] Date of Birth: 1981-05-23  OT Diagnosis: disturbance of vision, hemiplegia affecting non-dominant side and muscle weakness (generalized), coordination disorder Rehab Potential: Rehab Potential (ACUTE ONLY): Excellent ELOS: 7-10 days   Today's Date: 01/07/2019 OT Individual Time: 8916-9450 and 3888-2800 OT Individual Time Calculation (min): 56 min and 76 min  Problem List:  Patient Active Problem List   Diagnosis Date Noted  . Vertebral artery dissection ([place])   . Elevated blood pressure reading   . Major depressive disorder   . Panic disorder   . CVA (cerebral vascular accident) ([place]) 01/02/2019  . Acute ischemic stroke ([place]) 01/02/2019  . S/P cesarean section 05/05/2016  . S/P tubal ligation 05/05/2016  . Term pregnancy, repeat 05/04/2016  . Unspecified vitamin D deficiency 12/18/2012  . ADHD (attention deficit hyperactivity disorder) 11/26/2011  . Dysthymia 11/26/2011    Past Medical History:  Past Medical History:  Diagnosis Date  . ADD (attention deficit disorder)   . Exercise-induced asthma   . Hx of varicella   . Kidney stones   . S/P cesarean section 05/05/2016  . S/P tubal ligation 05/05/2016  . Term pregnancy, repeat 05/04/2016   Past Surgical History:  Past Surgical History:  Procedure Laterality Date  . BACK SURGERY     scoliosis age 39  . BILATERAL SALPINGECTOMY Bilateral 05/05/2016   Procedure: BILATERAL SALPINGECTOMY;  Surgeon: Janyth Contes, MD;  Location: [place];  Service: Obstetrics;  Laterality: Bilateral;  . CESAREAN SECTION    . CESAREAN SECTION N/A 05/05/2016   Procedure: CESAREAN SECTION;  Surgeon: Janyth Contes, MD;  Location: [place];  Service: Obstetrics;  Laterality: N/A;  . CHOLECYSTECTOMY    . HAND SURGERY    . HERNIA REPAIR    . KIDNEY STONE SURGERY     R--surgery; L lithotripsy.  Dr. Gaynelle Arabian     Assessment & Plan Clinical Impression:  [name] is a 38 year old right-handed female with history of ADHD maintained on Adderall and Xanax, panic attacks, exercise-induced asthma. Per chart reviewand patient,patient lives with spouse. Independent prior to admission. Two-level home 5 steps to entry and bedroom on main level. She has 4 children ages 66 through 64. Family planning assistance as needed. Presented to2/11/2020with lethargy, dizziness and anxiety with bouts of nausea as well as headache and neck pain. Patient could not recall any type of fall or neck hyperextension injury prior to admission. 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 Associated trace petechial hemorrhage in the occipital poles without frank hemorrhagic conversion. 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 Probable distal bilateral P3-P4 occlusion. Urine drug screen positive foramphetamines. MRI the brain showed multifocal areas of acute infarction, predominantly affecting the right cerebellum reflecting right vertebral dissection but also areas of acute infarction elsewhere in the posterior circulation notably both thalami, left temporal lobe. Noted significant mass effect on the outlet foramina the fourth ventricle without current features of hydrocephalus. [facility] Repeat CT reviewed, scan with slightly worsening compression of fourth ventricle, read pending. Patient completed a course of 3% normalsaline. Maintain on aspirin for CVA prophylaxis. Tolerating a regular diet. Therapy evaluations completed with recommendations of physical medicine rehabilitation consult. Patient was admitted for a comprehensive rehabilitation  program.Patient was planned to be admitted today, however, due to increase in fluid on CT, will hold per Neurology and monitor overnight.  Patient currently requires min with basic self-care skills secondary to muscle weakness, decreased cardiorespiratoy endurance, unbalanced muscle activation, ataxia and decreased coordination, diplopia and decreased standing balance, hemiplegia and decreased balance strategies.  Prior to hospitalization, patient could complete BADLs with independent .  Patient will benefit from skilled intervention to increase independence with basic self-care skills prior to discharge home with family.  Anticipate patient will require 24 hour supervision and follow up home health.  OT - End of Session Endurance Deficit: Yes Endurance Deficit Description: Needed a seated rest break before finishing combing hair at sink (in standing) OT Assessment Rehab Potential (ACUTE ONLY): Excellent OT Barriers to Discharge: Medical stability OT Patient demonstrates impairments in the following area(s): Balance;Vision;Safety;Endurance;Motor OT Basic ADL's Functional Problem(s): Grooming;Bathing;Dressing;Toileting;Eating OT Advanced ADL's Functional Problem(s): Simple Meal Preparation;Light Housekeeping OT Transfers Functional Problem(s): Toilet;Tub/Shower OT Additional Impairment(s): Fuctional Use of Upper Extremity OT Plan OT Intensity: Minimum of 1-2 x/day, 45 to 90 minutes OT Frequency: 5 out of 7 days OT Duration/Estimated Length of Stay: 7-10 days OT Treatment/Interventions: Balance/vestibular training;Community reintegration;Disease mangement/prevention;Neuromuscular re-education;Patient/family education;Self Care/advanced ADL retraining;Therapeutic Exercise;UE/LE Coordination activities;Wheelchair propulsion/positioning;Visual/perceptual remediation/compensation;UE/LE Strength taining/ROM;Therapeutic Activities;Psychosocial support;Pain management;Functional mobility  training;DME/adaptive equipment instruction;Discharge planning OT Self Feeding Anticipated Outcome(s): Mod I  OT Basic Self-Care Anticipated Outcome(s): Mod I  OT Toileting Anticipated Outcome(s): Mod I  OT Bathroom Transfers Anticipated Outcome(s): Mod I  OT Recommendation Recommendations for Other Services: Therapeutic Recreation consult;Neuropsych consult Therapeutic Recreation Interventions: Pet therapy Patient destination: Home Follow Up Recommendations: Home health OT;Outpatient OT(either) Equipment Recommended: To be determined   Skilled Therapeutic Intervention Skilled OT session completed with focus on initial evaluation, education on OT role/POC, and establishment of patient-centered goals. Pt greeted in bed with no c/o pain and motivated for tx. She completed bathing (at shower level, sitting per choice), dressing (sit<stand from low toilet), and grooming tasks (standing at sink) during session. She completed first transfer to shower using RW with Min A at ambulatory level. Pt with mild dysmetria and decreased R UE coordination during shower, dropped her wash cloth and 1 soap container. She opted to complete perihygiene while seated. She ambulated short distance to toilet without device and Mod A due to R LE ataxia. During dressing, noted the same R UE coordination deficits, however pt able to meet FM demands of applying deoderant and fastening shoelaces. OT donned Teds. Mod A for ambulation to w/c placed at sink without use of device. She stood for 5 minutes to brush her hair, using R UE at dominant level. She brushed the curtain a bit due to dysmetria. After five minutes she reported fatigue and needed to sit down for a break. OT finished combing hair to remove tangles. She stood again to complete oral care, able to remove toothbrush from plastic wrapping with increased time. At end of session she returned to bed and was left with all needs within reach and bed alarm set.   Pt with  diplopia during tx. She opted to just close one eye vs use her partial occlusion glasses.   2nd Session 1:1 tx (76 min) Pt greeted in bed, reported fatigue from earlier therapies, but amenable to tx. Motivated to shave her underarms. She completed short distance ambulation without device to w/c placed at sink with Mod A. While seated, pt shaved underarms with supervision and setup of supplies. Then worked on Dietitian and  R UE coordination via bedmaking tasks. Pt ambulated without device and Mod A to place soiled linen in laundry bag, and also to retrieve necessary items placed beside dresser. She was able to strip bed of linen and reapply fitted sheet, sheets, and blankets with balance assist and vcs for R LE placement/increased weightbearing. Pt with 1 forward LOB when reaching for comforter. Mod A to recover. She donned pillowcases while sitting due to standing fatigue. She also required 2 seated rest breaks before that when making up bed. Afterwards she completed ambulatory transfer to bathroom using RW with Min A. Pt completed toileting tasks with steady assist overall. She doffed clothing while sitting, including Ted stockings, with supervision-steady assist for balance. Pt then donned pajama shorts, tank top, and gripper socks with steady assist for standing balance. She ambulated back to bed and was left with all needs, bed alarm set, and spouse Wes present.   She used her partially occluded glasses during tx   OT Evaluation Precautions/Restrictions  Precautions Precautions: Fall General Chart Reviewed: Yes Family/Caregiver Present: Yes(father) Pain: No c/o pain during tx  Pain Assessment Pain Scale: 0-10 Pain Score: 1  Pain Type: Acute pain Pain Location: Head Pain Descriptors / Indicators: Aching Pain Frequency: Intermittent Pain Onset: Gradual Patients Stated Pain Goal: 0 Pain Intervention(s): Medication (See eMAR) Home Living/Prior Functioning Home Living Family/patient  expects to be discharged to:: Private residence Living Arrangements: Spouse/significant other, Children Available Help at Discharge: Family, Available 24 hours/day Type of Home: House Home Access: Stairs to enter Technical brewer of Steps: 5 Home Layout: Two level, Able to live on main level with bedroom/bathroom Alternate Level Stairs-Number of Steps: flight Bathroom Shower/Tub: Multimedia programmer: Associate Professor Accessibility: Yes  Lives With: Spouse(and 4 children, ages 2.5-15 y/o) IADL History Homemaking Responsibilities: Yes Meal Prep Responsibility: Primary Laundry Responsibility: Primary Cleaning Responsibility: Primary Shopping Responsibility: Primary Child Care Responsibility: Primary Homemaking Comments: per pt, she "did it all" Occupation: Part time employment Type of Occupation: 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) Leisure and Hobbies: She used to enjoy cheerleading, and now she spends a great deal of time taking care of her kids Prior Function Level of Independence: Independent with basic ADLs, Independent with homemaking with ambulation, Independent with transfers  Able to Take Stairs?: Yes Driving: Yes ADL ADL Eating: Not assessed Grooming: Contact guard Where Assessed-Grooming: Standing at sink Upper Body Bathing: Supervision/safety Where Assessed-Upper Body Bathing: Shower Lower Body Bathing: Supervision/safety Where Assessed-Lower Body Bathing: Shower Upper Body Dressing: Supervision/safety Where Assessed-Upper Body Dressing: Other (Comment)(sitting on toilet) Lower Body Dressing: Contact guard Where Assessed-Lower Body Dressing: Other (Comment)(sit<stand from standard toilet) Toileting: Not assessed Toilet Transfer: Moderate assistance Toilet Transfer Method: Ambulating(without device) Science writer: Energy manager: Environmental education officer  Method: Ambulating(RW) Youth worker: Radio broadcast assistant, Grab bars Vision Patient Visual Report: Diplopia Vision Assessment?: Vision impaired- to be further tested in functional context Diplopia Assessment: Disappears with one eye closed(able to read labels on ADL items with one eye closed; double vision whenever both eyes are open, per pt report).  Perception  Perception: Within Functional Limits Praxis Praxis: Intact Cognition Overall Cognitive Status: Impaired/different from baseline Arousal/Alertness: Awake/alert Year: 2020 Month: February Day of Week: Correct Memory: Appears intact Immediate Memory Recall: Sock;Blue;Bed Memory Recall: Sock;Blue;Bed Memory Recall Sock: Without Cue Memory Recall Blue: Without Cue Memory Recall Bed: With Cue Attention: Alternating;Sustained Sustained Attention: Appears intact Awareness: Appears intact Problem Solving:  Appears intact Safety/Judgment: Appears intact Sensation Sensation Light Touch: Appears Intact(B UEs) Proprioception: Appears Intact Gross Motor Movements are Fluid and Coordinated: No Fine Motor Movements are Fluid and Coordinated: No Coordination and Movement Description: R UE/LE ataxia  Finger Nose Finger Test: Dysmetria R UE. WFL L UE Motor  Motor Motor: Ataxia Motor - Skilled Clinical Observations: R sided ataxia Mobility  Transfers Sit to Stand: Minimal Assistance - Patient > 75%(during self care tasks) Stand to Sit: Minimal Assistance - Patient > 75%  Min A walk in shower transfer at ambulatory level using device Mod A toilet transfer at ambulatory level without device  Trunk/Postural Assessment  Cervical Assessment Cervical Assessment: Within Functional Limits Thoracic Assessment Thoracic Assessment: (hx scoliosis, rounded shoulders) Lumbar Assessment Lumbar Assessment: Exceptions to WFL(hx scoliosis) Postural Control Postural Control: Within Functional Limits  Balance Balance Balance  Assessed: Yes Dynamic Sitting - Level of Assistance: 5: Stand by assistance Sitting balance - Comments: Washing feet in shower utilizing figure 4 position  Dynamic Standing Balance Dynamic Standing - Level of Assistance: 4: Min assist;3: Mod assist(Min A to elevate pants over hips in standing, Mod A for toilet transfer without device) Extremity/Trunk Assessment RUE Assessment RUE Assessment: Exceptions to [facility] Active Range of Motion (AROM) Comments: WNL General Strength Comments: (Mild ataxia and dysmetria. 4+/5 proximal to distal) RUE Body System: Neuro LUE Assessment LUE Assessment: Within Functional Limits Active Range of Motion (AROM) Comments: 4+/5 proximal to distal   Refer to Care Plan for Long Term Goals  Recommendations for other services: Therapeutic Recreation  Pet therapy   Discharge Criteria: Patient will be discharged from OT if patient refuses treatment 3 consecutive times without medical reason, if treatment goals not met, if there is a change in medical status, if patient makes no progress towards goals or if patient is discharged from hospital.  The above assessment, treatment plan, treatment alternatives and goals were discussed and mutually agreed upon: by patient and by family  Skeet Simmer 01/07/2019, 3:55 PM

## 2019-01-08 ENCOUNTER — Inpatient Hospital Stay (HOSPITAL_COMMUNITY): Payer: 59

## 2019-01-08 ENCOUNTER — Inpatient Hospital Stay (HOSPITAL_COMMUNITY): Payer: 59 | Admitting: Occupational Therapy

## 2019-01-08 ENCOUNTER — Inpatient Hospital Stay (HOSPITAL_COMMUNITY): Payer: 59 | Admitting: Speech Pathology

## 2019-01-08 DIAGNOSIS — I69398 Other sequelae of cerebral infarction: Secondary | ICD-10-CM

## 2019-01-08 DIAGNOSIS — R269 Unspecified abnormalities of gait and mobility: Secondary | ICD-10-CM

## 2019-01-08 DIAGNOSIS — R270 Ataxia, unspecified: Secondary | ICD-10-CM

## 2019-01-08 LAB — CBC WITH DIFFERENTIAL/PLATELET
Abs Immature Granulocytes: 0.03 10*3/uL (ref 0.00–0.07)
Basophils Absolute: 0.1 10*3/uL (ref 0.0–0.1)
Basophils Relative: 1 %
Eosinophils Absolute: 0.3 10*3/uL (ref 0.0–0.5)
Eosinophils Relative: 3 %
HCT: 42.9 % (ref 36.0–46.0)
Hemoglobin: 14.3 g/dL (ref 12.0–15.0)
Immature Granulocytes: 0 %
Lymphocytes Relative: 33 %
Lymphs Abs: 3.5 10*3/uL (ref 0.7–4.0)
MCH: 28.9 pg (ref 26.0–34.0)
MCHC: 33.3 g/dL (ref 30.0–36.0)
MCV: 86.7 fL (ref 80.0–100.0)
Monocytes Absolute: 0.8 10*3/uL (ref 0.1–1.0)
Monocytes Relative: 7 %
Neutro Abs: 6 10*3/uL (ref 1.7–7.7)
Neutrophils Relative %: 56 %
Platelets: 349 10*3/uL (ref 150–400)
RBC: 4.95 MIL/uL (ref 3.87–5.11)
RDW: 13.5 % (ref 11.5–15.5)
WBC: 10.6 10*3/uL — ABNORMAL HIGH (ref 4.0–10.5)
nRBC: 0 % (ref 0.0–0.2)

## 2019-01-08 LAB — COMPREHENSIVE METABOLIC PANEL
ALT: 23 U/L (ref 0–44)
AST: 20 U/L (ref 15–41)
Albumin: 3.6 g/dL (ref 3.5–5.0)
Alkaline Phosphatase: 73 U/L (ref 38–126)
Anion gap: 8 (ref 5–15)
BUN: 7 mg/dL (ref 6–20)
CO2: 28 mmol/L (ref 22–32)
Calcium: 8.9 mg/dL (ref 8.9–10.3)
Chloride: 104 mmol/L (ref 98–111)
Creatinine, Ser: 0.8 mg/dL (ref 0.44–1.00)
GFR calc Af Amer: >60 mL/min (ref >60)
GFR calc non Af Amer: >60 mL/min (ref >60)
Glucose, Bld: 102 mg/dL — ABNORMAL HIGH (ref 70–99)
Potassium: 3.7 mmol/L (ref 3.5–5.1)
Sodium: 140 mmol/L (ref 135–145)
Total Bilirubin: 0.6 mg/dL (ref 0.3–1.2)
Total Protein: 7 g/dL (ref 6.5–8.1)

## 2019-01-08 MED ORDER — ALPRAZOLAM 0.25 MG PO TABS
0.2500 mg | ORAL_TABLET | Freq: Two times a day (BID) | ORAL | Status: DC | PRN
  Administered 2019-01-08: 0.25 mg via ORAL
  Filled 2019-01-08: qty 1

## 2019-01-08 NOTE — Progress Notes (Signed)
Physical Therapy Session Note  Patient Details  Name: [name] MRN: [id number] Date of Birth: [birth year]  Today's Date: 01/08/2019 PT Individual Time: 0800-0900 PT Individual Time Calculation (min): 60 min   Short Term Goals: Week 1:  PT Short Term Goal 1 (Week 1): STG=LTG due to ELOS  Skilled Therapeutic Interventions/Progress Updates:    Pt using bathroom upon PT arrival, agreeable to therapy tx and denies pain. Pt stood with RW and CGA, performed clothing management and ambulated to the sink to wash hands. Pt transferred to bed with CGA and donned shoes with supervision. Pt ambulated to the gym with RW and CGA x 150 ft, pt with occasional R LE scissoring and R lateral LOB. Pt participated in berg balance test this session as detailed below, pt scored 32/56 and discussed these results with the pt. Pt frequently lost balance laterally to the R during berg balance test, lacking balance reactions on R side. Pt ambulated x 100 ft this session without AD and min assist, occasional mod assist to correct R lateral LOB. Pt worked on dynamic standing balance to perform toe taps to colored dots on the floor, performed sidestepping 2 x 8 ft in each direction, mod-max assist at times to correct R lateral LOB. Pt ambulated back to room with RW and min assist. Pt's husband checked off for transfers this session within the room using RW. Pt left seated in bed with needs in reach and husband present.   Therapy Documentation Precautions:  Precautions Precautions: Fall Restrictions Weight Bearing Restrictions: No   Balance Balance Balance Assessed: Yes Standardized Balance Assessment Standardized Balance Assessment: Berg Balance Test Berg Balance Test Sit to Stand: Able to stand without using hands and stabilize independently Standing Unsupported: Able to stand safely 2 minutes Sitting with Back Unsupported but Feet Supported on Floor or Stool: Able to sit safely and securely 2 minutes Stand to  Sit: Sits safely with minimal use of hands Transfers: Able to transfer safely, definite need of hands Standing Unsupported with Eyes Closed: Able to stand 10 seconds with supervision Standing Ubsupported with Feet Together: Needs help to attain position but able to stand for 30 seconds with feet together From Standing, Reach Forward with Outstretched Arm: Reaches forward but needs supervision From Standing Position, Pick up Object from Floor: Able to pick up shoe, needs supervision From Standing Position, Turn to Look Behind Over each Shoulder: Turn sideways only but maintains balance Turn 360 Degrees: Needs close supervision or verbal cueing Standing Unsupported, Alternately Place Feet on Step/Stool: Able to complete >2 steps/needs minimal assist Standing Unsupported, One Foot in Front: Loses balance while stepping or standing Standing on One Leg: Tries to lift leg/unable to hold 3 seconds but remains standing independently Total Score: 32   Therapy/Group: Individual Therapy  Netta Corrigan, PT, DPT 01/08/2019, 7:49 AM

## 2019-01-08 NOTE — Progress Notes (Signed)
Patient information reviewed and entered into [facility] System by Becky Cande Mastropietro, PPS coordinator. Information including medical coding, function ability, and quality indicators will be reviewed and updated through discharge.   

## 2019-01-08 NOTE — Progress Notes (Signed)
Tonya Gong, RN  Rehab Admission Coordinator  Physical Medicine and Rehabilitation  PMR Pre-admission  Addendum  Date of Service:  01/05/2019 9:04 AM       Related encounter: ED to Hosp-Admission (Discharged) from 01/02/2019 in [facility] NEURO/TRAUMA/SURGICAL ICU         Show:Clear all [x] Manual[x] Template[x] Copied  Added by: [x] Tonya Gong, RN  [] Hover for details PMR Admission Coordinator Pre-Admission Assessment  Patient: [name] is an 38 y.o., female MRN: [id number] DOB: 1981/02/09 Height: 5\' 4"  (162.6 cm) Weight: 86.2 kg                                                                                                                                                  Insurance Information HMO:     PPO: yes     PCP:      IPA:      80/20:      OTHER:  PRIMARY: [place]      Policy#: [id number]      Subscriber: pt CM Name: [name]      Phone#: [phone]     Fax#: [phone] Pre-Cert#: [id number] approved for 7 days f/u [facility] phone [phone] ext [phone] fax [phone]      Employer:  Benefits:  Phone #: [phone]     Name: 01/04/2019 Eff. Date: 06/22/2018     Deduct: $2000      Out of Pocket Max: $6500/includes deductible      Life Max: none CIR: 80%      SNF: 80% 60 days Outpatient: $25 co pay per visit     Co-Pay: 23 visits each PT, OT, and SLP Home Health: 80%      Co-Pay: visits per medical neccesity DME: 80%     Co-Pay: 20% Providers: in network  SECONDARY: Medicaid of [place]      Policy#: [id number]      Subscriber: pt Benefits:  Phone #: passport one online     Name: 01/04/2019 Eff. Date: active 01/04/2019      MAFCN  Medicaid Application Date:       Case Manager:  Disability Application Date:       Case Worker:   Emergency Contact Information         Contact Information    Name Relation Home Work Mobile   [name],[name] [place] Spouse [phone]  [phone]     Current Medical History  Patient Admitting  Diagnosis: Right cerebellar and bilateral PCA infarcts  History of Present Illness: [name] is a 38 year old right-handed female with history of ADHD maintained on Adderall and Xanax, panic attacks, exercise-induced asthma.  Presented to 01/02/2019 with lethargy, dizziness and anxiety with bouts of nausea as well as headache and neck pain. Patient could not recall any type of fall or neck hyperextension injury prior to admission. CT of the head as  well as CT angiogram of head and neck showed evolving acute ischemic right cerebellar and bilateral PCA territory infarction with involvement of the bilateral occipital lobes and thalami. Associated trace petechial hemorrhage in the occipital poles without frank hemorrhagic conversion. 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 Probable distal bilateral P3-P4 occlusion. Urine drug screen positive for amphetamines. MRI the brain showed multifocal areas of acute infarction, predominantly affecting the right cerebellum reflecting right vertebral dissection but also areas of acute infarction elsewhere in the posterior circulation notably both thalami, left temporal lobe. Noted significant mass effect on the outlet foramina the fourth ventricle without current features of hydrocephalus. Neurosurgery follow-up currently with conservative care. Patient completed a course of 3% normal saline. Maintain on aspirin for CVA prophylaxis. Tolerating a regular diet.  Repeat CT scan 2/14 demonstrates evolution of a right cerebellar infarct. SOme slightly worsened compression of her fourth ventricle. Ventricles slightly larger but not significantly so per Dr. Annette Stable. Felt may be developing some mild early hydrocephalus. Plans to continue to follow her clinical exam. Patient awake and interactive. Some complaints of mild right posterior occipital headache on 2/14. Observed overnight before planned admit  01/06/2019.   Complete NIHSS TOTAL: 3  Past Medical History      Past Medical History:  Diagnosis Date  . ADD (attention deficit disorder)   . Exercise-induced asthma   . Hx of varicella   . Kidney stones   . S/P cesarean section 05/05/2016  . S/P tubal ligation 05/05/2016  . Term pregnancy, repeat 05/04/2016    Family History  family history includes Cancer in her maternal aunt, maternal grandmother, and mother.  Prior Rehab/Hospitalizations:  Has the patient had major surgery during 100 days prior to admission? No  Current Medications   Current Facility-Administered Medications:  .  0.9 %  sodium chloride infusion, , Intravenous, PRN, Amie Portland, MD, Stopped at 01/03/19 [phone] .  acetaminophen (TYLENOL) tablet 650 mg, 650 mg, Oral, Q4H PRN, 650 mg at 01/05/19 0833 **OR** acetaminophen (TYLENOL) solution 650 mg, 650 mg, Per Tube, Q4H PRN **OR** acetaminophen (TYLENOL) suppository 650 mg, 650 mg, Rectal, Q4H PRN, Amie Portland, MD .  aspirin EC tablet 325 mg, 325 mg, Oral, Daily, Biby, Sharon L, NP, 325 mg at 01/05/19 1129 .  senna-docusate (Senokot-S) tablet 1 tablet, 1 tablet, Oral, QHS PRN, Amie Portland, MD .  sertraline (ZOLOFT) tablet 100 mg, 100 mg, Oral, QHS, Biby, Sharon L, NP, 100 mg at 01/04/19 2103  Patients Current Diet:     Diet Order                  Diet Heart Room service appropriate? Yes; Fluid consistency: Thin  Diet effective now               Precautions / Restrictions Precautions Precautions: Fall Restrictions Weight Bearing Restrictions: No   Has the patient had 2 or more falls or a fall with injury in the past year?No  Prior Activity Level Community (5-7x/wk): Independent; cares for 4 children; stay at home [place] / [place] Equipment: None  Prior Device Use: Indicate devices/aids used by the patient prior to current illness, exacerbation or injury? 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  Independent  Indoor Mobility: Did the patient need assistance with walking from room to room (with or without device)? Independent  Stairs: Did the patient need assistance with internal or external stairs (with or without device)? Independent  Functional Cognition: Did the patient need help planning regular tasks such as shopping or remembering to take medications? Independent  Current Functional Level Cognition  Arousal/Alertness: Awake/alert Overall Cognitive Status: Impaired/Different from baseline Current Attention Level: Sustained Orientation Level: Oriented X4 Following Commands: Follows one step commands consistently, Follows one step commands with increased time Safety/Judgement: Decreased awareness of safety, Decreased awareness of deficits General Comments: pt with very impaired co-ordination, delayed processing  Attention: Alternating, Sustained Sustained Attention: Appears intact Alternating Attention: Impaired Memory: Appears intact Awareness: Appears intact Problem Solving: Appears intact Executive Function: Organizing, Self Correcting Organizing: Impaired Organizing Impairment: Functional complex, Verbal complex Self Correcting: Impaired Self Correcting Impairment: Functional complex, Verbal complex Safety/Judgment: (unable to assess)    Extremity Assessment (includes Sensation/Coordination)  Upper Extremity Assessment: Defer to OT evaluation RUE Deficits / Details: grossly 4+/5 MMT, dysmetric  RUE Coordination: decreased fine motor, decreased gross motor LUE Deficits / Details: grossly 4+/5, appears slightly dysmetric but better than R UE  LUE Coordination: decreased gross motor  Lower Extremity Assessment: RLE deficits/detail RLE  Deficits / Details: MMT testing at 5/5 grossly  however co-oridantion an motor planning/sequencing significantly impaired RLE Coordination: decreased gross motor(ataxic)    ADLs  Overall ADL's : Needs assistance/impaired Grooming: Wash/dry hands, Minimal assistance, Standing(+2 safety standing ) Upper Body Bathing: Minimal assistance, Sitting Lower Body Bathing: Moderate assistance, Sit to/from stand, +2 for safety/equipment, +2 for physical assistance Upper Body Dressing : Moderate assistance, Sitting Lower Body Dressing: Moderate assistance, +2 for physical assistance, +2 for safety/equipment, Sit to/from stand Toilet Transfer: Minimal assistance, Moderate assistance, +2 for physical assistance, +2 for safety/equipment, Ambulation, Grab bars, Regular Toilet Toilet Transfer Details (indicate cue type and reason): patient requires +2 for safety, mod assist during ambulation but min assist for transfers  Toileting- Clothing Manipulation and Hygiene: Moderate assistance, +2 for physical assistance, +2 for safety/equipment, Sit to/from stand Toileting - Clothing Manipulation Details (indicate cue type and reason): assist with clothing mgmt down and steadying assist for 0 hand support to manage clohting up  Functional mobility during ADLs: Moderate assistance, +2 for physical assistance, +2 for safety/equipment, Cueing for safety, Cueing for sequencing    Mobility  Overal bed mobility: Needs Assistance Bed Mobility: Rolling, Supine to Sit Rolling: Supervision Supine to sit: Min guard, HOB elevated, +2 for safety/equipment General bed mobility comments: sidelying upon entry, multimodal cueing but able to roll in bed with supervision .  Needed cues to slow down due to impulsivitiy.     Transfers  Overall transfer level: Needs assistance Equipment used: 2 person hand held assist, Rolling walker (2 wheeled) Transfers: Sit to/from Stand Sit to Stand: Min assist, +2 safety/equipment, +2  physical assistance General transfer comment: min assist with HHA x2, attempted with RW with pt performing sit to stand with RW with min guard x2 for safety and requiring vc for hand placement    Ambulation / Gait / Stairs / Wheelchair Mobility  Ambulation/Gait Ambulation/Gait assistance: Min assist, +2 physical assistance, +2 safety/equipment Gait Distance (Feet): 300 Feet Assistive device: 2 person hand held assist, Rolling walker (2 wheeled) Gait Pattern/deviations: Step-through pattern, Decreased stride length, Ataxic, Narrow base of support General Gait Details: pt ambulated with occluded glasses and progressed to two person HHA min assist. Pt ambulated  with RW to assess for improvement with balance and coordination and gait pattern improved with RW. Pt required cuing to widen her base of support. Husband followed with chair behind for safety. Pt continues to have impaired RLE coordination and motor planning during ambulation however it is improved since yesterday Gait velocity: decreased Gait velocity interpretation: <1.8 ft/sec, indicate of risk for recurrent falls    Posture / Balance Dynamic Sitting Balance Sitting balance - Comments: min guard for safety dynamically  Balance Overall balance assessment: Needs assistance Sitting-balance support: Feet supported, No upper extremity supported Sitting balance-Leahy Scale: Fair Sitting balance - Comments: min guard for safety dynamically  Standing balance support: Bilateral upper extremity supported Standing balance-Leahy Scale: Poor Standing balance comment: reliant on UE and external support    Special needs/care consideration BiPAP/CPAP n/a CPM n/a Continuous Drip IV n/a Dialysis n/a Life Vest n/a Oxygen n/a Special Bed n/a Trach Size n/a Wound Vac n/a Skin intact Bowel mgmt: no LBM documented Bladder mgmt: continent Diabetic mgmt Hgb A1c 5.7 Patient with history of panic attacks   Previous Home Environment Living  Arrangements: Spouse/significant other, Children  Lives With: Spouse(4 children) Available Help at Discharge: Family, Available 24 hours/day Type of Home: House Home Layout: Two level Alternate Level Stairs-Number of Steps: flight Home Access: Stairs to enter Technical brewer of Steps: 5 Bathroom Shower/Tub: Multimedia programmer: Standard Bathroom Accessibility: Yes How Accessible: Accessible via walker Home Care Services: No  Discharge Living Setting Plans for Discharge Living Setting: Patient's home, Lives with (comment)(spouse and 4 children) Type of Home at Discharge: House Discharge Home Layout: Two level Alternate Level Stairs-Number of Steps: flight Discharge Home Access: Stairs to enter Technical brewer of Steps: 5 Discharge Bathroom Shower/Tub: Walk-in shower Discharge Bathroom Toilet: Standard Discharge Bathroom Accessibility: Yes How Accessible: Accessible via walker Does the patient have any problems obtaining your medications?: No  Social/Family/Support Systems Patient Roles: Spouse, Parent Contact Information: [name], spouse Anticipated Caregiver: spouse and other family members Anticipated Caregiver's Contact Information: [phone] Ability/Limitations of Caregiver: spouse works for Father in [employer] Availability: 24/7 Discharge Plan Discussed with Primary Caregiver: Yes Is Caregiver In Agreement with Plan?: Yes Does Caregiver/Family have Issues with Lodging/Transportation while Pt is in Rehab?: No  Goals/Additional Needs Patient/Family Goal for Rehab: Mod I PT, OT, and SLP Expected length of stay: ELOS 7 to 10 days Pt/Family Agrees to Admission and willing to participate: Yes Program Orientation Provided & Reviewed with Pt/Caregiver Including Roles  & Responsibilities: Yes  Decrease burden of Care through IP rehab admission: n/a  Possible need for SNF placement upon discharge: not anticipated  Patient Condition:   The patient's medical and functional status has changed since the consult dated 01/03/2019 in which the Rehabilitation physician determined and documented that the patient's condition is appropriate for intensive rehabilitative care in an inpt rehab facility. See History of present Illness for medical update. Functional changes overall are min to mod assist. Patient's  medical and functional status have been discussed with the Rehabilitation physician and patient remains appropriate for the inpatient Rehabilitation admission.  We will plan admit 01/06/2019.  Preadmission Screen Completed By:  Tonya Burke RN MSN, 01/05/2019 2:04 PM ______________________________________________________________________   Discussed status with Dr. Naaman Plummer on 01/06/2019 at 0800 and received telephone approval for admission Saturday 01/06/2019  Admission Coordinator:  Tonya Burke RN MSN, time 0800 Date 01/06/2019       Cosigned by: Meredith Staggers, MD at 01/06/2019 10:45 AM  Revision History

## 2019-01-08 NOTE — Progress Notes (Signed)
[facility] PHYSICAL MEDICINE & REHABILITATION PROGRESS NOTE   Subjective/Complaints:  No issues overnite. Discussed ambulation with PT, leans to R Double vision to Left more so than to the right  Good BM yesterday  ROS: Patient denies CP, SOB, N/V/D Objective:   No results found. Recent Labs    01/08/19 0728  WBC 10.6*  HGB 14.3  HCT 42.9  PLT 349   No results for input(s): NA, K, CL, CO2, GLUCOSE, BUN, CREATININE, CALCIUM in the last 72 hours.  Intake/Output Summary (Last 24 hours) at 01/08/2019 0801 Last data filed at 01/07/2019 1700 Gross per 24 hour  Intake 920 ml  Output -  Net 920 ml     Physical Exam: Vital Signs Blood pressure 126/88, pulse 80, temperature 98.9 F (37.2 C), temperature source Oral, resp. rate 18, height 5\' 4"  (1.626 m), weight 73.3 kg, last menstrual period 12/17/2018, SpO2 98 %. Constitutional: No distress . Vital signs reviewed. HEENT: EOMI, oral membranes moist Neck: supple Cardiovascular: RRR without murmur. No JVD    Respiratory: CTA Bilaterally without wheezes or rales. Normal effort    GI: BS +, non-tender, non-distended  Musculoskeletal:Normal range of motion.  General: No tenderness,deformityor edema.  Comments:  Neurological: She isalertand oriented to person, place, and time.   Good insight and awareness.  Normal language and speech.  Tracks to all visual fields with some delay in the left eye more than right.Diplopia on gaze to left   2-3 beats of nystagmus with lateral gaze to either side.  . Strength 4/5 RUE 5/5 LUE , RLE 4 to 4+/5--which is unchanged.  LLE 5/5. Decreased fmc RUE more than left with dysmetria and mild ataxia. Senses pain in all 4's.    Skin: Skin iswarmand dry.  Psychiatric: Pleasant and cooperative..     Assessment/Plan: 1. 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  Physiatrist is providing close team supervision and 24 hour management of active medical problems listed below.  Physiatrist and rehab team continue to assess barriers to discharge/monitor patient progress toward functional and medical goals  Care Tool:  Bathing    Body parts bathed by patient: Right arm, Left arm, Chest, Abdomen, Front perineal area, Buttocks, Right upper leg, Left upper leg, Right lower leg, Left lower leg, Face         Bathing assist Assist Level: Supervision/Verbal cueing     Upper Body Dressing/Undressing Upper body dressing   What is the patient wearing?: Pull over shirt    Upper body assist Assist Level: Supervision/Verbal cueing    Lower Body Dressing/Undressing Lower body dressing      What is the patient wearing?: Underwear/pull up     Lower body assist Assist for lower body dressing: Contact Guard/Touching assist     Toileting Toileting Toileting Activity did not occur (Clothing management and hygiene only): Refused  Toileting assist Assist for toileting: Contact Guard/Touching assist     Transfers Chair/bed transfer  Transfers assist     Chair/bed transfer assist level: Minimal Assistance - Patient > 75%     Locomotion Ambulation   Ambulation assist      Assist level: Moderate Assistance - Patient 50 - 74% Assistive device: (no AD) Max distance: 150 ft   Walk 10 feet activity   Assist     Assist level: Moderate Assistance - Patient - 50 - 74%  Walk 50 feet activity   Assist    Assist level: Moderate Assistance - Patient - 50 - 74%      Walk 150 feet activity   Assist    Assist level: Moderate Assistance - Patient - 50 - 74%      Walk 10 feet on uneven surface  activity   Assist     Assist level: Moderate Assistance - Patient - 50 - 74%     Wheelchair     Assist Will patient use wheelchair at discharge?: No              Wheelchair 50 feet with 2 turns activity    Assist            Wheelchair 150 feet activity     Assist          Medical Problem List and Plan: 1.Decreased functional mobility with ataxic gait and associated dizziness, nausea, headachesecondary to 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 DVT Prophylaxis/Anticoagulation: SCDs. Monitor for any signs of DVT 3. Pain Management:Tylenol as needed Consider adjustments to medications as necessary.  Patient is satisfied with control at present and does not want to add further medication at this time. 4. Mood/ADD/panic attacks:Zoloft 100 mg daily at bedtime -team and family to provide ego support  -trazodone prn for sleep 5. Neuropsych: This patientiscapable of making decisions on herown behalf.Hx anxiety but no signs of worsening  6. Skin/Wound Care:Routine skin checks 7. Fluids/Electrolytes/Nutrition:Routine in and out's with follow-up chemistries -encourage PO intake 8. Elevated blood pressure: Monitor with increased mobility 9. Constipation: Continue to augment bowel regimen -Had only small hard bowel movement with sorbitol and Dulcolax suppository this morning.  -We will try soapsuds enema multiple BMs  10. SNK:NLZJ improved   Vitals:   01/07/19 1916 01/08/19 0542  BP: (!) 147/98 126/88  Pulse: 79 80  Resp: 18 18  Temp: 98.2 F (36.8 C) 98.9 F (37.2 C)  SpO2: 98% 98%    LOS: 2 days A FACE TO FACE EVALUATION WAS PERFORMED  Charlett Blake 01/08/2019, 8:01 AM

## 2019-01-08 NOTE — Care Management Note (Signed)
Inpatient [facility] Individual Statement of Services  Patient Name:  [name]  Date:  01/08/2019  Welcome to the [facility].  Our goal is to provide you with an individualized program based on your diagnosis and situation, designed to meet your specific needs.  With this comprehensive rehabilitation program, you will be expected to participate in at least 3 hours of rehabilitation therapies Monday-Friday, with modified therapy programming on the weekends.  Your rehabilitation program will include the following services:  Physical Therapy (PT), Occupational Therapy (OT), Speech Therapy (ST), 24 hour per day rehabilitation nursing, Therapeutic Recreaction (TR), Neuropsychology, Case Management (Social Worker), Rehabilitation Medicine, Nutrition Services and Pharmacy Services  Weekly team conferences will be held on Wednesday to discuss your progress.  Your Social Worker will talk with you frequently to get your input and to update you on team discussions.  Team conferences with you and your family in attendance may also be held.  Expected length of stay: 7-9 days  Overall anticipated outcome: independent with device  Depending on your progress and recovery, your program may change. Your Social Worker will coordinate services and will keep you informed of any changes. Your Social Worker's name and contact numbers are listed  below.  The following services may also be recommended but are not provided by the [facility] will be made to provide these services after discharge if needed.  Arrangements include referral to agencies that provide these services.  Your insurance has been verified to be:  [place] Medicaid Your primary doctor is:  Jill Alexanders  Pertinent information will be shared with your  doctor and your insurance company.  Social Worker:  Ovidio Kin, [place] or ([phone]  Information discussed with and copy given to patient by: Elease Hashimoto, 01/08/2019, 10:19 AM

## 2019-01-08 NOTE — Evaluation (Signed)
Speech Language Pathology Assessment and Plan  Patient Details  Name: [name] MRN: [id number] Date of Birth: April 30, 1981  SLP Diagnosis: N/A Rehab Potential: N/A ELOS: N/A    Today's Date: 01/08/2019 SLP Individual Time: 2956-2130 SLP Individual Time Calculation (min): 45 min   Problem List:  Patient Active Problem List   Diagnosis Date Noted  . Vertebral artery dissection ([place])   . Elevated blood pressure reading   . Major depressive disorder   . Panic disorder   . CVA (cerebral vascular accident) ([place]) 01/02/2019  . Acute ischemic stroke ([place]) 01/02/2019  . S/P cesarean section 05/05/2016  . S/P tubal ligation 05/05/2016  . Term pregnancy, repeat 05/04/2016  . Unspecified vitamin D deficiency 12/18/2012  . ADHD (attention deficit hyperactivity disorder) 11/26/2011  . Dysthymia 11/26/2011   Past Medical History:  Past Medical History:  Diagnosis Date  . ADD (attention deficit disorder)   . Exercise-induced asthma   . Hx of varicella   . Kidney stones   . S/P cesarean section 05/05/2016  . S/P tubal ligation 05/05/2016  . Term pregnancy, repeat 05/04/2016   Past Surgical History:  Past Surgical History:  Procedure Laterality Date  . BACK SURGERY     scoliosis age 32  . BILATERAL SALPINGECTOMY Bilateral 05/05/2016   Procedure: BILATERAL SALPINGECTOMY;  Surgeon: Janyth Contes, MD;  Location: [facility];  Service: Obstetrics;  Laterality: Bilateral;  . CESAREAN SECTION    . CESAREAN SECTION N/A 05/05/2016   Procedure: CESAREAN SECTION;  Surgeon: Janyth Contes, MD;  Location: [facility];  Service: Obstetrics;  Laterality: N/A;  . CHOLECYSTECTOMY    . HAND SURGERY    . HERNIA REPAIR    . KIDNEY STONE SURGERY     R--surgery; L lithotripsy.  Dr. Gaynelle Arabian    Assessment / Plan / Recommendation Clinical Impression Patient is a 38 year old right-handed female with history of ADHD maintained on Adderall and Xanax, panic attacks,  exercise-induced asthma. Per chart reviewand patient,patient lives with spouse. Independent prior to admission. Two-level home 5 steps to entry and bedroom on main level. She has 4 children ages 65 through 17. Family planning assistance as needed. Presented to2/11/2020with lethargy, dizziness and anxiety with bouts of nausea as well as headache and neck pain. Patient could not recall any type of fall or neck hyperextension injury prior to admission. 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 Associated trace petechial hemorrhage in the occipital poles without frank hemorrhagic conversion. 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 Probable distal bilateral P3-P4 occlusion. Urine drug screen positive foramphetamines. MRI the brain showed multifocal areas of acute infarction, predominantly affecting the right cerebellum reflecting right vertebral dissection but also areas of acute infarction elsewhere in the posterior circulation notably both thalami, left temporal lobe. Noted significant mass effect on the outlet foramina the fourth ventricle without current features of hydrocephalus. [facility] CT reviewed, scan with slightly worsening compression of fourth ventricle, read pending. Patient completed a course of 3% normalsaline. Maintain on aspirin for CVA prophylaxis. Tolerating a regular diet. Therapy evaluations completed with recommendations of physical medicine rehabilitation consult. Patient was admitted for a comprehensive rehabilitation program.Patient was planned to be admitted today, however, due to increase in fluid on CT, will hold per Neurology and monitor  overnight.Patient [facility].  Patient administered the Cerebellar Cognitive Affective/Schmahmann Syndrome Scale and failed 4/10 subtests, all of which had time constraints and focused on fluency. Throughout subtests, patient appeared anxious and constantly apologizing for what she perceived as a poor performance with decreased attention to task at hand. Patient's cognitive functioning appeared [facility] for all other subtests/tasks administered. Patient with h/o ADHD and anxiety at baseline which she was taking medications for prior to hospitalization and are no longer currently on. Suspect baseline deficits impacted function and that impairments are not truly cognitive based in nature and will improve when medications are resumed as physician recommends. Patient is at her cognitive baseline, therefore, skilled SLP intervention/follow-up is not warranted at this time. Patient's husband present and education complete. Patient's husband verbalized understanding of all information and recommendations and is in agreement. PA made aware of situation as well.    Skilled Therapeutic Interventions          Administered a cognitive-linguistic evaluation, please see above for details.   SLP Assessment  Patient does not need any further Speech Lanaguage Pathology Services    Recommendations  Oral Care Recommendations: Oral care BID Recommendations for Other Services: Neuropsych consult Patient destination: Home Follow up Recommendations: None Equipment Recommended: None recommended by SLP    SLP Frequency N/A  SLP Duration  SLP Intensity  SLP Treatment/Interventions N/A  N/A  N/A   Pain Pain Assessment Pain Scale: 0-10 Pain Score: 3/10 Pain Type: Acute pain Pain Location: Head Pain Orientation: Posterior Pain Descriptors / Indicators: Headache Pain Frequency: Intermittent Pain Onset: Gradual Patients Stated Pain Goal: 0 Pain Intervention(s): Medication (See eMAR)   Short Term Goals: N/A   Refer to Care Plan  for Long Term Goals  Recommendations for other services: Neuropsych  Discharge Criteria: Patient will be discharged from SLP if patient refuses treatment 3 consecutive times without medical reason, if treatment goals not met, if there is a change in medical status, if patient makes no progress towards goals or if patient is discharged from hospital.  The above assessment, treatment plan, treatment alternatives and goals were discussed and mutually agreed upon: by patient and by family  Mayana Irigoyen 01/08/2019, 11:00 AM

## 2019-01-08 NOTE — Progress Notes (Signed)
Occupational Therapy Session Note  Patient Details  Name: [name] MRN: [id number] Date of Birth: 1981-07-04  Today's Date: 01/08/2019 OT Individual Time: 1000-1111 OT Individual Time Calculation (min): 71 min    Short Term Goals: Week 1:  OT Short Term Goal 1 (Week 1): STGs=LTGs due to ELOS  Skilled Therapeutic Interventions/Progress Updates:    Session focused on bathing and dressing at shower level. Pt completed bed mobility with (S), moderate cueing for arousal/alertness. Pt completed functional mobility into bathroom with CGA. Pt completed toileting with (S). Pt doffed all clothing with guiding cues for safety to complete seated. Pt sat on TTB and completed hair washing overhead bimanually with good accuracy. Pt required min cueing for positioning, pacing, and attention to R UE during leg shaving. Pt able to complete all other bathing seated with lateral leans with (S) only. Pt dried off with (S) and donned shirt with (S), CGA for pants. Pt completed functional mobility back out of bathroom and sat in front of mirror to complete grooming task. Pt was then transported to [facility] room where R UE accuracy in reaching was focused on. Several cues throughout re technique and rt attention.  Pt progress and condition insight reviewed. Pt returned to room, min cueing for w/c propulsion bimanually, with frequent veering off to the R. Pt returned to supine in bed and was left with all needs met, husband present.   Therapy Documentation Precautions:  Precautions Precautions: Fall Restrictions Weight Bearing Restrictions: No   Pain:  No pain reported.    Therapy/Group: Individual Therapy  Curtis Sites 01/08/2019, 7:18 AM

## 2019-01-08 NOTE — Progress Notes (Signed)
Occupational Therapy Session Note  Patient Details  Name: [name] MRN: [id number] Date of Birth: 07-Dec-1980  Today's Date: 01/08/2019 OT Individual Time: 1400-1500 OT Individual Time Calculation (min): 60 min    Short Term Goals: Week 1:  OT Short Term Goal 1 (Week 1): STGs=LTGs due to ELOS  Skilled Therapeutic Interventions/Progress Updates:    Pt seen for OT session focusing on functional transfers and R neuro re-ed. Pt asleep in supine upon arrival with husband present. Required increased time for arousal, however, once awake, pleasant and agreeable to tx session, denying pain. She donned shoes seated EOB with supervision, with increased time able to use B hands in order to tie shoes. She ambulated within room with CGA using RW. Several instances of running into environmental obstacles on R, able to self-correct error. Completed toileting task with guarding assist.  She ambulated to therapy gym with RW and min A. VCs for maintaining straight line ambulation and staying in middle of RW.  In ADL apartment, completed simulated walk-in shower transfer. Following demonstration and cuing by therapist, pt able to return demonstrate with CGA, easily able to clear R LE over threshold. Educated regarding DME for showering task, plans to install hand held shower at home bathroom. Sit<>stand from low soft surface couch and recliner with supervision, able to stand without UE support. In therapy gym, completed dynamic standing activity, folding towels from standing position using B UEs simultaneously. Close supervision for balance. Pt required to reach behind her on R ot retrieve towels to fold, completed with CGA. Seated rest breaks required btwn trials. Standing matching task, hitting targets with R UE, demonstrating some ataxia with gross motor movements. Pt ambulated back to room in same manner as described above. Pt left in supine on bed at end of session with bed alarm on and husband  present. Education provided throughout session regarding role of OT, stroke recovery, DME, continuum of care, and d/c planning.   Therapy Documentation Precautions:  Precautions Precautions: Fall Restrictions Weight Bearing Restrictions: No Pain: Pain Assessment Pain Score: 0-No pain   Therapy/Group: Individual Therapy  Nickolaos Brallier L 01/08/2019, 7:12 AM

## 2019-01-08 NOTE — Progress Notes (Signed)
Tonya Blake, MD  Physician  Physical Medicine and Rehabilitation  Consult Note  Addendum  Date of Service:  01/03/2019 11:09 AM       Related encounter: ED to Hosp-Admission (Discharged) from 01/02/2019 in [facility] NEURO/TRAUMA/SURGICAL ICU      Expand All Collapse All    Show:Clear all [x] Manual[x] Template[] Copied  Added by: [x] Angiulli, Lavon Paganini, PA-C[x] Kirsteins, Luanna Salk, MD  [] Hover for details      Physical Medicine and Rehabilitation Consult Reason for Consult:  Decreased functional mobility Referring Physician:   [facility]   HPI: [name] is a 38 y.o.right handed female with history of ADD maintained on Adderall and Xanax, panic attacks, exercise-induced asthma. Per chart review patient lives with spouse. Independent prior to admission. Two-level home with 5 steps to entry. She has 4 children ages 30 through 52. Family planning assistance as needed. Presented to 01/02/2019 with lethargy, dizziness and anxiety with bouts of nausea. 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 Associated trace petechial hemorrhage in the occipital poles without frank hemorrhagic transformation. 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 Probable distal bilateral P3-P4 occlusion. Urine drug screen positive amphetamines. MRI of the brain is pending. Neurosurgery follow-up currently with conservative care.  Patient is still on 3% normal saline.Currently on aspirin for CVA prophylaxis. Tolerating a regular diet. Therapy evaluation completed with recommendations of physical medicine rehabilitation consult.  Patient is lethargic but can be awakened to voice and physical stim.  Husband at bedside very helpful.  Patient does not recall any type of fall  or neck hyperextension injury prior to admission.  Review of Systems  Constitutional: Positive for malaise/fatigue. Negative for fever.  HENT: Negative for hearing loss.   Eyes: Negative for blurred vision and double vision.  Respiratory: Negative for cough and shortness of breath.   Cardiovascular: Negative for chest pain and palpitations.  Gastrointestinal: Positive for constipation. Negative for nausea and vomiting.  Genitourinary: Negative for dysuria and hematuria.  Musculoskeletal: Positive for neck pain.  Skin: Negative for rash.  Neurological: Positive for dizziness and headaches. Negative for seizures.  Psychiatric/Behavioral:       Anxiety and panic attacks  All other systems reviewed and are negative.      Past Medical History:  Diagnosis Date  . ADD (attention deficit disorder)   . Exercise-induced asthma   . Hx of varicella   . Kidney stones   . S/P cesarean section 05/05/2016  . S/P tubal ligation 05/05/2016  . Term pregnancy, repeat 05/04/2016        Past Surgical History:  Procedure Laterality Date  . BACK SURGERY     scoliosis age 69  . BILATERAL SALPINGECTOMY Bilateral 05/05/2016   Procedure: BILATERAL SALPINGECTOMY;  Surgeon: Janyth Contes, MD;  Location: [facility];  Service: Obstetrics;  Laterality: Bilateral;  . CESAREAN SECTION    . CESAREAN SECTION N/A 05/05/2016   Procedure: CESAREAN SECTION;  Surgeon: Janyth Contes, MD;  Location: [facility];  Service: Obstetrics;  Laterality: N/A;  . CHOLECYSTECTOMY    . HAND SURGERY    . HERNIA REPAIR    . KIDNEY STONE SURGERY     R--surgery; L lithotripsy.  Dr. Gaynelle Arabian        Family History  Problem Relation Age of Onset  .  Cancer Mother   . Cancer Maternal Aunt   . Cancer Maternal Grandmother    Social History:  reports that she has never smoked. She has never used smokeless tobacco. She reports that she does not drink alcohol or use  drugs. Allergies: No Known Allergies       Medications Prior to Admission  Medication Sig Dispense Refill  . [START ON 01/10/2019] ADDERALL XR 20 MG 24 hr capsule TAKE 1 CAPSULE BY MOUTH 2 TIMES DAILY. EFFECTIVE 08/14/18 (Patient taking differently: Take 20 mg by mouth 2 (two) times daily. ) 60 capsule 0  . amphetamine-dextroamphetamine (ADDERALL) 10 MG tablet TAKE 1 TABLET (10 MG TOTAL) BY MOUTH DAILY. 30 tablet 0  . ibuprofen (ADVIL,MOTRIN) 200 MG tablet Take 800 mg by mouth every 6 (six) hours as needed for moderate pain.    Marland Kitchen sertraline (ZOLOFT) 100 MG tablet Take 100 mg by mouth at bedtime.     . ALPRAZolam (XANAX) 0.25 MG tablet Take 1 tablet (0.25 mg total) by mouth 2 (two) times daily as needed for anxiety. (Patient not taking: Reported on 11/02/2018) 20 tablet 0  . amoxicillin (AMOXIL) 875 MG tablet Take 1 tablet (875 mg total) by mouth 2 (two) times daily. (Patient not taking: Reported on 01/02/2019) 20 tablet 0  . amphetamine-dextroamphetamine (ADDERALL XR) 20 MG 24 hr capsule Take 1 capsule (20 mg total) by mouth 2 (two) times daily. (Patient not taking: Reported on 01/02/2019) 60 capsule 0  . amphetamine-dextroamphetamine (ADDERALL XR) 20 MG 24 hr capsule Take 1 capsule (20 mg total) by mouth 2 (two) times daily. 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, drives, has 4 children (ages 36-15) Functional Status:   Mobility: Bed Mobility Overal bed mobility: Needs Assistance Bed Mobility: Supine to Sit Supine to sit: Min assist, +2 for safety/equipment, HOB elevated General bed mobility comments: min assist to ascend trunk, transfer towards L side of bed with cueing for safety and sequencing  Transfers Overall transfer level: Needs assistance Equipment used: 2 person hand held assist Transfers: Sit to/from Stand Sit to Stand: Min assist, +2 safety/equipment, +2 physical assistance General transfer comment: min assist to power up and steady with +2, B UE hand support  required  ADL: ADL Overall ADL's : Needs assistance/impaired Grooming: Wash/dry hands, Minimal assistance, Standing(+2 safety standing ) Upper Body Bathing: Minimal assistance, Sitting Lower Body Bathing: Moderate assistance, Sit to/from stand, +2 for safety/equipment, +2 for physical assistance Upper Body Dressing : Moderate assistance, Sitting Lower Body Dressing: Moderate assistance, +2 for physical assistance, +2 for safety/equipment, Sit to/from stand Toilet Transfer: Minimal assistance, Moderate assistance, +2 for physical assistance, +2 for safety/equipment, Ambulation, Grab bars, Regular Toilet Toilet Transfer Details (indicate cue type and reason): patient requires +2 for safety, mod assist during ambulation but min assist for transfers  Toileting- Clothing Manipulation and Hygiene: Moderate assistance, +2 for physical assistance, +2 for safety/equipment, Sit to/from stand Toileting - Clothing Manipulation Details (indicate cue type and reason): assist with clothing mgmt down and steadying assist for 0 hand  support to manage clohting up  Functional mobility during ADLs: Moderate assistance, +2 for physical assistance, +2 for safety/equipment, Cueing for safety, Cueing for sequencing  Cognition: Cognition Overall Cognitive Status: Impaired/Different from baseline Orientation Level: Oriented to person, Oriented to place,  Disoriented to situation, Disoriented to time Cognition Arousal/Alertness: Awake/alert(initally lethargic but improves during session) Behavior During Therapy: Flat affect Overall Cognitive Status: Impaired/Different from baseline Area of Impairment: Orientation, Attention, Memory, Following commands, Safety/judgement, Awareness, Problem solving Orientation Level: Time, Situation(reports 2000) Current Attention Level: Sustained Memory: Decreased recall of precautions, Decreased short-term memory Following Commands: Follows one step commands consistently, Follows one step commands with increased time Safety/Judgement: Decreased awareness of safety, Decreased awareness of deficits Awareness: Emergent Problem Solving: Slow processing, Decreased initiation, Difficulty sequencing, Requires verbal cues, Requires tactile cues General Comments: patient requires increased time to process and follow commands, cueing for problem sovling and awareness; some expressive difficulties, appears with limited recall of situation, tends to repeat "situtation" of what she heard last (initally reporting back pain, then BP, then headache)    Blood pressure 130/89, pulse (!) 52, temperature 98.7 F (37.1 C), temperature source Oral, resp. rate 18, height 5\' 4"  (1.626 m), weight 86.2 kg, last menstrual period 12/17/2018, SpO2 98 %. Physical Exam  Vitals reviewed. HENT:  Head: Normocephalic.  Eyes:  Pupils reactive to light  Neck: Normal range of motion. Neck supple. No thyromegaly present.  Cardiovascular: Normal rate and regular rhythm.  Respiratory: Effort normal and breath sounds normal. No respiratory distress.  GI: Soft. Bowel sounds are normal. She exhibits no distension.  Neurological: She is alert.  Follows commands  Provides her name ,age ,place .Marland KitchenFair awareness of deficits.  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 with diplopia on lateral gaze Sensation reported as equal to light touch in both upper and lower limbs   Eyes without evidence of nystagmus  Tone is normal without evidence of spasticity   Cranial nerves II- Visual fields are intact to confrontation testing, no blurring of vision III- no evidence of ptosis, upward, downward and medial gaze intact IV- no vertical diplopia or head tilt V- no facial numbness or masseter weakness VI- no pupil abduction weakness VII- no facial droop, good lid closure VII- normal auditory acuity IX- no pharygeal weakness, uvula midline X- no pharyngeal weakness, no hoarseness XI- no trap or SCM weakness XII- no glossal weakness    LabResultsLast24Hours       Results for orders placed or performed during the hospital encounter of 01/02/19 (from the past 24 hour(s))  Rapid urine drug screen (hospital performed)     Status: Abnormal   Collection Time: 01/02/19  7:23 PM  Result Value Ref Range   Opiates NONE DETECTED NONE DETECTED   Cocaine NONE DETECTED NONE DETECTED   Benzodiazepines NONE DETECTED NONE DETECTED   Amphetamines POSITIVE (A) NONE DETECTED   Tetrahydrocannabinol NONE DETECTED NONE DETECTED   Barbiturates NONE DETECTED NONE DETECTED  Ethanol     Status: None   Collection Time: 01/02/19  7:23 PM  Result Value Ref Range   Alcohol, Ethyl (B) <10 <10 mg/dL  Ammonia     Status: None   Collection Time: 01/02/19  7:23 PM  Result Value Ref Range   Ammonia 18 9 - 35 umol/L  Urinalysis, Routine w reflex microscopic  Status: Abnormal   Collection Time: 01/02/19  7:23 PM  Result Value Ref Range   Color, Urine YELLOW YELLOW   APPearance HAZY (A) CLEAR   Specific Gravity, Urine 1.014 1.005 - 1.030   pH 6.0 5.0 - 8.0   Glucose, UA 150 (A) NEGATIVE mg/dL    Hgb urine dipstick MODERATE (A) NEGATIVE   Bilirubin Urine NEGATIVE NEGATIVE   Ketones, ur NEGATIVE NEGATIVE mg/dL   Protein, ur NEGATIVE NEGATIVE mg/dL   Nitrite NEGATIVE NEGATIVE   Leukocytes,Ua NEGATIVE NEGATIVE   RBC / HPF 21-50 0 - 5 RBC/hpf   WBC, UA 0-5 0 - 5 WBC/hpf   Bacteria, UA RARE (A) NONE SEEN   Squamous Epithelial / LPF 0-5 0 - 5   Mucus PRESENT   Lactic acid, plasma     Status: None   Collection Time: 01/02/19  7:24 PM  Result Value Ref Range   Lactic Acid, Venous 1.5 0.5 - 1.9 mmol/L  TSH     Status: None   Collection Time: 01/02/19 10:22 PM  Result Value Ref Range   TSH 1.321 0.350 - 4.500 uIU/mL  T4, free     Status: None   Collection Time: 01/02/19 10:22 PM  Result Value Ref Range   Free T4 0.92 0.82 - 1.77 ng/dL  Sodium     Status: None   Collection Time: 01/02/19 10:22 PM  Result Value Ref Range   Sodium 137 135 - 145 mmol/L  CBG monitoring, ED     Status: Abnormal   Collection Time: 01/03/19  2:59 AM  Result Value Ref Range   Glucose-Capillary 107 (H) 70 - 99 mg/dL  Surgical PCR screen     Status: None   Collection Time: 01/03/19  3:37 AM  Result Value Ref Range   MRSA, PCR NEGATIVE NEGATIVE   Staphylococcus aureus NEGATIVE NEGATIVE  Lipid panel     Status: None   Collection Time: 01/03/19  4:40 AM  Result Value Ref Range   Cholesterol 160 0 - 200 mg/dL   Triglycerides 74 <150 mg/dL   HDL 57 >40 mg/dL   Total CHOL/HDL Ratio 2.8 RATIO   VLDL 15 0 - 40 mg/dL   LDL Cholesterol 88 0 - 99 mg/dL  Hemoglobin A1c     Status: Abnormal   Collection Time: 01/03/19  4:40 AM  Result Value Ref Range   Hgb A1c MFr Bld 5.7 (H) 4.8 - 5.6 %   Mean Plasma Glucose 116.89 mg/dL  Sodium     Status: None   Collection Time: 01/03/19  4:40 AM  Result Value Ref Range   Sodium 140 135 - 145 mmol/L  Sodium     Status: None   Collection Time: 01/03/19  9:46 AM  Result Value Ref Range   Sodium 141 135 - 145 mmol/L       ImagingResults(Last48hours)  Ct Angio Head W Or Wo Contrast  Result Date: 01/02/2019 CLINICAL DATA:  Initial evaluation for acute lethargy, vertigo. EXAM: CT ANGIOGRAPHY HEAD AND NECK TECHNIQUE: Multidetector CT imaging of the head and neck was performed using the standard protocol during bolus administration of intravenous contrast. Multiplanar CT image reconstructions and MIPs were obtained to evaluate the vascular anatomy. Carotid stenosis measurements (when applicable) are obtained utilizing NASCET criteria, using the distal internal carotid diameter as the denominator. CONTRAST:  67mL ISOVUE-370 IOPAMIDOL (ISOVUE-370) INJECTION 76% COMPARISON:  None available. FINDINGS: CT HEAD FINDINGS Brain: Cerebral volume within normal limits. Confluent hypodensity involving the right cerebellum, compatible with  evolving acute ischemic infarct. Possible more mild involvement within the adjacent left cerebellar hemisphere, although not entirely certain. Evolving small acute bilateral PCA territory infarcts, right greater than left. Evolving acute bilateral thalamic infarcts. Trace hyperdensity at the bilateral occipital poles compatible with associated petechial hemorrhage (series 7, image 58). No frank hemorrhagic transformation. Associated mass effect within the right posterior fossa with partial effacement of the fourth ventricle and basilar cistern crowding. Fourth ventricle remains patent at this time. No obstructive hydrocephalus. Remainder of the brain is normal in appearance. No mass lesion or midline shift. No extra-axial fluid collection. Vascular: No hyperdense vessel. Skull: Scalp soft tissues and calvarium within normal limits. Sinuses: Paranasal sinuses and mastoids are clear. Orbits: Globes and orbital soft tissues within normal limits. Review of the MIP images confirms the above findings CTA NECK FINDINGS Aortic arch: Partially visualized aortic arch normal in caliber with normal branch  pattern. No hemodynamically significant stenosis about the origin of the great vessels. Visualized subclavian arteries patent. Right carotid system: Right common and internal carotid arteries widely patent without stenosis, dissection, or occlusion. Left carotid system: Left common and internal carotid arteries widely patent without stenosis, dissection, or occlusion. Vertebral arteries: Both vertebral arteries arise from the subclavian arteries. Left vertebral artery widely patent and normal in appearance. Right vertebral artery widely patent and normal in appearance throughout its P2 segment. There is multifocal intimal irregularity with associated moderate stenosis involving the left V3 segment at the level of the C1 transverse foramen (series 10, image 33). Finding compatible with acute arterial dissection. Subtle probable flap noted just distally within the right V3 segment just prior to crossing into the cranial vault (series 12, image 135). Skeleton: No acute osseous abnormality. No discrete lytic or blastic osseous lesions. Pronounced thoracic dextroscoliosis with bilateral Harrington rod fixation partially visualized. Other neck: No other acute soft tissue abnormality within the neck. Upper chest: Partially visualized upper chest demonstrates no acute finding. Review of the MIP images confirms the above findings CTA HEAD FINDINGS Anterior circulation: Internal carotid arteries widely patent to the termini without stenosis. A1 segments, anterior communicating artery, and anterior cerebral arteries widely patent. M1 segments widely patent bilaterally. Normal MCA bifurcations. Distal MCA branches well perfused and symmetric. Posterior circulation: Probable small dissection flap at the right V3/V4 junction as the right vertebral crosses into the cranial vault as above. Right V4 segment otherwise normal in appearance and caliber without acute abnormality. Left vertebral widely patent to the vertebrobasilar  junction without stenosis. Patent left PICA. Right PICA not seen, and may be occluded given the acute right cerebellar infarct. Probable short fenestration within the proximal basilar artery noted (series 10, image 273). Basilar patent distally without significant stenosis or other acute finding. Superior cerebral arteries are patent proximally. Both of the posterior cerebral artery supplied via the basilar. PCAs patent proximally. Suspected bilateral distal P3/P4 occlusions (series 12, image 126 on the left, image 140 on the right). Venous sinuses: Patent. Anatomic variants: None significant. Delayed phase: No abnormal enhancement. Review of the MIP images confirms the above findings IMPRESSION: CT HEAD IMPRESSION 1. 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 Associated trace petechial hemorrhage at the occipital poles without frank hemorrhagic transformation. 2. Associated mass effect within the right posterior fossa with partial effacement of the right basilar cisterns and fourth ventricle. No obstructive hydrocephalus at this time. CTA HEAD AND NECK IMPRESSION 1. Acute right vertebral artery dissection,  involving the right V3 segment at the level of the right C1 transverse foramen, with probable small dissection flap just distally at the right V3/V4 junction. Associated moderate stenosis. 2. 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 3. Otherwise negative CTA of the head and neck. Normal left vertebral artery. 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 Critical Value/emergent results were called by telephone at the time of interpretation on 01/02/2019 at 9:40 pm to Dr. Lennice Sites , who verbally acknowledged these results. Electronically Signed   By: Jeannine Boga M.D.   On: 01/02/2019 21:45   Ct Head Wo Contrast  Result Date:  01/03/2019 CLINICAL DATA:  Stroke follow-up EXAM: CT HEAD WITHOUT CONTRAST TECHNIQUE: Contiguous axial images were obtained from the base of the skull through the vertex without intravenous contrast. COMPARISON:  Head CT 01/02/2019 FINDINGS: Brain: Unchanged appearance of large infarct within the right cerebellar hemisphere with associated narrowing of the fourth ventricle and basal cisterns. Unchanged hypoattenuation within the thalami extending into the cerebral peduncles. Unchanged decreased gray-white differentiation and both occipital lobes. No intraparenchymal hematoma. Possible petechial trace blood products within both occipital lobes are unchanged. Vascular: No abnormal hyperdensity of the major intracranial arteries or dural venous sinuses. No intracranial atherosclerosis. Skull: The visualized skull base, calvarium and extracranial soft tissues are normal. Sinuses/Orbits: No fluid levels or advanced mucosal thickening of the visualized paranasal sinuses. No mastoid or middle ear effusion. The orbits are normal. IMPRESSION: 1. Unchanged appearance of posterior circulation infarcts involving the right cerebellum, both thalami and both occipital lobes. 2. Unchanged posterior fossa edema with narrowing of the basal cisterns and fourth ventricle without hydrocephalus. Electronically Signed   By: Ulyses Jarred M.D.   On: 01/03/2019 04:40   Ct Angio Neck W And/or Wo Contrast  Result Date: 01/02/2019 CLINICAL DATA:  Initial evaluation for acute lethargy, vertigo. EXAM: CT ANGIOGRAPHY HEAD AND NECK TECHNIQUE: Multidetector CT imaging of the head and neck was performed using the standard protocol during bolus administration of intravenous contrast. Multiplanar CT image reconstructions and MIPs were obtained to evaluate the vascular anatomy. Carotid stenosis measurements (when applicable) are obtained utilizing NASCET criteria, using the distal internal carotid diameter as the denominator. CONTRAST:  23mL  ISOVUE-370 IOPAMIDOL (ISOVUE-370) INJECTION 76% COMPARISON:  None available. FINDINGS: CT HEAD FINDINGS Brain: Cerebral volume within normal limits. Confluent hypodensity involving the right cerebellum, compatible with evolving acute ischemic infarct. Possible more mild involvement within the adjacent left cerebellar hemisphere, although not entirely certain. Evolving small acute bilateral PCA territory infarcts, right greater than left. Evolving acute bilateral thalamic infarcts. Trace hyperdensity at the bilateral occipital poles compatible with associated petechial hemorrhage (series 7, image 58). No frank hemorrhagic transformation. Associated mass effect within the right posterior fossa with partial effacement of the fourth ventricle and basilar cistern crowding. Fourth ventricle remains patent at this time. No obstructive hydrocephalus. Remainder of the brain is normal in appearance. No mass lesion or midline shift. No extra-axial fluid collection. Vascular: No hyperdense vessel. Skull: Scalp soft tissues and calvarium within normal limits. Sinuses: Paranasal sinuses and mastoids are clear. Orbits: Globes and orbital soft tissues within normal limits. Review of the MIP images confirms the above findings CTA NECK FINDINGS Aortic arch: Partially visualized aortic arch normal in caliber with normal branch pattern. No hemodynamically significant stenosis about the origin of the great vessels. Visualized subclavian arteries patent. Right carotid system: Right common and internal carotid arteries widely patent without stenosis, dissection, or occlusion. Left carotid  system: Left common and internal carotid arteries widely patent without stenosis, dissection, or occlusion. Vertebral arteries: Both vertebral arteries arise from the subclavian arteries. Left vertebral artery widely patent and normal in appearance. Right vertebral artery widely patent and normal in appearance throughout its P2 segment. There is  multifocal intimal irregularity with associated moderate stenosis involving the left V3 segment at the level of the C1 transverse foramen (series 10, image 33). Finding compatible with acute arterial dissection. Subtle probable flap noted just distally within the right V3 segment just prior to crossing into the cranial vault (series 12, image 135). Skeleton: No acute osseous abnormality. No discrete lytic or blastic osseous lesions. Pronounced thoracic dextroscoliosis with bilateral Harrington rod fixation partially visualized. Other neck: No other acute soft tissue abnormality within the neck. Upper chest: Partially visualized upper chest demonstrates no acute finding. Review of the MIP images confirms the above findings CTA HEAD FINDINGS Anterior circulation: Internal carotid arteries widely patent to the termini without stenosis. A1 segments, anterior communicating artery, and anterior cerebral arteries widely patent. M1 segments widely patent bilaterally. Normal MCA bifurcations. Distal MCA branches well perfused and symmetric. Posterior circulation: Probable small dissection flap at the right V3/V4 junction as the right vertebral crosses into the cranial vault as above. Right V4 segment otherwise normal in appearance and caliber without acute abnormality. Left vertebral widely patent to the vertebrobasilar junction without stenosis. Patent left PICA. Right PICA not seen, and may be occluded given the acute right cerebellar infarct. Probable short fenestration within the proximal basilar artery noted (series 10, image 273). Basilar patent distally without significant stenosis or other acute finding. Superior cerebral arteries are patent proximally. Both of the posterior cerebral artery supplied via the basilar. PCAs patent proximally. Suspected bilateral distal P3/P4 occlusions (series 12, image 126 on the left, image 140 on the right). Venous sinuses: Patent. Anatomic variants: None significant. Delayed phase:  No abnormal enhancement. Review of the MIP images confirms the above findings IMPRESSION: CT HEAD IMPRESSION 1. 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 Associated trace petechial hemorrhage at the occipital poles without frank hemorrhagic transformation. 2. Associated mass effect within the right posterior fossa with partial effacement of the right basilar cisterns and fourth ventricle. No obstructive hydrocephalus at this time. CTA HEAD AND NECK IMPRESSION 1. Acute right vertebral artery dissection, involving the right V3 segment at the level of the right C1 transverse foramen, with probable small dissection flap just distally at the right V3/V4 junction. Associated moderate stenosis. 2. 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 3. Otherwise negative CTA of the head and neck. Normal left vertebral artery. 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 Critical Value/emergent results were called by telephone at the time of interpretation on 01/02/2019 at 9:40 pm to Dr. Lennice Sites , who verbally acknowledged these results. Electronically Signed   By: Jeannine Boga M.D.   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 Does the need for close, 24 hr/day medical supervision in concert with the patient's rehab needs make it unreasonable for this patient to be served in a less intensive setting? Yes 2. Co-Morbidities requiring supervision/potential complications: History of anxiety with panic attacks and attention deficit disorder 3. Due to bladder management, bowel management, safety, skin/wound care, disease management, medication administration, pain management and patient education, does the  patient require 24 hr/day rehab nursing? Yes 4. Does  the patient require coordinated care of a physician, rehab nurse, PT (1-2 hrs/day, 5 days/week), OT (1-2 hrs/day, 5 days/week) and SLP (.5-1 hrs/day, 5 days/week) to address physical and functional deficits in the context of the above medical diagnosis(es)? Yes Addressing deficits in the following areas: balance, endurance, locomotion, strength, transferring, bowel/bladder control, bathing, dressing, feeding, grooming, toileting, cognition and psychosocial support 5. Can the patient actively participate in an intensive therapy program of at least 3 hrs of therapy per day at least 5 days per week? Yes 6. The potential for patient to make measurable gains while on inpatient rehab is excellent 7. Anticipated functional outcomes upon discharge from inpatient rehab are modified independent  with PT, modified independent with OT, modified independent with SLP. 8. Estimated rehab length of stay to reach the above functional goals is: 7-10d 9. Anticipated D/C setting: Home 10. Anticipated post D/C treatments: Stem therapy 11. Overall Rehab/Functional Prognosis: excellent  RECOMMENDATIONS: This patient's condition is appropriate for continued rehabilitative care in the following setting: CIR and Patient will need to be off 3% saline infusion Patient has agreed to participate in recommended program. Potentially Note that insurance prior authorization may be required for reimbursement for recommended care.  Comment: Need to make sure patient's cerebral edema does not worsen off 3% normal saline prior to admission to rehab  "I have personally performed a face to face diagnostic evaluation of this patient.  Additionally, I have reviewed and concur with the physician assistant's documentation above." [name] M.D. [facility] FAAPM&R (Sports Med, Neuromuscular Med) Diplomate Am Board of Electrodiagnostic Med   Elizabeth Sauer 01/03/2019    Revision History                              Routing History

## 2019-01-08 NOTE — IPOC Note (Signed)
Overall Plan of Care [facility]) Patient Details Name: [name] MRN: [id number] DOB: 11/02/1981  Admitting Diagnosis: <principal problem not specified>  Hospital Problems: Active Problems:   Vertebral artery dissection (HCC)     Functional Problem List: Nursing Endurance, Motor, Safety, Perception, Bowel  PT Behavior, Balance, Endurance, Motor, Nutrition, Pain, Safety, Sensory  OT Balance, Vision, Safety, Endurance, Motor  SLP    TR         Basic ADL's: OT Grooming, Bathing, Dressing, Toileting, Eating     Advanced  ADL's: OT Simple Meal Preparation, Light Housekeeping     Transfers: PT Bed Mobility, Bed to Chair, Car, [employer], Futures trader, Metallurgist: PT Ambulation, Emergency planning/management officer, Stairs     Additional Impairments: OT Fuctional Use of Upper Extremity  SLP None      TR      Anticipated Outcomes Item Anticipated Outcome  Self Feeding Mod I   Swallowing      Basic self-care  Mod I   Toileting  Mod I    Bathroom Transfers Mod I   Bowel/Bladder  maintain regular habit of emptying bowel. Maintain continence of Bowel and bladder  Transfers  mod I  Locomotion  Mod I  Communication     Cognition     Pain  less than 2  Safety/Judgment  Remain free of falls, skin breakdown, infection   Therapy Plan: PT Intensity: Minimum of 1-2 x/day ,45 to 90 minutes PT Frequency: 5 out of 7 days PT Duration Estimated Length of Stay: 7-9 days OT Intensity: Minimum of 1-2 x/day, 45 to 90 minutes OT Frequency: 5 out of 7 days OT Duration/Estimated Length of Stay: 7-10 days SLP Duration/Estimated Length of Stay: N/A    Team Interventions: Nursing Interventions Patient/Family Education, Bowel Management, Medication Management, Discharge Planning, Psychosocial Support  PT interventions Ambulation/gait training, Disease management/prevention, Pain management, Stair training, Visual/perceptual remediation/compensation, Human resources officer, DME/adaptive equipment instruction, Patient/family education, Therapeutic Activities, Wheelchair propulsion/positioning, Cognitive remediation/compensation, Functional electrical stimulation, Psychosocial support, Therapeutic Exercise, Community reintegration, Functional mobility training, Skin care/wound management, UE/LE Strength taining/ROM, Discharge planning, Neuromuscular re-education, Splinting/orthotics, UE/LE Coordination activities  OT Interventions Training and development officer, Community reintegration, Disease mangement/prevention, Neuromuscular re-education, Patient/family education, Self Care/advanced ADL retraining, Therapeutic Exercise, UE/LE Coordination activities, Wheelchair propulsion/positioning, Visual/perceptual remediation/compensation, UE/LE Strength taining/ROM, Therapeutic Activities, Psychosocial support, Pain management, Functional mobility training, DME/adaptive equipment instruction, Discharge planning  SLP Interventions    TR Interventions    SW/CM Interventions Discharge Planning, Psychosocial Support, Patient/Family Education   Barriers to Discharge MD  Medical stability  Nursing      PT      OT Medical stability    SLP      SW       Team Discharge Planning: Destination: PT-Home ,OT- Home , SLP-Home Projected Follow-up: PT-Outpatient PT, OT-  Home health OT, Outpatient OT(either), SLP-None Projected Equipment Needs: PT-To be determined, OT- To be determined, SLP-None recommended by SLP Equipment Details: PT- , OT-  Patient/family involved in discharge planning: PT- Family member/caregiver, Patient,  OT-Patient, Family member/caregiver, SLP-Patient  MD ELOS: 7d Medical Rehab Prognosis:  Excellent Assessment:  38 year old right-handed female with history of ADHD maintained on Adderall and Xanax, panic attacks, exercise-induced asthma. Per chart reviewand patient,patient lives with spouse. Independent prior to admission. Two-level home 5 steps to  entry and bedroom on main level. She has 4 children ages 62 through 26. Family planning assistance as needed. Presented to2/11/2020with lethargy, dizziness and anxiety with bouts of nausea  as well as headache and neck pain. Patient could not recall any type of fall or neck hyperextension injury prior to admission. 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 Associated trace petechial hemorrhage in the occipital poles without frank hemorrhagic conversion. 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 Probable distal bilateral P3-P4 occlusion. Urine drug screen positive foramphetamines. MRI the brain showed multifocal areas of acute infarction, predominantly affecting the right cerebellum reflecting right vertebral dissection but also areas of acute infarction elsewhere in the posterior circulation notably both thalami, left temporal lobe. Noted significant mass effect on the outlet foramina the fourth ventricle without current features of hydrocephalus. [facility] CT reviewed, scan with slightly worsening compression of fourth ventricle, read pending. Patient completed a course of 3% normalsaline. Maintain on aspirin for CVA prophylaxis. Tolerating a regular diet. Therapy evaluations completed with recommendations of physical medicine rehabilitation consult. Patient was admitted for a comprehensive rehabilitation program.   Now requiring 24/7 Rehab RN,MD, as well as CIR level PT, OT and SLP.  Treatment team will focus on ADLs and mobility with goals set at Mod I See Team Conference Notes for weekly updates to the plan of care

## 2019-01-08 NOTE — Progress Notes (Signed)
Social Work  Social Work Assessment and Plan  Patient Details  Name: [name] MRN: [id number] Date of Birth: 12/26/1980  Today's Date: 01/08/2019  Problem List:  Patient Active Problem List   Diagnosis Date Noted  . Vertebral artery dissection ([place])   . Elevated blood pressure reading   . Major depressive disorder   . Panic disorder   . CVA (cerebral vascular accident) ([place]) 01/02/2019  . Acute ischemic stroke ([place]) 01/02/2019  . S/P cesarean section 05/05/2016  . S/P tubal ligation 05/05/2016  . Term pregnancy, repeat 05/04/2016  . Unspecified vitamin D deficiency 12/18/2012  . ADHD (attention deficit hyperactivity disorder) 11/26/2011  . Dysthymia 11/26/2011   Past Medical History:  Past Medical History:  Diagnosis Date  . ADD (attention deficit disorder)   . Exercise-induced asthma   . Hx of varicella   . Kidney stones   . S/P cesarean section 05/05/2016  . S/P tubal ligation 05/05/2016  . Term pregnancy, repeat 05/04/2016   Past Surgical History:  Past Surgical History:  Procedure Laterality Date  . BACK SURGERY     scoliosis age 64  . BILATERAL SALPINGECTOMY Bilateral 05/05/2016   Procedure: BILATERAL SALPINGECTOMY;  Surgeon: Janyth Contes, MD;  Location: [facility];  Service: Obstetrics;  Laterality: Bilateral;  . CESAREAN SECTION    . CESAREAN SECTION N/A 05/05/2016   Procedure: CESAREAN SECTION;  Surgeon: Janyth Contes, MD;  Location: [facility];  Service: Obstetrics;  Laterality: N/A;  . CHOLECYSTECTOMY    . HAND SURGERY    . HERNIA REPAIR    . KIDNEY STONE SURGERY     R--surgery; L lithotripsy.  Dr. Gaynelle Arabian   Social History:  reports that she has never smoked. She has never used smokeless tobacco. She reports that she does not drink alcohol or use drugs.  Family / Support Systems Marital Status: Married Patient Roles: Spouse, Parent, Other (Comment)(employee) Spouse/Significant Other: Wes [phone] Children: Four  children range in age from 12 yo-78 yo Other Supports: Pt's father and grandparents Anticipated Caregiver: Husband and other family members Ability/Limitations of Caregiver: Husband works for father in-law and can be flexible Caregiver Availability: 24/7(Between them they can provide 24 hr care) Family Dynamics: Close knit family pt and her family along with husband and his family. They will provide whatever pt needs and will work together. They are assisting with the children's care now. Pt lost her Mom 18 months ago to cancer.  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   Abuse/Neglect Abuse/Neglect Assessment Can Be Completed: Yes Physical Abuse: Denies Verbal Abuse: Denies Sexual Abuse: Denies Exploitation of patient/patient's resources: Denies Self-Neglect: Denies  Emotional Status Pt's affect, behavior and adjustment status: Pt is able to explain her strokes and dissection she is just now coming to realize what happened. She is motivated to recover and regain her independence, she has kids to take care of. Her husband is very supportive and positive for pt Recent Psychosocial Issues: other health issues on medications for ADHD and her panic attacks. Raising four children. Psychiatric History: Panic attack history-on medications for this and finds  it helpful. Do feel with her young age and multiple stressors she would benefit from seeing neuro-psych while here. Will make referral for this to happen this week.  Substance Abuse History:  No issues  Patient / Family Perceptions, Expectations & Goals Pt/Family understanding of illness & functional limitations: Pt and husband can explain her strokes and deficits, she has made progress since it happened and both are encouraged by this. They do talk with the MD's and feel they have a good understanding and know as much as the MD's know at this point. Pt reprots they are still looking for the reason she dissected. Premorbid pt/family roles/activities: Mom, wife, daughter, niece, granddaughter, friend, employee, church member, etc Anticipated changes in roles/activities/participation: resume Pt/family expectations/goals: Pt states: " I want to be as independent as I can be before going home, I know my kids need me."  Husband states: " I can see she is doing so much better and hope this continues she is willing to do what she needs to do to do well here."  [employer]: None Premorbid Home Care/DME Agencies: None Transportation available at discharge: [employer] referrals recommended: Neuropsychology, Support group (specify)  Discharge Planning Living Arrangements: Spouse/significant other, Children Support Systems: Spouse/significant other, Children, Parent, Other relatives, Friends/neighbors, Social worker community Type of Residence: Private residence Insurance Resources: [employer] (specify county), Multimedia programmer (specify)(UHC) Museum/gallery curator Resources: Employment, Secondary school teacher Screen Referred: No Living Expenses: Medical laboratory scientific officer Management: Patient, Spouse Does the patient have any problems obtaining your medications?: No Home Management: Self now others will assist with this until she is able to do again Patient/Family Preliminary Plans: Return home with husband and children and other family members if needed. They have good family support and will wait and see what is actually needed at discharge. Aware team conference Wed and will work  on any discharge needs. Do feel he would benefit from neuro-psych will make referral.  Social Work Anticipated Follow Up Needs: HH/OP, Support Group  Clinical Impression Positive upbeat female who is ready to work hard and regain her independence here. Her husband is here and very supportive and positive. They have good family support who will step up and assist with the kids until pt can resume her role. Have made neuro-psych referral due to feel will assist with her coping and she is young. Will work on discharge needs.  Elease Hashimoto 01/08/2019, 1:10 PM

## 2019-01-09 ENCOUNTER — Inpatient Hospital Stay (HOSPITAL_COMMUNITY): Payer: 59 | Admitting: Speech Pathology

## 2019-01-09 ENCOUNTER — Encounter (HOSPITAL_COMMUNITY): Payer: 59 | Admitting: Psychology

## 2019-01-09 ENCOUNTER — Inpatient Hospital Stay (HOSPITAL_COMMUNITY): Payer: Self-pay | Admitting: Physical Therapy

## 2019-01-09 ENCOUNTER — Inpatient Hospital Stay (HOSPITAL_COMMUNITY): Payer: 59

## 2019-01-09 ENCOUNTER — Inpatient Hospital Stay (HOSPITAL_COMMUNITY): Payer: Self-pay

## 2019-01-09 NOTE — Progress Notes (Signed)
Physical Therapy Session Note  Patient Details  Name: [name] MRN: [id number] Date of Birth: 04/14/1981  Today's Date: 01/09/2019 PT Individual Time: 1000-1055 PT Individual Time Calculation (min): 55 min   Short Term Goals: Week 1:  PT Short Term Goal 1 (Week 1): STG=LTG due to ELOS  Skilled Therapeutic Interventions/Progress Updates:   Pt in supine and agreeable to therapy, no c/o pain. Supervision bed mobility and ambulated to therapy gym w/ CGA to close supervision using RW. Session focused on standing balance and balance strategies. Sit<>stands w/o RW and stood to tap visual targets w/ BLEs, emphasis on lateral weight shifting and ankle strategies. Blocked practice of sit<>stands on foam surface and tossed horseshoes while standing on foam surface. CGA to min assist for all standing balance tasks. Pt picked up horseshoes from floor w/ min assist w/o UE support. Dynavision while standing on firm and foam surface, 2 min each time using BUEs. Ambulated around unit w/o AD to work on stepping balance strategies and postural control. ~100' x3, mod assist overall and max assist to correct LOB x2. Verbal cues for gait pattern and manual facilitation of lateral weight shifting to prevent LOB to right side. Frequent, brief seated rest breaks 2/2 fatigue. Returned to room and ended session in supine, all needs in reach.   Therapy Documentation Precautions:  Precautions Precautions: Fall Restrictions Weight Bearing Restrictions: No Vital Signs: Therapy Vitals Temp: 97.8 F (36.6 C) Pulse Rate: 86 Resp: 18 BP: (!) 132/94 Patient Position (if appropriate): Standing Oxygen Therapy SpO2: 98 % O2 Device: Room Air  Therapy/Group: Individual Therapy  [name] 01/09/2019, 11:00 AM

## 2019-01-09 NOTE — Progress Notes (Signed)
[facility] PHYSICAL MEDICINE & REHABILITATION PROGRESS NOTE   Subjective/Complaints:  Discussed elevated BP, pt did not take meds at home, adderall on hold , pt had questions about Wellbutrin  ROS: Patient denies CP, SOB, N/V/D Objective:   No results found. Recent Labs    01/08/19 0728  WBC 10.6*  HGB 14.3  HCT 42.9  PLT 349   Recent Labs    01/08/19 0728  NA 140  K 3.7  CL 104  CO2 28  GLUCOSE 102*  BUN 7  CREATININE 0.80  CALCIUM 8.9    Intake/Output Summary (Last 24 hours) at 01/09/2019 0740 Last data filed at 01/08/2019 2000 Gross per 24 hour  Intake 480 ml  Output -  Net 480 ml     Physical Exam: Vital Signs Blood pressure (!) 135/106, pulse 83, temperature 98.7 F (37.1 C), temperature source Oral, resp. rate 16, height 5\' 4"  (1.626 m), weight 73.3 kg, last menstrual period 12/17/2018, SpO2 96 %. Constitutional: No distress . Vital signs reviewed. HEENT: EOMI, oral membranes moist Neck: supple Cardiovascular: RRR without murmur. No JVD    Respiratory: CTA Bilaterally without wheezes or rales. Normal effort    GI: BS +, non-tender, non-distended  Musculoskeletal:Normal range of motion.  General: No tenderness,deformityor edema.  Comments:  Neurological: She isalertand oriented to person, place, and time.   Good insight and awareness.  Normal language and speech.  Tracks to all visual fields with some delay in the left eye more than right.Diplopia on gaze to left   2-3 beats of nystagmus with lateral gaze to either side.  . Strength 4/5 RUE 5/5 LUE , RLE 4 to 4+/5--which is unchanged.  LLE 5/5. Decreased fmc RUE more than left with dysmetria and mild ataxia. Senses pain in all 4's.    Skin: Skin iswarmand dry.  Psychiatric: Pleasant and cooperative..     Assessment/Plan: 1. 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  Physiatrist is providing close team supervision and 24 hour management of active medical problems listed below.  Physiatrist and rehab team continue to assess barriers to discharge/monitor patient progress toward functional and medical goals  Care Tool:  Bathing    Body parts bathed by patient: Right arm, Left arm, Chest, Abdomen, Front perineal area, Buttocks, Right upper leg, Left upper leg, Right lower leg, Left lower leg, Face         Bathing assist Assist Level: Supervision/Verbal cueing     Upper Body Dressing/Undressing Upper body dressing   What is the patient wearing?: Pull over shirt    Upper body assist Assist Level: Supervision/Verbal cueing    Lower Body Dressing/Undressing Lower body dressing      What is the patient wearing?: Underwear/pull up, Pants     Lower body assist Assist for lower body dressing: Contact Guard/Touching assist     Toileting Toileting Toileting Activity did not occur (Clothing management and hygiene only): Refused  Toileting assist Assist for toileting: Contact Guard/Touching assist     Transfers Chair/bed transfer  Transfers assist     Chair/bed transfer assist level: Supervision/Verbal cueing     Locomotion Ambulation   Ambulation assist      Assist level: Supervision/Verbal cueing Assistive device: (no AD) Max distance: 150 ft   Walk 10 feet activity   Assist     Assist level: Supervision/Verbal cueing  Walk 50 feet activity   Assist    Assist level: Minimal Assistance - Patient > 75%      Walk 150 feet activity   Assist    Assist level: Moderate Assistance - Patient - 50 - 74%      Walk 10 feet on uneven surface  activity   Assist     Assist level: Moderate Assistance - Patient - 50 - 74% Assistive device: Aeronautical engineer Will patient use wheelchair at discharge?: No              Wheelchair 50 feet with 2 turns activity    Assist            Wheelchair 150 feet activity     Assist          Medical Problem List and Plan: 1.Decreased functional mobility with ataxic gait and associated dizziness, nausea, headachesecondary to 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 DVT Prophylaxis/Anticoagulation: SCDs. Monitor for any signs of DVT 3. Pain Management:Tylenol as needed Consider adjustments to medications as necessary.  Patient is satisfied with control at present and does not want to add further medication at this time. 4. Mood/ADD/panic attacks:Zoloft 100 mg daily at bedtime -team and family to provide ego support  -trazodone prn for sleep 5. Neuropsych: This patientiscapable of making decisions on herown behalf.Hx anxiety but no signs of worsening , Adderall on hold for HTN 6. Skin/Wound Care:Routine skin checks 7. Fluids/Electrolytes/Nutrition:Routine in and out's with follow-up chemistries -encourage PO intake 8. Elevated blood pressure: Monitor with increased mobility, check ortho BPs    9. Constipation: Continue to augment bowel regimen -Had only small hard bowel movement with sorbitol and Dulcolax suppository this morning.  -We will try soapsuds enema multiple BMs  10. LMB:EMLJ improved   Vitals:   01/08/19 1932 01/09/19 0405  BP: (!) 146/107 (!) 135/106  Pulse: 79 83  Resp: 18 16  Temp: (!) 97.3 F (36.3 C) 98.7 F (37.1 C)  SpO2: 44% 92%  Has diastolic elevation- was not on antihypertensive at home  LOS: 3 days A FACE TO FACE EVALUATION WAS PERFORMED  Charlett Blake 01/09/2019, 7:40 AM

## 2019-01-09 NOTE — Plan of Care (Signed)
  Problem: RH BOWEL ELIMINATION Goal: RH STG MANAGE BOWEL WITH ASSISTANCE Description STG Manage Bowel with Assistance. Mod I  Outcome: Progressing Goal: RH STG MANAGE BOWEL W/MEDICATION W/ASSISTANCE Description STG Manage Bowel with Medication with Assistance. Mod I  Outcome: Progressing   Problem: RH SAFETY Goal: RH STG ADHERE TO SAFETY PRECAUTIONS W/ASSISTANCE/DEVICE Description STG Adhere to Safety Precautions With Assistance/Device. Mod I  Outcome: Progressing   Problem: Consults Goal: RH STROKE PATIENT EDUCATION Description See Patient Education module for education specifics  Outcome: Progressing

## 2019-01-09 NOTE — Progress Notes (Signed)
Occupational Therapy Session Note  Patient Details  Name: [name] MRN: [id number] Date of Birth: 04-Nov-1981  Today's Date: 01/09/2019 OT Individual Time: 0815-0930 OT Individual Time Calculation (min): 75 min    Short Term Goals: Week 1:  OT Short Term Goal 1 (Week 1): STGs=LTGs due to ELOS  Skilled Therapeutic Interventions/Progress Updates:    OT intervention with focus on BADL retraining, family education with husband, functional amb with RW, simple home mgmt tasks, activity tolerance, and safety awareness to increase independence with BADLs. Pt's husband provided supervision for bathing/dressing tasks.  Pt completed all tasks without assistance.  Pt stood at sink to brush hair and use blow dryer. Pt amb with RW to ADL apartment and removed and replaced comforter on bed without use of RW.  Pt required CGA during task.  Pt also practiced simulated shower transfers and discussed options. Pt continues with diplopia but improved. Pt requires mod verbal cues for safety awareness.  Pt returned to room and remained in bed with all needs within reach, bed alarm activated, and husband present.   Therapy Documentation Precautions:  Precautions Precautions: Fall Restrictions Weight Bearing Restrictions: No Pain: Pain Assessment Pain Scale: Faces Faces Pain Scale: Hurts little more Pain Type: Acute pain Pain Location: Head Pain Orientation: Lateral;Left Pain Descriptors / Indicators: Aching;Headache Pain Onset: On-going Pain Intervention(s): RN Mekeidas notified     Therapy/Group: Individual Therapy  Leroy Libman 01/09/2019, 10:30 AM

## 2019-01-09 NOTE — Progress Notes (Signed)
Physical Therapy Session Note  Patient Details  Name: [name] MRN: [id number] Date of Birth: [birth year]  Today's Date: 01/09/2019 PT Individual Time: 1515-1630 PT Individual Time Calculation (min): 75 min   Short Term Goals: Week 1:  PT Short Term Goal 1 (Week 1): STG=LTG due to ELOS  Skilled Therapeutic Interventions/Progress Updates:    Pt supine in bed upon PT arrival, agreeable to therapy tx and denies pain. Pt transferred to sitting with supervision and donned shoes with supervision. Pt ambulated into bathroom with RW and CGA, performed toileting with supervision and ambulated to sink to wash hands. Pt ambulated to the gym x 200 ft with RW and CGA. Pt transferred to quadruped this session working on core strengthening and postural control this session to performed x 10 hip extensions each side, x 10 arm raises each side, and x 10 bird/dog. Pt worked on postural control and balance in tall kneeling to perform reaching activity with R UE and then to perform cross body chops with 2kg ball 2 x 10 each side. Pt ambulated x 80 ft without AD and min assist to max assist for R lateral LOB, verbal cues for step placement. Pt worked on postural control and balance while walking on knees in tall kneeling min assist. Pt worked on balance reaction training with R lateral sidesteps and ambulation forwards/backwards while in the maxi sky, mirror for visual feedback. Pt ambulated back to room at end of session with RW and CGA x 150 ft and left in bed with needs in reach.   Therapy Documentation Precautions:  Precautions Precautions: Fall Restrictions Weight Bearing Restrictions: No   Therapy/Group: Individual Therapy  Netta Corrigan, PT, DPT 01/09/2019, 8:02 AM

## 2019-01-10 ENCOUNTER — Inpatient Hospital Stay (HOSPITAL_COMMUNITY): Payer: 59 | Admitting: *Deleted

## 2019-01-10 ENCOUNTER — Inpatient Hospital Stay (HOSPITAL_COMMUNITY): Payer: Self-pay

## 2019-01-10 ENCOUNTER — Inpatient Hospital Stay (HOSPITAL_COMMUNITY): Payer: 59

## 2019-01-10 NOTE — Progress Notes (Signed)
[facility] PHYSICAL MEDICINE & REHABILITATION PROGRESS NOTE   Subjective/Complaints:  No issues overnite, good BM yesterday  ROS: Patient denies CP, SOB, N/V/D Objective:   No results found. Recent Labs    01/08/19 0728  WBC 10.6*  HGB 14.3  HCT 42.9  PLT 349   Recent Labs    01/08/19 0728  NA 140  K 3.7  CL 104  CO2 28  GLUCOSE 102*  BUN 7  CREATININE 0.80  CALCIUM 8.9    Intake/Output Summary (Last 24 hours) at 01/10/2019 0803 Last data filed at 01/09/2019 1730 Gross per 24 hour  Intake 600 ml  Output -  Net 600 ml     Physical Exam: Vital Signs Blood pressure (!) 145/92, pulse 85, temperature 98.4 F (36.9 C), temperature source Oral, resp. rate 14, height 5\' 4"  (1.626 m), weight 74.4 kg, last menstrual period 12/17/2018, SpO2 94 %. Constitutional: No distress . Vital signs reviewed. HEENT: EOMI, oral membranes moist Neck: supple Cardiovascular: RRR without murmur. No JVD    Respiratory: CTA Bilaterally without wheezes or rales. Normal effort    GI: BS +, non-tender, non-distended  Musculoskeletal:Normal range of motion.  General: No tenderness,deformityor edema.  Comments:  Neurological: She isalertand oriented to person, place, and time.   Good insight and awareness.  Normal language and speech.  Tracks to all visual fields with some delay in the left eye more than right.Diplopia on gaze to left   2-3 beats of nystagmus with lateral gaze to either side.  . Strength 4/5 RUE 5/5 LUE , RLE 4 to 4+/5--which is unchanged.  LLE 5/5. Decreased fmc RUE more than left with dysmetria and mild ataxia. Senses pain in all 4's.    Skin: Skin iswarmand dry.  Psychiatric: Pleasant and cooperative..     Assessment/Plan: 1. Functional deficits secondary to ataxia and functional deficits related to right cerebellar and bilateral PCA infarctions which require 3+ hours per day of interdisciplinary therapy in a comprehensive inpatient rehab  setting.  Physiatrist is providing close team supervision and 24 hour management of active medical problems listed below.  Physiatrist and rehab team continue to assess barriers to discharge/monitor patient progress toward functional and medical goals  Care Tool:  Bathing    Body parts bathed by patient: Right arm, Left arm, Chest, Abdomen, Front perineal area, Buttocks, Right upper leg, Left upper leg, Right lower leg, Left lower leg, Face         Bathing assist Assist Level: Supervision/Verbal cueing     Upper Body Dressing/Undressing Upper body dressing   What is the patient wearing?: Pull over shirt, Bra    Upper body assist Assist Level: Supervision/Verbal cueing    Lower Body Dressing/Undressing Lower body dressing      What is the patient wearing?: Underwear/pull up, Pants     Lower body assist Assist for lower body dressing: Supervision/Verbal cueing     Toileting Toileting Toileting Activity did not occur (Clothing management and hygiene only): Refused  Toileting assist Assist for toileting: Contact Guard/Touching assist     Transfers Chair/bed transfer  Transfers assist     Chair/bed transfer assist level: Contact Guard/Touching assist     Locomotion Ambulation   Ambulation assist      Assist level: Moderate Assistance - Patient 50 - 74% Assistive device: Walker-rolling Max distance: 80 ft   Walk 10 feet activity   Assist     Assist level: Moderate Assistance - Patient - 50 - 74% Assistive device: Walker-rolling  Walk 50 feet activity   Assist    Assist level: Moderate Assistance - Patient - 50 - 74% Assistive device: Walker-rolling    Walk 150 feet activity   Assist    Assist level: Supervision/Verbal cueing Assistive device: Walker-rolling    Walk 10 feet on uneven surface  activity   Assist     Assist level: Moderate Assistance - Patient - 50 - 74% Assistive device: Horticulturist, commercial Will patient use wheelchair at discharge?: No             Wheelchair 50 feet with 2 turns activity    Assist            Wheelchair 150 feet activity     Assist          Medical Problem List and Plan: 1.Decreased functional mobility with ataxic gait and associated dizziness, nausea, headachesecondary to 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 DVT Prophylaxis/Anticoagulation: SCDs. Monitor for any signs of DVT 3. Pain Management:Tylenol as needed Consider adjustments to medications as necessary.  Patient is satisfied with control at present and does not want to add further medication at this time. 4. Mood/ADD/panic attacks:Zoloft 100 mg daily at bedtime -team and family to provide ego support  -trazodone prn for sleep 5. Neuropsych: This patientiscapable of making decisions on herown behalf.Hx anxiety but no signs of worsening , Adderall on hold for HTN 6. Skin/Wound Care:Routine skin checks 7. Fluids/Electrolytes/Nutrition:Routine in and out's with follow-up chemistries -encourage PO intake 8. Elevated blood pressure: Monitor with increased mobility, check ortho BPs    9. Constipation: Continue to augment bowel regimen -Had only small hard bowel movement with sorbitol and Dulcolax suppository this morning.  -We will try soapsuds enema multiple BMs  10. HTN: improved   Vitals:   01/10/19 0633 01/10/19 0636  BP: (!) 128/95 (!) 145/92  Pulse: 82 85  Resp:    Temp:    SpO2:    Has diastolic elevation- was not on antihypertensive at home, will monitor prior to starting med, consider BB if remains elevated  LOS: 4 days A FACE TO [place] E  01/10/2019, 8:03 AM

## 2019-01-10 NOTE — Progress Notes (Signed)
Social Work Patient ID: [name], female   DOB: Sep 07, 1981, 38 y.o.   MRN: [id number] Met with pt to discuss team conference goals supervision level and target discharge date 2/25. She is pleased with how well she is doing in her therapies. Husband is here daily and checked off to to transfers with pt. Discussed OP therapies with pt and she is agreeable to this. Work toward discharge next Tuesday.

## 2019-01-10 NOTE — Progress Notes (Signed)
Physical Therapy Session Note  Patient Details  Name: [name] MRN: [id number] Date of Birth: 02/02/1981  Today's Date: 01/10/2019 PT Individual Time: 1000-1100 and 1602-1700 PT Individual Time Calculation (min): 60 min and 58 min  Short Term Goals: Week 1:  PT Short Term Goal 1 (Week 1): STG=LTG due to ELOS  Skilled Therapeutic Interventions/Progress Updates:   Session 1:  Pt supine in bed upon PT arrival, agreeable to therapy tx and denies pain. Pt transferred to sitting EOB with supervision. Pt ambulated to the gym x 150 ft with RW and CGA, x 100 ft without AD and min-mod assist for R lateral lean/LOB. Pt participated in BWSTT this session working on gait training, balance, postural control and R foot placement. Pt ambulated on treadmill with BWSTT and mirror for visual feedback no UE support: forwards ambulation 7 min 0.4 mph x229 ft, 5:37 min, 0.4-0.6 mph x123ft and one trial without shoes for increased sensory input through the feet 3 min at 0.4 mph x119 ft. Pt ambulated back to room without AD and min assist overall, increased R foot placement and postural control overall. Pt left supine in bed with needs in reach and husband present.   Session 2: Pt supine in bed upon PT arrival, agreeable to therapy tx and denies pain. Pt transferred to sitting EOB with supervision. Pt ambulated to the gym x250 ft without AD and min assist for R lateral lean/LOB. Pt participated in BWSTT this session working on gait training, balance, postural control and R foot placement. Pt ambulated on treadmill with BWSTT and mirror for visual feedback no UE support: forwards ambulation 8.86min 0.4 mph-0.7 mph x433ft, sidestepping to the R x 4 min 0.3-0.4 mph x110 ft, sidestepping to the R x 4 min 0.4 mph x ft. Within litegait also worked on lateral stepping strategies to the R with therapist applying manual perturbations at random. Pt ambulated back to room x 250 ft with min assist and occasional cues for foot  placement/balance. Pt left supine in bed with needs in reach.   Therapy Documentation Precautions:  Precautions Precautions: Fall Restrictions Weight Bearing Restrictions: No    Therapy/Group: Individual Therapy  Netta Corrigan, PT, DPT 01/10/2019, 7:54 AM

## 2019-01-10 NOTE — Evaluation (Signed)
Recreational Therapy Assessment and Plan  Patient Details  Name: [name] MRN: [id number] Date of Birth: October 02, 1981 Today's Date: 01/10/2019  Rehab Potential: Good ELOS: discharge 2/25   Assessment Problem List:      Patient Active Problem List   Diagnosis Date Noted  . Vertebral artery dissection ([place])   . Elevated blood pressure reading   . Major depressive disorder   . Panic disorder   . CVA (cerebral vascular accident) ([place]) 01/02/2019  . Acute ischemic stroke ([place]) 01/02/2019  . S/P cesarean section 05/05/2016  . S/P tubal ligation 05/05/2016  . Term pregnancy, repeat 05/04/2016  . Unspecified vitamin D deficiency 12/18/2012  . ADHD (attention deficit hyperactivity disorder) 11/26/2011  . Dysthymia 11/26/2011    Past Medical History:      Past Medical History:  Diagnosis Date  . ADD (attention deficit disorder)   . Exercise-induced asthma   . Hx of varicella   . Kidney stones   . S/P cesarean section 05/05/2016  . S/P tubal ligation 05/05/2016  . Term pregnancy, repeat 05/04/2016   Past Surgical History:       Past Surgical History:  Procedure Laterality Date  . BACK SURGERY     scoliosis age 38  . BILATERAL SALPINGECTOMY Bilateral 05/05/2016   Procedure: BILATERAL SALPINGECTOMY;  Surgeon: Janyth Contes, MD;  Location: [facility];  Service: Obstetrics;  Laterality: Bilateral;  . CESAREAN SECTION    . CESAREAN SECTION N/A 05/05/2016   Procedure: CESAREAN SECTION;  Surgeon: Janyth Contes, MD;  Location: [facility];  Service: Obstetrics;  Laterality: N/A;  . CHOLECYSTECTOMY    . HAND SURGERY    . HERNIA REPAIR    . KIDNEY STONE SURGERY     R--surgery; L lithotripsy.  Dr. Gaynelle Arabian    Assessment & Plan Clinical Impression: Patient is a 38 y.o. year old female with history of ADHD maintained on Adderall and Xanax, panic attacks, exercise-induced asthma. Per chart reviewand patient,patient lives  with spouse. Independent prior to admission. Two-level home 5 steps to entry and bedroom on main level. She has 4 children ages 87 through 17. Family planning assistance as needed. Presented to2/11/2020with lethargy, dizziness and anxiety with bouts of nausea as well as headache and neck pain. Patient could not recall any type of fall or neck hyperextension injury prior to admission. 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 Associated trace petechial hemorrhage in the occipital poles without frank hemorrhagic conversion. 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 Probable distal bilateral P3-P4 occlusion. Urine drug screen positive foramphetamines. MRI the brain showed multifocal areas of acute infarction, predominantly affecting the right cerebellum reflecting right vertebral dissection but also areas of acute infarction elsewhere in the posterior circulation notably both thalami, left temporal lobe. Noted significant mass effect on the outlet foramina the fourth ventricle without current features of hydrocephalus. [facility] CT reviewed, scan with slightly worsening compression of fourth ventricle, read pending. Patient completed a course of 3% normalsaline. Maintain on aspirin for CVA prophylaxis. Tolerating a regular diet. Therapy evaluations completed with recommendations of physical medicine rehabilitation consult. Patient was admitted for a comprehensive rehabilitation program.Patient was planned to be admitted today, however, due to increase in fluid on CT, will hold per Neurology and monitor overnight.Patient transferred  to CIR on 01/06/2019 .   Pt presents decreased coordination, diplopia, decreased activity  tolerance, decreased functional mobility, decreased balance Limiting pt's independence with leisure/community pursuits.  Leisure History/Participation Premorbid leisure interest/current participation: [employer] Leisure Participation Style: With Family/Friends Awareness of Community Resources: Good-identify 3 post discharge leisure resources Psychosocial / Spiritual Spiritual Interests: Church;Womens'Men's Groups Social interaction - Mood/Behavior: Cooperative Engineer, drilling for Education?: Yes Patient Agreeable to Outing?: Yes Recreational Therapy Orientation Orientation -Reviewed with patient: Available activity resources Strengths/Weaknesses Patient Strengths/Abilities: Willingness to participate;Active premorbidly Patient weaknesses: Physical limitations TR Patient demonstrates impairments in the following area(s): Endurance;Motor;Safety  Plan Rec Therapy Plan Is patient appropriate for Therapeutic Recreation?: Yes Rehab Potential: Good Treatment times per week: Min 1 TR session >30 minutes for community reintegration during LOS Estimated Length of Stay: discharge 2/25 TR Treatment/Interventions: Adaptive equipment instruction;Community reintegration;Patient/family education;Functional mobility training Recommendations for other services: Neuropsych  Recommendations for other services: Neuropsych  Discharge Criteria: Patient will be discharged from TR if patient refuses treatment 3 consecutive times without medical reason.  If treatment goals not met, if there is a change in medical status, if patient makes no progress towards goals or if patient is discharged from hospital.  The above assessment, treatment plan, treatment alternatives and goals were discussed and mutually agreed upon: by patient   Pt referred by team for community reintegration.  Discussed the purpose of an outing and potential goals with pt and  husband.  Pt agreeable to participate in an outing to be scheduled tomorrow weather permitting.  [place] 01/10/2019, 4:13 PM

## 2019-01-10 NOTE — Patient Care Conference (Signed)
Inpatient RehabilitationTeam Conference and Plan of Care Update Date: 01/10/2019   Time: 11:00 AM    Patient Name: [name]      Medical Record Number: [id number]  Date of Birth: 1981/05/24 Sex: Female         Room/Bed: 4W20C/4W20C-01 Payor Info: Payor: Theme park manager / Plan: [place] / Product Type: *No Product type* /    Admitting Diagnosis: CVA  Admit Date/Time:  01/06/2019  3:28 PM Admission Comments: No comment available   Primary Diagnosis:  <principal problem not specified> Principal Problem: <principal problem not specified>  Patient Active Problem List   Diagnosis Date Noted  . Vertebral artery dissection ([place])   . Elevated blood pressure reading   . Major depressive disorder   . Panic disorder   . CVA (cerebral vascular accident) ([place]) 01/02/2019  . Acute ischemic stroke ([place]) 01/02/2019  . S/P cesarean section 05/05/2016  . S/P tubal ligation 05/05/2016  . Term pregnancy, repeat 05/04/2016  . Unspecified vitamin D deficiency 12/18/2012  . ADHD (attention deficit hyperactivity disorder) 11/26/2011  . Dysthymia 11/26/2011    Expected Discharge Date: Expected Discharge Date: 01/16/19  Team Members Present: Physician leading conference: Dr. [name] Penna Social Worker Present: Ovidio Kin, LCSW Nurse Present: Dorthula Nettles, RN PT Present: Michaelene Song, PT OT Present: Roanna Epley, COTA SLP Present: Weston Anna, SLP PPS Coordinator present : Gunnar Fusi     Current Status/Progress Goal Weekly Team Focus  Medical   Patient with diplopia, dizziness with standing, improving right-sided coordination  Reduce recurrent stroke risk, manage blood pressure  Discharge planning, blood pressure management   Bowel/Bladder   Continent of B&B  continue to be continent  assess bowel and blader needs qshift and PRN   Swallow/Nutrition/ Hydration             ADL's   supervision/CGA with AD overall; mod verbal cues for safety awareness;  continued diplopia  Independent with AD overall  family education, functional amb for home mgmt tasks, IADLs, activity tolerance,    Mobility   mod assist gait and min assist transfers w/o AD, Berg 32/56  modified independent  NMR, balance, coordination, endurance   Communication             Safety/Cognition/ Behavioral Observations            Pain   C/o of headache relieved with PRN tylenol  pain less than 3  assess pain management qshift and PRN   Skin   No skin impairments  remain free of skin impairments.  assess skin qshift and PRN      *See Care Plan and progress notes for long and short-term goals.     Barriers to Discharge  Current Status/Progress Possible Resolutions Date Resolved   Physician    Medical stability     Progressing well with therapy  Continue rehab      Nursing                  PT                    OT Medical stability                SLP                SW                Discharge Planning/Teaching Needs:  Home with husband and family to assist, here with their  children. Pt needs to go to OP will make referral.      Team Discussion:  Goals mod/i level currently supervision-CGA. Loses balance to the right and has no balance reaction. Distractible due to not on ADHD meds and part of CVA. Speech not following evaluated and did not pick up. Working on endurance issues. MD managing HTN and watching BP, may need medication upon DC.  Revisions to Treatment Plan:  DC 2/25    Continued Need for Acute Rehabilitation Level of Care: The patient requires daily medical management by a physician with specialized training in physical medicine and rehabilitation for the following conditions: Daily direction of a multidisciplinary physical rehabilitation program to ensure safe treatment while eliciting the highest outcome that is of practical value to the patient.: Yes Daily medical management of patient stability for increased activity during participation in an  intensive rehabilitation regime.: Yes Daily analysis of laboratory values and/or radiology reports with any subsequent need for medication adjustment of medical intervention for : Neurological problems   I attest that I was present, lead the team conference, and concur with the assessment and plan of the team.   Elease Hashimoto 01/10/2019, 12:10 PM

## 2019-01-10 NOTE — Progress Notes (Signed)
Occupational Therapy Session Note  Patient Details  Name: ADELEIGH BARLETTA MRN: [id number] Date of Birth: [birth year]  Today's Date: 01/10/2019 OT Individual Time: 2820-6015 OT Individual Time Calculation (min): 75 min    Short Term Goals: Week 1:  OT Short Term Goal 1 (Week 1): STGs=LTGs due to ELOS  Skilled Therapeutic Interventions/Progress Updates:    OT intervention with focus on BADL retraining/education, functional amb with RW, standing balance, RUE functional use, activity tolerance, and safety awareness to increase independence with BADLs. Pt's husband present and provided appropriate supervision with bathing/dressing tasks.  Pt stood at sink for brushing teeth and brushing hair with RUE.  Pt stated her R arm was getting fatigued and used her LUE to assist with task.  Pt completed tasks without assistance including putting hair in pony tail.  Pt amb with RW to gym and engaged in 9 hole peg test: R-45.99 secs L-26.06 and Box and Block test: R-31 blocks and L-42 blocks.  Pt returned to room and remained in bed with bed alarm activated and all needs within reach.  Husband present. Pt with increased control of movements during this session but continues to required max verbal cues for attention to task.   Therapy Documentation Precautions:  Precautions Precautions: Fall Restrictions Weight Bearing Restrictions: No Pain: Pain Assessment Pain Scale: 0-10 Pain Score: 0-No pain   Therapy/Group: Individual Therapy  Leroy Libman 01/10/2019, 10:24 AM

## 2019-01-11 ENCOUNTER — Inpatient Hospital Stay (HOSPITAL_COMMUNITY): Payer: 59 | Admitting: Physical Therapy

## 2019-01-11 ENCOUNTER — Inpatient Hospital Stay (HOSPITAL_COMMUNITY): Payer: 59

## 2019-01-11 NOTE — Progress Notes (Signed)
Recreational Therapy Session Note  Patient Details  Name: [name] MRN: [id number] Date of Birth: Sep 27, 1981 Today's Date: 01/11/2019 Time;  103-12 Pain: no c/o Skilled Therapeutic Interventions/Progress Updates: Pt participated in community reintegration/outing to [employer] at overall min assist ambulatory level using RW.  Goals focused on safe community mobility, identification & negotiation of obstacles, accessing public restroom, energy conservation techniques/education.  Pt's husband Wes attended the outing and was education on the above goals.  Pt met all goals. See outing goal sheet in shadow chart for full details.   Therapy/Group: [employer]  Erling Arrazola 01/11/2019, 9:27 AM

## 2019-01-11 NOTE — Progress Notes (Signed)
[place] [facility] PHYSICAL MEDICINE & REHABILITATION PROGRESS NOTE   Subjective/Complaints:  Patient ambulating with therapy in hallway, still feels unsteady but is able to release walker.  We discussed her blood pressure.  Diastolic still elevated  ROS: Patient denies CP, SOB, N/V/D Objective:   No results found. No results for input(s): WBC, HGB, HCT, PLT in the last 72 hours. No results for input(s): NA, K, CL, CO2, GLUCOSE, BUN, CREATININE, CALCIUM in the last 72 hours.  Intake/Output Summary (Last 24 hours) at 01/11/2019 0856 Last data filed at 01/10/2019 1804 Gross per 24 hour  Intake 720 ml  Output -  Net 720 ml     Physical Exam: Vital Signs Blood pressure (!) 139/103, pulse 83, temperature 98.2 F (36.8 C), temperature source Oral, resp. rate 19, height 5\' 4"  (1.626 m), weight 74.4 kg, last menstrual period 12/17/2018, SpO2 96 %. Constitutional: No distress . Vital signs reviewed. HEENT: EOMI, oral membranes moist Neck: supple Cardiovascular: RRR without murmur. No JVD    Respiratory: CTA Bilaterally without wheezes or rales. Normal effort    GI: BS +, non-tender, non-distended  Musculoskeletal:Normal range of motion.  General: No tenderness,deformityor edema.  Comments:  Neurological: She isalertand oriented to person, place, and time.   Good insight and awareness.  Normal language and speech.  Strength 4/5 RUE 5/5 LUE , RLE 4 to 4+/5--which is unchanged.  LLE 5/5. Decreased fmc RUE more than left with dysmetria and mild ataxia. Senses pain in all 4's.    Skin: Skin iswarmand dry.  Psychiatric: Pleasant and cooperative..     Assessment/Plan: 1. 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  Physiatrist is providing close team supervision and 24 hour management of active  medical problems listed below.  Physiatrist and rehab team continue to assess barriers to discharge/monitor patient progress toward functional and medical goals  Care Tool:  Bathing    Body parts bathed by patient: Right arm, Left arm, Chest, Abdomen, Front perineal area, Buttocks, Right upper leg, Left upper leg, Right lower leg, Left lower leg, Face         Bathing assist Assist Level: Supervision/Verbal cueing     Upper Body Dressing/Undressing Upper body dressing   What is the patient wearing?: Pull over shirt, Bra    Upper body assist Assist Level: Supervision/Verbal cueing    Lower Body Dressing/Undressing Lower body dressing      What is the patient wearing?: Underwear/pull up, Pants     Lower body assist Assist for lower body dressing: Supervision/Verbal cueing     Toileting Toileting Toileting Activity did not occur (Clothing management and hygiene only): Refused  Toileting assist Assist for toileting: Contact Guard/Touching assist     Transfers Chair/bed transfer  Transfers assist     Chair/bed transfer assist level: Contact Guard/Touching assist     Locomotion Ambulation   Ambulation assist      Assist level: Minimal Assistance - Patient > 75% Assistive device: Walker-rolling Max distance: 150 ft   Walk 10 feet activity   Assist     Assist level: Minimal Assistance - Patient > 75% Assistive device: Walker-rolling   Walk 50 feet activity   Assist    Assist level: Minimal Assistance - Patient > 75% Assistive device: Walker-rolling    Walk 150 feet activity   Assist    Assist level: Minimal Assistance - Patient >  75% Assistive device: Walker-rolling    Walk 10 feet on uneven surface  activity   Assist     Assist level: Moderate Assistance - Patient - 50 - 74% Assistive device: Aeronautical engineer Will patient use wheelchair at discharge?: No             Wheelchair 50 feet with 2  turns activity    Assist            Wheelchair 150 feet activity     Assist          Medical Problem List and Plan: 1.Decreased functional mobility with ataxic gait and associated dizziness, nausea, headachesecondary to 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 DVT Prophylaxis/Anticoagulation: SCDs. Monitor for any signs of DVT 3. Pain Management:Tylenol as needed Consider adjustments to medications as necessary.  Patient is satisfied with control at present and does not want to add further medication at this time. 4. Mood/ADD/panic attacks:Zoloft 100 mg daily at bedtime -team and family to provide ego support  -trazodone prn for sleep 5. Neuropsych: This patientiscapable of making decisions on herown behalf.Hx anxiety but no signs of worsening , Adderall on hold for HTN, per therapy is having problems with concentration 6. Skin/Wound Care:Routine skin checks 7. Fluids/Electrolytes/Nutrition:Routine in and out's with follow-up chemistries -encourage PO intake 8. Elevated blood pressure: Monitor with increased mobility, check ortho BPs    9. Constipation: Continue to augment bowel regimen -Had only small hard bowel movement with sorbitol and Dulcolax suppository this morning.  -We will try soapsuds enema multiple BMs  10. HTN: improved systolic elevated diastolic   Vitals:   52/84/13 1939 01/11/19 0521  BP: (!) 144/94 (!) 139/103  Pulse: 80 83  Resp: 19 19  Temp: 98 F (36.7 C) 98.2 F (36.8 C)  SpO2: 24% 40%  Has diastolic elevation- was not on antihypertensive at home, discussed with patient options are to either try an agent while she is in the hospital or to follow-up with primary care.  She will discuss this with me after conferring with her husband. LOS: 5 days A FACE TO FACE  EVALUATION WAS PERFORMED  Charlett Blake 01/11/2019, 8:56 AM

## 2019-01-11 NOTE — Progress Notes (Signed)
Occupational Therapy Session Note  Patient Details  Name: [name] MRN: [id number] Date of Birth: 03-23-1981  Today's Date: 01/11/2019 OT Individual Time: 0815-0930 OT Individual Time Calculation (min): 75 min    Short Term Goals: Week 1:  OT Short Term Goal 1 (Week 1): STGs=LTGs due to ELOS  Skilled Therapeutic Interventions/Progress Updates:    OT intervention with focus on ongoing family education for BADLs, functional amb with RW, standing balance on Wii balance boards, attention to task, and safety awareness to increase independence with BADLs.  Pt's husband provided appropriate supervision with bathing/dressing tasks.  Pt's husband cleared to assist pt with bathing/dressing tasks.  Safety plan updated and RN [facility] notified. Pt initiates use of RUE for all functional tasks.  Pt amb with RW to day room and engaged in balance tasks on balance board with CGA.  Pt with exaggerated balance reactions.  Pt returned to room and sat EOB with husband present.   Therapy Documentation Precautions:  Precautions Precautions: Fall Restrictions Weight Bearing Restrictions: No Pain: Pt denied pain this morning  Therapy/Group: Individual Therapy  Leroy Libman 01/11/2019, 10:42 AM

## 2019-01-11 NOTE — Progress Notes (Signed)
Recreational Therapy Discharge Summary Patient Details  Name: [name] MRN: [id number] Date of Birth: 08-07-1981 Today's Date: 01/11/2019  Long term goals set: 1  Long term goals met: 1  Comments on progress toward goals: Pt has made great progress during LOS and is discharging home with family on 2/21.  TR session focused on community reintegration and pt/family education.  Pt & husband participated in an outing today to [employer] with pt at overall Min assist ambulatory level using RW.  Goals focused on safety awareness during community mobility, identification and negotiation of obstacles, public restroom access, & energy conservation techniques.  Pt and husband, Gwynneth Macleod both stated understanding.  Goal met.  Reasons for discharge: discharge from hospital  Patient/family agrees with progress made and goals achieved: Yes  [name] 01/11/2019, 9:35 AM

## 2019-01-11 NOTE — Progress Notes (Signed)
Physical Therapy Session Note  Patient Details  Name: [name] MRN: [id number] Date of Birth: July 17, 1981  Today's Date: 01/11/2019 PT Individual Time: 1415-1515 PT Individual Time Calculation (min): 60 min   Short Term Goals: Week 1:  PT Short Term Goal 1 (Week 1): STG=LTG due to ELOS      Skilled Therapeutic Interventions/Progress Updates:  Pt awakening from a deep sleep.  Pt had word substitution/perseveration when trying to describe her glasses, using the words "load bearing" repeatedly.    Pt reported that she needed to use toilet.  Sit> stand with close supervision.  Gait in room with min assist no AD to toilet.  Supervision toilet transfers.  Pt performed 3/3 toileting tasks.    Bil heel cord stretching in standing with forefeet on rolled towel, x 2 minutes without UE support without LOB, with minimal sway A-P. Given external perturbations from behind her, pt demonstrated minimal bil ankle strategy, inadequate, and R stepping strategy. Given further practice, pt demonstrated increased bil ankle strategy.    Gait training with RW x 50' with close supervision.  Pt lacks push -off bill during terminal stance.    neuromuscular re-education for gait training without AD, x 75' x 2 with min assist; PT positioned in front of pt providing bil UE support.  Further neuromuscular re-education via demo and multimodal cues for 10 x 1 each bil heel raises/toe raises with 5 second holds, x 10.  Pt demonstrates powerful bil calf raises, and 1/2 range toe raises, but does not incorporate either into gait.  Pt left resting in w/c with needs at hand.  PT informed [name], NT that pt did not have seat belt alarm on; unable to find in room.  [name] will follow up.    Therapy Documentation Precautions:  Precautions Precautions: Fall Restrictions Weight Bearing Restrictions: No  Pain: none per pt     Therapy/Group: Individual Therapy  [name] 01/11/2019, 3:28 PM

## 2019-01-11 NOTE — Progress Notes (Signed)
Physical Therapy Session Note  Patient Details  Name: [name] MRN: [id number] Date of Birth: 1981/09/11  Today's Date: 01/11/2019 PT Individual Time: 1030-1155 PT Individual Time Calculation (min): 85 min   Short Term Goals: Week 1:  PT Short Term Goal 1 (Week 1): STG=LTG due to ELOS  Skilled Therapeutic Interventions/Progress Updates:   Pt participated in community reintegration/outing to [employer] at overall close supervision-CGA ambulatory level using RW, >1000'. Pt required close supervision-CGA for community mobility due to decreased safety awareness and awareness of her deficits, obstacle negotiation, and for reaching objects outside of BOS. Goals focused on safety awareness w/ community mobility, identification & negotiation of obstacles, accessing public restroom, and energy conservation techniques/education. Pt's husband present as well and actively participating in education and discussion. See outing goal sheet in shadow chart for full details.  Therapy Documentation Precautions:  Precautions Precautions: Fall Restrictions Weight Bearing Restrictions: No Pain: Pain Assessment Pain Scale: 0-10 Pain Score: 0-No pain  Therapy/Group: Individual Therapy  Shan Padgett Clent Demark 01/11/2019, 12:03 PM

## 2019-01-12 ENCOUNTER — Inpatient Hospital Stay (HOSPITAL_COMMUNITY): Payer: 59

## 2019-01-12 ENCOUNTER — Inpatient Hospital Stay (HOSPITAL_COMMUNITY): Payer: 59 | Admitting: Physical Therapy

## 2019-01-12 NOTE — Progress Notes (Signed)
[place] PHYSICAL MEDICINE & REHABILITATION PROGRESS NOTE   Subjective/Complaints:  Seen in OT, pt sup with ADL this am, occ HA, discussed BP  ROS: Patient denies CP, SOB, N/V/D Objective:   No results found. No results for input(s): WBC, HGB, HCT, PLT in the last 72 hours. No results for input(s): NA, K, CL, CO2, GLUCOSE, BUN, CREATININE, CALCIUM in the last 72 hours.  Intake/Output Summary (Last 24 hours) at 01/12/2019 0826 Last data filed at 01/11/2019 1700 Gross per 24 hour  Intake 240 ml  Output -  Net 240 ml     Physical Exam: Vital Signs Blood pressure (!) 133/99, pulse 94, temperature 98.5 F (36.9 C), temperature source Oral, resp. rate 18, height 5\' 4"  (1.626 m), weight 74.4 kg, last menstrual period 12/17/2018, SpO2 95 %. Constitutional: No distress . Vital signs reviewed. HEENT: EOMI, oral membranes moist Neck: supple Cardiovascular: RRR without murmur. No JVD    Respiratory: CTA Bilaterally without wheezes or rales. Normal effort    GI: BS +, non-tender, non-distended  Musculoskeletal:Normal range of motion.  General: No tenderness,deformityor edema.  Comments:  Neurological: She isalertand oriented to person, place, and time.   Good insight and awareness.  Normal language and speech.  Strength 4/5 RUE 5/5 LUE , RLE 4 to 4+/5--which is unchanged.  LLE 5/5. Decreased fmc RUE more than left with dysmetria and mild ataxia. Senses pain in all 4's.    Skin: Skin iswarmand dry.  Psychiatric: Pleasant and cooperative..     Assessment/Plan: 1. 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  Physiatrist is providing close team supervision and 24 hour management of active medical problems listed below.  Physiatrist and rehab team continue to assess barriers to  discharge/monitor patient progress toward functional and medical goals  Care Tool:  Bathing    Body parts bathed by patient: Right arm, Left arm, Chest, Abdomen, Front perineal area, Buttocks, Right upper leg, Left upper leg, Right lower leg, Left lower leg, Face         Bathing assist Assist Level: Supervision/Verbal cueing     Upper Body Dressing/Undressing Upper body dressing   What is the patient wearing?: Pull over shirt, Bra    Upper body assist Assist Level: Supervision/Verbal cueing    Lower Body Dressing/Undressing Lower body dressing      What is the patient wearing?: Underwear/pull up, Pants     Lower body assist Assist for lower body dressing: Supervision/Verbal cueing     Toileting Toileting Toileting Activity did not occur (Clothing management and hygiene only): Refused  Toileting assist Assist for toileting: Supervision/Verbal cueing     Transfers Chair/bed transfer  Transfers assist     Chair/bed transfer assist level: Supervision/Verbal cueing     Locomotion Ambulation   Ambulation assist      Assist level: Minimal Assistance - Patient > 75% Assistive device: Hand held assist Max distance: 75   Walk 10 feet activity   Assist     Assist level: Minimal Assistance - Patient > 75% Assistive device: Hand held assist   Walk 50 feet activity   Assist    Assist level: Minimal Assistance - Patient > 75% Assistive device: Hand held assist    Walk 150 feet activity   Assist    Assist level: Contact Guard/Touching assist Assistive device: Walker-rolling    Walk 10 feet on  uneven surface  activity   Assist     Assist level: Contact Guard/Touching assist Assistive device: Aeronautical engineer Will patient use wheelchair at discharge?: No             Wheelchair 50 feet with 2 turns activity    Assist            Wheelchair 150 feet activity     Assist          Medical  Problem List and Plan: 1.Decreased functional mobility with ataxic gait and associated dizziness, nausea, headachesecondary to 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 DVT Prophylaxis/Anticoagulation: SCDs. Monitor for any signs of DVT 3. Pain Management:Tylenol as needed Consider adjustments to medications as necessary.  Patient is satisfied with control at present and does not want to add further medication at this time. 4. Mood/ADD/panic attacks:Zoloft 100 mg daily at bedtime -team and family to provide ego support  -trazodone prn for sleep 5. Neuropsych: This patientiscapable of making decisions on herown behalf.Hx anxiety but no signs of worsening , Adderall on hold for HTN, per therapy is having problems with concentration 6. Skin/Wound Care:Routine skin checks 7. Fluids/Electrolytes/Nutrition:Routine in and out's with follow-up chemistries -encourage PO intake 8. Elevated blood pressure: Monitor with increased mobility, check ortho BPs    9. Constipation: Continue to augment bowel regimen -Had only small hard bowel movement with sorbitol and Dulcolax suppository this morning.  -We will try soapsuds enema multiple BMs  10. HTN: improved systolic elevated diastolic   Vitals:   95/09/32 0653 01/12/19 0656  BP: (!) 137/94 (!) 133/99  Pulse: 76 94  Resp:    Temp:    SpO2:    Has diastolic elevation- but now <100 was not on antihypertensive at home, discussed with patient options are to either try an agent while she is in the hospital or to follow-up with primary care.  She will discuss this with me after conferring with her husband. LOS: 6 days A FACE TO FACE EVALUATION WAS PERFORMED  [name] 01/12/2019, 8:26 AM

## 2019-01-12 NOTE — Progress Notes (Signed)
Occupational Therapy Session Note  Patient Details  Name: KENLEY RETTINGER MRN: [id number] Date of Birth: January 31, 1981  Today's Date: 01/12/2019 OT Individual Time: 7048-8891 OT Individual Time Calculation (min): 85 min    Short Term Goals: Week 1:  OT Short Term Goal 1 (Week 1): STGs=LTGs due to ELOS  Skilled Therapeutic Interventions/Progress Updates:    Pt completing shower with supervision from husband upon arrival.  Pt amb with RW into room and stood at sink to brush hair and brush teeth.  Pt using BUE functionally to complete task.  OT intervention with focus on RUE forced use, standing balance, sit<>stand, functional amb with RW, ongoing education, and safety awareness to increase independence with BADLs. Activities included heavy bag work with boxing gloves with focus on standing balance and forceful use of RUE, placing clothes pins on finger ladder to force RUE shoulder flexion to limits of AROM, handwriting practice, and bouncing ball against mini trampoline on compliant and noncompliant surfaces.  Pt challenged with side stepping to R to retrieve clothes pins before placing on finger ladder. Pt required CGA with all standing tasks (without RW). Pt amb back to room and sat EOB with husband present.   Therapy Documentation Precautions:  Precautions Precautions: Fall Restrictions Weight Bearing Restrictions: No Pain: Pain Assessment Pain Scale: 0-10 Pain Score: 0-No pain  Therapy/Group: Individual Therapy  Leroy Libman 01/12/2019, 10:02 AM

## 2019-01-12 NOTE — Progress Notes (Signed)
Occupational Therapy Session Note  Patient Details  Name: [name] MRN: [id number] Date of Birth: [birth year]  Today's Date: 01/12/2019 OT Individual Time: 1115-1200 OT Individual Time Calculation (min): 45 min    Short Term Goals: Week 1:  OT Short Term Goal 1 (Week 1): STGs=LTGs due to ELOS  Skilled Therapeutic Interventions/Progress Updates:    OT intervention with focus on functional amb with RW, dynamic standing balance, RUE forced use, activity tolerance, and safety awareness to increase independence with BADLs. Pt engaged in game of corn hole with focus on stand step and forced use of RUE with timing of release. Pt required CGA.  Pt able to squat to pick bean bags from floor with CGA. Pt transitioned to day room and used [employer] with focus on standing balance strategies.  Pt returned to room and bed.  Pt remained in bed with all needs within reach and bed alarm activated.   Therapy Documentation Precautions:  Precautions Precautions: Fall Restrictions Weight Bearing Restrictions: No  Pain: Pain Assessment Pain Score: 0-No pain   Therapy/Group: Individual Therapy  Leroy Libman 01/12/2019, 12:16 PM

## 2019-01-12 NOTE — Plan of Care (Signed)
  Problem: RH BOWEL ELIMINATION Goal: RH STG MANAGE BOWEL WITH ASSISTANCE Description STG Manage Bowel with Assistance. Mod I  Outcome: Progressing Goal: RH STG MANAGE BOWEL W/MEDICATION W/ASSISTANCE Description STG Manage Bowel with Medication with Assistance. Mod I  Outcome: Progressing   Problem: RH SAFETY Goal: RH STG ADHERE TO SAFETY PRECAUTIONS W/ASSISTANCE/DEVICE Description STG Adhere to Safety Precautions With Assistance/Device. Mod I  Outcome: Progressing   Problem: Consults Goal: RH STROKE PATIENT EDUCATION Description See Patient Education module for education specifics  Outcome: Progressing

## 2019-01-12 NOTE — Progress Notes (Signed)
Patient offered no complaints this evening. Sleeping medication given at hs and patient has been sleeping comfortably.

## 2019-01-12 NOTE — Progress Notes (Signed)
Physical Therapy Session Note  Patient Details  Name: [name] MRN: [id number] Date of Birth: [birth year]  Today's Date: 01/12/2019 PT Individual Time: 1300-1400 PT Individual Time Calculation (min): 60 min   Short Term Goals: Week 1:  PT Short Term Goal 1 (Week 1): STG=LTG due to ELOS  Skilled Therapeutic Interventions/Progress Updates:   Pt in supine and agreeable to therapy, no c/o pain. Ambulated to/from toilet w/ CGA, w/o AD and performed toilet transfer and LE garment management/pericare w/ supervision. Ambulated to/from therapy gym w/ CGA, w/o AD and w/o shoes on to promote increased proprioception in feet during gait. Pt self-corrected 2 LOBs towards R side. Worked on gait w/o AD and postural control in therapy gym. Negotiated cones, 30' x6, w/ CGA. Standing RUE reaching tasks to far R edge of BOS w/ feet together to work on balance strategies, close supervision to CGA for balance and verbal/visual cues for balance strategy technique. Sit<>stands on foam surface w/ emphasis on maintaining neutral LE alignment. Worked on R lateral weight shifting on foam w/ LLE step taps to 6" step, min assist fading to CGA for this task w/o UE support. Educated on performing supine bridge w/ orange theraband to work on pelvic stabilization musculature, pt performed 2 sets of 5 correctly and w/o pain. Added in isometric R hip abduction w/ orange theraband, 2 sets of 5. Returned to room and ended session in supine, all needs in reach.   Therapy Documentation Precautions:  Precautions Precautions: Fall Restrictions Weight Bearing Restrictions: No Vital Signs: Therapy Vitals Temp: 98.1 F (36.7 C) Pulse Rate: 90 Resp: 20 BP: (!) 145/96 Patient Position (if appropriate): Lying Oxygen Therapy SpO2: 95 %  Therapy/Group: Individual Therapy  [name] Clent Demark 01/12/2019, 4:43 PM

## 2019-01-13 NOTE — Progress Notes (Signed)
[name] is a 38 y.o. 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 ADD (attention deficit disorder)   . Exercise-induced asthma   . Hx of varicella   . Kidney stones   . S/P cesarean section 05/05/2016  . S/P tubal ligation 05/05/2016  . Term pregnancy, repeat 05/04/2016     Subjective: No new complaints. No new problems. Slept well.   Objective: Vital signs in last 24 hours: Temp:  [97.8 F (36.6 C)-98.3 F (36.8 C)] 98.3 F (36.8 C) (02/22 0354) Pulse Rate:  [63-90] 87 (02/22 0648) Resp:  [18-20] 18 (02/22 0354) BP: (124-145)/(82-102) 131/99 (02/22 0648) SpO2:  [95 %-97 %] 96 % (02/22 0648) Weight change:  Last BM Date: 01/11/19  Intake/Output from previous day: 02/21 0701 - 02/22 0700 In: 820 [P.O.:820] Out: -    Patient Vitals for the past 24 hrs:  BP Temp Temp src Pulse Resp SpO2  01/13/19 0648 (!) 131/99 - - 87 - 96 %  01/13/19 0646 (!) 141/93 - - 77 - 97 %  01/13/19 0645 (!) 144/98 - - 71 - 96 %  01/13/19 0354 124/82 98.3 F (36.8 C) Oral 63 18 97 %  01/12/19 2016 (!) 144/102 97.8 F (36.6 C) Oral 86 18 97 %  01/12/19 1421 (!) 145/96 98.1 F (36.7 C) - 90 20 95 %     Physical Exam General: No apparent distress mildly overweight HEENT: not dry Lungs: Normal effort. Lungs clear to auscultation, no crackles or wheezes. Cardiovascular: Regular rate and rhythm, no edema Abdomen: S/NT/ND; BS(+) Musculoskeletal:  unchanged Neurological: No new neurological deficits.  Normal speech alert and appropriate.  Mild right-sided weakness with dysmetria Wounds: N/A    Skin: clear no rash Mental state: Alert, oriented, cooperative    Lab Results: BMET    Component Value Date/Time   NA 140 01/08/2019 0728   K 3.7 01/08/2019 0728   CL 104 01/08/2019 0728   CO2 28  01/08/2019 0728   GLUCOSE 102 (H) 01/08/2019 0728   BUN 7 01/08/2019 0728   CREATININE 0.80 01/08/2019 0728   CREATININE 0.68 11/26/2011 1519   CALCIUM 8.9 01/08/2019 0728   GFRNONAA >60 01/08/2019 0728   GFRAA >60 01/08/2019 0728   CBC    Component Value Date/Time   WBC 10.6 (H) 01/08/2019 0728   RBC 4.95 01/08/2019 0728   HGB 14.3 01/08/2019 0728   HCT 42.9 01/08/2019 0728   PLT 349 01/08/2019 0728   MCV 86.7 01/08/2019 0728   MCH 28.9 01/08/2019 0728   MCHC 33.3 01/08/2019 0728   RDW 13.5 01/08/2019 0728   LYMPHSABS 3.5 01/08/2019 0728   MONOABS 0.8 01/08/2019 0728   EOSABS 0.3 01/08/2019 0728   BASOSABS 0.1 01/08/2019 0728     Medications: I have reviewed the patient's current medications.  Assessment/Plan:  Functional deficits secondary to right cerebellar and bilateral PCA infarctions.  Continue CIR DVT prophylaxis.  Continue SCDs Elevated blood pressure.  We will continue to monitor   Length of stay, days: 7  Marletta Lor , MD 01/13/2019, 11:13 AM

## 2019-01-14 ENCOUNTER — Inpatient Hospital Stay (HOSPITAL_COMMUNITY): Payer: 59

## 2019-01-14 NOTE — Progress Notes (Deleted)
Physical Therapy Session Note  Patient Details  Name: [name] MRN: [id number] Date of Birth: 1981/09/18  Today's Date: 01/14/2019 PT Individual Time: 1545-1630 PT Individual Time Calculation (min): 45 min   Short Term Goals: Week 1:  PT Short Term Goal 1 (Week 1): STG=LTG due to ELOS  Skilled Therapeutic Interventions/Progress Updates:    Pt supine in bed upon PT arrival, agreeable to therapy tx and denies pain. Pt transferred to sitting with supervision and ambulated into bathroom without AD and CGA, used bathroom and performed clothing management with supervision. Pt ambulated to the gym without AD x 150 ft with CGA-min assist. Session focused on higher level balance training with use of maxisky to decreased tactile input/feedback from therapist. Within maxisky pt performed forwards ambulation, backwards ambulation, 360 degree turns in both directions, sidestepping in both directions, stepping on/off objects including dynadisc, airex, bosu, 4 inch step and red wedge. Pt then completed obstacle course without use of maxisky to assess for carryover, CGA-min assist without AD pt ambulated across airex, 4 inch step and red wedge x 2 trials. Pt then ambulated while picking up objects off of the ground and carrying objects, min assist for balance. Pt ambulated back to room without AD, CGA x 150 ft. Pt left in bed with needs in reach and bed alarm set.   Therapy Documentation Precautions:  Precautions Precautions: Fall Restrictions Weight Bearing Restrictions: No    Therapy/Group: Individual Therapy  Netta Corrigan, PT, DPT 01/14/2019, 7:54 AM

## 2019-01-14 NOTE — Progress Notes (Signed)
[name] is a 38 y.o. 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 ADD (attention deficit disorder)   . Exercise-induced asthma   . Hx of varicella   . Kidney stones   . S/P cesarean section 05/05/2016  . S/P tubal ligation 05/05/2016  . Term pregnancy, repeat 05/04/2016     Subjective: No new complaints. No new problems. Slept well.   Objective: Vital signs in last 24 hours: Temp:  [97.4 F (36.3 C)-98.1 F (36.7 C)] 97.4 F (36.3 C) (02/23 0440) Pulse Rate:  [65-82] 65 (02/23 0440) Resp:  [18] 18 (02/23 0440) BP: (131-135)/(88-99) 135/93 (02/23 0440) SpO2:  [97 %-99 %] 97 % (02/23 0440) Weight change:  Last BM Date: 01/11/19  Intake/Output from previous day: 02/22 0701 - 02/23 0700 In: 460 [P.O.:460] Out: -   Patient Vitals for the past 24 hrs:  BP Temp Pulse Resp SpO2  01/14/19 0440 (!) 135/93 (!) 97.4 F (36.3 C) 65 18 97 %  01/13/19 1936 131/88 97.9 F (36.6 C) 82 18 98 %  01/13/19 1734 (!) 134/99 98.1 F (36.7 C) 81 18 99 %     Physical Exam General: No apparent distress   HEENT: Suggestion of mild right exotropia Lungs: Normal effort. Lungs clear to auscultation, no crackles or wheezes. Cardiovascular: Regular rate and rhythm, no edema Abdomen: S/NT/ND; BS(+) Musculoskeletal:  unchanged Neurological: No new neurological deficits; finger-to-nose testing normal on the left, slow and deliberate on the right with mild dysmetria Wounds: N/A    Skin: clear   Mental state: Alert, oriented, cooperative    Lab Results: BMET    Component Value Date/Time   NA 140 01/08/2019 0728   K 3.7 01/08/2019 0728   CL 104 01/08/2019 0728   CO2 28 01/08/2019 0728   GLUCOSE 102 (H) 01/08/2019 0728   BUN 7 01/08/2019 0728   CREATININE 0.80 01/08/2019 0728   CREATININE 0.68 11/26/2011 1519   CALCIUM  8.9 01/08/2019 0728   GFRNONAA >60 01/08/2019 0728   GFRAA >60 01/08/2019 0728   CBC    Component Value Date/Time   WBC 10.6 (H) 01/08/2019 0728   RBC 4.95 01/08/2019 0728   HGB 14.3 01/08/2019 0728   HCT 42.9 01/08/2019 0728   PLT 349 01/08/2019 0728   MCV 86.7 01/08/2019 0728   MCH 28.9 01/08/2019 0728   MCHC 33.3 01/08/2019 0728   RDW 13.5 01/08/2019 0728   LYMPHSABS 3.5 01/08/2019 0728   MONOABS 0.8 01/08/2019 0728   EOSABS 0.3 01/08/2019 0728   BASOSABS 0.1 01/08/2019 0728    Medications: I have reviewed the patient's current medications.  Assessment/Plan:  Functional deficits secondary to ataxia related to right cerebral and bilateral PCA infarctions.  Continue CIR.  Continue ASA DVT prophylaxis continue SCDs Hypertension.  Blood pressure remains minimally elevated.  We will continue to observe   Length of stay, days: 8  Marletta Lor , MD 01/14/2019, 10:41 AM

## 2019-01-14 NOTE — Progress Notes (Signed)
Per patient, she had a bowel movement tonight and does not need prn senokot.

## 2019-01-14 NOTE — Progress Notes (Signed)
Occupational Therapy Session Note  Patient Details  Name: [name] MRN: [id number] Date of Birth: Jun 03, 1981  Today's Date: 01/14/2019 OT Individual Time: 0915-1000 OT Individual Time Calculation (min): 45 min    Short Term Goals: Week 1:  OT Short Term Goal 1 (Week 1): STGs=LTGs due to ELOS  Skilled Therapeutic Interventions/Progress Updates:    OT intervention with focus on functional amb with/without AD, standing balance, RUE functional use, attention to task, activity tolerance, and safety awareness to increase independence with BADLs. Pt stood at sink without support/leaning  to brush teeth and hair with RUE. Pt amb to gym and engaged in game of [place] standing/sitting using RUE to complete all tasks.  Pt required to [employer] blocks before beginning game. Pt noted she was able to complete tasks with better success when not engaged in conversation. Pt amb without AD approx 50' back to room and then completed trip with RW.  Pt remained seated on EOB with firneds Sharyn Lull present.  Sharyn Lull ok to assist pt with transfers and shower.  Safety plan updated and staff notified.   Therapy Documentation Precautions:  Precautions Precautions: Fall Restrictions Weight Bearing Restrictions: No  Pain: Pt denies pain this morning  Therapy/Group: Individual Therapy  Leroy Libman 01/14/2019, 12:18 PM

## 2019-01-15 ENCOUNTER — Inpatient Hospital Stay (HOSPITAL_COMMUNITY): Payer: 59

## 2019-01-15 ENCOUNTER — Inpatient Hospital Stay (HOSPITAL_COMMUNITY): Payer: Self-pay

## 2019-01-15 NOTE — Discharge Summary (Signed)
NAME: [name], [name] MEDICAL RECORD [id number] ACCOUNT [id number] DATE OF BIRTH:07-11-1981 FACILITY: [facility] LOCATION: [facility] PHYSICIAN:ANDREW Letta Pate, MD  DISCHARGE SUMMARY  DATE OF DISCHARGE:  01/16/2019  DISCHARGE DIAGNOSES: 1.  Bilateral  infarctions from vertebral artery dissection. 2.  Sequential compression devices for deep venous thrombosis prophylaxis. 3.  Mood with history of panic attacks. 4.  Elevated blood pressures. 5.  Constipation.  HOSPITAL COURSE:  This is a 38 year old right-handed female with history of ADHD, panic attacks exercise Steroid-induced asthma who lives with spouse, independent prior to admission.  She has 4 children, ages 93 through 2.  Presented to 10/12/2019 with  lethargy, dizziness, anxiety, bouts of nausea, headache and neck pain.  The patient could not recall any type of fall or neck hyperextension injury prior to admission.  CT of the head as well as CT angiogram of head and neck showed evolving acute ischemic right cerebellar and bilateral PCA territory infarction with  involvement of the bilateral occipital lobes and thalami.  Associated trace petechial hemorrhage in the occipital poles without frank hemorrhagic conversion.  Acute right vertebral artery dissection involving the right V3 segment at the level of the  right C1 transverse foramen.  Probable distal bilateral P3-P4 occlusion.  Urine drug screen positive for amphetamines.  The patient had been on Adderall and Xanax prior to admission.  MRI of the brain showed multifocal areas of acute infarction,  predominantly affecting the right cerebellum reflecting right vertebral dissection, but also areas of acute infarction elsewhere in the posterior circulation, notably, both thalami, left temporal lobe.  Noted significant mass effect on the palate  foramina, the fourth ventricle without current features of hydrocephalus.  Neurosurgery consulted.  Conservative care.  Repeat scan stable.   Maintained on 3% normal saline, aspirin added for CVA prophylaxis.  Tolerating a regular diet.  Therapy  evaluations completed.  The patient was admitted for comprehensive rehabilitation program.  PAST MEDICAL HISTORY:  See discharge diagnoses.  SOCIAL HISTORY:  Lives with spouse, independent prior to admission.  FUNCTIONAL STATUS:  Upon admission to rehab services was moderate assist ambulate 250 feet 2 person handheld assist, minimal assist general transfers, min mod assist for ADLs.  PHYSICAL EXAMINATION: VITAL SIGNS:  Blood pressure 151/79, pulse 61, temperature 98, respirations 18. GENERAL:  Alert female, somewhat anxious.   HEENT:  EOMs intact.  Fair awareness of deficits. NECK:  Supple, nontender, no JVD. CARDIOVASCULAR:  Rate controlled. ABDOMEN:  Soft, nontender, good bowel sounds. LUNGS:  Clear to auscultation without wheeze.  REHABILITATION HOSPITAL COURSE:  The patient was admitted to inpatient rehabilitation services.  Therapies initiated on a 3-hour daily basis, consisting of physical therapy, occupational therapy, speech therapy and rehabilitation nursing.  The following  issues were addressed:    Pertaining to the patient's bilateral PCA infarctions from the right vertebral artery dissection, remained stable.  Aspirin therapy.  She would follow up with neurosurgery as advised.    Pain management with the use of Tylenol.  Mood stabilization.    Noted history of ADD, panic attacks are Zoloft had been resumed Xanax as needed.  She had been at her on Adderall prior to admission as this has been held due to some elevated blood pressures.  This could be resumed on an outpatient basis follow-up with PCP.    Blood pressure is overall controlled.  No antihypertensive medications.    Bouts of constipation, resolved with laxative assistance.    The patient received weekly collaborative interdisciplinary team conferences to discuss estimated length of  stay, family teaching, any  barriers to discharge.  Transfers sitting with supervision.  Ambulates to the bathroom without assistive device,  contact guard.  Ambulates to the gym without assistive device, 150 feet, contact guard minimal assist.  Gathers her belongings for activities of daily living and homemaking.  Improved safety and awareness.  She engaged in board games.  She was able to  complete tasks with better success as her therapies progressed when not engaged in conversation.  Full family teaching was completed and planned discharge to home.  DISCHARGE MEDICATIONS:  Included aspirin 325 mg p.o. daily, Zoloft 100 mg p.o. at bedtime, Xanax 0.25 mg p.o. b.i.d., Tylenol as needed.  Special instructions. Continue to hold Adderall until follow-up with PCP.  DIET:  Regular.  The patient would follow up with Dr. [name] Penna at the outpatient rehab center as directed.  Ambulatory referral obtained with neurology services.  Follow up with Dr. Earnie Larsson, call for appointment.  SPECIAL INSTRUCTIONS:  No driving.  AN/NUANCE M:76/72/0947 T:01/15/2019 JOB:[id number]

## 2019-01-15 NOTE — Progress Notes (Signed)
Social Work Patient ID: [name], female   DOB: 1981-01-21, 38 y.o.   MRN: [id number] Met with pt and husband who feel ready for discharge tomorrow. She has all needed equipment and is set up with OP follow up. Both pleased with her progress and feel ready to be discharged.

## 2019-01-15 NOTE — Discharge Summary (Signed)
Discharge summary job [id number]

## 2019-01-15 NOTE — Progress Notes (Signed)
Physical Therapy Discharge Summary  Patient Details  Name: [name] MRN: [id number] Date of Birth: 06/21/1981  Today's Date: 01/15/2019 PT Individual Time: 1100-1200 and 1500-1600 PT Individual Time Calculation (min): 60 min and 60 min    Patient has met 7 of 7 long term goals due to improved activity tolerance, improved balance, improved postural control, functional use of  right lower extremity and improved awareness.  Patient to discharge at an ambulatory level Modified Independent.   Patient's care partner is independent to provide the necessary physical assistance at discharge.  All goals met.   Recommendation:  Patient will benefit from ongoing skilled PT services in outpatient setting to continue to advance safe functional mobility, address ongoing impairments in balance, postural control, stepping strategies, gait kinematics, and minimize fall risk.  Equipment: RW  Reasons for discharge: treatment goals met and discharge from hospital  Patient/family agrees with progress made and goals achieved: Yes   PT Treatment Interventions: Session 1: Pt seated in w/c upon PT arrival, agreeable to therapy tx and denies pain. Pt ambulated throughout unit without AD, CGA 200 + ft. Pt ambulates with RW x150 ft modified independent and has been made independent in the room with use of RW only. Pt performed car transfer this session mod I. Pt ambulated across unlevel surfaces, ramp and mulch with CGA, no AD. Pt ambulated to the gym and participated in berg balance test as detailed below, scored 46/56 and discussed results with the pt. Pt ascended/descended 12 steps with mod I using rails. Pt worked on gait training this session with emphasis on R LE push off, verbal cues for techniques. Pt ambulated back to room and left in care of husband.   Session 2: Pt seated in bed upon PT arrival, agreeable to therapy tx and denies pain. Pt ambulated x 200 ft to the ortho gym with RW and mod I this  session. Pt continued with gait training this session using body weight support treadmill training (BWSTT) to include the following: Forwards ambulation - 5:15 min, 0.5-1.1 mph, 381 ft with verbal cues for step length, verbal cues for foot placement and mirror used for visual feedback.  Forwards ambulation - 5:47 min, 1.2 mph, 609 ft working on increased step length and gait speed. Backwards ambulation- 3:11 min at 0.7 mph, 160 ft with verbal cues for symmetric LE step length and foot placement.  Pt ambulated to the gym x 150 ft without AD emphasis on carry over with increased gait speed and step length, CGA. Pt performed floor transfers this session x1 using the mat for UE support and x 1 without UE support both with supervision. Pt worked on postural control and hip strengthening this session in order to performed walking on knees in tall kneeling forwards/backwards and then sideways in each direction, CGA for balance. Pt ambulated back to room with RW Mod I, x 150 ft. Pt left in bed with needs in reach.     PT Discharge Precautions/Restrictions Precautions Precautions: Fall Restrictions Weight Bearing Restrictions: No Pain Pain Assessment Pain Scale: 0-10 Pain Score: 0-No pain Cognition Overall Cognitive Status: Impaired/Different from baseline Arousal/Alertness: Awake/alert Orientation Level: Oriented X4 Attention: Alternating;Sustained Sustained Attention: Appears intact Alternating Attention: Impaired Awareness: Appears intact Safety/Judgment: Appears intact Sensation Sensation Light Touch: Appears Intact Proprioception: Appears Intact Additional Comments: Sensation appears intact B LEs.  Coordination Gross Motor Movements are Fluid and Coordinated: No Fine Motor Movements are Fluid and Coordinated: No Coordination and Movement Description: R UE/LE ataxia  Heel   Shin Test: slightly impaired R LE compared to L LE Motor  Motor Motor: Ataxia Motor - Skilled Clinical  Observations: R sided ataxia  Mobility Bed Mobility Bed Mobility: Rolling Right;Rolling Left;Supine to Sit;Sit to Supine Rolling Right: Independent Rolling Left: Independent Supine to Sit: Independent Sit to Supine: Independent Transfers Transfers: Sit to Stand;Stand to Sit;Stand Pivot Transfers Sit to Stand: Independent with assistive device Stand to Sit: Independent with assistive device Stand Pivot Transfers: Independent with assistive device Transfer (Assistive device): Rolling walker Locomotion  Gait Ambulation: Yes Gait Assistance: Independent with assistive device Gait Distance (Feet): 150 Feet Assistive device: Rolling walker Gait Assistance Details: Verbal cues for sequencing;Verbal cues for technique;Verbal cues for precautions/safety Gait Gait: Yes Gait Pattern: Impaired Gait Pattern: Scissoring;Ataxic;Decreased stance time - right Gait velocity: decreased Stairs / Additional Locomotion Stairs: Yes Stairs Assistance: Independent with assistive device Stair Management Technique: Two rails Height of Stairs: 6 Wheelchair Mobility Wheelchair Mobility: No  Trunk/Postural Assessment  Cervical Assessment Cervical Assessment: Within Functional Limits Thoracic Assessment Thoracic Assessment: Exceptions to WFL(hx of scoliosis) Lumbar Assessment Lumbar Assessment: Exceptions to WFL(hx scoliosis) Postural Control Postural Control: Within Functional Limits  Balance Balance Balance Assessed: Yes Standardized Balance Assessment Standardized Balance Assessment: Berg Balance Test Berg Balance Test Sit to Stand: Able to stand without using hands and stabilize independently Standing Unsupported: Able to stand safely 2 minutes Sitting with Back Unsupported but Feet Supported on Floor or Stool: Able to sit safely and securely 2 minutes Stand to Sit: Sits safely with minimal use of hands Transfers: Able to transfer safely, minor use of hands Standing Unsupported with Eyes  Closed: Able to stand 10 seconds safely Standing Ubsupported with Feet Together: Able to place feet together independently and stand for 1 minute with supervision From Standing, Reach Forward with Outstretched Arm: Can reach confidently >25 cm (10") From Standing Position, Pick up Object from Floor: Able to pick up shoe, needs supervision From Standing Position, Turn to Look Behind Over each Shoulder: Looks behind one side only/other side shows less weight shift Turn 360 Degrees: Able to turn 360 degrees safely but slowly Standing Unsupported, Alternately Place Feet on Step/Stool: Able to stand independently and complete 8 steps >20 seconds Standing Unsupported, One Foot in Front: Able to plae foot ahead of the other independently and hold 30 seconds Standing on One Leg: Tries to lift leg/unable to hold 3 seconds but remains standing independently Total Score: 46 Extremity Assessment  RLE Assessment RLE Assessment: Within Functional Limits LLE Assessment LLE Assessment: Within Functional Limits    Netta Corrigan, PT, DPT 01/15/2019, 11:11 AM

## 2019-01-15 NOTE — Progress Notes (Signed)
Occupational Therapy Session Note  Patient Details  Name: [name] MRN: [id number] Date of Birth: [birth year]  Today's Date: 01/15/2019 OT Individual Time: 0900-1000 OT Individual Time Calculation (min): 60 min    Short Term Goals: Week 1:  OT Short Term Goal 1 (Week 1): STGs=LTGs due to ELOS  Skilled Therapeutic Interventions/Progress Updates:    Pt resting in bed upon arrival.  Pt completed bathing/dressing tasks prior to OT session.  OT intervention with focus on funcitonal amb with/without AD, sit<>stand, standing balance, RUE functional use, activity tolerance, and safety awareness to increase independence with BADLs.  Pt completed 2 games of BIG Connect 4 game while standing with focus on RUE use and standing balance with side step. Pt also kneeled on floor to pick up connect 4 tokens from ground.  Pt engaged in reaching tasks outside BOS and above shoulder height. Pt returned to room without use of RW with focus on arm swing.  Pt remained seated EOB with husband present.   Therapy Documentation Precautions:  Precautions Precautions: Fall Restrictions Weight Bearing Restrictions: No   Pain: Pain Assessment Pain Scale: 0-10 Pain Score: 0-No pain   Therapy/Group: Individual Therapy  Leroy Libman 01/15/2019, 12:22 PM

## 2019-01-15 NOTE — Progress Notes (Signed)
Social Work  Discharge Note  The overall goal for the admission was met for:   Discharge location: [place]  Length of Stay: Yes-10 DAYS  Discharge activity level: Yes-SUPERVISION LEVEL  Home/community participation: Yes  Services provided included: MD, RD, PT, OT, SLP, RN, CM, TR, Pharmacy, Neuropsych and SW  Financial Services: Private Insurance: [facility]  Follow-up services arranged: Outpatient: [facility] OT 2/26 1:00-2:45 PM HAS EQUIPMENT FROM [facility]  Comments (or additional information):HUSBAND WAS HERE AND PARTICIPATED IN THERAPIES WITH PT AND COMFORTABLE WITH CARE AND READY TO GO HOME.  Patient/Family verbalized understanding of follow-up arrangements: Yes  Individual responsible for coordination of the follow-up plan: WES-HUSBAND  Confirmed correct DME delivered: Elease Hashimoto 01/15/2019    Elease Hashimoto

## 2019-01-15 NOTE — Progress Notes (Signed)
Physical Therapy Session Note  Patient Details  Name: [name] MRN: [id number] Date of Birth: 18-Dec-1980  Today's Date: 01/14/2019 PT Individual Time: 1545-1630 45 minutes of tx time     Short Term Goals: Week 1:  PT Short Term Goal 1 (Week 1): STG=LTG due to ELOS  Skilled Therapeutic Interventions/Progress Updates:    Pt supine in bed upon PT arrival, agreeable to therapy tx and denies pain. Pt transferred to sitting with supervision and ambulated into bathroom without AD and CGA, used bathroom and performed clothing management with supervision. Pt ambulated to the gym without AD x 150 ft with CGA-min assist. Session focused on higher level balance training with use of maxisky to decreased tactile input/feedback from therapist. Within maxisky pt performed forwards ambulation, backwards ambulation, 360 degree turns in both directions, sidestepping in both directions, stepping on/off objects including dynadisc, airex, bosu, 4 inch step and red wedge. Pt then completed obstacle course without use of maxisky to assess for carryover, CGA-min assist without AD pt ambulated across airex, 4 inch step and red wedge x 2 trials. Pt then ambulated while picking up objects off of the ground and carrying objects, min assist for balance. Pt ambulated back to room without AD, CGA x 150 ft. Pt left in bed with needs in reach and bed alarm set.   Therapy Documentation Precautions:  Precautions Precautions: Fall Restrictions Weight Bearing Restrictions: No    Therapy/Group: Individual Therapy  Netta Corrigan, PT, DPT 01/15/2019, 7:53 AM

## 2019-01-15 NOTE — Discharge Instructions (Signed)
Inpatient Rehab Discharge Instructions  [name] Discharge date and time: No discharge date for patient encounter.   Activities/Precautions/ Functional Status: Activity: activity as tolerated Diet: regular diet Wound Care: none needed Functional status:  ___ No restrictions     ___ Walk up steps independently ___ 24/7 supervision/assistance   ___ Walk up steps with assistance ___ Intermittent supervision/assistance  ___ Bathe/dress independently ___ Walk with walker     _x__ Bathe/dress with assistance ___ Walk Independently    ___ Shower independently ___ Walk with assistance    ___ Shower with assistance ___ No alcohol     ___ Return to work/school ________  Special Instructions: No driving    COMMUNITY REFERRALS UPON DISCHARGE:    Outpatient: PT & OT  Agency:[facility] Phone:[phone]   Date of Last Service:01/16/2019  Appointment Date/Time:FEBRUARY 26 Tuesday 1:00-2:45 PM  Medical Equipment/Items Ordered:NO NEEDS   GENERAL COMMUNITY RESOURCES FOR PATIENT/FAMILY: Support Groups:[facility] SUPPORT [facility] THE SECOND Thursday @ 6:00-7:00 PM ON THE REHAB UNIT QUESTIONS CONTACT AMY [phone]  STROKE/TIA DISCHARGE INSTRUCTIONS SMOKING Cigarette smoking nearly doubles your risk of having a stroke & is the single most alterable risk factor  If you smoke or have smoked in the last 12 months, you are advised to quit smoking for your health.  Most of the excess cardiovascular risk related to smoking disappears within a year of stopping.  Ask you doctor about anti-smoking medications  [facility] Quit Line: 1-800-QUIT NOW  Free Smoking Cessation Classes [phone]  CHOLESTEROL Know your levels; limit fat & cholesterol in your diet  Lipid Panel     Component Value Date/Time   CHOL 160 01/03/2019 0440   TRIG 74 01/03/2019 0440   HDL 57 01/03/2019 0440   CHOLHDL 2.8 01/03/2019 0440   VLDL 15 01/03/2019 0440   LDLCALC 88 01/03/2019 0440      Many patients  benefit from treatment even if their cholesterol is at goal.  Goal: Total Cholesterol (CHOL) less than 160  Goal:  Triglycerides (TRIG) less than 150  Goal:  HDL greater than 40  Goal:  LDL (LDLCALC) less than 100   BLOOD PRESSURE American Stroke Association blood pressure target is less that 120/80 mm/Hg  Your discharge blood pressure is:  BP: 126/88  Monitor your blood pressure  Limit your salt and alcohol intake  Many individuals will require more than one medication for high blood pressure  DIABETES (A1c is a blood sugar average for last 3 months) Goal HGBA1c is under 7% (HBGA1c is blood sugar average for last 3 months)  Diabetes: No known diagnosis of diabetes    Lab Results  Component Value Date   HGBA1C 5.7 (H) 01/03/2019     Your HGBA1c can be lowered with medications, healthy diet, and exercise.  Check your blood sugar as directed by your physician  Call your physician if you experience unexplained or low blood sugars.  PHYSICAL ACTIVITY/REHABILITATION Goal is 30 minutes at least 4 days per week  Activity: Increase activity slowly, Therapies: Physical Therapy: Home Health Return to work:   Activity decreases your risk of heart attack and stroke and makes your heart stronger.  It helps control your weight and blood pressure; helps you relax and can improve your mood.  Participate in a regular exercise program.  Talk with your doctor about the best form of exercise for you (dancing, walking, swimming, cycling).  DIET/WEIGHT Goal is to maintain a healthy weight  Your discharge diet is:  Diet Order  Diet Heart Room service appropriate? Yes; Fluid consistency: Thin  Diet effective now              liquids Your height is:  Height: 5\' 4"  (162.6 cm) Your current weight is: Weight: 73.3 kg Your Body Mass Index (BMI) is:  BMI (Calculated): 27.72  Following the type of diet specifically designed for you will help prevent another stroke.  Your goal weight  range is:    Your goal Body Mass Index (BMI) is 19-24.  Healthy food habits can help reduce 3 risk factors for stroke:  High cholesterol, hypertension, and excess weight.  RESOURCES Stroke/Support Group:  Call [phone]   STROKE EDUCATION PROVIDED/REVIEWED AND GIVEN TO PATIENT Stroke warning signs and symptoms How to activate emergency medical system (call 911). Medications prescribed at discharge. Need for follow-up after discharge. Personal risk factors for stroke. Pneumonia vaccine given:  Flu vaccine given:  My questions have been answered, the writing is legible, and I understand these instructions.  I will adhere to these goals & educational materials that have been provided to me after my discharge from the hospital.      My questions have been answered and I understand these instructions. I will adhere to these goals and the provided educational materials after my discharge from the hospital.  Patient/Caregiver Signature _______________________________ Date __________  Clinician Signature _______________________________________ Date __________  Please bring this form and your medication list with you to all your follow-up doctor's appointments.

## 2019-01-15 NOTE — Progress Notes (Signed)
Occupational Therapy Session Note  Patient Details  Name: [name] MRN: [id number] Date of Birth: Jan 21, 1981  Today's Date: 01/15/2019 OT Individual Time: 1300-1330 OT Individual Time Calculation (min): 30 min    Short Term Goals: Week 1:  OT Short Term Goal 1 (Week 1): STGs=LTGs due to ELOS  Skilled Therapeutic Interventions/Progress Updates:    OT intervention with focus on funtional amb with RW for simple home mgmt tasks, standing balance, discharge planning, home safety and activity tolerance.  Pt amb with RW to ADL apartment and practiced retrieving items from drawers and transporting to dirty linen bag.  Pt completed 9 hole peg test: L-23.24 secs, R-41.97.  Pt with improvement on both L and R by approx 3 seconds. Pt returned to room and remained seated EOB with husband present. Pt mod I in room.   Therapy Documentation Precautions:  Precautions Precautions: Fall Restrictions Weight Bearing Restrictions: No   Pain:  Pt denies pain   Therapy/Group: Individual Therapy  Leroy Libman 01/15/2019, 2:46 PM

## 2019-01-15 NOTE — Progress Notes (Signed)
[facility] PHYSICAL MEDICINE & REHABILITATION PROGRESS NOTE   Subjective/Complaints:  No issues overnite, amb with walker in hall  ROS: Patient denies CP, SOB, N/V/D Objective:   No results found. No results for input(s): WBC, HGB, HCT, PLT in the last 72 hours. No results for input(s): NA, K, CL, CO2, GLUCOSE, BUN, CREATININE, CALCIUM in the last 72 hours.  Intake/Output Summary (Last 24 hours) at 01/15/2019 1053 Last data filed at 01/14/2019 1300 Gross per 24 hour  Intake 240 ml  Output -  Net 240 ml     Physical Exam: Vital Signs Blood pressure 122/82, pulse 63, temperature 97.6 F (36.4 C), temperature source Oral, resp. rate 18, height 5\' 4"  (1.626 m), weight 74.4 kg, last menstrual period 12/17/2018, SpO2 97 %. Constitutional: No distress . Vital signs reviewed. HEENT: EOMI, oral membranes moist Neck: supple Cardiovascular: RRR without murmur. No JVD    Respiratory: CTA Bilaterally without wheezes or rales. Normal effort    GI: BS +, non-tender, non-distended  Musculoskeletal:Normal range of motion.  General: No tenderness,deformityor edema.  Comments:  Neurological: She isalertand oriented to person, place, and time.   Good insight and awareness.  Normal language and speech.  Strength 4/5 RUE 5/5 LUE , RLE 4 to 4+/5--which is unchanged.  LLE 5/5. Decreased fmc RUE more than left with dysmetria and mild ataxia. Senses pain in all 4's.    Skin: Skin iswarmand dry.  Psychiatric: Pleasant and cooperative..     Assessment/Plan: 1. 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  Physiatrist is providing close team supervision and 24 hour management of active medical problems listed below.  Physiatrist and rehab team continue to assess barriers to discharge/monitor patient  progress toward functional and medical goals  Care Tool:  Bathing    Body parts bathed by patient: Right arm, Left arm, Chest, Abdomen, Front perineal area, Buttocks, Right upper leg, Left upper leg, Right lower leg, Left lower leg, Face         Bathing assist Assist Level: Supervision/Verbal cueing     Upper Body Dressing/Undressing Upper body dressing   What is the patient wearing?: Pull over shirt, Bra    Upper body assist Assist Level: Supervision/Verbal cueing    Lower Body Dressing/Undressing Lower body dressing      What is the patient wearing?: Underwear/pull up, Pants     Lower body assist Assist for lower body dressing: Supervision/Verbal cueing     Toileting Toileting Toileting Activity did not occur (Clothing management and hygiene only): Refused  Toileting assist Assist for toileting: Supervision/Verbal cueing     Transfers Chair/bed transfer  Transfers assist     Chair/bed transfer assist level: Supervision/Verbal cueing     Locomotion Ambulation   Ambulation assist      Assist level: Contact Guard/Touching assist Assistive device: Other (comment)(none) Max distance: 150'   Walk 10 feet activity   Assist     Assist level: Contact Guard/Touching assist Assistive device: Other (comment)(none)   Walk 50 feet activity   Assist    Assist level: Contact Guard/Touching assist Assistive device: Other (comment)(none)    Walk 150 feet activity   Assist    Assist level: Contact Guard/Touching assist Assistive device: Other (comment)(none)    Walk 10 feet on uneven surface  activity   Assist     Assist level: Contact Guard/Touching assist Assistive  device: Aeronautical engineer Will patient use wheelchair at discharge?: No             Wheelchair 50 feet with 2 turns activity    Assist            Wheelchair 150 feet activity     Assist          Medical Problem List and  Plan: 1.Decreased functional mobility with ataxic gait and associated dizziness, nausea, headachesecondary to 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 DVT Prophylaxis/Anticoagulation: SCDs. Monitor for any signs of DVT 3. Pain Management:Tylenol as needed Consider adjustments to medications as necessary.  Patient is satisfied with control at present and does not want to add further medication at this time. 4. Mood/ADD/panic attacks:Zoloft 100 mg daily at bedtime -team and family to provide ego support  -trazodone prn for sleep 5. Neuropsych: This patientiscapable of making decisions on herown behalf.Hx anxiety but no signs of worsening , Adderall on hold for HTN, per therapy is having problems with concentration 6. Skin/Wound Care:Routine skin checks 7. Fluids/Electrolytes/Nutrition:Routine in and out's with follow-up chemistries -encourage PO intake 8. Elevated blood pressure: Monitor with increased mobility, check ortho BPs    9. Constipation: Continue to augment bowel regimen -generally improved   10. HTN: improved    Vitals:   01/14/19 2048 01/15/19 0425  BP: (!) 149/96 122/82  Pulse: 82 63  Resp: 18 18  Temp: 97.9 F (36.6 C) 97.6 F (36.4 C)  SpO2: 97% 97%  improving 2/24 was not on antihypertensive at home, discussed with patient options are to either try an agent while she is in the hospital or to follow-up with primary care.  She will discuss this with me after conferring with her husband. LOS: 9 days A FACE TO FACE EVALUATION WAS PERFORMED  Charlett Blake 01/15/2019, 10:53 AM

## 2019-01-15 NOTE — Progress Notes (Signed)
Occupational Therapy Discharge Summary  Patient Details  Name: [name] MRN: [id number] Date of Birth: Aug 14, 1981  Patient has met 70 of 10 long term goals due to improved activity tolerance, improved balance, postural control, ability to compensate for deficits, functional use of  RIGHT upper extremity, improved attention, improved awareness and improved coordination. Pt made excellent progress with BADLs during this admission.  Pt is independent with an assistive device with all bathing/dressing and toileting tasks.  Pt amb with RW for simple home mgmt tasks.  Pt continues to report double vision but wears partially occluded glasses to compensate.  Pt continues to exhibit mild RUE ataxia/weakness but uses her RUE functionally in all tasks. Pt's husband has been present and active in all aspects of pt's therapy.  Patient to discharge at overall Modified Independent level.  Patient's care partner is independent to provide the necessary physical assistance at discharge.     Recommendation:  Patient will benefit from ongoing skilled OT services in outpatient setting to continue to advance functional skills in the area of BADL, iADL and Reduce care partner burden.  Equipment: No equipment provided  Reasons for discharge: treatment goals met and discharge from hospital  Patient/family agrees with progress made and goals achieved: Yes  OT Discharge Vision Baseline Vision/History: Wears glasses Wears Glasses: Reading only Patient Visual Report: Diplopia Vision Assessment?: Vision impaired- to be further tested in functional context Eye Alignment: Within Functional Limits Ocular Range of Motion: Within Functional Limits Alignment/Gaze Preference: Within Defined Limits Tracking/Visual Pursuits: Able to track stimulus in all quads without difficulty Saccades: Within functional limits Convergence: Within functional limits Visual Fields: No apparent deficits Diplopia Assessment: Disappears  with one eye closed Additional Comments: pt wears partially occluded glasses during all functional tasks   Cognition Overall Cognitive Status: Impaired/Different from baseline Arousal/Alertness: Awake/alert Orientation Level: Oriented X4 Attention: Alternating;Sustained Sustained Attention: Appears intact Alternating Attention: Impaired Alternating Attention Impairment: Functional basic Memory: Appears intact Awareness: Appears intact Problem Solving: Appears intact Safety/Judgment: Appears intact Sensation Sensation Light Touch: Appears Intact Hot/Cold: Appears Intact Proprioception: Appears Intact Stereognosis: Appears Intact Additional Comments: Sensation appears intact B LEs.  Coordination Gross Motor Movements are Fluid and Coordinated: No Fine Motor Movements are Fluid and Coordinated: No Coordination and Movement Description: R UE/LE ataxia  Heel Shin Test: slightly impaired R LE compared to L LE Motor  Motor Motor: Ataxia Motor - Skilled Clinical Observations: R sided ataxia Mobility  Bed Mobility Bed Mobility: Rolling Right;Rolling Left;Supine to Sit;Sit to Supine Rolling Right: Independent Rolling Left: Independent Supine to Sit: Independent Sit to Supine: Independent Transfers Sit to Stand: Independent with assistive device Stand to Sit: Independent with assistive device  Trunk/Postural Assessment  Cervical Assessment Cervical Assessment: Within Functional Limits Thoracic Assessment Thoracic Assessment: Exceptions to WFL((hx of scoliosis) Lumbar Assessment Lumbar Assessment: Exceptions to WFL((hx of scoliosis) Postural Control Postural Control: Within Functional Limits  Balance Balance Balance Assessed: Yes Standardized Balance Assessment Standardized Balance Assessment: Berg Balance Test Berg Balance Test Sit to Stand: Able to stand without using hands and stabilize independently Standing Unsupported: Able to stand safely 2 minutes Sitting with  Back Unsupported but Feet Supported on Floor or Stool: Able to sit safely and securely 2 minutes Stand to Sit: Sits safely with minimal use of hands Transfers: Able to transfer safely, minor use of hands Standing Unsupported with Eyes Closed: Able to stand 10 seconds safely Standing Ubsupported with Feet Together: Able to place feet together independently and stand for 1 minute with supervision  From Standing, Reach Forward with Outstretched Arm: Can reach confidently >25 cm (10") From Standing Position, Pick up Object from Floor: Able to pick up shoe, needs supervision From Standing Position, Turn to Look Behind Over each Shoulder: Looks behind one side only/other side shows less weight shift Turn 360 Degrees: Able to turn 360 degrees safely but slowly Standing Unsupported, Alternately Place Feet on Step/Stool: Able to stand independently and complete 8 steps >20 seconds Standing Unsupported, One Foot in Front: Able to plae foot ahead of the other independently and hold 30 seconds Standing on One Leg: Tries to lift leg/unable to hold 3 seconds but remains standing independently Total Score: 46 Static Sitting Balance Static Sitting - Level of Assistance: 7: Independent Dynamic Sitting Balance Dynamic Sitting - Level of Assistance: 7: Independent Static Standing Balance Static Standing - Level of Assistance: 6: Modified independent (Device/Increase time) Dynamic Standing Balance Dynamic Standing - Level of Assistance: 6: Modified independent (Device/Increase time) Extremity/Trunk Assessment RUE Assessment RUE Assessment: Exceptions to [facility] Active Range of Motion (AROM) Comments: WNL General Strength Comments: mild ataxia RUE Body System: Neuro Brunstrum levels for arm and hand: Arm;Hand Brunstrum level for arm: Stage V Relative Independence from Synergy Brunstrum level for hand: Stage VI Isolated joint movements LUE Assessment LUE Assessment: Within Functional Limits   Leroy Libman 01/15/2019, 12:31 PM

## 2019-01-16 MED ORDER — ALPRAZOLAM 0.25 MG PO TABS: 0.2500 mg | ORAL_TABLET | Freq: Two times a day (BID) | ORAL | 0 refills | Status: DC | PRN

## 2019-01-16 MED ORDER — SERTRALINE HCL 100 MG PO TABS: 100.0000 mg | ORAL_TABLET | Freq: Every day | ORAL | 0 refills | Status: DC

## 2019-01-16 MED ORDER — ASPIRIN 325 MG PO TBEC: 325.0000 mg | DELAYED_RELEASE_TABLET | Freq: Every day | ORAL | 0 refills | Status: DC

## 2019-01-16 MED ORDER — ACETAMINOPHEN 325 MG PO TABS: 650.0000 mg | ORAL_TABLET | ORAL | Status: DC | PRN

## 2019-01-16 NOTE — Progress Notes (Signed)
Pt appears comfortable, resting in bed. Tylenol given earlier with relief. Family resting in room with patient.

## 2019-01-16 NOTE — Progress Notes (Signed)
Pt was discharged with all personal belongings/ Left the unit with husband and son. All questions were answered prior to discharge. Doy Hutching, LPN

## 2019-01-17 ENCOUNTER — Encounter: Payer: Self-pay | Admitting: Physical Therapy

## 2019-01-17 ENCOUNTER — Ambulatory Visit: Payer: 59 | Attending: Physical Medicine & Rehabilitation | Admitting: Physical Therapy

## 2019-01-17 ENCOUNTER — Ambulatory Visit: Payer: 59 | Admitting: Family Medicine

## 2019-01-17 ENCOUNTER — Ambulatory Visit: Payer: 59 | Admitting: Occupational Therapy

## 2019-01-17 DIAGNOSIS — R278 Other lack of coordination: Secondary | ICD-10-CM | POA: Diagnosis not present

## 2019-01-17 DIAGNOSIS — R2689 Other abnormalities of gait and mobility: Secondary | ICD-10-CM | POA: Insufficient documentation

## 2019-01-17 DIAGNOSIS — R41842 Visuospatial deficit: Secondary | ICD-10-CM

## 2019-01-17 DIAGNOSIS — M6281 Muscle weakness (generalized): Secondary | ICD-10-CM

## 2019-01-17 DIAGNOSIS — R2681 Unsteadiness on feet: Secondary | ICD-10-CM

## 2019-01-17 NOTE — Therapy (Deleted)
[facility] [address] [place], [place], [address] Phone: [phone]   Fax:  [phone]  Occupational Therapy Treatment  Patient Details  Name: [name] MRN: [id number] Date of Birth: 1981-02-05 Referring Provider (OT): Dr. Letta Pate   Encounter Date: 01/17/2019  OT End of Session - 01/17/19 1407    Visit Number  1    Number of Visits  17    Date for OT Re-Evaluation  03/18/19    Authorization Type  [employer] (23 visit limit for OT)    Authorization - Visit Number  1    Authorization - Number of Visits  23    OT Start Time  9233    OT Stop Time  1400    OT Time Calculation (min)  45 min    Activity Tolerance  Patient tolerated treatment well    Behavior During Therapy  [facility] for tasks assessed/performed;Impulsive       Past Medical History:  Diagnosis Date  . ADD (attention deficit disorder)   . Exercise-induced asthma   . Hx of varicella   . Kidney stones   . S/P cesarean section 05/05/2016  . S/P tubal ligation 05/05/2016  . Term pregnancy, repeat 05/04/2016    Past Surgical History:  Procedure Laterality Date  . BACK SURGERY     scoliosis age 59  . BILATERAL SALPINGECTOMY Bilateral 05/05/2016   Procedure: BILATERAL SALPINGECTOMY;  Surgeon: Janyth Contes, MD;  Location: [facility];  Service: Obstetrics;  Laterality: Bilateral;  . CESAREAN SECTION    . CESAREAN SECTION N/A 05/05/2016   Procedure: CESAREAN SECTION;  Surgeon: Janyth Contes, MD;  Location: [facility];  Service: Obstetrics;  Laterality: N/A;  . CHOLECYSTECTOMY    . HAND SURGERY    . HERNIA REPAIR    . KIDNEY STONE SURGERY     R--surgery; L lithotripsy.  Dr. Gaynelle Arabian    There were no vitals filed for this visit.  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 PMH: ADHD, panic attacks, ex induced asthma    Limitations  **fall risk, no driving, diplopia    Currently in Pain?  No/denies         [facility] OT Assessment - 01/17/19 0001      Assessment   Medical Diagnosis  Rt cerebellar and bilateral PCA infarcts (secondary to Rt verterbral artery dissection)    Referring Provider (OT)  Dr. Letta Pate    Onset Date/Surgical Date  01/02/19    Hand Dominance  Right      Precautions   Precautions  Fall    Precaution Comments  no driving, diplopia      Balance Screen   Has the patient fallen in the past 6 months  No      Home  Environment   Bathroom Shower/Tub  Walk-in Shower;Curtain    Additional Comments  Pt lives w/ husband and 4 childern (ages 70-15) in 2 story home. 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     ADL   Eating/Feeding  Modified independent   some w/ Rt dominant hand, some w/ Lt   Grooming  Modified independent    Upper Body Bathing  Modified independent  seated   Lower Body Bathing  Modified independent   seated   Upper Body Dressing  Increased time    Lower Body Dressing  Increased time    Toilet Transfer  Modified independent    Toileting -  Hygiene  Modified Independent    Tub/Shower Transfer  Supervision/safety    ADL comments  Pt has shower seat      IADL   Shopping  --   Husband/family doing right now   Light Housekeeping  Does not participate in any housekeeping tasks    Meal Prep  Needs to have meals prepared and served    [employer] on family or friends for transportation    Medication Management  Is responsible for taking medication in correct dosages at correct time      Mobility   Mobility Status  Needs assist    Mobility Status Comments  walks w/ hand assist or walker      Written Expression   Dominant Hand  Right    Handwriting  Increased time;90% legible   writes bigger, less control      Vision - History   Baseline Vision  No visual deficits      Vision Assessment   Eye Alignment  Impaired (comment)    Ocular Range of Motion  Within Functional Limits    Convergence  Impaired (comment)   diplopia improves w/ convergence   Diplopia Assessment  Disappears with one eye closed      Cognition   Cognition Comments  Pt is off ADHD medication, so less focused and more verbose t/o evaluation. Pt also appears more impulsive and w/ decreased anticipatory awareness      Sensation   Light Touch  Appears Intact      Coordination   Gross Motor Movements are Fluid and Coordinated  No    Fine Motor Movements are Fluid and Coordinated  No    9 Hole Peg Test  Right;Left    Right 9 Hole Peg Test  49.41 sec    Left 9 Hole Peg Test  21.75 sec    Coordination  Gross motor impaired dominant RUE - unable to catch ball when tossing Rt hand      Edema   Edema  none      ROM / Strength   AROM / PROM / Strength  AROM;Strength      AROM   Overall AROM Comments  BUE AROM WNL's, just slower/less control RUE      Strength   Overall Strength Comments  BUE MMT grossly 5/5      Hand Function   Right Hand Grip (lbs)  65 LBS    Left Hand Grip (lbs)  74 LBS                         OT Short Term Goals - 01/17/19 1414      OT SHORT TERM GOAL #1   Title  Independent w/ coordination HEP - 02/15/19    Time  4    Period  Weeks    Status  New      OT SHORT TERM GOAL #2   Title  Independent with diplopia HEP     Time  4    Period  Weeks    Status  New      OT SHORT TERM GOAL #3   Title  Improve fine motor coordination Rt hand as evidenced by performing 9 hole  peg test in 42 sec or under    Baseline  49.41 sec    Time  4    Period  Weeks    Status  New      OT SHORT TERM GOAL #4   Title  Pt to make snack from standing level w/ supervision    Time  4    Period  Weeks    Status  New      OT SHORT TERM GOAL #5   Title  Pt to perform light household tasks [employer]chant [employer] officer,  Manufacturing engineer) w/ counter support x 10 min. w/o rest or LOB    Time  4    Period  Weeks    Status  New      Additional Short Term Goals   Additional Short Term Goals  Yes      OT SHORT TERM GOAL #6   Title  Pt to read 1 line of enlarged text w/ improvements in diplopia per pt report    Time  4    Period  Weeks    Status  New        OT Long Term Goals - 01/17/19 1418      OT LONG TERM GOAL #1   Title  Independent with updated HEP prn - 03/18/19    Time  8    Period  Weeks    Status  New      OT LONG TERM GOAL #2   Title  Pt to return to light cooking tasks for 20 min. w/ distant supervision    Time  8    Period  Weeks    Status  New      OT LONG TERM GOAL #3   Title  Pt to return to moderately complex household tasks w/ distant supervision    Time  8    Period  Weeks    Status  New      OT LONG TERM GOAL #4   Title  Pt to improve grip strength Rt dominant hand by 10 lbs     Baseline  65 lbs    Time  8    Period  Weeks    Status  New      OT LONG TERM GOAL #5   Title  Pt to improve fine motor coordination as evidenced by reducing speed on 9 hole peg test to 35 sec. or less    Baseline  49.41 sec    Time  8    Period  Weeks    Status  New      Long Term Additional Goals   Additional Long Term Goals  Yes      OT LONG TERM GOAL #6   Title  Pt able to read paragraph of text w/ minimal diplopia    Time  8    Period  Weeks    Status  New            Plan - 01/17/19 1409    Clinical Impression Statement  Pt is a 38 y.o. female who presents to outpatient rehab s/p Rt cerebellar and bilateral PCA infarcts secondary Rt vertebral artery dissection on 01/02/19. Pt presents with diplopia, gross and fine motor coordination deficits, and decreased balance, and would benefit from O.T. to address these deficits and how they affect functional tasks and life roles    Occupational Profile and client history currently impacting functional performance  Pt is  wife and mother of 4 children.  Current deficits affect ability to perform ADLS, activities, life roles. PMH: ADHD, panic attacks, exercise induced asthma    Occupational performance deficits (Please refer to evaluation for details):  ADL's;IADL's;Leisure;Other   childcare   Rehab Potential  Good    Current Impairments/barriers affecting progress:  diplopia    OT Frequency  2x / week    OT Duration  8 weeks   plus eval   OT Treatment/Interventions  Self-care/ADL training;Therapeutic exercise;Visual/perceptual remediation/compensation;Coping strategies training;Neuromuscular education;Patient/family education;Therapeutic activities;Functional Mobility Training;DME and/or AE instruction;Passive range of motion    Plan  coordination HEP for fine motor and gross motor RUE, vision HEP    Clinical Decision Making  Several treatment options, min-mod task modification necessary    Consulted and Agree with Plan of Care  Patient       Patient will benefit from skilled therapeutic intervention in order to improve the following deficits and impairments:  Impaired vision/preception, Decreased mobility, Decreased coordination, Decreased strength, Decreased activity tolerance, Decreased endurance, Decreased knowledge of precautions, Decreased balance, Impaired UE functional use  Visit Diagnosis: Other lack of coordination - Plan: Ot plan of care cert/re-cert  Visuospatial deficit - Plan: Ot plan of care cert/re-cert  Unsteadiness on feet - Plan: Ot plan of care cert/re-cert  Muscle weakness (generalized) - Plan: Ot plan of care cert/re-cert    Problem List Patient Active Problem List   Diagnosis Date Noted  . Vertebral artery dissection ([place])   . Elevated blood pressure reading   . Major depressive disorder   . Panic disorder   . CVA (cerebral vascular accident) ([place]) 01/02/2019  . Acute ischemic stroke ([place]) 01/02/2019  . S/P cesarean section 05/05/2016  . S/P tubal ligation 05/05/2016  .  Term pregnancy, repeat 05/04/2016  . Unspecified vitamin D deficiency 12/18/2012  . ADHD (attention deficit hyperactivity disorder) 11/26/2011  . Dysthymia 11/26/2011    Carey Bullocks 01/17/2019, 2:28 PM  [facility] [address] [place], [place], [address] Phone: [phone]   Fax:  [phone]  Name: [name] MRN: [id number] Date of Birth: 11/22/1981

## 2019-01-17 NOTE — Therapy (Signed)
[facility] [address] [place], [place], [address] Phone: [phone]   Fax:  [phone]  Occupational Therapy Evaluation  Patient Details  Name: [name] MRN: [id number] Date of Birth: 04-02-1981 Referring Provider (OT): Dr. Letta Pate   Encounter Date: 01/17/2019  OT End of Session - 01/17/19 1407    Visit Number  1    Number of Visits  17    Date for OT Re-Evaluation  03/18/19    Authorization Type  [employer] (23 visit limit for OT)    Authorization - Visit Number  1    Authorization - Number of Visits  23    OT Start Time  5916    OT Stop Time  1400    OT Time Calculation (min)  45 min    Activity Tolerance  Patient tolerated treatment well    Behavior During Therapy  [facility] for tasks assessed/performed;Impulsive       Past Medical History:  Diagnosis Date  . ADD (attention deficit disorder)   . Exercise-induced asthma   . Hx of varicella   . Kidney stones   . S/P cesarean section 05/05/2016  . S/P tubal ligation 05/05/2016  . Term pregnancy, repeat 05/04/2016    Past Surgical History:  Procedure Laterality Date  . BACK SURGERY     scoliosis age 81  . BILATERAL SALPINGECTOMY Bilateral 05/05/2016   Procedure: BILATERAL SALPINGECTOMY;  Surgeon: Janyth Contes, MD;  Location: [facility];  Service: Obstetrics;  Laterality: Bilateral;  . CESAREAN SECTION    . CESAREAN SECTION N/A 05/05/2016   Procedure: CESAREAN SECTION;  Surgeon: Janyth Contes, MD;  Location: [facility];  Service: Obstetrics;  Laterality: N/A;  . CHOLECYSTECTOMY    . HAND SURGERY    . HERNIA REPAIR    . KIDNEY STONE SURGERY     R--surgery; L lithotripsy.  Dr. Gaynelle Arabian    There were no vitals filed for this visit.  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 PMH: ADHD, panic attacks, ex induced asthma    Limitations  **fall risk, no driving, diplopia    Currently in Pain?  No/denies        [facility] OT Assessment - 01/17/19 0001      Assessment   Medical Diagnosis  Rt cerebellar and bilateral PCA infarcts (secondary to Rt verterbral artery dissection)    Referring Provider (OT)  Dr. Letta Pate    Onset Date/Surgical Date  01/02/19    Hand Dominance  Right      Precautions   Precautions  Fall    Precaution Comments  no driving, diplopia      Balance Screen   Has the patient fallen in the past 6 months  No      Home  Environment   Bathroom Shower/Tub  Walk-in Shower;Curtain    Additional Comments  Pt lives w/ husband and 4 childern (ages 84-15) in 2 story home. 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     ADL   Eating/Feeding  Modified independent   some w/ Rt dominant hand, some w/ Lt   Grooming  Modified independent    Upper Body Bathing  Modified independent  seated   Lower Body Bathing  Modified independent   seated   Upper Body Dressing  Increased time    Lower Body Dressing  Increased time    Toilet Transfer  Modified independent    Toileting -  Hygiene  Modified Independent    Tub/Shower Transfer  Supervision/safety    ADL comments  Pt has shower seat      IADL   Shopping  --   Husband/family doing right now   Light Housekeeping  Does not participate in any housekeeping tasks    Meal Prep  Needs to have meals prepared and served    [employer] on family or friends for transportation    Medication Management  Is responsible for taking medication in correct dosages at correct time      Mobility   Mobility Status  Needs assist    Mobility Status Comments  walks w/ hand assist or walker      Written Expression   Dominant Hand  Right    Handwriting  Increased time;90% legible   writes bigger, less control     Vision  - History   Baseline Vision  No visual deficits      Vision Assessment   Eye Alignment  Impaired (comment)    Ocular Range of Motion  Within Functional Limits    Convergence  Impaired (comment)   diplopia improves w/ convergence   Diplopia Assessment  Disappears with one eye closed      Cognition   Cognition Comments  Pt is off ADHD medication, so less focused and more verbose t/o evaluation. Pt also appears more impulsive and w/ decreased anticipatory awareness      Sensation   Light Touch  Appears Intact      Coordination   Gross Motor Movements are Fluid and Coordinated  No    Fine Motor Movements are Fluid and Coordinated  No    9 Hole Peg Test  Right;Left    Right 9 Hole Peg Test  49.41 sec    Left 9 Hole Peg Test  21.75 sec    Coordination  Gross motor impaired dominant RUE - unable to catch ball when tossing Rt hand      Edema   Edema  none      ROM / Strength   AROM / PROM / Strength  AROM;Strength      AROM   Overall AROM Comments  BUE AROM WNL's, just slower/less control RUE      Strength   Overall Strength Comments  BUE MMT grossly 5/5      Hand Function   Right Hand Grip (lbs)  65 LBS    Left Hand Grip (lbs)  74 LBS                        OT Short Term Goals - 01/17/19 1414      OT SHORT TERM GOAL #1   Title  Independent w/ coordination HEP - 02/15/19    Time  4    Period  Weeks    Status  New      OT SHORT TERM GOAL #2   Title  Independent with diplopia HEP     Time  4    Period  Weeks    Status  New      OT SHORT TERM GOAL #3   Title  Improve fine motor coordination Rt hand as evidenced by performing 9 hole peg  test in 42 sec or under    Baseline  49.41 sec    Time  4    Period  Weeks    Status  New      OT SHORT TERM GOAL #4   Title  Pt to make snack from standing level w/ supervision    Time  4    Period  Weeks    Status  New      OT SHORT TERM GOAL #5   Title  Pt to perform light household tasks [employer]chant [employer] officer,  Manufacturing engineer) w/ counter support x 10 min. w/o rest or LOB    Time  4    Period  Weeks    Status  New      Additional Short Term Goals   Additional Short Term Goals  Yes      OT SHORT TERM GOAL #6   Title  Pt to read 1 line of enlarged text w/ improvements in diplopia per pt report    Time  4    Period  Weeks    Status  New        OT Long Term Goals - 01/17/19 1418      OT LONG TERM GOAL #1   Title  Independent with updated HEP prn - 03/18/19    Time  8    Period  Weeks    Status  New      OT LONG TERM GOAL #2   Title  Pt to return to light cooking tasks for 20 min. w/ distant supervision    Time  8    Period  Weeks    Status  New      OT LONG TERM GOAL #3   Title  Pt to return to moderately complex household tasks w/ distant supervision    Time  8    Period  Weeks    Status  New      OT LONG TERM GOAL #4   Title  Pt to improve grip strength Rt dominant hand by 10 lbs     Baseline  65 lbs    Time  8    Period  Weeks    Status  New      OT LONG TERM GOAL #5   Title  Pt to improve fine motor coordination as evidenced by reducing speed on 9 hole peg test to 35 sec. or less    Baseline  49.41 sec    Time  8    Period  Weeks    Status  New      Long Term Additional Goals   Additional Long Term Goals  Yes      OT LONG TERM GOAL #6   Title  Pt able to read paragraph of text w/ minimal diplopia    Time  8    Period  Weeks    Status  New            Plan - 01/17/19 1409    Clinical Impression Statement  Pt is a 38 y.o. female who presents to outpatient rehab s/p Rt cerebellar and bilateral PCA infarcts secondary Rt vertebral artery dissection on 01/02/19. Pt presents with diplopia, gross and fine motor coordination deficits, and decreased balance, and would benefit from O.T. to address these deficits and how they affect functional tasks and life roles    Occupational Profile and client history currently impacting functional performance  Pt is  wife and mother of 4 children. Current  deficits affect ability to perform ADLS, activities, life roles. PMH: ADHD, panic attacks, exercise induced asthma    Occupational performance deficits (Please refer to evaluation for details):  ADL's;IADL's;Leisure;Other   childcare   Rehab Potential  Good    Current Impairments/barriers affecting progress:  diplopia    OT Frequency  2x / week    OT Duration  8 weeks   plus eval   OT Treatment/Interventions  Self-care/ADL training;Therapeutic exercise;Visual/perceptual remediation/compensation;Coping strategies training;Neuromuscular education;Patient/family education;Therapeutic activities;Functional Mobility Training;DME and/or AE instruction;Passive range of motion    Plan  coordination HEP for fine motor and gross motor RUE, vision HEP    Clinical Decision Making  Several treatment options, min-mod task modification necessary    Consulted and Agree with Plan of Care  Patient       Patient will benefit from skilled therapeutic intervention in order to improve the following deficits and impairments:  Impaired vision/preception, Decreased mobility, Decreased coordination, Decreased strength, Decreased activity tolerance, Decreased endurance, Decreased knowledge of precautions, Decreased balance, Impaired UE functional use  Visit Diagnosis: Other lack of coordination - Plan: Ot plan of care cert/re-cert  Visuospatial deficit - Plan: Ot plan of care cert/re-cert  Unsteadiness on feet - Plan: Ot plan of care cert/re-cert  Muscle weakness (generalized) - Plan: Ot plan of care cert/re-cert    Problem List Patient Active Problem List   Diagnosis Date Noted  . Vertebral artery dissection ([place])   . Elevated blood pressure reading   . Major depressive disorder   . Panic disorder   . CVA (cerebral vascular accident) ([place]) 01/02/2019  . Acute ischemic stroke ([place]) 01/02/2019  . S/P cesarean section 05/05/2016  . S/P tubal ligation 05/05/2016  .  Term pregnancy, repeat 05/04/2016  . Unspecified vitamin D deficiency 12/18/2012  . ADHD (attention deficit hyperactivity disorder) 11/26/2011  . Dysthymia 11/26/2011    Carey Bullocks, OTR/L 01/17/2019, 2:28 PM  [facility] [address] [place], [place], [address] Phone: [phone]   Fax:  [phone]  Name: [name] MRN: [id number] Date of Birth: [birth year]

## 2019-01-17 NOTE — Therapy (Signed)
[facility] [address] [place], [place], [address] Phone: [phone]   Fax:  [phone]  Physical Therapy Evaluation  Patient Details  Name: [name] MRN: [id number] Date of Birth: 08-Jan-1981 Referring Provider (PT): Dr. [name] Penna   Encounter Date: 01/17/2019  PT End of Session - 01/17/19 1447    Visit Number  1    Number of Visits  17    Date for PT Re-Evaluation  03/21/19    Authorization Type  [employer]    PT Start Time  1400    PT Stop Time  1442    PT Time Calculation (min)  42 min    Activity Tolerance  Patient tolerated treatment well    Behavior During Therapy  [facility] for tasks assessed/performed       Past Medical History:  Diagnosis Date  . ADD (attention deficit disorder)   . Exercise-induced asthma   . Hx of varicella   . Kidney stones   . S/P cesarean section 05/05/2016  . S/P tubal ligation 05/05/2016  . Term pregnancy, repeat 05/04/2016    Past Surgical History:  Procedure Laterality Date  . BACK SURGERY     scoliosis age 67  . BILATERAL SALPINGECTOMY Bilateral 05/05/2016   Procedure: BILATERAL SALPINGECTOMY;  Surgeon: Janyth Contes, MD;  Location: [place] [facility];  Service: Obstetrics;  Laterality: Bilateral;  . CESAREAN SECTION    . CESAREAN SECTION N/A 05/05/2016   Procedure: CESAREAN SECTION;  Surgeon: Janyth Contes, MD;  Location: [facility];  Service: Obstetrics;  Laterality: N/A;  . CHOLECYSTECTOMY    . HAND SURGERY    . HERNIA REPAIR    . KIDNEY STONE SURGERY     R--surgery; L lithotripsy.  Dr. Gaynelle Arabian    There were no vitals filed for this visit.   Subjective Assessment - 01/17/19 1357    Subjective  Recently discharged form CIR on 01/16/19 s/p R cerebellar and B PCA infarcts on 01/02/19. Has been started on Aspirin. 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 [name] Penna    Onset Date/Surgical Date  01/02/19    Hand Dominance  Right    Prior Therapy  CIR level rehab      Precautions   Precautions  Fall    Precaution Comments  no driving, diplopia      Restrictions   Weight Bearing Restrictions  No      Balance Screen   Has the patient fallen in the past 6 months  No    Has the patient had a decrease in activity level because of a fear of falling?   No    Is the patient reluctant to leave their home because of a fear of falling?   No      Home Social worker  Private residence    Living Arrangements  Spouse/significant other;Children  Available Help at Discharge  Family    Type of [place] to enter    Entrance Stairs-Number of Steps  4    Entrance Stairs-Rails  None    Home Layout  Two level;Able to live on main level with bedroom/bathroom    Alternate Level Stairs-Number of Steps  15    Alternate Level Stairs-Rails  Right    Home Equipment  Walker - 2 wheels      Prior Function   Level of Independence  Independent    Vocation  --   stay at home mom   Leisure  I want to get up and down the steps      Cognition   Overall Cognitive Status  Within Functional Limits for tasks assessed      Sensation   Light Touch  Appears Intact      Coordination   Gross Motor Movements are Fluid and Coordinated  No    Fine Motor Movements are Fluid and Coordinated  No    Coordination and Movement Description  R UE/LE ataxia     Heel Shin Test  slightly impaired R LE compared to L LE       Posture/Postural Control   Posture/Postural Control  Postural limitations    Postural Limitations  Rounded Shoulders;Forward head      ROM / Strength   AROM / PROM / Strength  AROM;Strength      AROM   Overall AROM Comments  B LE AROM WNL      Strength   Overall Strength Comments  B LE MMT grossly 5/5      Ambulation/Gait   Ambulation/Gait  Yes    Ambulation/Gait Assistance  4: Min guard;5: Supervision    Ambulation/Gait Assistance Details  Ataxic gait pattern with poor coordination of R LE; unsteadiness throughout    Assistive device  1 person hand held assist;None    Gait Pattern  Step-through pattern;Decreased stride length;Decreased weight shift to right;Ataxic    Ambulation Surface  Level;Indoor    Gait velocity  1.9 ft/sec      Balance   Balance Assessed  Yes      Standardized Balance Assessment   Standardized Balance Assessment  Berg Balance Test      Berg Balance Test   Sit to Stand  Able to stand without using hands and stabilize independently    Standing Unsupported  Able to stand safely 2 minutes    Sitting with Back Unsupported but Feet Supported on Floor or Stool  Able to sit safely and securely 2 minutes    Stand to Sit  Sits safely with minimal use of hands    Transfers  Able to transfer safely, minor use of hands    Standing Unsupported with Eyes Closed  Able to stand 10 seconds with supervision    Standing Ubsupported with Feet Together  Able to place feet together independently and stand for 1 minute with supervision    From Standing, Reach Forward with Outstretched Arm  Can reach confidently >25 cm (10")    From Standing Position, Pick up Object from Floor  Able to pick up shoe, needs supervision    From Standing Position, Turn to Look Behind Over each Shoulder  Looks behind one side only/other side shows less weight shift    Turn 360 Degrees  Able to turn 360 degrees safely but slowly    Standing Unsupported, Alternately Place Feet on Step/Stool  Able  to  complete >2 steps/needs minimal assist    Standing Unsupported, One Foot in Front  Able to plae foot ahead of the other independently and hold 30 seconds    Standing on One Leg  Tries to lift leg/unable to hold 3 seconds but remains standing independently    Total Score  43      Functional Gait  Assessment   Gait assessed   Yes    Gait Level Surface  Walks 20 ft, slow speed, abnormal gait pattern, evidence for imbalance or deviates 10-15 in outside of the 12 in walkway width. Requires more than 7 sec to ambulate 20 ft.    Change in Gait Speed  Able to change speed, demonstrates mild gait deviations, deviates 6-10 in outside of the 12 in walkway width, or no gait deviations, unable to achieve a major change in velocity, or uses a change in velocity, or uses an assistive device.    Gait with Horizontal Head Turns  Performs head turns smoothly with slight change in gait velocity (eg, minor disruption to smooth gait path), deviates 6-10 in outside 12 in walkway width, or uses an assistive device.    Gait with Vertical Head Turns  Performs task with slight change in gait velocity (eg, minor disruption to smooth gait path), deviates 6 - 10 in outside 12 in walkway width or uses assistive device    Gait and Pivot Turn  Turns slowly, requires verbal cueing, or requires several small steps to catch balance following turn and stop    Step Over Obstacle  Is able to step over one shoe box (4.5 in total height) but must slow down and adjust steps to clear box safely. May require verbal cueing.    Gait with Narrow Base of Support  Ambulates less than 4 steps heel to toe or cannot perform without assistance.    Gait with Eyes Closed  Walks 20 ft, slow speed, abnormal gait pattern, evidence for imbalance, deviates 10-15 in outside 12 in walkway width. Requires more than 9 sec to ambulate 20 ft.    Ambulating Backwards  Walks 20 ft, slow speed, abnormal gait pattern, evidence for imbalance, deviates 10-15 in outside  12 in walkway width.    Steps  Alternating feet, must use rail.    Total Score  13                Objective measurements completed on examination: See above findings.              PT Education - 01/17/19 1447    Education Details  exam findings, fall risk stratification, goals, POC    Person(s) Educated  Patient    Methods  Explanation    Comprehension  Verbalized understanding       PT Short Term Goals - 01/17/19 1619      PT SHORT TERM GOAL #1   Title  patient to be independent with initial HEP    Time  4    Period  Weeks    Status  New    Target Date  02/14/19      PT SHORT TERM GOAL #2   Title  patient to improve gait speed with no AD to >/= 2.62 ft/sec    Time  4    Period  Weeks    Status  New    Target Date  02/14/19      PT SHORT TERM GOAL #3   Title  patient to improve Merrilee Jansky  to >/= 48/56 demonstrating improved balance    Time  4    Period  Weeks    Status  New    Target Date  02/14/19      PT SHORT TERM GOAL #4   Title  patient to improve FGA to >/= 16/30    Time  4    Period  Weeks    Status  New    Target Date  02/14/19      PT SHORT TERM GOAL #5   Title  patient to ambulate up/down 1 flight of steps with single to no handrail and reciprocal pattern    Time  4    Period  Weeks    Status  New    Target Date  02/14/19        PT Long Term Goals - 01/17/19 1621      PT LONG TERM GOAL #1   Title  patient to be independent with advanced HEP    Time  8    Period  Weeks    Status  New    Target Date  03/21/19      PT LONG TERM GOAL #2   Title  patient to improve Berg to >/= 52/56    Time  8    Period  Weeks    Status  New    Target Date  03/21/19      PT LONG TERM GOAL #3   Title  patient to improve FGA to >/= 20/30    Time  8    Period  Weeks    Status  New    Target Date  03/21/19      PT LONG TERM GOAL #4   Title  patient to demonstrate gait over various surfaces and inclines with Mod I without LOB with LRAD for  >/= 500 feet    Time  8    Period  Weeks    Status  New    Target Date  03/21/19      PT LONG TERM GOAL #5   Title  patient to improve gait speed to >/= 3.5 ft/sec    Time  8    Period  Weeks    Status  New    Target Date  03/21/19             Plan - 01/17/19 1615    Clinical Impression Statement  Mrs. Roseman is a very pleasant 39 y/o female presenting to [facility] today s/p CVA on 01/02/19 with recent CIR discharge on 01/15/19. Patient presenting with gait deficits, poor balance, uncoordinated RUE/LE, decreased fine and gross motor of R UE/LE, reduced safety awareness and reduced functional mobility - all placing her at an increased fall risk. Patient today with gait velocity of 1.9 ft/sec, Berg 43/56 adn FGA 13/30 - also all demonstrating high fall risk.  Patient to benefit from skilled PT intervention to address the above listed deficits to allow for improved safe and independent functional mobility ot progress towards PLOF.     History and Personal Factors relevant to plan of care:  CVA, ADHD/ADD, panic attacks, exercise induced asthma    Clinical Presentation  Evolving    Clinical Presentation due to:  CVA, ADHD/ADD, panic attacks, exercise induced asthma, currenty not driving or holding small child    Clinical Decision Making  Moderate    Rehab Potential  Good    PT Frequency  2x / week    PT Duration  8 weeks  PT Treatment/Interventions  ADLs/Self Care Home Management;Cryotherapy;Electrical Stimulation;Functional mobility training;Stair training;Gait training;DME Instruction;Moist Heat;Therapeutic activities;Therapeutic exercise;Balance training;Neuromuscular re-education;Patient/family education;Orthotic Fit/Training;Passive range of motion;Manual techniques;Taping;Vestibular    PT Next Visit Plan  establish HEP for balance    Consulted and Agree with Plan of Care  Patient       Patient will benefit from skilled therapeutic intervention in order to improve the following deficits  and impairments:  Abnormal gait, Decreased balance, Decreased mobility, Decreased strength, Decreased coordination, Difficulty walking, Decreased safety awareness  Visit Diagnosis: Unsteadiness on feet  Other lack of coordination  Other abnormalities of gait and mobility     Problem List Patient Active Problem List   Diagnosis Date Noted  . Vertebral artery dissection ([place])   . Elevated blood pressure reading   . Major depressive disorder   . Panic disorder   . CVA (cerebral vascular accident) ([place]) 01/02/2019  . Acute ischemic stroke ([place]) 01/02/2019  . S/P cesarean section 05/05/2016  . S/P tubal ligation 05/05/2016  . Term pregnancy, repeat 05/04/2016  . Unspecified vitamin D deficiency 12/18/2012  . ADHD (attention deficit hyperactivity disorder) 11/26/2011  . Dysthymia 11/26/2011   Lanney Gins, PT, DPT Supplemental Physical Therapist 01/17/19 4:26 PM Pager: [phone] Office: [place] [facility] [address] [place], [place], [address] Phone: [phone]   Fax:  [phone]  Name: [name] MRN: [id number] Date of Birth: Apr 12, 1981

## 2019-01-17 NOTE — Addendum Note (Signed)
Addended by: Lanney Gins on: 01/17/2019 04:27 PM   Modules accepted: Orders

## 2019-01-18 ENCOUNTER — Telehealth: Payer: Self-pay | Admitting: Registered Nurse

## 2019-01-18 NOTE — Telephone Encounter (Signed)
Placed another call to Mr. Wilkowski, no answer. Left message to return the call.

## 2019-01-18 NOTE — Telephone Encounter (Signed)
Placed a call to Mr. Spark ( husband), no answer. Left message to return the call.

## 2019-01-22 ENCOUNTER — Encounter: Payer: Self-pay | Admitting: Rehabilitative and Restorative Service Providers"

## 2019-01-22 ENCOUNTER — Ambulatory Visit: Payer: 59 | Admitting: Rehabilitative and Restorative Service Providers"

## 2019-01-22 DIAGNOSIS — R2689 Other abnormalities of gait and mobility: Secondary | ICD-10-CM

## 2019-01-22 DIAGNOSIS — R278 Other lack of coordination: Secondary | ICD-10-CM

## 2019-01-22 DIAGNOSIS — H9312 Tinnitus, left ear: Secondary | ICD-10-CM | POA: Diagnosis present

## 2019-01-22 DIAGNOSIS — I7774 Dissection of vertebral artery: Secondary | ICD-10-CM | POA: Diagnosis not present

## 2019-01-22 DIAGNOSIS — I10 Essential (primary) hypertension: Secondary | ICD-10-CM | POA: Diagnosis not present

## 2019-01-22 DIAGNOSIS — R2681 Unsteadiness on feet: Secondary | ICD-10-CM

## 2019-01-22 DIAGNOSIS — F410 Panic disorder [episodic paroxysmal anxiety] without agoraphobia: Secondary | ICD-10-CM | POA: Diagnosis present

## 2019-01-22 NOTE — Therapy (Signed)
[facility] [address] [place], [place], [address] Phone: [phone]   Fax:  [phone]  Physical Therapy Treatment  Patient Details  Name: [name] MRN: [id number] Date of Birth: 02/13/1981 Referring Provider (PT): Dr. [name] Penna   Encounter Date: 01/22/2019  PT End of Session - 01/22/19 1216    Visit Number  2    Number of Visits  17    Date for PT Re-Evaluation  03/21/19    Authorization Type  UHC    PT Start Time  1018    PT Stop Time  1105    PT Time Calculation (min)  47 min    Activity Tolerance  Patient tolerated treatment well    Behavior During Therapy  [facility] for tasks assessed/performed       Past Medical History:  Diagnosis Date  . ADD (attention deficit disorder)   . Exercise-induced asthma   . Hx of varicella   . Kidney stones   . S/P cesarean section 05/05/2016  . S/P tubal ligation 05/05/2016  . Term pregnancy, repeat 05/04/2016    Past Surgical History:  Procedure Laterality Date  . BACK SURGERY     scoliosis age 27  . BILATERAL SALPINGECTOMY Bilateral 05/05/2016   Procedure: BILATERAL SALPINGECTOMY;  Surgeon: Janyth Contes, MD;  Location: [facility];  Service: Obstetrics;  Laterality: Bilateral;  . CESAREAN SECTION    . CESAREAN SECTION N/A 05/05/2016   Procedure: CESAREAN SECTION;  Surgeon: Janyth Contes, MD;  Location: [facility];  Service: Obstetrics;  Laterality: N/A;  . CHOLECYSTECTOMY    . HAND SURGERY    . HERNIA REPAIR    . KIDNEY STONE SURGERY     R--surgery; L lithotripsy.  Dr. Gaynelle Arabian    There were no vitals filed for this visit.  Subjective Assessment - 01/22/19 1026    Subjective  The patient reports she is getting stronger.   She holds onto PT''s hand walking into clinic.    Pertinent History  CVA, ADHD/ADD, panic attacks, exercise induced asthma    Patient Stated Goals  "Using my Right side" - feels as if it is weaker and  discoordinated    Currently in Pain?  No/denies                       [facility] Adult PT Treatment/Exercise - 01/22/19 1222      Ambulation/Gait   Ambulation/Gait  Yes    Ambulation/Gait Assistance  4: Min guard;4: Min assist;5: Supervision    Ambulation/Gait Assistance Details  The patient takes slow steps with shortened stride length.  Walking into clinic, she requested a HHA by PT.,  In therapy, PT provided tactile cues posteriorly to patient at shoulders to facilitate minimal arm swing, which in turn does help lengthen steps.  PT also had patient walk without proprioceptive feedback working on longer step length    Ambulation Distance (Feet)  230 Feet    Assistive device  1 person hand held assist;None    Gait Pattern  Step-through pattern;Decreased stride length;Decreased weight shift to right;Ataxic    Ambulation Surface  Level;Indoor      Self-Care   Self-Care  Other Self-Care Comments    Other Self-Care Comments   PT and patient discussed vision.  She does have increased unsteadiness without lenses-- will work on this in therapy.   Patient inquired about her ability to stay home alone in July when spouse is traveling for work.  PT discussed that from her current status, I feel she will be able to stay on her own, however driving may not be possible. Therefore, she may make other arrangements to have rides for kids.      Neuro Re-ed    Neuro Re-ed Details   Corner balance exercises performing feet apart with eyes closed without difficulty, progressed to half tandem with eyes closed x 30 seconds alternating which foot was forward.  Performed compliant/ pillow standing with head motion with lenses donned and without lenses (has double vision and notes increased sway).  Performed compliant standing with feet apart + eyes closed x 30 seconds with increased sway.  Standing marching tasks near countertop with CGA for safety.  Backwards walking along countertop with ataxia with min A.   Standing alternating foot taps to 6" step x 10 reps, then moving R LE between 6" and 12" objects with R UE support from railing.   Sit<>stand with eyes closed x 10 reps without loss of balance.             PT Education - 01/22/19 1215    Education Details  HEP established:  See patient instructions    Person(s) Educated  Patient    Methods  Explanation;Demonstration;Handout    Comprehension  Verbalized understanding;Returned demonstration       PT Short Term Goals - 01/17/19 1619      PT SHORT TERM GOAL #1   Title  patient to be independent with initial HEP    Time  4    Period  Weeks    Status  New    Target Date  02/14/19      PT SHORT TERM GOAL #2   Title  patient to improve gait speed with no AD to >/= 2.62 ft/sec    Time  4    Period  Weeks    Status  New    Target Date  02/14/19      PT SHORT TERM GOAL #3   Title  patient to improve Berg to >/= 48/56 demonstrating improved balance    Time  4    Period  Weeks    Status  New    Target Date  02/14/19      PT SHORT TERM GOAL #4   Title  patient to improve FGA to >/= 16/30    Time  4    Period  Weeks    Status  New    Target Date  02/14/19      PT SHORT TERM GOAL #5   Title  patient to ambulate up/down 1 flight of steps with single to no handrail and reciprocal pattern    Time  4    Period  Weeks    Status  New    Target Date  02/14/19        PT Long Term Goals - 01/17/19 1621      PT LONG TERM GOAL #1   Title  patient to be independent with advanced HEP    Time  8    Period  Weeks    Status  New    Target Date  03/21/19      PT LONG TERM GOAL #2   Title  patient to improve Berg to >/= 52/56    Time  8    Period  Weeks    Status  New    Target Date  03/21/19      PT LONG TERM GOAL #3  Title  patient to improve FGA to >/= 20/30    Time  8    Period  Weeks    Status  New    Target Date  03/21/19      PT LONG TERM GOAL #4   Title  patient to demonstrate gait over various surfaces and  inclines with Mod I without LOB with LRAD for >/= 500 feet    Time  8    Period  Weeks    Status  New    Target Date  03/21/19      PT LONG TERM GOAL #5   Title  patient to improve gait speed to >/= 3.5 ft/sec    Time  8    Period  Weeks    Status  New    Target Date  03/21/19            Plan - 01/22/19 1229    Clinical Impression Statement  A home program was established today working on compliant surface standing, narrowing base of support and introducing head motion into activties.  PT to continue to work on balance and dynamic gait activities in order to improve functional moblity.    PT Treatment/Interventions  ADLs/Self Care Home Management;Cryotherapy;Electrical Stimulation;Functional mobility training;Stair training;Gait training;DME Instruction;Moist Heat;Therapeutic activities;Therapeutic exercise;Balance training;Neuromuscular re-education;Patient/family education;Orthotic Fit/Training;Passive range of motion;Manual techniques;Taping;Vestibular    PT Next Visit Plan  Inquire about HEP; dynamic gait (backwards walking, sidestepping, etc); single leg stance activities near support; coordination activities R LE    Consulted and Agree with Plan of Care  Patient       Patient will benefit from skilled therapeutic intervention in order to improve the following deficits and impairments:  Abnormal gait, Decreased balance, Decreased mobility, Decreased strength, Decreased coordination, Difficulty walking, Decreased safety awareness  Visit Diagnosis: Other lack of coordination  Other abnormalities of gait and mobility  Unsteadiness on feet     Problem List Patient Active Problem List   Diagnosis Date Noted  . Vertebral artery dissection ([place])   . Elevated blood pressure reading   . Major depressive disorder   . Panic disorder   . CVA (cerebral vascular accident) ([place]) 01/02/2019  . Acute ischemic stroke ([place]) 01/02/2019  . S/P cesarean section 05/05/2016  . S/P  tubal ligation 05/05/2016  . Term pregnancy, repeat 05/04/2016  . Unspecified vitamin D deficiency 12/18/2012  . ADHD (attention deficit hyperactivity disorder) 11/26/2011  . Dysthymia 11/26/2011    Milad Bublitz, PT 01/22/2019, 12:32 PM  [facility] [address] [place], [place], [address] Phone: [phone]   Fax:  [phone]  Name: BEULAH CAPOBIANCO MRN: [id number] Date of Birth: 04/16/1981

## 2019-01-22 NOTE — Patient Instructions (Signed)
Access Code: [email or web address]  URL: [email or web address]  Date: 01/22/2019  Prepared by: Rudell Cobb   Program Notes  Begin program having a family member stand nearby for safety. As you are able to perform without loss of balance, you can begin to try on your own.   Exercises Half Tandem Stance Balance with Eyes Closed - 3 reps - 1 sets - 30 hold - 1x daily - 7x weekly Wide Stance with Head Rotation on Foam Pad - 10 reps - 1 sets - 1x daily - 7x weekly Wide Stance with Head Nods on Foam Pad - 10 reps - 1 sets - 1x daily - 7x weekly Wide Stance with Eyes Closed on Foam Pad - 3 reps - 1 sets - 30 seconds hold - 1x daily - 7x weekly Foot Taps on Steps of Various Heights and Placements (BKA) - 10 reps - 3 sets - 1x daily - 7x weekly Standing Marching - 10 reps - 1 sets - 1x daily - 7x weekly

## 2019-01-24 ENCOUNTER — Ambulatory Visit: Payer: 59 | Admitting: Physical Therapy

## 2019-01-24 ENCOUNTER — Ambulatory Visit: Payer: 59 | Admitting: Occupational Therapy

## 2019-01-24 ENCOUNTER — Encounter: Payer: Self-pay | Admitting: Physical Therapy

## 2019-01-24 DIAGNOSIS — R278 Other lack of coordination: Secondary | ICD-10-CM

## 2019-01-24 DIAGNOSIS — R2681 Unsteadiness on feet: Secondary | ICD-10-CM

## 2019-01-24 DIAGNOSIS — R2689 Other abnormalities of gait and mobility: Secondary | ICD-10-CM

## 2019-01-24 DIAGNOSIS — R41842 Visuospatial deficit: Secondary | ICD-10-CM

## 2019-01-24 DIAGNOSIS — I7774 Dissection of vertebral artery: Secondary | ICD-10-CM | POA: Diagnosis not present

## 2019-01-24 DIAGNOSIS — M6281 Muscle weakness (generalized): Secondary | ICD-10-CM

## 2019-01-24 NOTE — Therapy (Signed)
[facility] [address] [place], [place], [address] Phone: [phone]   Fax:  [phone]  Occupational Therapy Treatment  Patient Details  Name: [name] MRN: [id number] Date of Birth: 20-Jul-1981 Referring Provider (OT): Dr. Letta Pate   Encounter Date: 01/24/2019  OT End of Session - 01/24/19 1155    Visit Number  2    Number of Visits  17    Date for OT Re-Evaluation  03/18/19    Authorization Type  U[employer] (23 visit limit for OT)    Authorization - Visit Number  2    Authorization - Number of Visits  23    OT Start Time  6962    OT Stop Time  1100    OT Time Calculation (min)  45 min    Activity Tolerance  Patient tolerated treatment well    Behavior During Therapy  [facility] for tasks assessed/performed       Past Medical History:  Diagnosis Date  . ADD (attention deficit disorder)   . Exercise-induced asthma   . Hx of varicella   . Kidney stones   . S/P cesarean section 05/05/2016  . S/P tubal ligation 05/05/2016  . Term pregnancy, repeat 05/04/2016    Past Surgical History:  Procedure Laterality Date  . BACK SURGERY     scoliosis age 29  . BILATERAL SALPINGECTOMY Bilateral 05/05/2016   Procedure: BILATERAL SALPINGECTOMY;  Surgeon: Janyth Contes, MD;  Location: [facility];  Service: Obstetrics;  Laterality: Bilateral;  . CESAREAN SECTION    . CESAREAN SECTION N/A 05/05/2016   Procedure: CESAREAN SECTION;  Surgeon: Janyth Contes, MD;  Location: [facility];  Service: Obstetrics;  Laterality: N/A;  . CHOLECYSTECTOMY    . HAND SURGERY    . HERNIA REPAIR    . KIDNEY STONE SURGERY     R--surgery; L lithotripsy.  Dr. Gaynelle Arabian    There were no vitals filed for this visit.  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 PMH:  ADHD, panic attacks, ex induced asthma    Limitations  **fall risk, no driving, diplopia    Currently in Pain?  No/denies        BP = 142/98 See below for HEP's issued. Pt demo each                   OT Education - 01/24/19 1154    Education Details  Coordination HEP, Putty HEP, diplopia HEP    Person(s) Educated  Patient    Methods  Explanation;Demonstration;Handout    Comprehension  Verbalized understanding;Returned demonstration       OT Short Term Goals - 01/24/19 1158      OT SHORT TERM GOAL #1   Title  Independent w/ coordination HEP - 02/15/19    Time  4    Period  Weeks    Status  On-going      OT SHORT TERM GOAL #2   Title  Independent with diplopia HEP     Time  4    Period  Weeks    Status  On-going      OT SHORT TERM GOAL #3   Title  Improve fine motor coordination Rt hand as evidenced by performing 9 hole peg test in 42 sec or under    Baseline  49.41 sec    Time  4    Period  Weeks    Status  New      OT SHORT TERM GOAL #4   Title  Pt to make snack from standing level w/ supervision    Time  4    Period  Weeks    Status  New      OT SHORT TERM GOAL #5   Title  Pt to perform light household tasks (laundry, Manufacturing engineer) w/ counter support x 10 min. w/o rest or LOB    Time  4    Period  Weeks    Status  New      OT SHORT TERM GOAL #6   Title  Pt to read 1 line of enlarged text w/ improvements in diplopia per pt report    Time  4    Period  Weeks    Status  New        OT Long Term Goals - 01/17/19 1418      OT LONG TERM GOAL #1   Title  Independent with updated HEP prn - 03/18/19    Time  8    Period  Weeks    Status  New      OT LONG TERM GOAL #2   Title  Pt to return to light cooking tasks for 20 min. w/ distant supervision    Time  8    Period  Weeks    Status  New      OT LONG TERM GOAL #3   Title  Pt to return to moderately complex household tasks w/ distant supervision    Time  8    Period   Weeks    Status  New      OT LONG TERM GOAL #4   Title  Pt to improve grip strength Rt dominant hand by 10 lbs     Baseline  65 lbs    Time  8    Period  Weeks    Status  New      OT LONG TERM GOAL #5   Title  Pt to improve fine motor coordination as evidenced by reducing speed on 9 hole peg test to 35 sec. or less    Baseline  49.41 sec    Time  8    Period  Weeks    Status  New      Long Term Additional Goals   Additional Long Term Goals  Yes      OT LONG TERM GOAL #6   Title  Pt able to read paragraph of text w/ minimal diplopia    Time  8    Period  Weeks    Status  New            Plan - 01/24/19 1159    Clinical Impression Statement  Pt tolerating HEP well.     Occupational Profile and client history currently impacting functional performance  Pt is wife and mother of 4 children. Current deficits affect ability to perform ADLS, activities, life roles. PMH: ADHD, panic attacks, exercise induced asthma    Occupational performance deficits (Please refer to evaluation for details):  ADL's;IADL's;Leisure;Other    Body Structure / Function / Physical Skills  UE functional use;ROM;FMC;Mobility;Vision;Dexterity;Strength;Endurance;Coordination;IADL;Decreased knowledge of precautions;Decreased knowledge of use of DME    Rehab Potential  Good    OT Frequency  2x / week    OT Duration  8 weeks    OT  Treatment/Interventions  Self-care/ADL training;Therapeutic exercise;Visual/perceptual remediation/compensation;Coping strategies training;Neuromuscular education;Patient/family education;Therapeutic activities;Functional Mobility Training;DME and/or AE instruction;Passive range of motion    Plan  review HEP, continue coordination and visual scanning as able    Consulted and Agree with Plan of Care  Patient       Patient will benefit from skilled therapeutic intervention in order to improve the following deficits and impairments:  Body Structure / Function / Physical  Skills  Visit Diagnosis: Other lack of coordination  Visuospatial deficit  Muscle weakness (generalized)    Problem List Patient Active Problem List   Diagnosis Date Noted  . Vertebral artery dissection ([place])   . Elevated blood pressure reading   . Major depressive disorder   . Panic disorder   . CVA (cerebral vascular accident) ([place]) 01/02/2019  . Acute ischemic stroke ([place]) 01/02/2019  . S/P cesarean section 05/05/2016  . S/P tubal ligation 05/05/2016  . Term pregnancy, repeat 05/04/2016  . Unspecified vitamin D deficiency 12/18/2012  . ADHD (attention deficit hyperactivity disorder) 11/26/2011  . Dysthymia 11/26/2011    Carey Bullocks, OTR/L 01/24/2019, 12:02 PM  [facility] [address] [place], [place], [address] Phone: [phone]   Fax:  [phone]  Name: [name] MRN: [id number] Date of Birth: [birth year]

## 2019-01-24 NOTE — Therapy (Signed)
[facility] [address] [place], [place], [address] Phone: [phone]   Fax:  [phone]  Physical Therapy Treatment  Patient Details  Name: [name] MRN: [id number] Date of Birth: 1981/10/07 Referring Provider (PT): Dr. [name] Penna   Encounter Date: 01/24/2019  PT End of Session - 01/24/19 0933    Visit Number  3    Number of Visits  17    Date for PT Re-Evaluation  03/21/19    Authorization Type  UHC    PT Start Time  0931    PT Stop Time  1015    PT Time Calculation (min)  44 min    Activity Tolerance  Patient tolerated treatment well    Behavior During Therapy  [facility] for tasks assessed/performed       Past Medical History:  Diagnosis Date  . ADD (attention deficit disorder)   . Exercise-induced asthma   . Hx of varicella   . Kidney stones   . S/P cesarean section 05/05/2016  . S/P tubal ligation 05/05/2016  . Term pregnancy, repeat 05/04/2016    Past Surgical History:  Procedure Laterality Date  . BACK SURGERY     scoliosis age 71  . BILATERAL SALPINGECTOMY Bilateral 05/05/2016   Procedure: BILATERAL SALPINGECTOMY;  Surgeon: Janyth Contes, MD;  Location: [facility];  Service: Obstetrics;  Laterality: Bilateral;  . CESAREAN SECTION    . CESAREAN SECTION N/A 05/05/2016   Procedure: CESAREAN SECTION;  Surgeon: Janyth Contes, MD;  Location: [facility];  Service: Obstetrics;  Laterality: N/A;  . CHOLECYSTECTOMY    . HAND SURGERY    . HERNIA REPAIR    . KIDNEY STONE SURGERY     R--surgery; L lithotripsy.  Dr. Gaynelle Arabian    There were no vitals filed for this visit.  Subjective Assessment - 01/24/19 0933    Subjective  feels like she is doing better - has been compliant with HEP    Pertinent History  CVA, ADHD/ADD, panic attacks, exercise induced asthma    Patient Stated Goals  "Using my Right side" - feels as if it is weaker and discoordinated    Currently in  Pain?  No/denies    Pain Score  0-No pain                            Balance Exercises - 01/24/19 1110      Balance Exercises: Standing   Rockerboard  Anterior/posterior;Lateral   weight shifting; static stance with ball toss   Tandem Gait  Forward;Intermittent upper extremity support;4 reps   // bars   Retro Gait  4 reps;Upper extremity support   cueing for step length and posture   Sidestepping  4 reps   cueing for coordination and motor control   Other Standing Exercises  fwd toe taps to 6" step; progression to standing on foam with forward toe taps - cueing for weight shift prior to hip flexion; lateral taps while standing on foam with cueing for weight shift          PT Short Term Goals - 01/17/19 1619      PT SHORT TERM GOAL #1   Title  patient to be independent with initial HEP    Time  4    Period  Weeks    Status  New    Target Date  02/14/19      PT SHORT TERM GOAL #2  Title  patient to improve gait speed with no AD to >/= 2.62 ft/sec    Time  4    Period  Weeks    Status  New    Target Date  02/14/19      PT SHORT TERM GOAL #3   Title  patient to improve Berg to >/= 48/56 demonstrating improved balance    Time  4    Period  Weeks    Status  New    Target Date  02/14/19      PT SHORT TERM GOAL #4   Title  patient to improve FGA to >/= 16/30    Time  4    Period  Weeks    Status  New    Target Date  02/14/19      PT SHORT TERM GOAL #5   Title  patient to ambulate up/down 1 flight of steps with single to no handrail and reciprocal pattern    Time  4    Period  Weeks    Status  New    Target Date  02/14/19        PT Long Term Goals - 01/17/19 1621      PT LONG TERM GOAL #1   Title  patient to be independent with advanced HEP    Time  8    Period  Weeks    Status  New    Target Date  03/21/19      PT LONG TERM GOAL #2   Title  patient to improve Berg to >/= 52/56    Time  8    Period  Weeks    Status  New     Target Date  03/21/19      PT LONG TERM GOAL #3   Title  patient to improve FGA to >/= 20/30    Time  8    Period  Weeks    Status  New    Target Date  03/21/19      PT LONG TERM GOAL #4   Title  patient to demonstrate gait over various surfaces and inclines with Mod I without LOB with LRAD for >/= 500 feet    Time  8    Period  Weeks    Status  New    Target Date  03/21/19      PT LONG TERM GOAL #5   Title  patient to improve gait speed to >/= 3.5 ft/sec    Time  8    Period  Weeks    Status  New    Target Date  03/21/19            Plan - 01/24/19 0934    Clinical Impression Statement  PT session today focusing on weight shifting, balance, and coordination on compliant surfaces. PT providing cueing throughout for motor control to slow movements and for full weight shifting - with this patient demonstrating improved balance and control. PT assessing BP at end of session with reading of 156/103, HR 82 - patient informing PT that she did not take BP meds this morning. PT provinding ample time for rest break - BP dropping to 142/98 after seated rest break. Will continue to progress towards goals.     PT Treatment/Interventions  ADLs/Self Care Home Management;Cryotherapy;Electrical Stimulation;Functional mobility training;Stair training;Gait training;DME Instruction;Moist Heat;Therapeutic activities;Therapeutic exercise;Balance training;Neuromuscular re-education;Patient/family education;Orthotic Fit/Training;Passive range of motion;Manual techniques;Taping;Vestibular    PT Next Visit Plan  dynamic gait (backwards walking, sidestepping, etc); single leg  stance activities near support; coordination activities R LE - weight shifitng activities with dual task    Consulted and Agree with Plan of Care  Patient       Patient will benefit from skilled therapeutic intervention in order to improve the following deficits and impairments:  Abnormal gait, Decreased balance, Decreased mobility,  Decreased strength, Decreased coordination, Difficulty walking, Decreased safety awareness  Visit Diagnosis: Unsteadiness on feet  Other lack of coordination  Other abnormalities of gait and mobility     Problem List Patient Active Problem List   Diagnosis Date Noted  . Vertebral artery dissection ([place])   . Elevated blood pressure reading   . Major depressive disorder   . Panic disorder   . CVA (cerebral vascular accident) ([place]) 01/02/2019  . Acute ischemic stroke ([place]) 01/02/2019  . S/P cesarean section 05/05/2016  . S/P tubal ligation 05/05/2016  . Term pregnancy, repeat 05/04/2016  . Unspecified vitamin D deficiency 12/18/2012  . ADHD (attention deficit hyperactivity disorder) 11/26/2011  . Dysthymia 11/26/2011     Lanney Gins, PT, DPT Supplemental Physical Therapist 01/24/19 11:13 AM Pager: [phone] Office: [facility] [address] [place], [place], [address] Phone: [phone]   Fax:  [phone]  Name: LARKEN URIAS MRN: [id number] Date of Birth: 1981/05/10

## 2019-01-24 NOTE — Patient Instructions (Signed)
  Coordination Activities  Perform the following activities for 15 minutes 1-2 times per day with right hand(s). RELAX SHOULDER!!!   Toss ball with both hands. (As you get better, just toss and catch w/ Rt hand)   Toss balloon in air with Rt hand  Flip cards 1 at a time as fast as you can.  Deal cards with your thumb (Hold deck in hand and push card off top with thumb).  Rotate one card in hand (clockwise and counter-clockwise x 3 reps each way).  Pick up pennies and stack. Do 3 stacks of 5  Pick up coins one at a time until you get 5 in your hand, then move coins from palm to fingertips to stack one at a time.   Practice writing and typing.   1. Grip Strengthening (Resistive Putty)   Squeeze putty using thumb and all fingers. Repeat _20___ times. Do __2__ sessions per day.   2. Roll putty into tube on table and pinch between first 2 fingers and thumb x 10 reps. Do 2 sessions per day.            Diplopia HEP:  Perform at least 3 times per day. Stop if your eye becomes fatigued or hurts and try again later.  1. Hold a small object/card in front of you.  Hold it in the middle at arm's length away.    2. Cover your LEFT eye and look at the object with your RIGHT eye.  3. Slowly move the object side to side in front of you while continuing to watch it with your RIGHT eye.  4.  Remember to keep your head still and only move your eye.  5.  Repeat 5-10 times.  6.  Then, move object up and down while watching it 5-10 times.  7. Cover your RIGHT eye and look at the object with your LEFT eye while you repeat #1-6 above.  8.  Now, uncover both eyes and try to focus on the object while holding it in the middle.  Try to make it 1 image.   9.  If you can, try to hold it for 10-30 sec increasing as able.    10.  Once you can make the image 1 for at least 30 sec in the middle, repeat #1-6 above with both eyes moving slowly and only in the range that you can keep the  image 1.

## 2019-01-25 ENCOUNTER — Encounter: Payer: 59 | Attending: Registered Nurse | Admitting: Registered Nurse

## 2019-01-25 DIAGNOSIS — I7774 Dissection of vertebral artery: Secondary | ICD-10-CM | POA: Insufficient documentation

## 2019-01-25 DIAGNOSIS — F410 Panic disorder [episodic paroxysmal anxiety] without agoraphobia: Secondary | ICD-10-CM | POA: Insufficient documentation

## 2019-01-25 DIAGNOSIS — H9312 Tinnitus, left ear: Secondary | ICD-10-CM | POA: Insufficient documentation

## 2019-01-25 DIAGNOSIS — I10 Essential (primary) hypertension: Secondary | ICD-10-CM | POA: Insufficient documentation

## 2019-01-25 NOTE — Telephone Encounter (Signed)
Patient was a no show for today's appointment

## 2019-01-30 ENCOUNTER — Ambulatory Visit: Payer: 59 | Admitting: Occupational Therapy

## 2019-01-30 DIAGNOSIS — R278 Other lack of coordination: Secondary | ICD-10-CM

## 2019-01-30 DIAGNOSIS — I7774 Dissection of vertebral artery: Secondary | ICD-10-CM | POA: Diagnosis not present

## 2019-01-30 DIAGNOSIS — R41842 Visuospatial deficit: Secondary | ICD-10-CM

## 2019-01-30 NOTE — Therapy (Signed)
[facility] [address] [place], [place], [address] Phone: [phone]   Fax:  [phone]  Occupational Therapy Treatment  Patient Details  Name: [name] MRN: [id number] Date of Birth: [birth year] Referring Provider (OT): Dr. Letta Pate   Encounter Date: 01/30/2019  OT End of Session - 01/30/19 0926    Visit Number  3    Number of Visits  17    Date for OT Re-Evaluation  03/18/19    Authorization Type  UHC (23 visit limit for OT)    Authorization - Visit Number  3    Authorization - Number of Visits  23    OT Start Time  0845    OT Stop Time  0925    OT Time Calculation (min)  40 min    Activity Tolerance  Patient tolerated treatment well    Behavior During Therapy  [facility] for tasks assessed/performed       Past Medical History:  Diagnosis Date  . ADD (attention deficit disorder)   . Exercise-induced asthma   . Hx of varicella   . Kidney stones   . S/P cesarean section 05/05/2016  . S/P tubal ligation 05/05/2016  . Term pregnancy, repeat 05/04/2016    Past Surgical History:  Procedure Laterality Date  . BACK SURGERY     scoliosis age 16  . BILATERAL SALPINGECTOMY Bilateral 05/05/2016   Procedure: BILATERAL SALPINGECTOMY;  Surgeon: Janyth Contes, MD;  Location: [facility];  Service: Obstetrics;  Laterality: Bilateral;  . CESAREAN SECTION    . CESAREAN SECTION N/A 05/05/2016   Procedure: CESAREAN SECTION;  Surgeon: Janyth Contes, MD;  Location: [facility];  Service: Obstetrics;  Laterality: N/A;  . CHOLECYSTECTOMY    . HAND SURGERY    . HERNIA REPAIR    . KIDNEY STONE SURGERY     R--surgery; L lithotripsy.  Dr. Gaynelle Arabian    There were no vitals filed for this visit.  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 PMH: ADHD, panic attacks, ex induced asthma    Limitations  **fall risk, no driving, diplopia    Currently in Pain?  No/denies                   OT Treatments/Exercises (OP) - 01/30/19 0001      Visual/Perceptual Exercises   Copy this Image  Pegboard    Pegboard  Pt copying small peg design for visual scanning (w/o taped glasses) and for coordination Rt hand - Pt w/ only min difficulty Rt hand and some diplopia but could decrease w/ head tilt    Scanning  Environmental;Tabletop    Scanning - Environmental  Walking w/ hand held assist while scanning for items in environment w/ diplopia (but pt reports now overlapping images vs. 2 separate images)    Scanning - Tabletop  Letter cancellation (43M print size) for tabletop scanning: 100% accuracy. Pt reports increased difficulty further down page (possibly d/t eye fatigue and/or further head tilt causing more diplopia)                OT Short Term Goals - 01/24/19 1158      OT SHORT TERM GOAL #1   Title  Independent w/ coordination HEP - 02/15/19    Time  4  Period  Weeks    Status  On-going      OT SHORT TERM GOAL #2   Title  Independent with diplopia HEP     Time  4    Period  Weeks    Status  On-going      OT SHORT TERM GOAL #3   Title  Improve fine motor coordination Rt hand as evidenced by performing 9 hole peg test in 42 sec or under    Baseline  49.41 sec    Time  4    Period  Weeks    Status  New      OT SHORT TERM GOAL #4   Title  Pt to make snack from standing level w/ supervision    Time  4    Period  Weeks    Status  New      OT SHORT TERM GOAL #5   Title  Pt to perform light household tasks (laundry, Manufacturing engineer) w/ counter support x 10 min. w/o rest or LOB    Time  4    Period  Weeks    Status  New      OT SHORT TERM GOAL #6   Title  Pt to read 1 line of enlarged text w/ improvements in diplopia per pt report    Time  4    Period  Weeks    Status  New         OT Long Term Goals - 01/30/19 2025      OT LONG TERM GOAL #1   Title  Independent with updated HEP prn - 03/18/19    Time  8    Period  Weeks    Status  New      OT LONG TERM GOAL #2   Title  Pt to return to light cooking tasks for 20 min. w/ distant supervision    Time  8    Period  Weeks    Status  New      OT LONG TERM GOAL #3   Title  Pt to return to moderately complex household tasks w/ distant supervision    Time  8    Period  Weeks    Status  New      OT LONG TERM GOAL #4   Title  Pt to improve grip strength Rt dominant hand by 10 lbs     Baseline  65 lbs    Time  8    Period  Weeks    Status  New      OT LONG TERM GOAL #5   Title  Pt to improve fine motor coordination Rt hand as evidenced by reducing speed on 9 hole peg test to 35 sec. or less    Baseline  49.41 sec    Time  8    Period  Weeks    Status  New      OT LONG TERM GOAL #6   Title  Pt able to read paragraph of text w/ minimal diplopia    Time  8    Period  Weeks    Status  New            Plan - 01/30/19 4270    Clinical Impression Statement  Pt gradually progressing w/ vision and coordination    Occupational Profile and client history currently impacting functional performance  Pt is wife and mother of 4 children. Current deficits affect ability to perform ADLS, activities, life roles.  PMH: ADHD, panic attacks, exercise induced asthma    Occupational performance deficits (Please refer to evaluation for details):  ADL's;IADL's;Leisure;Other    Body Structure / Function / Physical Skills  UE functional use;ROM;FMC;Mobility;Vision;Dexterity;Strength;Endurance;Coordination;IADL;Decreased knowledge of precautions;Decreased knowledge of use of DME    Rehab Potential  Good    OT Frequency  2x / week    OT Duration  8 weeks    OT Treatment/Interventions  Self-care/ADL training;Therapeutic exercise;Visual/perceptual remediation/compensation;Coping strategies training;Neuromuscular  education;Patient/family education;Therapeutic activities;Functional Mobility Training;DME and/or AE instruction;Passive range of motion    Plan  light IADLS (Simulate laundry task, snack prep), enlarged print reading practice    Consulted and Agree with Plan of Care  Patient       Patient will benefit from skilled therapeutic intervention in order to improve the following deficits and impairments:  Body Structure / Function / Physical Skills  Visit Diagnosis: Other lack of coordination  Visuospatial deficit    Problem List Patient Active Problem List   Diagnosis Date Noted  . Vertebral artery dissection ([place])   . Elevated blood pressure reading   . Major depressive disorder   . Panic disorder   . CVA (cerebral vascular accident) ([place]) 01/02/2019  . Acute ischemic stroke ([place]) 01/02/2019  . S/P cesarean section 05/05/2016  . S/P tubal ligation 05/05/2016  . Term pregnancy, repeat 05/04/2016  . Unspecified vitamin D deficiency 12/18/2012  . ADHD (attention deficit hyperactivity disorder) 11/26/2011  . Dysthymia 11/26/2011    Carey Bullocks, OTR/L 01/30/2019, 9:36 AM  [facility] [address] [place], [place], [address] Phone: [phone]   Fax:  [phone]  Name: [name] MRN: [id number] Date of Birth: [birth year]

## 2019-02-02 ENCOUNTER — Telehealth: Payer: Self-pay

## 2019-02-02 ENCOUNTER — Ambulatory Visit: Payer: 59 | Admitting: Physical Therapy

## 2019-02-02 ENCOUNTER — Telehealth: Payer: Self-pay | Admitting: Family Medicine

## 2019-02-02 ENCOUNTER — Encounter: Payer: Self-pay | Admitting: Physical Therapy

## 2019-02-02 ENCOUNTER — Ambulatory Visit: Payer: 59 | Admitting: Occupational Therapy

## 2019-02-02 VITALS — BP 145/102 | HR 86

## 2019-02-02 DIAGNOSIS — R2689 Other abnormalities of gait and mobility: Secondary | ICD-10-CM

## 2019-02-02 NOTE — Telephone Encounter (Signed)
Patient has been rescheduled.

## 2019-02-02 NOTE — Patient Instructions (Signed)
Hemorrhagic Stroke    A hemorrhagic stroke is the sudden death of brain tissue that occurs when a blood vessel in the brain leaks or bursts (ruptures). When this happens, certain areas of the brain do not get enough oxygen, and blood builds up and presses on certain areas of the brain (hemorrhage). Lack of oxygen and pressure from hemorrhaging can lead to brain damage.  There are two major types of hemorrhagic stroke, depending on where bleeding occurs. If bleeding occurs within the brain tissue, the condition is called an intracerebral hemorrhage. If bleeding occurs in the area between the brain and the membrane that covers the brain (subarachnoid space), the condition is called a subarachnoid hemorrhage. Hemorrhagic stroke is a medical emergency. It can cause temporary or permanent brain damage and loss of brain function.  What are the causes?  This condition is caused by a blood vessel leaking or rupturing, which may be the result of:  · Part of a weakened blood vessel wall bulging or ballooning out (cerebral aneurysm).  · A hardened, thin blood vessel cracking open and allowing blood to leak out. Blood vessels may become hardened and thin due to plaque buildup.  · Tangled blood vessels in the brain (brain arteriovenous malformation).  · Protein buildup on artery walls in the brain (amyloid angiopathy).  · Inflamed blood vessels (vasculitis).  · A tumor in the brain.  · High blood pressure (hypertension).  What increases the risk?  The following factors may make you more likely to develop this condition:  · Hypertension.  · Having abnormal blood vessels present since birth (congenital abnormality).  · Bleeding disorders, such as hemophilia, sickle cell disease, or liver disease.  · The blood becoming too thin while taking blood thinners (anticoagulants).  · Aging.  · Moderate or heavy alcohol use.  · Using drugs, such as cocaine or methamphetamines.  What are the signs or symptoms?  Symptoms of this condition  usually appear suddenly, and may include:  · Weakness or numbness of the face, arm, or leg, especially on one side of the body.  · Confusion.  · Difficulty speaking (aphasia) or understanding speech.  · Difficulty seeing out of one or both eyes.  · Difficulty walking or moving the arms or legs.  · Dizziness.  · Loss of balance or coordination.  · Seizures.  · A severe headache with no known cause. This headache may feel like the worst headache ever experienced.  How is this diagnosed?  This condition may be diagnosed based on:  · Your symptoms.  · Your medical history.  · A physical exam.  · Tests, including:  ? Blood tests.  ? CT scan.  ? MRI.  · Angiogram. In this procedure, dye is injected through a long, thin tube (catheter) into one of your arteries. Then, X-rays are taken. The X-rays will show whether there is a blockage or a problem in a blood vessel.  How is this treated?  This condition is a medical emergency that must be treated in a hospital immediately. The goals of treatment are to stop bleeding, reduce pressure on the brain, and relieve symptoms. Treatment may include:  · Medicines that:  ? Lower blood pressure (antihypertensives).  ? Relieve pain (analgesics).  ? Relieve nausea or vomiting.  ? Stop or prevent seizures (anticonvulsants).  ? Relieve fever.  ? Prevent blood vessels in the brain from spasming in response to bleeding.  ? Control bleeding in the brain.  · Assisted breathing (ventilation). This   involves using a machine to help you breathe (ventilator).  · Receiving donated blood products through an IV tube (transfusion). You will receive cells that help your blood clot.  · Placement of a tube (shunt) in the brain to relieve pressure.  · Physical, speech, or occupational therapy.  · Surgery to stop bleeding, remove a blood clot or tumor, or reduce pressure.  Treatment depends on the cause, severity, and duration of symptoms.  Medicines and changes to your diet may be used to help treat and  manage risk factors for stroke, such as diabetes and high blood pressure.  Follow these instructions at home:  Activity  · Return to your normal activities as told by your health care provider. Ask your health care provider what activities are safe for you.  · Rest. Rest helps the brain to heal. Make sure you:  ? Get plenty of sleep. Avoid staying up late at night.  ? Keep a consistent sleep schedule. Try to go to sleep and wake up at about the same time every day.  ? Avoid activities that cause physical or mental stress.  General instructions    · Take over-the-counter and prescription medicines only as told by your health care provider.  · Do not drive or operate heavy machinery until your health care provider approves.  · Limit alcohol intake to no more than 1 drink per day for nonpregnant women and 2 drinks per day for men. One drink equals 12 oz of beer, 5 oz of wine, or 1½ oz of hard liquor.  · Use a walker or a cane as told by your health care provider.  · Keep all follow-up visits as told by your health care provider, including visits with therapists. This is important.  How is this prevented?  Your risk of stroke can be decreased by working with your health care provider to treat high blood pressure, high cholesterol, diabetes, heart disease, and obesity. Your risk of stroke can also be decreased by quitting smoking, limiting alcohol, and staying physically active.  If you take the blood thinner warfarin, have your bloodwork monitored frequently by your health care provider.  Contact a health care provider if:  You develop any of the following symptoms:  · Headaches that keep coming back (chronic headaches).  · Nausea.  · Vision problems.  · Increased sensitivity to noise or light.  · Depression or mood swings.  · Anxiety or irritability.  · Memory problems.  · Difficulty concentrating or paying attention.  · Sleep problems.  · Feeling tired all of the time.  Recovery from hemorrhagic stroke varies widely.  Talk with your health care provider about what to expect during your recovery.  Get help right away if:  · You develop symptoms of a hemorrhagic stroke.  · You have a partial or total loss of consciousness.  · You are taking blood thinners and you fall or you experience minor injury (trauma) to the head.  · You have a bleeding disorder and you fall or you experience minor trauma to the head.  These symptoms may represent a serious problem that is an emergency. Do not wait to see if the symptoms will go away. Get medical help right away. Call your local emergency services (911 in the [place] Do not drive yourself to the hospital.  This information is not intended to replace advice given to you by your health care provider. Make sure you discuss any questions you have with your health care provider.

## 2019-02-02 NOTE — Telephone Encounter (Signed)
Denise from [facility] called stating that at the patients last few visits her BP has been elevated. Today 161/105, asking for recommendations and also to reschedule appt missed.  As I was putting message pt called. I recommended patient keep an eye out on her BP and go to the ER or urgent care if start having chest pains, short of breath, headaches or feel bad over the weekend and call PCP on Monday.

## 2019-02-02 NOTE — Therapy (Signed)
[facility] [address] [place], [place], [address] Phone: [phone]   Fax:  [phone]  Physical Therapy Treatment  Patient Details  Name: [name] MRN: [id number] Date of Birth: 1980/12/17 Referring Provider (PT): Dr. [name] Penna   Encounter Date: 02/02/2019    Past Medical History:  Diagnosis Date  . ADD (attention deficit disorder)   . Exercise-induced asthma   . Hx of varicella   . Kidney stones   . S/P cesarean section 05/05/2016  . S/P tubal ligation 05/05/2016  . Term pregnancy, repeat 05/04/2016    Past Surgical History:  Procedure Laterality Date  . BACK SURGERY     scoliosis age 67  . BILATERAL SALPINGECTOMY Bilateral 05/05/2016   Procedure: BILATERAL SALPINGECTOMY;  Surgeon: Janyth Contes, MD;  Location: [facility];  Service: Obstetrics;  Laterality: Bilateral;  . CESAREAN SECTION    . CESAREAN SECTION N/A 05/05/2016   Procedure: CESAREAN SECTION;  Surgeon: Janyth Contes, MD;  Location: [facility];  Service: Obstetrics;  Laterality: N/A;  . CHOLECYSTECTOMY    . HAND SURGERY    . HERNIA REPAIR    . KIDNEY STONE SURGERY     R--surgery; L lithotripsy.  Dr. Gaynelle Arabian    Vitals:   02/02/19 1458 02/02/19 1513  BP: (!) 161/105 (!) 145/102  Pulse: 80 86    Subjective Assessment - 02/02/19 1454    Subjective  "My husband doesnt just let me sit at home."  Reports exercises going well.  Denies falls or changes.    Pertinent History  CVA, ADHD/ADD, panic attacks, exercise induced asthma    Patient Stated Goals  "Using my Right side" - feels as if it is weaker and discoordinated    Currently in Pain?  No/denies       Pt arrived for PT session.  Checked BP upon arrival and 161/105.  Monitored during >20 minute period and continues to be elevated.  See above for vitals.  Pt reports missing her appointment with Dr Aretta Nip.  Called Dr Aretta Nip office and left  message concerning increased BP as well as to reschedule appointment.  Pt educated on stroke signs/symptoms and to call 911 if experiences any of these.       PT Education - 02/02/19 1629    Education Details  Contacting Dr Letta Pate office to reschedule the appointment she missed.  Calling 911 if has any signs/symptoms of stroke.  Monitoring BP at home. Stroke warning signs/symptoms.    Person(s) Educated  Patient    Methods  Explanation;Handout    Comprehension  Verbalized understanding       PT Short Term Goals - 01/17/19 1619      PT SHORT TERM GOAL #1   Title  patient to be independent with initial HEP    Time  4    Period  Weeks    Status  New    Target Date  02/14/19      PT SHORT TERM GOAL #2   Title  patient to improve gait speed with no AD to >/= 2.62 ft/sec    Time  4    Period  Weeks    Status  New    Target Date  02/14/19      PT SHORT TERM GOAL #3   Title  patient to improve Berg to >/= 48/56 demonstrating improved balance    Time  4    Period  Weeks    Status  New  Target Date  02/14/19      PT SHORT TERM GOAL #4   Title  patient to improve FGA to >/= 16/30    Time  4    Period  Weeks    Status  New    Target Date  02/14/19      PT SHORT TERM GOAL #5   Title  patient to ambulate up/down 1 flight of steps with single to no handrail and reciprocal pattern    Time  4    Period  Weeks    Status  New    Target Date  02/14/19        PT Long Term Goals - 01/17/19 1621      PT LONG TERM GOAL #1   Title  patient to be independent with advanced HEP    Time  8    Period  Weeks    Status  New    Target Date  03/21/19      PT LONG TERM GOAL #2   Title  patient to improve Berg to >/= 52/56    Time  8    Period  Weeks    Status  New    Target Date  03/21/19      PT LONG TERM GOAL #3   Title  patient to improve FGA to >/= 20/30    Time  8    Period  Weeks    Status  New    Target Date  03/21/19      PT LONG TERM GOAL #4   Title  patient  to demonstrate gait over various surfaces and inclines with Mod I without LOB with LRAD for >/= 500 feet    Time  8    Period  Weeks    Status  New    Target Date  03/21/19      PT LONG TERM GOAL #5   Title  patient to improve gait speed to >/= 3.5 ft/sec    Time  8    Period  Weeks    Status  New    Target Date  03/21/19              Patient will benefit from skilled therapeutic intervention in order to improve the following deficits and impairments:     Visit Diagnosis: Other abnormalities of gait and mobility     Problem List Patient Active Problem List   Diagnosis Date Noted  . Vertebral artery dissection ([place])   . Elevated blood pressure reading   . Major depressive disorder   . Panic disorder   . CVA (cerebral vascular accident) ([place]) 01/02/2019  . Acute ischemic stroke ([place]) 01/02/2019  . S/P cesarean section 05/05/2016  . S/P tubal ligation 05/05/2016  . Term pregnancy, repeat 05/04/2016  . Unspecified vitamin D deficiency 12/18/2012  . ADHD (attention deficit hyperactivity disorder) 11/26/2011  . Dysthymia 11/26/2011    Narda Bonds, PTA [facility] 02/02/19 4:35 PM Phone: [phone] Fax: [facility] [address] [place], [place], [address] Phone: [phone]   Fax:  [phone]  Name: [name] MRN: [id number] Date of Birth: 1981-04-11

## 2019-02-02 NOTE — Telephone Encounter (Signed)
Pts husband called and said pt was not able to complete therapy today due to elevated blood pressure. They wanted her to reach out to her pcp to see if she could possibly get something for over the weekend. I scheduled pt for a visit on Monday also. She uses [facility].

## 2019-02-05 ENCOUNTER — Ambulatory Visit: Payer: 59 | Admitting: Rehabilitation

## 2019-02-05 ENCOUNTER — Ambulatory Visit: Payer: 59 | Admitting: Occupational Therapy

## 2019-02-05 ENCOUNTER — Encounter: Payer: Self-pay | Admitting: Rehabilitation

## 2019-02-05 ENCOUNTER — Ambulatory Visit (INDEPENDENT_AMBULATORY_CARE_PROVIDER_SITE_OTHER): Payer: 59 | Admitting: Family Medicine

## 2019-02-05 ENCOUNTER — Encounter: Payer: 59 | Admitting: Registered Nurse

## 2019-02-05 ENCOUNTER — Other Ambulatory Visit: Payer: Self-pay

## 2019-02-05 ENCOUNTER — Encounter: Payer: Self-pay | Admitting: Registered Nurse

## 2019-02-05 ENCOUNTER — Encounter: Payer: Self-pay | Admitting: Family Medicine

## 2019-02-05 VITALS — BP 138/92 | HR 75 | Temp 98.2°F | Wt 166.6 lb

## 2019-02-05 VITALS — BP 130/91 | HR 90 | Ht 64.0 in | Wt 166.0 lb

## 2019-02-05 DIAGNOSIS — R278 Other lack of coordination: Secondary | ICD-10-CM

## 2019-02-05 DIAGNOSIS — I10 Essential (primary) hypertension: Secondary | ICD-10-CM

## 2019-02-05 DIAGNOSIS — R2681 Unsteadiness on feet: Secondary | ICD-10-CM

## 2019-02-05 DIAGNOSIS — I7774 Dissection of vertebral artery: Secondary | ICD-10-CM

## 2019-02-05 DIAGNOSIS — R2689 Other abnormalities of gait and mobility: Secondary | ICD-10-CM

## 2019-02-05 DIAGNOSIS — M6281 Muscle weakness (generalized): Secondary | ICD-10-CM

## 2019-02-05 DIAGNOSIS — F410 Panic disorder [episodic paroxysmal anxiety] without agoraphobia: Secondary | ICD-10-CM | POA: Diagnosis not present

## 2019-02-05 DIAGNOSIS — H9312 Tinnitus, left ear: Secondary | ICD-10-CM

## 2019-02-05 DIAGNOSIS — R41842 Visuospatial deficit: Secondary | ICD-10-CM

## 2019-02-05 MED ORDER — HYDROCHLOROTHIAZIDE 12.5 MG PO CAPS: 12.5000 mg | ORAL_CAPSULE | Freq: Every day | ORAL | 1 refills | Status: DC

## 2019-02-05 NOTE — Therapy (Signed)
[facility] [address] [place], [place], [address] Phone: [phone]   Fax:  [phone]  Physical Therapy Treatment  Patient Details  Name: [name] MRN: [id number] Date of Birth: [birth year] Referring Provider (PT): Dr. [name] Penna   Encounter Date: 02/05/2019  PT End of Session - 02/05/19 1251    Visit Number  4    Number of Visits  17    Date for PT Re-Evaluation  03/21/19    Authorization Type  [employer]    PT Start Time  0932    PT Stop Time  1015    PT Time Calculation (min)  43 min    Activity Tolerance  Patient tolerated treatment well    Behavior During Therapy  [facility] for tasks assessed/performed       Past Medical History:  Diagnosis Date  . ADD (attention deficit disorder)   . Exercise-induced asthma   . Hx of varicella   . Kidney stones   . S/P cesarean section 05/05/2016  . S/P tubal ligation 05/05/2016  . Term pregnancy, repeat 05/04/2016    Past Surgical History:  Procedure Laterality Date  . BACK SURGERY     scoliosis age 37  . BILATERAL SALPINGECTOMY Bilateral 05/05/2016   Procedure: BILATERAL SALPINGECTOMY;  Surgeon: Janyth Contes, MD;  Location: [facility];  Service: Obstetrics;  Laterality: Bilateral;  . CESAREAN SECTION    . CESAREAN SECTION N/A 05/05/2016   Procedure: CESAREAN SECTION;  Surgeon: Janyth Contes, MD;  Location: [facility];  Service: Obstetrics;  Laterality: N/A;  . CHOLECYSTECTOMY    . HAND SURGERY    . HERNIA REPAIR    . KIDNEY STONE SURGERY     R--surgery; L lithotripsy.  Dr. Gaynelle Arabian    There were no vitals filed for this visit.  Subjective Assessment - 02/05/19 0930    Subjective  "I haven't checked BP, but doesn't have headache."     Pertinent History  CVA, ADHD/ADD, panic attacks, exercise induced asthma    Patient Stated Goals  "Using my Right side" - feels as if it is weaker and discoordinated    Currently in Pain?   No/denies                       [facility] Adult PT Treatment/Exercise - 02/05/19 0957      Neuro Re-ed    Neuro Re-ed Details   Corner balance: on compliant surface-feet apart EO with head turns x 10 reps, up/down x 10 reps, feet together EO with head motions both directions x 10 reps, feet together EC x 2 sets of 20 secs.  RLE in stance on rocker board with step ups into opposite LE march x 10 reps on each side with single UE support.  On mat on ramp-feet staggered holding x 20 secs each direction progressing to UE flex with opposite head turn x 10 reps with feet in each direction.  Cone taps alternating LE x 10 reps each progressing to crossing over then tapping in front x 10 reps on each side with light support from PT to ensure adequate R lateral weight shifts and cues for improved RLE control, esp upon return to floor from cone.  Ended session with stairs x 5 reps with R rail for coordination and strengthening.  Pt able to perform alternating pattern at mod I level, recommended having S at home due to having approx 20 steps at least the first time she does  to ensure safety.         BP at beginning of session: 136/106 LUE, 130/103 RUE via machine.  Took manually and read 124/94, therefore proceeded with a few corner exercises, re-checked and was 130/98.  Allowed a few more minutes of rest and did a few more activities and BP then read 128/100.  Allowed several more minutes of rest and re-measured at 134/100, therefore session ended and OT notified. Note that during session while checking BP educated on signs/symptoms of CVA so that if she is having symptoms along with elevated BP, to seek medical care immediately.       PT Education - 02/05/19 1250    Education Details  Education on BP during appt and parameters for stopping therapy.     Person(s) Educated  Patient    Methods  Explanation    Comprehension  Verbalized understanding       PT Short Term Goals - 01/17/19 1619       PT SHORT TERM GOAL #1   Title  patient to be independent with initial HEP    Time  4    Period  Weeks    Status  New    Target Date  02/14/19      PT SHORT TERM GOAL #2   Title  patient to improve gait speed with no AD to >/= 2.62 ft/sec    Time  4    Period  Weeks    Status  New    Target Date  02/14/19      PT SHORT TERM GOAL #3   Title  patient to improve Berg to >/= 48/56 demonstrating improved balance    Time  4    Period  Weeks    Status  New    Target Date  02/14/19      PT SHORT TERM GOAL #4   Title  patient to improve FGA to >/= 16/30    Time  4    Period  Weeks    Status  New    Target Date  02/14/19      PT SHORT TERM GOAL #5   Title  patient to ambulate up/down 1 flight of steps with single to no handrail and reciprocal pattern    Time  4    Period  Weeks    Status  New    Target Date  02/14/19        PT Long Term Goals - 01/17/19 1621      PT LONG TERM GOAL #1   Title  patient to be independent with advanced HEP    Time  8    Period  Weeks    Status  New    Target Date  03/21/19      PT LONG TERM GOAL #2   Title  patient to improve Berg to >/= 52/56    Time  8    Period  Weeks    Status  New    Target Date  03/21/19      PT LONG TERM GOAL #3   Title  patient to improve FGA to >/= 20/30    Time  8    Period  Weeks    Status  New    Target Date  03/21/19      PT LONG TERM GOAL #4   Title  patient to demonstrate gait over various surfaces and inclines with Mod I without LOB with LRAD for >/= 500 feet  Time  8    Period  Weeks    Status  New    Target Date  03/21/19      PT LONG TERM GOAL #5   Title  patient to improve gait speed to >/= 3.5 ft/sec    Time  8    Period  Weeks    Status  New    Target Date  03/21/19            Plan - 02/05/19 1251    Clinical Impression Statement  Skilled session limited due to elevated BP during session.  Was able to allow several seated rest breaks and continue with limited balance  exercises to emphasis vestibular system challenges.  Pt goes to [facility] and PCP today to have BP checked.      PT Treatment/Interventions  ADLs/Self Care Home Management;Cryotherapy;Electrical Stimulation;Functional mobility training;Stair training;Gait training;DME Instruction;Moist Heat;Therapeutic activities;Therapeutic exercise;Balance training;Neuromuscular re-education;Patient/family education;Orthotic Fit/Training;Passive range of motion;Manual techniques;Taping;Vestibular    PT Next Visit Plan  Keep an eye on BP-may have to cancel or limit activity, dynamic gait (backwards walking, sidestepping, etc); single leg stance activities near support; coordination activities R LE - weight shifitng activities with dual task    Consulted and Agree with Plan of Care  Patient       Patient will benefit from skilled therapeutic intervention in order to improve the following deficits and impairments:  Abnormal gait, Decreased balance, Decreased mobility, Decreased strength, Decreased coordination, Difficulty walking, Decreased safety awareness  Visit Diagnosis: Other abnormalities of gait and mobility  Other lack of coordination  Unsteadiness on feet  Muscle weakness (generalized)     Problem List Patient Active Problem List   Diagnosis Date Noted  . Vertebral artery dissection ([place])   . Elevated blood pressure reading   . Major depressive disorder   . Panic disorder   . CVA (cerebral vascular accident) ([place]) 01/02/2019  . Acute ischemic stroke ([place]) 01/02/2019  . S/P cesarean section 05/05/2016  . S/P tubal ligation 05/05/2016  . Term pregnancy, repeat 05/04/2016  . Unspecified vitamin D deficiency 12/18/2012  . ADHD (attention deficit hyperactivity disorder) 11/26/2011  . Dysthymia 11/26/2011    Cameron Sprang, PT, MPT [facility] [address] [place], [place], [address] Phone: [phone]   Fax:  [phone] 02/05/19, 12:56 PM  Name:  [name] MRN: [id number] Date of Birth: 10-09-1981

## 2019-02-05 NOTE — Progress Notes (Signed)
Subjective:    Patient ID: [name], female    DOB: [birth year], 38 y.o.   MRN: [id number]  HPI: [name] is a 38 y.o. female who is here for hospital follow up appointment. She presented to [facility] via EMS on 01/02/2019 her husband reported she was complaining days to weeks of right sided neck pain,also  she has complaints of dizziness, anxiety and ataxic gait. CT and MRI ordered.  CT Angio Neck W and or Wo Contrast. CT HEAD IMPRESSION  1. 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 Associated trace petechial hemorrhage at the occipital poles without frank hemorrhagic transformation. 2. Associated mass effect within the right posterior fossa with partial effacement of the right basilar cisterns and fourth ventricle. No obstructive hydrocephalus at this time.  CTA HEAD AND NECK IMPRESSION  1. Acute right vertebral artery dissection, involving the right V3 segment at the level of the right C1 transverse foramen, with probable small dissection flap just distally at the right V3/V4 junction. Associated moderate stenosis. 2. 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 3. Otherwise negative CTA of the head and neck. Normal left vertebral artery. 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 CT Head WO Contrast:  IMPRESSION: 1. Unchanged appearance of posterior circulation infarcts involving the right cerebellum, both thalami and both occipital lobes. 2. Unchanged posterior fossa edema with narrowing of the basal cisterns and fourth ventricle without hydrocephalus. MRI Brain WO Contrast:  IMPRESSION: Multifocal areas of acute infarction, predominantly affecting the RIGHT cerebellum reflecting RIGHT vertebral dissection, but also areas of acute infarction elsewhere in the posterior circulation,  notably both thalami, LEFT temporal lobe, and RIGHT occipital lobe reflecting thalamoperforate and BILATERAL PCA territory ischemia, consistent with distal emboli. Abnormal flow void RIGHT vertebral reflecting the previously identified dissection. No features to suggest hemorrhagic transformation. Significant mass effect on the outlet foramina the fourth ventricle without current features of hydrocephalus. Close continued surveillance is warranted. Neurology was consulted,  Per Dr. Leonie Man Note:  01/02/2019  Acute right vertebral artery dissection -> Multifocal areas of acute infarction in posterior circulation. Continue Aspirin daily for stroke prevention.   She was admitted to [facility] on 01/03/2019 and discharged home on 01/16/2019. She is receiving [facility]. Today she denies pain, she reports her left lower extremity with decrease sensation, since hospitalization. Also reports tinnitus in her left ear, at times she has noticed difficulty with hearing in her right ear since CVA, she can't recall if she has told any of the providers, it's not mentioned on the discharge summary. She has an appointment with her PCP today, she was instructed to ask for referral to ENT, she verbalizes understanding.   She rated her pain 0. Also reports a good appetite.   Husband in room.   Pain Inventory Average Pain 0 Pain Right Now 0 My pain is na  In the last 24 hours, has pain interfered with the following? General activity 0 Relation with others 0 Enjoyment of life 0 What TIME of day is your pain at its worst? na Sleep (in general) Good  Pain is worse with: na Pain improves with: na Relief from Meds: na  Mobility how many minutes can you walk? 10 ability to climb steps?  no do you drive?  no  Function I need assistance with the following:  shopping  Neuro/Psych  trouble walking  Prior Studies CT/MRI  Physicians involved in  your care Primary care Dr. Redmond School   Family History  Problem Relation Age of Onset  . Cancer Mother   . Cancer Maternal Aunt   . Cancer Maternal Grandmother    Social History   Socioeconomic History  . Marital status: Married    Spouse name: Not on file  . Number of children: Not on file  . Years of education: Not on file  . Highest education level: Not on file  Occupational History  . Not on file  Social Needs  . Financial resource strain: Not on file  . Food insecurity:    Worry: Not on file    Inability: Not on file  . Transportation needs:    Medical: Not on file    Non-medical: Not on file  Tobacco Use  . Smoking status: Never Smoker  . Smokeless tobacco: Never Used  Substance and Sexual Activity  . Alcohol use: No    Comment: occasional wine  . Drug use: No  . Sexual activity: Yes    Partners: Male    Birth control/protection: Surgical  Lifestyle  . Physical activity:    Days per week: Not on file    Minutes per session: Not on file  . Stress: Not on file  Relationships  . Social connections:    Talks on phone: Not on file    Gets together: Not on file    Attends religious service: Not on file    Active member of club or organization: Not on file    Attends meetings of clubs or organizations: Not on file    Relationship status: Not on file  Other Topics Concern  . Not on file  Social History Narrative  . Not on file   Past Surgical History:  Procedure Laterality Date  . BACK SURGERY     scoliosis age 71  . BILATERAL SALPINGECTOMY Bilateral 05/05/2016   Procedure: BILATERAL SALPINGECTOMY;  Surgeon: Janyth Contes, MD;  Location: [place];  Service: Obstetrics;  Laterality: Bilateral;  . CESAREAN SECTION    . CESAREAN SECTION N/A 05/05/2016   Procedure: CESAREAN SECTION;  Surgeon: Janyth Contes, MD;  Location: [place];  Service: Obstetrics;  Laterality: N/A;  . CHOLECYSTECTOMY    . HAND SURGERY    . HERNIA REPAIR     . KIDNEY STONE SURGERY     R--surgery; L lithotripsy.  Dr. Gaynelle Arabian   Past Medical History:  Diagnosis Date  . ADD (attention deficit disorder)   . Exercise-induced asthma   . Hx of varicella   . Kidney stones   . S/P cesarean section 05/05/2016  . S/P tubal ligation 05/05/2016  . Term pregnancy, repeat 05/04/2016   BP (!) 142/100   Pulse 88   Ht 5\' 4"  (1.626 m)   Wt 166 lb (75.3 kg)   SpO2 95%   BMI 28.49 kg/m   Opioid Risk Score:   Fall Risk Score:  `1  Depression screen PHQ 2/9  Depression screen [facility] 2/9 02/05/2019 06/06/2017  Decreased Interest 0 0  Down, Depressed, Hopeless 0 0  PHQ - 2 Score 0 0    Review of Systems  Constitutional: Negative.   HENT: Negative.   Eyes: Negative.   Respiratory: Negative.   Cardiovascular: Negative.   Gastrointestinal: Negative.   Endocrine: Negative.   Genitourinary: Negative.   Musculoskeletal: Negative.   Skin: Negative.   Allergic/Immunologic: Negative.   Neurological: Negative.   Hematological:  Negative.   Psychiatric/Behavioral: Negative.   All other systems reviewed and are negative.      Objective:   Physical Exam Constitutional:      Appearance: Normal appearance.  Neck:     Musculoskeletal: Normal range of motion and neck supple.  Cardiovascular:     Rate and Rhythm: Normal rate and regular rhythm.     Pulses: Normal pulses.     Heart sounds: Normal heart sounds.  Pulmonary:     Effort: Pulmonary effort is normal.     Breath sounds: Normal breath sounds.  Musculoskeletal:     Comments: Normal Muscle Bulk and Muscle Testing Reveals:  Upper Extremities: Full ROM and Muscle Strength on the Right 4/5 and Left 5/5  Lower Extremities: Full ROM and Muscle Strength 5/5  Arises from Table with Ease Ataxic Gait  Skin:    General: Skin is warm and dry.  Neurological:     Mental Status: She is alert and oriented to person, place, and time.  Psychiatric:        Mood and Affect: Mood normal.        Behavior:  Behavior normal.           Assessment & Plan:  1. Vertebral Artery Dissection: / Per Dr Leonie Man note: Acute right vertebral artery dissection. Multifocal areas of acute infarction in posterior circulation. Continue Aspirin daily for stroke prevention.  2. Essential Hypertension: She has an appointment with PCP today.  3. Tinnitus of left ear/ Hearing Loss: She has a PCP appointment today, she was instructed to ask for ENT referral.  4.Panic Disorder: Continue Alprazolam and Zoloft: PCP Following.  20 minutes of face to face patient care time was spent during this visit. All questions were encouraged and answered.  F/U in 4-6 weeks with Dr. Letta Pate

## 2019-02-05 NOTE — Progress Notes (Signed)
   Subjective:    Patient ID: [name], female    DOB: Jun 24, 1981, 38 y.o.   MRN: [id number]  HPI She is here for follow-up visit.  She was admitted to the hospital in February and found to have vertebral artery dissection and subsequent neurologic deficits from that.  She has been involved in an outpatient rehab program since she left the hospital.  She still does note some slight speech issues as well as with some double vision.  She was also noted to have elevated blood pressure.   Review of Systems     Objective:   Physical Exam Alert and in no distress.  Slight delay of closure of the right eye is noted with some slurring of her speech.  Blood pressure is recorded.       Assessment & Plan:  Essential hypertension - Plan: hydrochlorothiazide (MICROZIDE) 12.5 MG capsule  Dissection of vertebral artery (HCC) I will start her out slowly with HCTZ to see if that will be enough to take care of this.  Explained that we will continue to monitor this.  Recommend weight loss down the road but at the present time most of her and she should be put into rehab.  Recheck here 1 month

## 2019-02-05 NOTE — Therapy (Signed)
[name] [address] [place], [place], [address] Phone: [phone]   Fax:  [phone]  Occupational Therapy Treatment  Patient Details  Name: [name] MRN: [id number] Date of Birth: [birth year] Referring Provider (OT): Dr. Letta Pate   Encounter Date: 02/05/2019  OT End of Session - 02/05/19 1037    Visit Number  4    Number of Visits  17    Date for OT Re-Evaluation  03/18/19    Authorization Type  [employer] (23 visit limit for OT)    Authorization - Visit Number  4    Authorization - Number of Visits  23    OT Start Time  1696    OT Stop Time  1100    OT Time Calculation (min)  45 min    Activity Tolerance  Patient tolerated treatment well    Behavior During Therapy  [facility] for tasks assessed/performed       Past Medical History:  Diagnosis Date  . ADD (attention deficit disorder)   . Exercise-induced asthma   . Hx of varicella   . Kidney stones   . S/P cesarean section 05/05/2016  . S/P tubal ligation 05/05/2016  . Term pregnancy, repeat 05/04/2016    Past Surgical History:  Procedure Laterality Date  . BACK SURGERY     scoliosis age 38  . BILATERAL SALPINGECTOMY Bilateral 05/05/2016   Procedure: BILATERAL SALPINGECTOMY;  Surgeon: Janyth Contes, MD;  Location: [place];  Service: Obstetrics;  Laterality: Bilateral;  . CESAREAN SECTION    . CESAREAN SECTION N/A 05/05/2016   Procedure: CESAREAN SECTION;  Surgeon: Janyth Contes, MD;  Location: [place];  Service: Obstetrics;  Laterality: N/A;  . CHOLECYSTECTOMY    . HAND SURGERY    . HERNIA REPAIR    . KIDNEY STONE SURGERY     R--surgery; L lithotripsy.  Dr. Gaynelle Arabian    There were no vitals filed for this visit.  Subjective Assessment - 02/05/19 1025    Pertinent History  Rt cerebellar and Bil PCA infarcts secondary to Rt verterbral artery dissection on 01/02/19. PMH: ADHD, panic attacks, ex induced asthma    Limitations  **fall risk, no driving, diplopia    Currently in Pain?  No/denies                   OT Treatments/Exercises (OP) - 02/05/19 0001      ADLs   ADL Comments  BP = 130/95      Visual/Perceptual Exercises   Scanning - Tabletop  Word search using strategies w/ min difficulty, occasional cues,  and occasional occlusion of Rt eye needed.  Pt also practiced to Lt and Rt of center of table   Other Exercises  Reading enlarged text w/ increased difficulty lower on the page, but also in center vision - pt has to place paper further up on table.                OT Short Term Goals - 01/24/19 1158      OT SHORT TERM GOAL #1   Title  Independent w/ coordination HEP - 02/15/19    Time  4    Period  Weeks    Status  On-going      OT SHORT TERM GOAL #2   Title  Independent with diplopia HEP     Time  4    Period  Weeks    Status  On-going      OT  SHORT TERM GOAL #3   Title  Improve fine motor coordination Rt hand as evidenced by performing 9 hole peg test in 42 sec or under    Baseline  49.41 sec    Time  4    Period  Weeks    Status  New      OT SHORT TERM GOAL #4   Title  Pt to make snack from standing level w/ supervision    Time  4    Period  Weeks    Status  New      OT SHORT TERM GOAL #5   Title  Pt to perform light household tasks [employer] officer, Manufacturing engineer) w/ counter support x 10 min. w/o rest or LOB    Time  4    Period  Weeks    Status  New      OT SHORT TERM GOAL #6   Title  Pt to read 1 line of enlarged text w/ improvements in diplopia per pt report    Time  4    Period  Weeks    Status  New        OT Long Term Goals - 01/30/19 6387      OT LONG TERM GOAL #1   Title  Independent with updated HEP prn - 03/18/19    Time  8    Period  Weeks    Status  New      OT LONG TERM GOAL #2   Title  Pt to return to light cooking tasks for 20 min. w/ distant supervision    Time  8    Period  Weeks    Status  New      OT  LONG TERM GOAL #3   Title  Pt to return to moderately complex household tasks w/ distant supervision    Time  8    Period  Weeks    Status  New      OT LONG TERM GOAL #4   Title  Pt to improve grip strength Rt dominant hand by 10 lbs     Baseline  65 lbs    Time  8    Period  Weeks    Status  New      OT LONG TERM GOAL #5   Title  Pt to improve fine motor coordination Rt hand as evidenced by reducing speed on 9 hole peg test to 35 sec. or less    Baseline  49.41 sec    Time  8    Period  Weeks    Status  New      OT LONG TERM GOAL #6   Title  Pt able to read paragraph of text w/ minimal diplopia    Time  8    Period  Weeks    Status  New            Plan - 02/05/19 1048    Clinical Impression Statement  Pt adapting better to diplopia. Pt reports more overlapping now vs. 2 separate images    Occupational Profile and client history currently impacting functional performance  Pt is wife and mother of 4 children. Current deficits affect ability to perform ADLS, activities, life roles. PMH: ADHD, panic attacks, exercise induced asthma    Occupational performance deficits (Please refer to evaluation for details):  ADL's;IADL's;Leisure;Other    Body Structure / Function / Physical Skills  UE functional use;ROM;FMC;Mobility;Vision;Dexterity;Strength;Endurance;Coordination;IADL;Decreased knowledge of precautions;Decreased knowledge of use of DME  Rehab Potential  Good    OT Frequency  2x / week    OT Duration  8 weeks    OT Treatment/Interventions  Self-care/ADL training;Therapeutic exercise;Visual/perceptual remediation/compensation;Coping strategies training;Neuromuscular education;Patient/family education;Therapeutic activities;Functional Mobility Training;DME and/or AE instruction;Passive range of motion    Plan  light IADLS (Simulate laundry task, snack prep    Consulted and Agree with Plan of Care  Patient       Patient will benefit from skilled therapeutic intervention  in order to improve the following deficits and impairments:  Body Structure / Function / Physical Skills  Visit Diagnosis: Visuospatial deficit    Problem List Patient Active Problem List   Diagnosis Date Noted  . Vertebral artery dissection ([place])   . Elevated blood pressure reading   . Major depressive disorder   . Panic disorder   . CVA (cerebral vascular accident) ([place]) 01/02/2019  . Acute ischemic stroke ([place]) 01/02/2019  . S/P cesarean section 05/05/2016  . S/P tubal ligation 05/05/2016  . Term pregnancy, repeat 05/04/2016  . Unspecified vitamin D deficiency 12/18/2012  . ADHD (attention deficit hyperactivity disorder) 11/26/2011  . Dysthymia 11/26/2011    Carey Bullocks, OTR/L 02/05/2019, 10:51 AM  [facility] [address] [place], [place], [address] Phone: [phone]   Fax:  [phone]  Name: GENELDA ROARK MRN: [id number] Date of Birth: 07-08-1981

## 2019-02-07 ENCOUNTER — Ambulatory Visit: Payer: 59 | Admitting: Occupational Therapy

## 2019-02-07 ENCOUNTER — Ambulatory Visit: Payer: 59 | Admitting: Rehabilitative and Restorative Service Providers"

## 2019-02-07 ENCOUNTER — Encounter: Payer: Self-pay | Admitting: Rehabilitative and Restorative Service Providers"

## 2019-02-07 ENCOUNTER — Other Ambulatory Visit: Payer: Self-pay

## 2019-02-07 VITALS — BP 138/97 | HR 90

## 2019-02-07 DIAGNOSIS — M6281 Muscle weakness (generalized): Secondary | ICD-10-CM

## 2019-02-07 DIAGNOSIS — R2689 Other abnormalities of gait and mobility: Secondary | ICD-10-CM

## 2019-02-07 DIAGNOSIS — R41842 Visuospatial deficit: Secondary | ICD-10-CM

## 2019-02-07 DIAGNOSIS — R2681 Unsteadiness on feet: Secondary | ICD-10-CM

## 2019-02-07 DIAGNOSIS — R278 Other lack of coordination: Secondary | ICD-10-CM

## 2019-02-07 DIAGNOSIS — I7774 Dissection of vertebral artery: Secondary | ICD-10-CM | POA: Diagnosis not present

## 2019-02-07 NOTE — Therapy (Signed)
[facility] [address] [place], [place], [address] Phone: [phone]   Fax:  [phone]  Occupational Therapy Treatment  Patient Details  Name: [name] MRN: [id number] Date of Birth: 1981/04/25 Referring Provider (OT): Dr. Letta Pate   Encounter Date: 02/07/2019  OT End of Session - 02/07/19 1434    Visit Number  5    Number of Visits  17    Date for OT Re-Evaluation  03/18/19    Authorization Type  [employer] (23 visit limit for OT)    Authorization - Visit Number  5    Authorization - Number of Visits  23    OT Start Time  1100    OT Stop Time  1145    OT Time Calculation (min)  45 min    Activity Tolerance  Patient tolerated treatment well    Behavior During Therapy  [facility] for tasks assessed/performed       Past Medical History:  Diagnosis Date  . ADD (attention deficit disorder)   . Exercise-induced asthma   . Hx of varicella   . Kidney stones   . S/P cesarean section 05/05/2016  . S/P tubal ligation 05/05/2016  . Term pregnancy, repeat 05/04/2016    Past Surgical History:  Procedure Laterality Date  . BACK SURGERY     scoliosis age 79  . BILATERAL SALPINGECTOMY Bilateral 05/05/2016   Procedure: BILATERAL SALPINGECTOMY;  Surgeon: Janyth Contes, MD;  Location: [place];  Service: Obstetrics;  Laterality: Bilateral;  . CESAREAN SECTION    . CESAREAN SECTION N/A 05/05/2016   Procedure: CESAREAN SECTION;  Surgeon: Janyth Contes, MD;  Location: [place];  Service: Obstetrics;  Laterality: N/A;  . CHOLECYSTECTOMY    . HAND SURGERY    . HERNIA REPAIR    . KIDNEY STONE SURGERY     R--surgery; L lithotripsy.  Dr. Gaynelle Arabian    There were no vitals filed for this visit.  Subjective Assessment - 02/07/19 1433    Pertinent History  Rt cerebellar and Bil PCA infarcts secondary to Rt verterbral artery dissection on 01/02/19. PMH: ADHD, panic attacks, ex induced asthma    Limitations  **fall risk, no driving, diplopia    Currently in Pain?  No/denies       BP = 137/96 after performing below ADLS Simulated laundry task putting clothes in washer, setting parameters, moving to dryer and setting parameters, and removing from dryer all for: functional vision, use of dominant RUE, and balance. Pt required cues to use RUE and one hand support or body against stable surface to perform task w/o LOB. Pt then retrieved light dishes from cabinet w/ RUE (w/ cues to prevent sh hiking) and used microwave for balance w/ CGA using gait belt when ambulating  Practiced carrying cup 1/2 full of water in RUE w/ ambulation - cues to prevent sh hiking, difficulty keeping water stable and LOB x1 (therapist provided CGA to min assist w/ gait belt).  Practiced dribbling large ball, and tossing smaller ball RUE for gross motor coordination w/ body supported against counter to prevent LOB.  Pt shown ways to practice accuracy and distance of movement for reaching for cups at various distances                      OT Short Term Goals - 02/07/19 1434      OT SHORT TERM GOAL #1   Title  Independent w/ coordination HEP - 02/15/19  Time  4    Period  Weeks    Status  Achieved      OT SHORT TERM GOAL #2   Title  Independent with diplopia HEP     Time  4    Period  Weeks    Status  Achieved      OT SHORT TERM GOAL #3   Title  Improve fine motor coordination Rt hand as evidenced by performing 9 hole peg test in 42 sec or under    Baseline  49.41 sec    Time  4    Period  Weeks    Status  New      OT SHORT TERM GOAL #4   Title  Pt to make snack from standing level w/ supervision    Time  4    Period  Weeks    Status  On-going      OT SHORT TERM GOAL #5   Title  Pt to perform light household tasks (laundry, Manufacturing engineer) w/ counter support x 10 min. w/o rest or LOB    Time  4    Period  Weeks    Status  On-going      OT SHORT TERM GOAL #6    Title  Pt to read 1 line of enlarged text w/ improvements in diplopia per pt report    Time  4    Period  Weeks    Status  On-going        OT Long Term Goals - 01/30/19 0856      OT LONG TERM GOAL #1   Title  Independent with updated HEP prn - 03/18/19    Time  8    Period  Weeks    Status  New      OT LONG TERM GOAL #2   Title  Pt to return to light cooking tasks for 20 min. w/ distant supervision    Time  8    Period  Weeks    Status  New      OT LONG TERM GOAL #3   Title  Pt to return to moderately complex household tasks w/ distant supervision    Time  8    Period  Weeks    Status  New      OT LONG TERM GOAL #4   Title  Pt to improve grip strength Rt dominant hand by 10 lbs     Baseline  65 lbs    Time  8    Period  Weeks    Status  New      OT LONG TERM GOAL #5   Title  Pt to improve fine motor coordination Rt hand as evidenced by reducing speed on 9 hole peg test to 35 sec. or less    Baseline  49.41 sec    Time  8    Period  Weeks    Status  New      OT LONG TERM GOAL #6   Title  Pt able to read paragraph of text w/ minimal diplopia    Time  8    Period  Weeks    Status  New            Plan - 02/07/19 1435    Clinical Impression Statement  Pt progressing w/ functional household tasks. Pt still at fall risk but compensating better    Occupational Profile and client history currently impacting functional performance  Pt is wife and mother  of 4 children. Current deficits affect ability to perform ADLS, activities, life roles. PMH: ADHD, panic attacks, exercise induced asthma    Occupational performance deficits (Please refer to evaluation for details):  ADL's;IADL's;Leisure;Other    Body Structure / Function / Physical Skills  UE functional use;ROM;FMC;Mobility;Vision;Dexterity;Strength;Endurance;Coordination;IADL;Decreased knowledge of precautions;Decreased knowledge of use of DME    Rehab Potential  Good    OT Frequency  2x / week    OT Duration  8  weeks    OT Treatment/Interventions  Self-care/ADL training;Therapeutic exercise;Visual/perceptual remediation/compensation;Coping strategies training;Neuromuscular education;Patient/family education;Therapeutic activities;Functional Mobility Training;DME and/or AE instruction;Passive range of motion    Plan  continue RUE gross and fine motor coordination [facility] w/ visual component - pegs) in standing w/ close sup    Consulted and Agree with Plan of Care  Patient       Patient will benefit from skilled therapeutic intervention in order to improve the following deficits and impairments:  Body Structure / Function / Physical Skills  Visit Diagnosis: Unsteadiness on feet  Other lack of coordination  Visuospatial deficit    Problem List Patient Active Problem List   Diagnosis Date Noted  . Vertebral artery dissection ([place])   . Elevated blood pressure reading   . Major depressive disorder   . Panic disorder   . CVA (cerebral vascular accident) ([place]) 01/02/2019  . Acute ischemic stroke ([place]) 01/02/2019  . S/P cesarean section 05/05/2016  . S/P tubal ligation 05/05/2016  . Term pregnancy, repeat 05/04/2016  . Unspecified vitamin D deficiency 12/18/2012  . ADHD (attention deficit hyperactivity disorder) 11/26/2011  . Dysthymia 11/26/2011    Carey Bullocks, OTR/L 02/07/2019, 2:36 PM  [facility] [address] [place], [place], [address] Phone: [phone]   Fax:  [phone]  Name: [name] MRN: [id number] Date of Birth: [birth year]

## 2019-02-08 NOTE — Therapy (Signed)
[facility] [address] [place], [place], [address] Phone: [phone]   Fax:  [phone]  Physical Therapy Treatment  Patient Details  Name: [name] MRN: [id number] Date of Birth: 02-26-1981 Referring Provider (PT): Dr. [name] Penna   Encounter Date: 02/07/2019  PT End of Session - 02/07/19 1026    Visit Number  5    Number of Visits  17    Date for PT Re-Evaluation  03/21/19    Authorization Type  [employer]    PT Start Time  1020    PT Stop Time  1105    PT Time Calculation (min)  45 min    Activity Tolerance  Patient tolerated treatment well    Behavior During Therapy  [facility] for tasks assessed/performed       Past Medical History:  Diagnosis Date  . ADD (attention deficit disorder)   . Exercise-induced asthma   . Hx of varicella   . Kidney stones   . S/P cesarean section 05/05/2016  . S/P tubal ligation 05/05/2016  . Term pregnancy, repeat 05/04/2016    Past Surgical History:  Procedure Laterality Date  . BACK SURGERY     scoliosis age 40  . BILATERAL SALPINGECTOMY Bilateral 05/05/2016   Procedure: BILATERAL SALPINGECTOMY;  Surgeon: Janyth Contes, MD;  Location: [facility];  Service: Obstetrics;  Laterality: Bilateral;  . CESAREAN SECTION    . CESAREAN SECTION N/A 05/05/2016   Procedure: CESAREAN SECTION;  Surgeon: Janyth Contes, MD;  Location: [facility];  Service: Obstetrics;  Laterality: N/A;  . CHOLECYSTECTOMY    . HAND SURGERY    . HERNIA REPAIR    . KIDNEY STONE SURGERY     R--surgery; L lithotripsy.  Dr. Gaynelle Arabian    Vitals:   02/07/19 1025 02/07/19 1049  BP: (!) 131/99 (!) 138/97  Pulse:  90    Subjective Assessment - 02/07/19 1024    Subjective  The patient notes her kids are out of school.  She is walking without a device at all times.  She started a fluid pill to help reduce BP.    Pertinent History  CVA, ADHD/ADD, panic attacks, exercise induced  asthma    Patient Stated Goals  "Using my Right side" - feels as if it is weaker and discoordinated                       [facility] Adult PT Treatment/Exercise - 02/07/19 1057      Ambulation/Gait   Ambulation/Gait  Yes    Ambulation/Gait Assistance  5: Supervision;4: Min guard    Ambulation Distance (Feet)  400 Feet    Assistive device  None    Gait Pattern  Step-through pattern;Decreased stride length;Decreased weight shift to right;Ataxic    Ambulation Surface  Level;Indoor    Gait velocity  2.22 ft/sec    Gait Comments  Gait activities working on progressing speed, tactile cues with R arm swing and to depress R shoulder.  Forward/backwards walking with CGA to min A.        Neuro Re-ed    Neuro Re-ed Details   In parallel bars, performed foam standing tapping cones with CGA dec'ing UE support working on control of the R leg and coordination/accuracy with cone taps.  Then performed foam + head motion horizontal plane and then with eyes closed.  Rocker board with eyes open + head motion, eyes closed, and then eyes open + UE weights punching forward  and overhead with 3 lb weights (PT guided R UE to ensure safe as she has mild dyscoordination noted with weighted UE movements).   Single leg stance activities performing isometric press into the wall while maintaining balance on R LE.        Exercises   Exercises  Other Exercises    Other Exercises   On elevated mat plinthe, performed push up position (arms higher than feet) and turned to perform side plank R and L x 5 reps each side.                 PT Short Term Goals - 02/07/19 1056      PT SHORT TERM GOAL #1   Title  patient to be independent with initial HEP    Time  4    Period  Weeks    Status  On-going    Target Date  02/14/19      PT SHORT TERM GOAL #2   Title  patient to improve gait speed with no AD to >/= 2.62 ft/sec    Baseline  2.22 ft/sec without device (1.9 ft/sec at eval)    Time  4    Period   Weeks    Status  Partially Met    Target Date  02/14/19      PT SHORT TERM GOAL #3   Title  patient to improve Berg to >/= 48/56 demonstrating improved balance    Time  4    Period  Weeks    Status  On-going    Target Date  02/14/19      PT SHORT TERM GOAL #4   Title  patient to improve FGA to >/= 16/30    Time  4    Period  Weeks    Status  On-going    Target Date  02/14/19      PT SHORT TERM GOAL #5   Title  patient to ambulate up/down 1 flight of steps with single to no handrail and reciprocal pattern    Time  4    Period  Weeks    Status  On-going    Target Date  02/14/19        PT Long Term Goals - 01/17/19 1621      PT LONG TERM GOAL #1   Title  patient to be independent with advanced HEP    Time  8    Period  Weeks    Status  New    Target Date  03/21/19      PT LONG TERM GOAL #2   Title  patient to improve Berg to >/= 52/56    Time  8    Period  Weeks    Status  New    Target Date  03/21/19      PT LONG TERM GOAL #3   Title  patient to improve FGA to >/= 20/30    Time  8    Period  Weeks    Status  New    Target Date  03/21/19      PT LONG TERM GOAL #4   Title  patient to demonstrate gait over various surfaces and inclines with Mod I without LOB with LRAD for >/= 500 feet    Time  8    Period  Weeks    Status  New    Target Date  03/21/19      PT LONG TERM GOAL #5   Title  patient to  improve gait speed to >/= 3.5 ft/sec    Time  8    Period  Weeks    Status  New    Target Date  03/21/19            Plan - 02/07/19 1047    Clinical Impression Statement  The patient is progresing with gait speed and ability to ambulate without holding walls while walking.  She continues with dec'd R arm swing, wider base of support and dec'd weight shift to the right side.  PT to continue working to [employer].     PT Treatment/Interventions  ADLs/Self Care Home Management;Cryotherapy;Electrical Stimulation;Functional mobility training;Stair training;Gait  training;DME Instruction;Moist Heat;Therapeutic activities;Therapeutic exercise;Balance training;Neuromuscular re-education;Patient/family education;Orthotic Fit/Training;Passive range of motion;Manual techniques;Taping;Vestibular    PT Next Visit Plan  Keep an eye on BP-may have to cancel or limit activity, dynamic gait (backwards walking, sidestepping, etc); single leg stance activities near support; coordination activities R LE - weight shifitng activities with dual task    Consulted and Agree with Plan of Care  Patient       Patient will benefit from skilled therapeutic intervention in order to improve the following deficits and impairments:  Abnormal gait, Decreased balance, Decreased mobility, Decreased strength, Decreased coordination, Difficulty walking, Decreased safety awareness  Visit Diagnosis: Other abnormalities of gait and mobility  Unsteadiness on feet  Other lack of coordination  Muscle weakness (generalized)     Problem List Patient Active Problem List   Diagnosis Date Noted  . Vertebral artery dissection ([place])   . Elevated blood pressure reading   . Major depressive disorder   . Panic disorder   . CVA (cerebral vascular accident) ([facility]) 01/02/2019  . Acute ischemic stroke ([facility]) 01/02/2019  . S/P cesarean section 05/05/2016  . S/P tubal ligation 05/05/2016  . Term pregnancy, repeat 05/04/2016  . Unspecified vitamin D deficiency 12/18/2012  . ADHD (attention deficit hyperactivity disorder) 11/26/2011  . Dysthymia 11/26/2011    Roshawnda Pecora, PT 02/08/2019, 8:29 AM  [facility] [address] [place], [place], [address] Phone: [phone]   Fax:  [phone]  Name: SAIDE LANUZA MRN: [id number] Date of Birth: Sep 07, 1981

## 2019-02-12 ENCOUNTER — Telehealth: Payer: Self-pay

## 2019-02-12 ENCOUNTER — Ambulatory Visit: Payer: 59 | Admitting: Occupational Therapy

## 2019-02-12 ENCOUNTER — Ambulatory Visit: Payer: 59 | Admitting: Rehabilitative and Restorative Service Providers"

## 2019-02-12 NOTE — Telephone Encounter (Signed)
[name] was contacted today regarding the temporary closing of [facility] due to Covid-19.  Therapist left message and discussed:  Continuing exercises, and possibility of telehealth visits. Therapist will attempt to call back   Unknown if pt is interested in further information for an e-visit, virtual check in, or telehealth visit, if those services become available.    [facility] will follow up with patients when we are able to resume care.  [name], OTR/L [facility] [address] [place], [place]  [address] Phone:  [phone] Fax:  [phone] \

## 2019-02-12 NOTE — Telephone Encounter (Signed)
[name] was contacted again today and able to directly talk w/ her regarding the temporary closing of [facility] due to Covid-19.  Therapist discussed:  Next scheduled appointments and recommended continuing ex's provided by [facility].  Patient is interested in further information for an e-visit, virtual check in, or telehealth visit, if those services become available.    [facility] will follow up with patients when we are able to resume care.  [name], OTR/L [facility] [address] [place],   [address] Phone:  [phone] Fax:  [phone] \

## 2019-02-14 ENCOUNTER — Telehealth: Payer: Self-pay | Admitting: Family Medicine

## 2019-02-14 MED ORDER — SERTRALINE HCL 100 MG PO TABS: 100.0000 mg | ORAL_TABLET | Freq: Every day | ORAL | 1 refills | Status: DC

## 2019-02-14 NOTE — Telephone Encounter (Signed)
Pt called and is requesting a refill on her zoloft pt uses [facility], [place]

## 2019-02-15 ENCOUNTER — Ambulatory Visit: Payer: 59 | Admitting: Occupational Therapy

## 2019-02-15 ENCOUNTER — Ambulatory Visit: Payer: 59 | Admitting: Rehabilitative and Restorative Service Providers"

## 2019-02-19 ENCOUNTER — Ambulatory Visit: Payer: 59 | Admitting: Rehabilitation

## 2019-02-19 ENCOUNTER — Encounter: Payer: 59 | Admitting: Occupational Therapy

## 2019-02-20 ENCOUNTER — Telehealth: Payer: Self-pay

## 2019-02-20 NOTE — Telephone Encounter (Signed)
LEft pt a detail message about video visit prefer because she is a new pt to our office.Explain the COVID concerns. Need consent for video visit and to file insurance, and that co payment is waive but will still be filed. Left vm to call back. Left pt mychart message of reason to call back.

## 2019-02-21 NOTE — Progress Notes (Signed)
[facility] [address] [place]. [place] [address] [phone]       VIRTUAL VISIT FOLLOW UP NOTE  [name] Date of Birth:  [birth year] Medical Record Number:  [id number]   Reason for Referral:  hospital stroke follow up    Virtual Visit via Video Note  I connected with@ on 02/22/19 at  8:15 AM EDT by a video enabled telemedicine application located remotely at my own home and verified that I am speaking with the correct person using two identifiers who was located at their own home.   I discussed the limitations of evaluation and management by telemedicine and the availability of in person appointments. The patient expressed understanding and agreed to proceed.   CHIEF COMPLAINT:  Chief Complaint  Patient presents with  . 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 History obtained from patient and chart review. Reviewed all radiology images and labs personally.  Tonya Criswell Scottis a 38 y.o.femalepast medical history of ADD and panic attacks, who presented to [facility] ED for evaluation of an episode of panic attack. According to the husband, she had been complaining of days to weeks of right-sided neck pain. No other focal deficits were noted by the family. The morning of admission, she woke up with a bad headache and neck pain along with feelings of anxiety. When attempts were made in the ED to have her ambulate, she was very wobbly and ataxic. That evening, a CT head obtained due to lethargy and ongoing complaints of dizziness. CT head reviewed and showed a evolving acute ischemic right cerebellar and bilateral PCA territory infarcts with involvement of bilateral  occipital lobes and thalami along with trace petechial hemorrhage at the occipital poles without frank hemorrhagic transformation. There was associated mass-effect within the right posterior fossa with partial effacement of the right basilar cisterns and fourth ventricle but no obstructive hydrocephalus at the time. CTA head and neck showed acute right vertebral artery dissection involving the right V3 segment at the level of the right C1 transverse foramen with probable small dissection flap just distally at the right V3 V4 junction with associated moderate stenosis. There is also probable distal bilateral P3 P4 occlusions possibly thromboembolic in nature. No stenosis or occlusions in the anterior circulation. Denies any neck trauma, manipulation or accidents. MRI brain reviewed and showed multifocal right cerebellar infarction relfecting R VA (B thalamic, left temporal lobe and right occipital lobe reflecting thalamoperforate and B PCA) with significant mass effect on fourth ventricle without hydrocephalus. 2D echo showed EF of 60-65% without cardiac source of embolus.  LDL 88 and A1c 5.7. Other stroke risk factors include obesity but no prior history of stroke. She was discharged to CIR in stable condition on 01/03/19 and discharged home on 01/16/19 with outpatient therapy recommendations.   She reports since being home she has been doing well with continued residual deficits of right hemiparesis and visual deficits. She has returned back to all prior activites but does need moderate assistance with cooking due to right arm weakness. She was participating in therapy thru neuro rehab but due to pandemic, this has been postponed.  She has a difficult time explaining her visual concerns but per what she  reports, appears to be diplopia right eye greater than left eye.  Does not appear to have difficulties with peripheral vision.  She states that when she attempts to read something or watch TV, she will close her  right eye and is able to see much clear throughout her left.  She plans on calling neuro-ophthalmology Dr. Frederico Hamman which was recommended during CIR.  She does endorse occasional dizziness only when she tilts her head up but will resolve once in the neutral position.  She denies any other periods of dizziness or balance difficulties.  She denies neck pain or headaches. She continues on aspirin 325mg  daily without side effects of bleeding or bruising.  She does monitor BP at home and was recently started on hydrochlorothiazide by her PCP on 02/05/2019 with BP typically ranging 130s/90s.  She did check BP during visit which resulted in 141/99 and heart rate 87.  She does endorses being typically higher than what she has been obtaining prior and feels as though it is due to increased anxiety with virtual visit along with attempting to care for her 2 children during the visit as her husband is currently working.  No further concerns at this time.  Denies new or worsening stroke/TIA symptoms.     ROS:   14 system review of systems performed and negative with exception of visual loss, weakness, gait difficulty and dizziness  PMH:  Past Medical History:  Diagnosis Date  . ADD (attention deficit disorder)   . Exercise-induced asthma   . Hx of varicella   . Kidney stones   . S/P cesarean section 05/05/2016  . S/P tubal ligation 05/05/2016  . Stroke ([place])   . Term pregnancy, repeat 05/04/2016    PSH:  Past Surgical History:  Procedure Laterality Date  . BACK SURGERY     scoliosis age 65  . BILATERAL SALPINGECTOMY Bilateral 05/05/2016   Procedure: BILATERAL SALPINGECTOMY;  Surgeon: Janyth Contes, MD;  Location: [facility];  Service: Obstetrics;  Laterality: Bilateral;  . CESAREAN SECTION    . CESAREAN SECTION N/A 05/05/2016   Procedure: CESAREAN SECTION;  Surgeon: Janyth Contes, MD;  Location: [facility];  Service: Obstetrics;  Laterality: N/A;  . CHOLECYSTECTOMY    . HAND  SURGERY    . HERNIA REPAIR    . KIDNEY STONE SURGERY     R--surgery; L lithotripsy.  Dr. Gaynelle Arabian    Social History:  Social History   Socioeconomic History  . Marital status: Married    Spouse name: Not on file  . Number of children: Not on file  . Years of education: Not on file  . Highest education level: Not on file  Occupational History  . Not on file  Social Needs  . Financial resource strain: Not on file  . Food insecurity:    Worry: Not on file    Inability: Not on file  . Transportation needs:    Medical: Not on file    Non-medical: Not on file  Tobacco Use  . Smoking status: Never Smoker  . Smokeless tobacco: Never Used  Substance and Sexual Activity  . Alcohol use: No    Comment: occasional wine  . Drug use: No  . Sexual activity: Yes    Partners: Male    Birth control/protection: Surgical  Lifestyle  . Physical activity:    Days per week: Not on file    Minutes per session: Not on file  . Stress: Not on file  Relationships  . Social  connections:    Talks on phone: Not on file    Gets together: Not on file    Attends religious service: Not on file    Active member of club or organization: Not on file    Attends meetings of clubs or organizations: Not on file    Relationship status: Not on file  . Intimate partner violence:    Fear of current or ex partner: Not on file    Emotionally abused: Not on file    Physically abused: Not on file    Forced sexual activity: Not on file  Other Topics Concern  . Not on file  Social History Narrative  . Not on file    Family History:  Family History  Problem Relation Age of Onset  . Cancer Mother   . Cancer Maternal Aunt   . Cancer Maternal Grandmother     Medications:   Current Outpatient Medications on File Prior to Visit  Medication Sig Dispense Refill  . acetaminophen (TYLENOL) 325 MG tablet Take 2 tablets (650 mg total) by mouth every 4 (four) hours as needed for mild pain (or temp > 37.5 C  (99.5 F)).    Marland Kitchen ALPRAZolam (XANAX) 0.25 MG tablet Take 1 tablet (0.25 mg total) by mouth 2 (two) times daily as needed for anxiety. 20 tablet 0  . aspirin EC 325 MG EC tablet Take 1 tablet (325 mg total) by mouth daily. 30 tablet 0  . hydrochlorothiazide (MICROZIDE) 12.5 MG capsule Take 1 capsule (12.5 mg total) by mouth daily. 30 capsule 1  . sertraline (ZOLOFT) 100 MG tablet Take 1 tablet (100 mg total) by mouth at bedtime. 90 tablet 1   No current facility-administered medications on file prior to visit.     Allergies:  No Known Allergies   Physical Exam  Vitals:   02/22/19 0848  BP: (!) 141/99  Pulse: 87   *Obtained by patient in her own home by her own machine during visit*  Depression screen [facility] 2/9 02/22/2019  Decreased Interest 0  Down, Depressed, Hopeless 0  PHQ - 2 Score 0     General: well developed, well nourished, pleasant young Caucasian female, seated, in no evident distress Head: head normocephalic and atraumatic.     Neurologic Exam Mental Status: Awake and fully alert. Oriented to place and time. Recent and remote memory intact. Attention span, concentration and fund of knowledge appropriate. Mood and affect appropriate.  Cranial Nerves: Extraocular movements full without nystagmus. Visual fields unable to assess.  Hearing is intact to voice.  Shoulder shrug symmetric bilaterally.  Face, tongue, palate moves normally and symmetrically.  Motor: Slight pronator drift present in right upper extremity with slight ataxia; decreased right hand finger taps; orbits left arm over right arm; able to stand up with arms crossed without difficulty Sensory.:  Unable to assess Cerebellar: Finger-to-nose with ataxia RUE; heel-to-shin with ataxia RLE  Gait and Station: Arises from chair without difficulty. Stance is normal. Gait demonstrates right-sided hemiplegic gait; unable to tandem walk    NIHSS unable to assess as sensation unable to be examined Modified Rankin  2     Diagnostic Data (Labs, Imaging, Testing)  Ct Angio Head W Or Wo Contrast 01/02/2019 IMPRESSION:   CT HEAD  IMPRESSION  1. 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 Associated trace petechial hemorrhage at the occipital poles without frank hemorrhagic transformation.  2. Associated mass effect  within the right posterior fossa with partial effacement of the right basilar cisterns and fourth ventricle. No obstructive hydrocephalus at this time.   CTA HEAD AND NECK  IMPRESSION  1. Acute right vertebral artery dissection, involving the right V3 segment at the level of the right C1 transverse foramen, with probable small dissection flap just distally at the right V3/V4 junction. Associated moderate stenosis.  2. 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  3. Otherwise negative   CTA of the head and neck.  Normal left vertebral artery. 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    Ct Head Wo Contrast 01/05/2019 IMPRESSION:  1. 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  2. Otherwise expected evolution of thalamic and bilateral occipital lobe infarcts without acute hemorrhage.   Ct Head Wo Contrast 01/03/2019 IMPRESSION:  1. Unchanged appearance of posterior circulation infarcts involving the right cerebellum, both thalami and both occipital lobes.  2. Unchanged posterior fossa edema with narrowing of the basal cisterns and fourth ventricle without hydrocephalus.     Mr Brain Wo Contrast 01/03/2019 IMPRESSION:  Multifocal areas of acute infarction, predominantly affecting the RIGHT cerebellum reflecting RIGHT vertebral dissection, but also areas of acute infarction  elsewhere in the posterior circulation, notably both thalami, LEFT temporal lobe, and RIGHT occipital lobe reflecting thalamoperforate and BILATERAL PCA territory ischemia, consistent with distal emboli. Abnormal flow void RIGHT vertebral reflecting the previously identified dissection. No features to suggest hemorrhagic transformation. Significant mass effect on the outlet foramina the fourth ventricle without current features of hydrocephalus. Close continued surveillance is warranted.    ASSESSMENT: [name] is a 38 y.o. 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 Vascular risk factors include obesity.  She has been stable since returning home with residual poststroke deficits of right hemiparesis and diplopia but otherwise stable without new or worsening stroke/TIA symptoms.    PLAN:  1. R PCA infarct : Continue aspirin 325 mg daily for secondary stroke prevention. Maintain strict control of hypertension with blood pressure goal below 130/90, diabetes with hemoglobin A1c goal below 6.5% and cholesterol with LDL cholesterol (bad cholesterol) goal below 70 mg/dL.  I also advised the patient to eat a healthy diet with plenty of whole grains, cereals, fruits and vegetables, exercise regularly with at least 30 minutes of continuous activity daily and maintain ideal body weight. 2. HTN: Advised to continue current treatment regimen.  Today's BP 141/99.  Advised to continue to monitor at home along with continued follow-up with PCP for management 3. Residual deficits: Highly encouraged continuation of therapy exercises at home and to restart therapy once able.  She is also advised to schedule appointment with neuro-ophthalmologist for further evaluation of visual deficits 4. Discussion regarding neck precautions with avoiding trauma and rapid movements    Follow up in 4 months or call earlier if needed   Greater than  50% of time during this 25 minute visit was spent on counseling, explanation of diagnosis of right PCA infarct, reviewing risk factor management of HTN, planning of further management along with potential future management, and discussion with patient and family answering all questions.    Venancio Poisson, AGNP-BC  [facility] [address] [place], [place] [address]  Phone [phone] Fax [phone] Note: This document  was prepared with digital dictation and possible smart phrase technology. Any transcriptional errors that result from this process are unintentional.

## 2019-02-21 NOTE — Telephone Encounter (Signed)
Late entry from 02/20/2019 I spoke with patient and she consented to video web ex and also to [employer]. Pt given directions on how to download.

## 2019-02-21 NOTE — Telephone Encounter (Signed)
Left vm for patient if she has downloaded the web ex site to do video on Thursday.

## 2019-02-22 ENCOUNTER — Encounter: Payer: Self-pay | Admitting: Family Medicine

## 2019-02-22 ENCOUNTER — Encounter: Payer: Self-pay | Admitting: Adult Health

## 2019-02-22 ENCOUNTER — Encounter: Payer: 59 | Admitting: Occupational Therapy

## 2019-02-22 ENCOUNTER — Ambulatory Visit (INDEPENDENT_AMBULATORY_CARE_PROVIDER_SITE_OTHER): Payer: 59 | Admitting: Family Medicine

## 2019-02-22 ENCOUNTER — Ambulatory Visit: Payer: 59 | Admitting: Rehabilitation

## 2019-02-22 ENCOUNTER — Other Ambulatory Visit: Payer: Self-pay

## 2019-02-22 ENCOUNTER — Ambulatory Visit (INDEPENDENT_AMBULATORY_CARE_PROVIDER_SITE_OTHER): Payer: 59 | Admitting: Adult Health

## 2019-02-22 ENCOUNTER — Telehealth: Payer: Self-pay | Admitting: Rehabilitative and Restorative Service Providers"

## 2019-02-22 VITALS — BP 141/99 | HR 87

## 2019-02-22 VITALS — Wt 165.0 lb

## 2019-02-22 DIAGNOSIS — I10 Essential (primary) hypertension: Secondary | ICD-10-CM

## 2019-02-22 DIAGNOSIS — H532 Diplopia: Secondary | ICD-10-CM

## 2019-02-22 DIAGNOSIS — I63331 Cerebral infarction due to thrombosis of right posterior cerebral artery: Secondary | ICD-10-CM

## 2019-02-22 DIAGNOSIS — I69351 Hemiplegia and hemiparesis following cerebral infarction affecting right dominant side: Secondary | ICD-10-CM | POA: Diagnosis not present

## 2019-02-22 DIAGNOSIS — I7774 Dissection of vertebral artery: Secondary | ICD-10-CM

## 2019-02-22 MED ORDER — LOSARTAN POTASSIUM-HCTZ 50-12.5 MG PO TABS: 1.0000 | ORAL_TABLET | Freq: Every day | ORAL | 3 refills | Status: DC

## 2019-02-22 NOTE — Progress Notes (Signed)
Documentation for virtual telephone encounter.  Documentation for virtual audio and video telecommunications through Zoom encounter:  The patient was located at home. The provider was located in the office. The patient did consent to this visit and is aware of possible charges through their insurance for this visit.  virtual service is not related to other E/M service within previous 7 days The other persons participating in this telemedicine service were none.   Subjective:    Patient ID: [name], female    DOB: 1981/03/13, 38 y.o.   MRN: [id number]  HPI She is here for virtual visit concerning her blood pressure.  She does have a cuff at home and has been taking it.  It is recorded in the chart.  She is having no difficulty with the HCTZ.  She was seen earlier today via tele-med by the neurology office.   Review of Systems     Objective:   Physical Exam Alert and in no distress.  Blood pressure at home is recorded.       Assessment & Plan:  . Essential hypertension - Plan: losartan-hydrochlorothiazide (HYZAAR) 50-12.5 MG tablet She is to return here in 1 month and bring her blood pressure cuff for comparison against ours.

## 2019-02-22 NOTE — Progress Notes (Signed)
I agree with the above plan 

## 2019-02-22 NOTE — Telephone Encounter (Signed)
[name] was contacted today regarding temporary reduction of Outpatient Neuro Rehabilitation Services due to concerns for community transmission of COVID-19.  Patient identity was verified.   The patient was scheduled for a 9am telehealth visit for OP rehab.  Patient is aware we can be reached by telephone during limited business hours in the meantime. (include at end of note)  [place], [place]

## 2019-02-23 ENCOUNTER — Encounter: Payer: Self-pay | Admitting: Rehabilitative and Restorative Service Providers"

## 2019-02-23 ENCOUNTER — Ambulatory Visit: Payer: 59 | Attending: Physical Medicine & Rehabilitation | Admitting: Rehabilitative and Restorative Service Providers"

## 2019-02-23 ENCOUNTER — Ambulatory Visit: Payer: 59 | Admitting: Occupational Therapy

## 2019-02-23 VITALS — BP 135/97

## 2019-02-23 VITALS — BP 143/93

## 2019-02-23 DIAGNOSIS — R41842 Visuospatial deficit: Secondary | ICD-10-CM | POA: Diagnosis present

## 2019-02-23 DIAGNOSIS — R278 Other lack of coordination: Secondary | ICD-10-CM

## 2019-02-23 DIAGNOSIS — M6281 Muscle weakness (generalized): Secondary | ICD-10-CM

## 2019-02-23 DIAGNOSIS — R2689 Other abnormalities of gait and mobility: Secondary | ICD-10-CM | POA: Diagnosis not present

## 2019-02-23 DIAGNOSIS — R2681 Unsteadiness on feet: Secondary | ICD-10-CM | POA: Insufficient documentation

## 2019-02-23 NOTE — Therapy (Signed)
[facility] [address] [place], [place], [address] Phone: [phone]   Fax:  [phone]  Occupational Therapy Treatment  Patient Details  Name:  [name] MRN: [id number] Date of Birth: January 05, 1981 Referring Provider (OT): Dr. Letta Pate  I witness when [name] connected with [name] today at 9:00 by WebEx video conference application and verified that she was speaking with the correct person using two identifiers. The patient's address was confirmed. Identified to the patient that therapists are Licensed PT and OT  in the state of [place]. She discussed the limitations, risks, security and privacy concerns of management by telehealth and the availability of in person appointments. She also discussed with the patient that there may be a patient responsible charge related to this service. The patient expressed understanding and consented to proceed.     Encounter Date: 02/23/2019  OT End of Session - 02/23/19 1143    Visit Number  6    Number of Visits  17    Date for OT Re-Evaluation  03/18/19    Authorization Type  UHC (23 visit limit for OT)    Authorization - Visit Number  6    Authorization - Number of Visits  23    OT Start Time  [phone]    OT Stop Time  1026    OT Time Calculation (min)  34 min    Activity Tolerance  Patient tolerated treatment well    Behavior During Therapy  WFL for tasks assessed/performed       Past Medical History:  Diagnosis Date  . ADD (attention deficit disorder)   . Exercise-induced asthma   . Hx of varicella   . Kidney stones   . S/P cesarean section 05/05/2016  . S/P tubal ligation 05/05/2016  . Stroke ([place])   . Term pregnancy, repeat 05/04/2016    Past Surgical History:  Procedure Laterality Date  . BACK SURGERY     scoliosis age 72  . BILATERAL SALPINGECTOMY Bilateral 05/05/2016   Procedure: BILATERAL SALPINGECTOMY;  Surgeon: Janyth Contes, MD;  Location: [facility];  Service: Obstetrics;  Laterality: Bilateral;  . CESAREAN SECTION    . CESAREAN SECTION N/A 05/05/2016   Procedure: CESAREAN SECTION;  Surgeon: Janyth Contes, MD;  Location: [facility];  Service: Obstetrics;  Laterality: N/A;  . CHOLECYSTECTOMY    . HAND SURGERY    . HERNIA REPAIR    . KIDNEY STONE SURGERY     R--surgery; L lithotripsy.  Dr. Gaynelle Arabian    Vitals:   02/23/19 1112  BP: (!) 143/93    Subjective Assessment - 02/23/19 1112    Subjective   I am not wearing the glasses anymore.  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 PMH: ADHD, panic attacks, ex induced asthma    Limitations  **fall risk, no driving, diplopia    Currently in Pain?  No/denies    Pain Score  0-No pain                   OT Treatments/Exercises (OP) - 02/23/19 0001      ADLs   Bathing  Observed PT session, and PT recommending avoiding full neck extension and flexion to reduce dizzy symptoms.  Discussed with patient as this relates to showering, washing, rinsing hair.  Eyes closed, head tipped back.  Patient has made some subtle changes  to reduce dizziness when showering.      Cooking  Patient's husband does most of the cooking - per patient "he cooks better than me"  Patient is assisting - washing dishes, putting away dishes.  Has attempted peeling vegetables - this is still challenging due to dyscoordination / ataxia RUE.  Patient does have small surface burn on right forearm from coming in contact with apan just out of the oven,  Patient aware, and still needing assistance in kitchen for meal prep.      Writing  Patient mentioned that she is slower to type than she was prior.  She hopes for a job in medical coding - so reading and typing would be important skills for the role.  Directed patient to type on ipad attached keyboard, and work to measure  improvement, e.g. how many words typed / minute - to help gauge progress.      Driving  Patient continues to have double vision, although no longer wearing glasses.  She reports images now are almost converged and present as a shadow around the object.        Visual/Perceptual Exercises   Other Exercises  Shared resource for reading focus guide.  Patient does report that her reading is slower, and she is missing words - although able to pick up error in context.  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  Patient reports improvement in environmental scanning.  If she is at the grocery store - she relies on the cart for stanility and balance, and if she has this reference point, she can visually scan for sales items.         Neurological Re-education Exercises   Other Exercises 1  Worked on mid to high reach patterns with patient.  Patient with notebale back extension when reaising RUE over 90 degrees of flexion.  Patient cued to "flatten back" by activating abdominal muscles.  Patient able to stabilize core, to allow her to raise her arm overhead without excessive back extesnion and momentum.  She will need further instruction to prevent maladaptive behaviors.  Patient with scoliosis, so she has lateral trunk curvature and therefore right shoulder is higher than left shoulder (trunk shortening on left) This is a fixed structural condition.  Patient also has a tendency to hike right shoulder when she is off balance.  Patient is unaware that shoulder is elevated - but if her attention is drawn to it, she can depress shoulder girdle.               OT Education - 02/23/19 1143    Education Details  reading focus guide - physical guide and electronic version    Person(s) Educated  Patient    Methods  Explanation;Demonstration   website   Comprehension  Verbalized understanding       OT Short Term Goals - 02/23/19 1147      OT SHORT TERM  GOAL #1   Title  Independent w/ coordination HEP - 02/15/19    Time  4    Period  Weeks    Status  Achieved      OT SHORT TERM GOAL #2   Title  Independent with diplopia HEP     Time  4    Period  Weeks    Status  Achieved      OT SHORT TERM GOAL #3   Title  Improve fine motor coordination Rt hand as evidenced by  performing 9 hole peg test in 42 sec or under    Baseline  49.41 sec    Time  4    Period  Weeks    Status  On-going      OT SHORT TERM GOAL #4   Title  Pt to make snack from standing level w/ supervision    Time  4    Period  Weeks    Status  On-going      OT SHORT TERM GOAL #5   Title  Pt to perform light household tasks [employer] officer, Manufacturing engineer) w/ counter support x 10 min. w/o rest or LOB    Time  4    Period  Weeks    Status  On-going      OT SHORT TERM GOAL #6   Title  Pt to read 1 line of enlarged text w/ improvements in diplopia per pt report    Period  Weeks    Status  Achieved        OT Long Term Goals - 01/30/19 9675      OT LONG TERM GOAL #1   Title  Independent with updated HEP prn - 03/18/19    Time  8    Period  Weeks    Status  New      OT LONG TERM GOAL #2   Title  Pt to return to light cooking tasks for 20 min. w/ distant supervision    Time  8    Period  Weeks    Status  New      OT LONG TERM GOAL #3   Title  Pt to return to moderately complex household tasks w/ distant supervision    Time  8    Period  Weeks    Status  New      OT LONG TERM GOAL #4   Title  Pt to improve grip strength Rt dominant hand by 10 lbs     Baseline  65 lbs    Time  8    Period  Weeks    Status  New      OT LONG TERM GOAL #5   Title  Pt to improve fine motor coordination Rt hand as evidenced by reducing speed on 9 hole peg test to 35 sec. or less    Baseline  49.41 sec    Time  8    Period  Weeks    Status  New      OT LONG TERM GOAL #6   Title  Pt able to read paragraph of text w/ minimal diplopia    Time  8    Period  Weeks     Status  New            Plan - 02/23/19 1144    Clinical Impression Statement  Patient is showing steady progress in her daily life skills due to a significant decrease in diplopia, and steadily improving balance skills.      Occupational Profile and client history currently impacting functional performance  Pt is wife and mother of 4 children. Current deficits affect ability to perform ADLS, activities, life roles. PMH: ADHD, panic attacks, exercise induced asthma    Occupational performance deficits (Please refer to evaluation for details):  ADL's;IADL's;Leisure;Other    Body Structure / Function / Physical Skills  UE functional use;ROM;FMC;Mobility;Vision;Dexterity;Strength;Endurance;Coordination;IADL;Decreased knowledge of precautions;Decreased knowledge of use of DME    Rehab Potential  Good    Clinical Decision Making  Several  treatment options, min-mod task modification necessary    OT Frequency  2x / week    OT Duration  8 weeks    OT Treatment/Interventions  Self-care/ADL training;Therapeutic exercise;Visual/perceptual remediation/compensation;Coping strategies training;Neuromuscular education;Patient/family education;Therapeutic activities;Functional Mobility Training;DME and/or AE instruction;Passive range of motion    Plan  review goals, mid to high reach, coordination and control in id ranges - especially elbow, check if questions about reading guide    Consulted and Agree with Plan of Care  Patient       Patient will benefit from skilled therapeutic intervention in order to improve the following deficits and impairments:     Visit Diagnosis: Unsteadiness on feet  Other lack of coordination  Visuospatial deficit  Muscle weakness (generalized)    Problem List Patient Active Problem List   Diagnosis Date Noted  . Vertebral artery dissection ([place])   . Elevated blood pressure reading   . Major depressive disorder   . Panic disorder   . CVA (cerebral vascular  accident) ([place]) 01/02/2019  . Acute ischemic stroke ([facility]) 01/02/2019  . S/P cesarean section 05/05/2016  . S/P tubal ligation 05/05/2016  . Term pregnancy, repeat 05/04/2016  . Unspecified vitamin D deficiency 12/18/2012  . ADHD (attention deficit hyperactivity disorder) 11/26/2011  . Dysthymia 11/26/2011    [name], OTR/L 02/23/2019, 11:58 AM  [facility] [address] [place], [place], [address] Phone: [phone]   Fax:  [phone]  Name: [name] MRN: [id number] Date of Birth: Mar 27, 1981

## 2019-02-23 NOTE — Patient Instructions (Signed)
Access Code: [email or web address]  URL: [email or web address]  Date: 02/23/2019  Prepared by: Rudell Cobb   Program Notes  Begin program having a family member stand nearby for safety. As you are able to perform without loss of balance, you can begin to try on your own.   Exercises Half Tandem Stance Balance with Eyes Closed - 3 reps - 1 sets - 30 hold - 1x daily - 7x weekly Wide Stance with Head Nods on Foam Pad - 10 reps - 2 sets - 2x daily - 7x weekly Wide Stance with Eyes Closed on Foam Pad - 3 reps - 1 sets - 30 seconds hold - 1x daily - 7x weekly Foot Taps on Steps of Various Heights and Placements (BKA) - 10 reps - 3 sets - 1x daily - 7x weekly Standing Marching - 10 reps - 1 sets - 1x daily - 7x weekly Romberg Stance on Foam Pad with Head Rotation - 10 reps - 2 sets - 1x daily - 7x weekly

## 2019-02-23 NOTE — Therapy (Signed)
[facility] [address] [place], [place], [address] Phone: [phone]   Fax:  [phone]  Physical Therapy Treatment  Patient Details  Name: [name] MRN: [id number] Date of Birth: 07/02/1981 Referring Provider (PT): Dr. [name] Penna   Encounter Date: 02/23/2019   Physical Therapy Telehealth Visit:  I connected with [name] (patient name) today at 82 (time) by [facility] video conference and verified that I am speaking with the correct person using two identifiers.  I discussed the limitations, risks, security and privacy concerns of performing an evaluation and management service by Webex and the availability of in person appointments.  I also discussed with the patient that there may be a patient responsible charge related to this service. The patient expressed understanding and agreed to proceed.   The patient's address was confirmed.  Identified to the patient that therapist is a licensed PT in the state of [place].  Verified phone # as [phone]  to call in case of technical difficulties.    PT End of Session - 02/23/19 0857    Visit Number  6    Number of Visits  17    Date for PT Re-Evaluation  03/21/19    Authorization Type  UHC    PT Start Time  0900    PT Stop Time  0950    PT Time Calculation (min)  50 min    Activity Tolerance  Patient tolerated treatment well    Behavior During Therapy  WFL for tasks assessed/performed       Past Medical History:  Diagnosis Date  . ADD (attention deficit disorder)   . Exercise-induced asthma   . Hx of varicella   . Kidney stones   . S/P cesarean section 05/05/2016  . S/P tubal ligation 05/05/2016  . Stroke ([facility])   . Term pregnancy, repeat 05/04/2016    Past Surgical History:  Procedure Laterality Date  . BACK SURGERY     scoliosis age 13  . BILATERAL SALPINGECTOMY Bilateral 05/05/2016   Procedure: BILATERAL SALPINGECTOMY;  Surgeon: Janyth Contes, MD;  Location: [facility];  Service: Obstetrics;  Laterality: Bilateral;  . CESAREAN SECTION    . CESAREAN SECTION N/A 05/05/2016   Procedure: CESAREAN SECTION;  Surgeon: Janyth Contes, MD;  Location: [facility];  Service: Obstetrics;  Laterality: N/A;  . CHOLECYSTECTOMY    . HAND SURGERY    . HERNIA REPAIR    . KIDNEY STONE SURGERY     R--surgery; L lithotripsy.  Dr. Gaynelle Arabian    Vitals:   02/23/19 0905 02/23/19 0945  BP: (!) 148/97 (!) 135/97    Subjective Assessment - 02/23/19 0856    Subjective  PT called the patient 5 minutes before appointment to ensure no difficulty downloading webex app.  The patient is not needing to hold walls while walking, she still notes some difficulty with fast/unplanned turns, but predictable turns more manageable.  She continues with dizziness when looking up.  *PT recommended patient avoid end range extension.    Pertinent History  CVA, ADHD/ADD, panic attacks, exercise induced asthma    Patient Stated Goals  "Using my Right side" - feels as if it is weaker and discoordinated    Currently in Pain?  No/denies   reported        [facility] PT Assessment - 02/23/19 0933      Standardized Balance Assessment   Standardized Balance Assessment  Berg Balance Test      [facility] Balance  Test   Sit to Stand  Able to stand without using hands and stabilize independently    Standing Unsupported  Able to stand safely 2 minutes    Sitting with Back Unsupported but Feet Supported on Floor or Stool  Able to sit safely and securely 2 minutes    Stand to Sit  Sits safely with minimal use of hands    Transfers  Able to transfer safely, minor use of hands    Standing Unsupported with Eyes Closed  Able to stand 10 seconds safely    Standing Unsupported with Feet Together  Able to place feet together independently and stand 1 minute safely    From Standing, Reach Forward with Outstretched Arm  Can reach confidently >25 cm (10")    From  Standing Position, Pick up Object from Floor  Able to pick up shoe, needs supervision    From Standing Position, Turn to Look Behind Over each Shoulder  Looks behind from both sides and weight shifts well    Turn 360 Degrees  Able to turn 360 degrees safely in 4 seconds or less    Standing Unsupported, Alternately Place Feet on Step/Stool  Able to complete >2 steps/needs minimal assist   did not re-test; used evaluation score   Standing Unsupported, One Foot in Front  Able to plae foot ahead of the other independently and hold 30 seconds    Standing on One Leg  Able to lift leg independently and hold equal to or more than 3 seconds    Total Score  49    Berg comment:  49/56, *this score was captured via video telehealth visit.  PT could not have patient perform alternating LE foot taps- used evaluation score.                     [facility] Adult PT Treatment/Exercise - 02/23/19 1013      Self-Care   Self-Care  Other Self-Care Comments    Other Self-Care Comments   Discussed how to record gait speed (Husband to video) for therapist to check on progress (measured at 2.99 ft/sec last visit in the clinic).      Neuro Re-ed    Neuro Re-ed Details   Standing in place with a chair in front of her and wall behind her.  Began with horizontal head turns 1/4 turns right and left sides.  Progressed to narrow base of support.  Then began with eyes closed head motion without turns with narrow base without UE support.  Progressed to 1/4 turns with one UE support x 5 reps each direction.  Patient feels better having the chair in front of her when turning 1/4 turn to the left due to confidence with balance.  The patient performed 1/2 tandem stance activities with eyes closed with increased sway and intermittent UE support needed.  With tandem stance + eyes open, patient has increased sway.  PT discussed HEP progression on foam slowly beginning to narrow stance.         Access Code: [email or web address]  URL:  [email or web address]  Date: 02/23/2019  Prepared by: Rudell Cobb   Program Notes  Begin program having a family member stand nearby for safety. As you are able to perform without loss of balance, you can begin to try on your own.   Exercises Half Tandem Stance Balance with Eyes Closed - 3 reps - 1 sets - 30 hold - 1x daily - 7x weekly Wide Stance with Head Nods on Foam  Pad - 10 reps - 2 sets - 2x daily - 7x weekly Wide Stance with Eyes Closed on Foam Pad - 3 reps - 1 sets - 30 seconds hold - 1x daily - 7x weekly Foot Taps on Steps of Various Heights and Placements (BKA) - 10 reps - 3 sets - 1x daily - 7x weekly Standing Marching - 10 reps - 1 sets - 1x daily - 7x weekly Romberg Stance on Foam Pad with Head Rotation - 10 reps - 2 sets - 1x daily - 7x weekly        PT Short Term Goals - 02/23/19 0865      PT SHORT TERM GOAL #1   Title  patient to be independent with initial HEP    Baseline  Patient not performing at recommended frequency.  Is able to return demo components.    Time  4    Period  Weeks    Status  Achieved    Target Date  02/14/19      PT SHORT TERM GOAL #2   Title  patient to improve gait speed with no AD to >/= 2.62 ft/sec    Baseline  2.22 ft/sec without device (1.9 ft/sec at eval)    Time  4    Period  Weeks    Status  Partially Met    Target Date  02/14/19      PT SHORT TERM GOAL #3   Title  patient to improve Berg to >/= 48/56 demonstrating improved balance    Baseline  49/56    Time  4    Period  Weeks    Status  Achieved    Target Date  02/14/19      PT SHORT TERM GOAL #4   Title  patient to improve FGA to >/= 16/30    Baseline  *unable to test on 4/3 visit (performed via telehealth).    Time  4    Period  Weeks    Status  On-going    Target Date  02/14/19      PT SHORT TERM GOAL #5   Title  patient to ambulate up/down 1 flight of steps with single to no handrail and reciprocal pattern    Baseline  *unable to test on  4/3 visit.    Time  4    Period  Weeks    Status  On-going    Target Date  02/14/19        PT Long Term Goals - 01/17/19 1621      PT LONG TERM GOAL #1   Title  patient to be independent with advanced HEP    Time  8    Period  Weeks    Status  New    Target Date  03/21/19      PT LONG TERM GOAL #2   Title  patient to improve Berg to >/= 52/56    Time  8    Period  Weeks    Status  New    Target Date  03/21/19      PT LONG TERM GOAL #3   Title  patient to improve FGA to >/= 20/30    Time  8    Period  Weeks    Status  New    Target Date  03/21/19      PT LONG TERM GOAL #4   Title  patient to demonstrate gait over various surfaces and inclines with Mod I without LOB with LRAD  for >/= 500 feet    Time  8    Period  Weeks    Status  New    Target Date  03/21/19      PT LONG TERM GOAL #5   Title  patient to improve gait speed to >/= 3.5 ft/sec    Time  8    Period  Weeks    Status  New    Target Date  03/21/19            Plan - 02/23/19 1010    Clinical Impression Statement  The patient met [facility] for Berg and for HEP.  Session was provided via telehealth due to changing clinic hours with COVID-19.  The patient's HEP was progressed, she is improving abilty to recover from loss of balance during turns and is continuing to progress balance per Berg score of 49/56.      PT Treatment/Interventions  ADLs/Self Care Home Management;Cryotherapy;Electrical Stimulation;Functional mobility training;Stair training;Gait training;DME Instruction;Moist Heat;Therapeutic activities;Therapeutic exercise;Balance training;Neuromuscular re-education;Patient/family education;Orthotic Fit/Training;Passive range of motion;Manual techniques;Taping;Vestibular    PT Next Visit Plan  Continue to monitor BP (patient has home cuff);  progress dynamic balance, progress R LE coordination activities, perform quadriped/ core strengthening.    Consulted and Agree with Plan of Care  Patient        Patient will benefit from skilled therapeutic intervention in order to improve the following deficits and impairments:  Abnormal gait, Decreased balance, Decreased mobility, Decreased strength, Decreased coordination, Difficulty walking, Decreased safety awareness  Visit Diagnosis: Other abnormalities of gait and mobility  Unsteadiness on feet  Other lack of coordination     Problem List Patient Active Problem List   Diagnosis Date Noted  . Vertebral artery dissection ([place])   . Elevated blood pressure reading   . Major depressive disorder   . Panic disorder   . CVA (cerebral vascular accident) ([facility]) 01/02/2019  . Acute ischemic stroke ([place]) 01/02/2019  . S/P cesarean section 05/05/2016  . S/P tubal ligation 05/05/2016  . Term pregnancy, repeat 05/04/2016  . Unspecified vitamin D deficiency 12/18/2012  . ADHD (attention deficit hyperactivity disorder) 11/26/2011  . Dysthymia 11/26/2011    Aldonia Keeven, PT 02/23/2019, 10:56 AM  [facility] [address] [place], [place], [address] Phone: [phone]   Fax:  [phone]  Name: [name] MRN: [id number] Date of Birth: May 30, 1981

## 2019-02-26 ENCOUNTER — Encounter: Payer: 59 | Admitting: Occupational Therapy

## 2019-02-26 ENCOUNTER — Ambulatory Visit: Payer: 59 | Admitting: Rehabilitation

## 2019-02-28 DIAGNOSIS — H538 Other visual disturbances: Secondary | ICD-10-CM | POA: Diagnosis not present

## 2019-02-28 DIAGNOSIS — I7774 Dissection of vertebral artery: Secondary | ICD-10-CM | POA: Diagnosis not present

## 2019-02-28 DIAGNOSIS — H532 Diplopia: Secondary | ICD-10-CM | POA: Diagnosis not present

## 2019-02-28 DIAGNOSIS — H518 Other specified disorders of binocular movement: Secondary | ICD-10-CM | POA: Diagnosis not present

## 2019-03-05 ENCOUNTER — Encounter: Payer: 59 | Admitting: Occupational Therapy

## 2019-03-05 ENCOUNTER — Ambulatory Visit: Payer: 59 | Admitting: Rehabilitation

## 2019-03-06 ENCOUNTER — Encounter: Payer: Self-pay | Admitting: Rehabilitative and Restorative Service Providers"

## 2019-03-06 ENCOUNTER — Ambulatory Visit: Payer: 59 | Admitting: Rehabilitative and Restorative Service Providers"

## 2019-03-06 VITALS — BP 126/86

## 2019-03-06 DIAGNOSIS — R2689 Other abnormalities of gait and mobility: Secondary | ICD-10-CM

## 2019-03-06 DIAGNOSIS — M6281 Muscle weakness (generalized): Secondary | ICD-10-CM

## 2019-03-06 DIAGNOSIS — R2681 Unsteadiness on feet: Secondary | ICD-10-CM

## 2019-03-06 DIAGNOSIS — R278 Other lack of coordination: Secondary | ICD-10-CM

## 2019-03-06 NOTE — Patient Instructions (Signed)
Updated medbridge HEP adding gaze x 1 viewing.

## 2019-03-06 NOTE — Therapy (Signed)
[facility] [address] [place], [place], [address] Phone: [phone]   Fax:  [phone]  Physical Therapy Treatment  Patient Details  Name: [name] MRN: [id number] Date of Birth: 1981/10/18 Referring Provider (PT): Dr. [name] Penna   Encounter Date: 03/06/2019  Physical Therapy Telehealth Visit:  I connected with Garry Heater today at 1007am by Webex video conference and verified that I am speaking with the correct person using two identifiers.  I discussed the limitations, risks, security and privacy concerns of performing an evaluation and management service by Webex and the availability of in person appointments.  I also discussed with the patient that there may be a patient responsible charge related to this service. The patient expressed understanding and agreed to proceed.    The patient's address was confirmed.  Identified to the patient that therapist is a licensed [place] in the state of [place].  Verified phone # as [phone] to call in case of technical difficulties.    PT End of Session - 03/06/19 1159    Visit Number  7    Number of Visits  17    Date for PT Re-Evaluation  03/21/19    Authorization Type  UHC  (23 visit limit)    Authorization - Visit Number  7    Authorization - Number of Visits  23    PT Start Time  1007    PT Stop Time  1050    PT Time Calculation (min)  43 min    Activity Tolerance  Patient tolerated treatment well    Behavior During Therapy  WFL for tasks assessed/performed       Past Medical History:  Diagnosis Date  . ADD (attention deficit disorder)   . Exercise-induced asthma   . Hx of varicella   . Kidney stones   . S/P cesarean section 05/05/2016  . S/P tubal ligation 05/05/2016  . Stroke ([facility])   . Term pregnancy, repeat 05/04/2016    Past Surgical History:  Procedure Laterality Date  . BACK SURGERY     scoliosis age 17  . BILATERAL SALPINGECTOMY  Bilateral 05/05/2016   Procedure: BILATERAL SALPINGECTOMY;  Surgeon: Janyth Contes, MD;  Location: [facility];  Service: Obstetrics;  Laterality: Bilateral;  . CESAREAN SECTION    . CESAREAN SECTION N/A 05/05/2016   Procedure: CESAREAN SECTION;  Surgeon: Janyth Contes, MD;  Location: [facility];  Service: Obstetrics;  Laterality: N/A;  . CHOLECYSTECTOMY    . HAND SURGERY    . HERNIA REPAIR    . KIDNEY STONE SURGERY     R--surgery; L lithotripsy.  Dr. Gaynelle Arabian    Vitals:   03/06/19 1158  BP: 126/86    Subjective Assessment - 03/06/19 1158    Subjective  Stacy reports that she saw Dr. Frederico Hamman and he wanted to wait on prism use to allow her body to continue correcting for visual deficits post stroke.    Pertinent History  CVA, ADHD/ADD, panic attacks, exercise induced asthma    Patient Stated Goals  "Using my Right side" - feels as if it is weaker and discoordinated    Currently in Pain?  No/denies    Pain Score  --   shoulder was sore yesterday, no pain noted today      NEUROMUSCULAR RE-EDUCATION: Quadriped position beginning with R and then L hip extension without difficulty.  Began alternating UE/LE contralateral extremities for core activiation x 10 reps. Tall kneeling near couch for support  performing transition to heel sit<>tall knee reaching overhead. Tall kneel<>1/2 kneeling dec'ing UE support on couch Coordination activities in tall kneeling tapping R foot working on controlling magnitude of movement R hip strengthening in 1/2 kneeling lifting/marching the left leg while stabilizing R hip   Seated coordination activities for LE emphasizing speed and control pressing through solo cup and moving between upside down cup and solo cup in seated position.  Patient inquired about driving.  She continues with occasional double at midline in vertical direction.  She denies dizziness with horizontal head turns and double vision, however notes things are  slower.     She began with horizontal head rotation gaze x 1 viewing with cues on ROM, speed and duration for HEP.  Modified in HEP/ medbridge to add to program.        Access Code: [email or web address]  URL: [email or web address]  Date: 03/06/2019  Prepared by: Rudell Cobb   Program Notes  Begin program having a family member stand nearby for safety. As you are able to perform without loss of balance, you can begin to try on your own.   Exercises Half Tandem Stance Balance with Eyes Closed - 3 reps - 1 sets - 30 hold - 1x daily - 7x weekly Wide Stance with Head Nods on Foam Pad - 10 reps - 2 sets - 2x daily - 7x weekly Wide Stance with Eyes Closed on Foam Pad - 3 reps - 1 sets - 30 seconds hold - 1x daily - 7x weekly Foot Taps on Steps of Various Heights and Placements (BKA) - 10 reps - 3 sets - 1x daily - 7x weekly Standing Marching - 10 reps - 1 sets - 1x daily - 7x weekly Romberg Stance on Foam Pad with Head Rotation - 10 reps - 2 sets - 1x daily - 7x weekly Standing Quarter Turn - 10 reps - 3 sets - 1x daily - 7x weekly Seated Gaze Stabilization with Head Rotation - 1 reps - 1 sets - 2-3x daily - 7x weekly      PT Short Term Goals - 02/23/19 9147      PT SHORT TERM GOAL #1   Title  patient to be independent with initial HEP    Baseline  Patient not performing at recommended frequency.  Is able to return demo components.    Time  4    Period  Weeks    Status  Achieved    Target Date  02/14/19      PT SHORT TERM GOAL #2   Title  patient to improve gait speed with no AD to >/= 2.62 ft/sec    Baseline  2.22 ft/sec without device (1.9 ft/sec at eval)    Time  4    Period  Weeks    Status  Partially Met    Target Date  02/14/19      PT SHORT TERM GOAL #3   Title  patient to improve Berg to >/= 48/56 demonstrating improved balance    Baseline  49/56    Time  4    Period  Weeks    Status  Achieved    Target Date  02/14/19      PT SHORT TERM GOAL #4    Title  patient to improve FGA to >/= 16/30    Baseline  *unable to test on 4/3 visit (performed via telehealth).    Time  4    Period  Weeks    Status  On-going  Target Date  02/14/19      PT SHORT TERM GOAL #5   Title  patient to ambulate up/down 1 flight of steps with single to no handrail and reciprocal pattern    Baseline  *unable to test on 4/3 visit.    Time  4    Period  Weeks    Status  On-going    Target Date  02/14/19        PT Long Term Goals - 01/17/19 1621      PT LONG TERM GOAL #1   Title  patient to be independent with advanced HEP    Time  8    Period  Weeks    Status  New    Target Date  03/21/19      PT LONG TERM GOAL #2   Title  patient to improve Berg to >/= 52/56    Time  8    Period  Weeks    Status  New    Target Date  03/21/19      PT LONG TERM GOAL #3   Title  patient to improve FGA to >/= 20/30    Time  8    Period  Weeks    Status  New    Target Date  03/21/19      PT LONG TERM GOAL #4   Title  patient to demonstrate gait over various surfaces and inclines with Mod I without LOB with LRAD for >/= 500 feet    Time  8    Period  Weeks    Status  New    Target Date  03/21/19      PT LONG TERM GOAL #5   Title  patient to improve gait speed to >/= 3.5 ft/sec    Time  8    Period  Weeks    Status  New    Target Date  03/21/19            Plan - 03/06/19 1208    Clinical Impression Statement  The patient continues to inquire about driving due to being a mom of 4 and wanting to increase her independence.  We discussed barriers as vision and coordination and will work on these tasks to help progress towards patient's goal.      PT Treatment/Interventions  ADLs/Self Care Home Management;Cryotherapy;Electrical Stimulation;Functional mobility training;Stair training;Gait training;DME Instruction;Moist Heat;Therapeutic activities;Therapeutic exercise;Balance training;Neuromuscular re-education;Patient/family education;Orthotic  Fit/Training;Passive range of motion;Manual techniques;Taping;Vestibular    PT Next Visit Plan  Continue to monitor BP (patient has home cuff);  progress dynamic balance, progress R LE coordination activities, perform quadriped/ core strengthening.  *PLAN to see via telehealth next week, then schedule the week after in clinic for dynamic training and goal assessment.    Consulted and Agree with Plan of Care  Patient       Patient will benefit from skilled therapeutic intervention in order to improve the following deficits and impairments:  Abnormal gait, Decreased balance, Decreased mobility, Decreased strength, Decreased coordination, Difficulty walking, Decreased safety awareness  Visit Diagnosis: Unsteadiness on feet  Other lack of coordination  Muscle weakness (generalized)  Other abnormalities of gait and mobility     Problem List Patient Active Problem List   Diagnosis Date Noted  . Vertebral artery dissection ([facility])   . Elevated blood pressure reading   . Major depressive disorder   . Panic disorder   . CVA (cerebral vascular accident) ([facility]) 01/02/2019  . Acute ischemic stroke ([facility]) 01/02/2019  .  S/P cesarean section 05/05/2016  . S/P tubal ligation 05/05/2016  . Term pregnancy, repeat 05/04/2016  . Unspecified vitamin D deficiency 12/18/2012  . ADHD (attention deficit hyperactivity disorder) 11/26/2011  . Dysthymia 11/26/2011    Kyndel Egger, PT 03/06/2019, 12:10 PM  [facility] [address] [place], [place], [address] Phone: [phone]   Fax:  [phone]  Name: KATHRYNNE KULINSKI MRN: [id number] Date of Birth: [birth year]

## 2019-03-08 ENCOUNTER — Encounter: Payer: 59 | Admitting: Occupational Therapy

## 2019-03-08 ENCOUNTER — Encounter: Payer: Self-pay | Admitting: Occupational Therapy

## 2019-03-08 ENCOUNTER — Ambulatory Visit: Payer: 59 | Admitting: Occupational Therapy

## 2019-03-08 ENCOUNTER — Ambulatory Visit: Payer: 59 | Admitting: Rehabilitative and Restorative Service Providers"

## 2019-03-08 DIAGNOSIS — R2689 Other abnormalities of gait and mobility: Secondary | ICD-10-CM

## 2019-03-08 DIAGNOSIS — R41842 Visuospatial deficit: Secondary | ICD-10-CM

## 2019-03-08 DIAGNOSIS — R278 Other lack of coordination: Secondary | ICD-10-CM

## 2019-03-08 DIAGNOSIS — M6281 Muscle weakness (generalized): Secondary | ICD-10-CM

## 2019-03-08 DIAGNOSIS — R2681 Unsteadiness on feet: Secondary | ICD-10-CM

## 2019-03-08 NOTE — Therapy (Signed)
[facility] [address] [place], [place], [address] Phone: [phone]   Fax:  [phone]  Occupational Therapy Treatment   Patient Details  Name: [name] MRN: [id number] Date of Birth: 1981/03/06 Referring Provider (OT): Dr. Letta Pate    Occupational Therapy Telehealth Visit:  I connected with Garry Heater  today at 10:05 by [facility] video conference and verified that I am speaking with the correct person using two identifiers.  I discussed the limitations, risks, security and privacy concerns of performing an evaluation and management service by Webex and the availability of in person appointments.   I also discussed with the patient that there may be a patient responsible charge related to this service. The patient expressed understanding and agreed to proceed.    The patient's address was confirmed.  Identified to the patient that therapist is a licensed occupational therapist in the state of [place].  Verified phone # as [phone] to call in case of technical difficulties.      Encounter Date: 03/08/2019  OT End of Session - 03/08/19 1109    Visit Number  7    Number of Visits  17    Date for OT Re-Evaluation  03/18/19    Authorization Type  UHC (23 visit limit for OT)    Authorization - Visit Number  7    Authorization - Number of Visits  23    OT Start Time  1005    OT Stop Time  1052    OT Time Calculation (min)  47 min    Activity Tolerance  Patient tolerated treatment well    Behavior During Therapy  WFL for tasks assessed/performed       Past Medical History:  Diagnosis Date  . ADD (attention deficit disorder)   . Exercise-induced asthma   . Hx of varicella   . Kidney stones   . S/P cesarean section 05/05/2016  . S/P tubal ligation 05/05/2016  . Stroke ([facility])   . Term pregnancy, repeat 05/04/2016    Past Surgical History:  Procedure Laterality Date  . BACK SURGERY     scoliosis age 16   . BILATERAL SALPINGECTOMY Bilateral 05/05/2016   Procedure: BILATERAL SALPINGECTOMY;  Surgeon: Janyth Contes, MD;  Location: [facility];  Service: Obstetrics;  Laterality: Bilateral;  . CESAREAN SECTION    . CESAREAN SECTION N/A 05/05/2016   Procedure: CESAREAN SECTION;  Surgeon: Janyth Contes, MD;  Location: [facility];  Service: Obstetrics;  Laterality: N/A;  . CHOLECYSTECTOMY    . HAND SURGERY    . HERNIA REPAIR    . KIDNEY STONE SURGERY     R--surgery; L lithotripsy.  Dr. Gaynelle Arabian    There were no vitals filed for this visit.  Subjective Assessment - 03/08/19 1108    Subjective   Pt reports that she is trying to use RUE more.  Pt reports that she still sees double in primary gaze.      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 PMH: ADHD, panic attacks, ex induced asthma    Limitations  **fall risk, no driving, diplopia    Currently in Pain?  No/denies        Reviewed diplopia HEP.  Instructed pt to emphasize merging images in primary gaze for 20-30sec at a time.  Pt able to do for a few seconds with effort.  Pt also to continue to perform individual eye tracking.  Pt  verbalized understanding.    Discussed progress with meal prep, child care, and home maintenance task (pt helping to put together entertainment center).  Pt reports that she is performing simple meal prep with no cutting (except with butter knife) with RUE and no lifting pans out of the oven that require 2 hands.  Recommended pt continue these restrictions due to decr balance and RUE control and educated pt in safe ways to incr RUE functional use in these tasks including using BUEs to assist with pouring.    Assessed writing as pt reports that this is difficult and pt is writing some with LUE instead of RUE.  Recommended pt force use of RUE for writing and try to move paper where she sees the best/decr diplopia (positioned  to the R).  Writing appears to be limited more by vision (difficulty staying on lines vs. Coordination).   Min cueing for R shoulder hike and recommended pt write with elbow on the table and use lined paper when writing at home.  Assessed functional reach (overhead), pt with noted shoulder elevation with reach (but full ROM).  Pt instructed to focus on scapular depression with reaching and improvement noted with cueing.  Added exercise to HEP:  Closed chain shoulder flexion with box to help with this.  Also reviewed that quadruped alternating UE/LE lifts provided by PT will help with proximal stability, which can help with ataxia and distal UE control/coordination.  Access Code: [email or web address]  URL: [email or web address]  Date: 03/08/2019  Prepared by: Vianne Bulls   Program Notes  Begin program having a family member stand nearby for safety. As you are able to perform without loss of balance, you can begin to try on your own.   Exercises --Added Seated Shoulder Flexion with Ball - 10 reps - 2 sets - 2 hold - 1x daily    Discussed driving per pt questions and educated pt that vision and RLE control/reaction time are primary limitations to driving safety.  Emphasized importance of continued HEP.  Pt is very motivated.        OT Short Term Goals - 03/08/19 1115      OT SHORT TERM GOAL #1   Title  Independent w/ coordination HEP - 02/15/19    Time  4    Period  Weeks    Status  Achieved      OT SHORT TERM GOAL #2   Title  Independent with diplopia HEP     Time  4    Period  Weeks    Status  Achieved      OT SHORT TERM GOAL #3   Title  Improve fine motor coordination Rt hand as evidenced by performing 9 hole peg test in 42 sec or under    Baseline  49.41 sec    Time  4    Period  Weeks    Status  Deferred   03/08/19:  unable to assess via telehealth     OT [place] #4   Title  Pt to make snack from standing level w/ supervision    Time  4    Period  Weeks     Status  Achieved      OT SHORT TERM GOAL #5   Title  Pt to perform light household tasks (laundry, Manufacturing engineer) w/ counter support x 10 min. w/o rest or LOB    Time  4    Period  Weeks    Status  Achieved  OT SHORT TERM GOAL #6   Title  Pt to read 1 line of enlarged text w/ improvements in diplopia per pt report    Period  Weeks    Status  Achieved        OT Long Term Goals - 03/08/19 1116      OT LONG TERM GOAL #1   Title  Independent with updated HEP prn - 03/18/19    Time  8    Period  Weeks    Status  New      OT LONG TERM GOAL #2   Title  Pt to return to light cooking tasks for 20 min. w/ distant supervision using RUE at least 90% of the time    Time  8    Period  Weeks    Status  Revised   03/08/19:  updated     OT LONG TERM GOAL #3   Title  Pt to return to moderately complex household tasks w/ distant supervision using RUE at least 90% of the time.    Time  8    Period  Weeks    Status  Revised   03/08/19:  updated.       OT LONG TERM GOAL #4   Title  Pt to improve grip strength Rt dominant hand by 10 lbs     Baseline  65 lbs    Time  8    Period  Weeks    Status  New      OT LONG TERM GOAL #5   Title  Pt to improve fine motor coordination Rt hand as evidenced by reducing speed on 9 hole peg test to 35 sec. or less    Baseline  49.41 sec    Time  8    Period  Weeks    Status  New      OT LONG TERM GOAL #6   Title  Pt able to read paragraph of text w/ minimal diplopia    Time  8    Period  Weeks    Status  New      OT LONG TERM GOAL #7   Title  Pt demo improved coordination to be able to pour liquid with RUE without spills.    Status  New            Plan - 03/08/19 1110    Clinical Impression Statement  Pt continues to show good progress with ADLs and balance.  Pt continues to report diplopia in primary gaze and is noted to compensate and able to make images one with head tilt or closes 1 eye initially when reading/focusing  on an object initially.  Discussed importance of not using head tilt all the time.   (see goal section for updates)    Occupational Profile and client history currently impacting functional performance  Pt is wife and mother of 4 children. Current deficits affect ability to perform ADLS, activities, life roles. PMH: ADHD, panic attacks, exercise induced asthma    Occupational performance deficits (Please refer to evaluation for details):  ADL's;IADL's;Leisure;Other    Body Structure / Function / Physical Skills  UE functional use;ROM;FMC;Mobility;Vision;Dexterity;Strength;Endurance;Coordination;IADL;Decreased knowledge of precautions;Decreased knowledge of use of DME    Rehab Potential  Good    Clinical Decision Making  Several treatment options, min-mod task modification necessary    OT Frequency  2x / week    OT Duration  8 weeks    OT Treatment/Interventions  Self-care/ADL training;Therapeutic exercise;Visual/perceptual remediation/compensation;Coping strategies  training;Neuromuscular education;Patient/family education;Therapeutic activities;Functional Mobility Training;DME and/or AE instruction;Passive range of motion    Plan  mid to high reach,  quadruped activities, coordination; check if questions about reading guide and visual HEP.  check on carrying 38 y.o., writing.    OT Home Exercise Plan  Education provided:  pt has coordination HEP, putty,  and diplopia HEP    Consulted and Agree with Plan of Care  Patient       Patient will benefit from skilled therapeutic intervention in order to improve the following deficits and impairments:     Visit Diagnosis: Other lack of coordination  Visuospatial deficit  Muscle weakness (generalized)  Other abnormalities of gait and mobility  Unsteadiness on feet    Problem List Patient Active Problem List   Diagnosis Date Noted  . Vertebral artery dissection ([facility])   . Elevated blood pressure reading   . Major depressive disorder   . Panic  disorder   . CVA (cerebral vascular accident) ([facility]) 01/02/2019  . Acute ischemic stroke ([facility]) 01/02/2019  . S/P cesarean section 05/05/2016  . S/P tubal ligation 05/05/2016  . Term pregnancy, repeat 05/04/2016  . Unspecified vitamin D deficiency 12/18/2012  . ADHD (attention deficit hyperactivity disorder) 11/26/2011  . Dysthymia 11/26/2011    [facility] 03/08/2019, 11:25 AM  [facility] [address] [place], [place], [address] Phone: [phone]   Fax:  [phone]  Name: MYLIAH MEDEL MRN: [id number] Date of Birth: 20-Apr-1981   Vianne Bulls, OTR/L [facility] [address] [place], [place]  [address] [phone] phone [phone] 03/08/19 11:25 AM

## 2019-03-12 ENCOUNTER — Ambulatory Visit: Payer: 59 | Admitting: Physical Medicine & Rehabilitation

## 2019-03-12 ENCOUNTER — Encounter: Payer: 59 | Admitting: Occupational Therapy

## 2019-03-12 ENCOUNTER — Encounter: Payer: 59 | Attending: Registered Nurse

## 2019-03-12 ENCOUNTER — Other Ambulatory Visit: Payer: Self-pay

## 2019-03-12 ENCOUNTER — Ambulatory Visit: Payer: 59 | Admitting: Rehabilitation

## 2019-03-12 ENCOUNTER — Ambulatory Visit: Payer: 59 | Admitting: Rehabilitative and Restorative Service Providers"

## 2019-03-12 VITALS — BP 137/83

## 2019-03-12 DIAGNOSIS — R2689 Other abnormalities of gait and mobility: Secondary | ICD-10-CM

## 2019-03-12 DIAGNOSIS — I7774 Dissection of vertebral artery: Secondary | ICD-10-CM | POA: Insufficient documentation

## 2019-03-12 DIAGNOSIS — F410 Panic disorder [episodic paroxysmal anxiety] without agoraphobia: Secondary | ICD-10-CM | POA: Insufficient documentation

## 2019-03-12 DIAGNOSIS — H9312 Tinnitus, left ear: Secondary | ICD-10-CM | POA: Insufficient documentation

## 2019-03-12 DIAGNOSIS — I10 Essential (primary) hypertension: Secondary | ICD-10-CM | POA: Insufficient documentation

## 2019-03-12 DIAGNOSIS — M6281 Muscle weakness (generalized): Secondary | ICD-10-CM

## 2019-03-12 DIAGNOSIS — R2681 Unsteadiness on feet: Secondary | ICD-10-CM

## 2019-03-12 NOTE — Therapy (Signed)
[facility] [address] [place], [place], [address] Phone: [phone]   Fax:  [phone]  Physical Therapy Treatment  Patient Details  Name: [name] MRN: [id number] Date of Birth: August 04, 1981 Referring Provider (PT): Dr. [name] Penna   Encounter Date: 03/12/2019  PT End of Session - 03/12/19 1207    Visit Number  8    Number of Visits  17    Date for PT Re-Evaluation  03/21/19    Authorization Type  [employer]  (23 visit limit)    Authorization - Visit Number  8    Authorization - Number of Visits  23    PT Start Time  1000    PT Stop Time  1043    PT Time Calculation (min)  43 min    Activity Tolerance  Patient tolerated treatment well    Behavior During Therapy  [facility] for tasks assessed/performed       Past Medical History:  Diagnosis Date  . ADD (attention deficit disorder)   . Exercise-induced asthma   . Hx of varicella   . Kidney stones   . S/P cesarean section 05/05/2016  . S/P tubal ligation 05/05/2016  . Stroke ([place])   . Term pregnancy, repeat 05/04/2016    Past Surgical History:  Procedure Laterality Date  . BACK SURGERY     scoliosis age 37  . BILATERAL SALPINGECTOMY Bilateral 05/05/2016   Procedure: BILATERAL SALPINGECTOMY;  Surgeon: Janyth Contes, MD;  Location: [place];  Service: Obstetrics;  Laterality: Bilateral;  . CESAREAN SECTION    . CESAREAN SECTION N/A 05/05/2016   Procedure: CESAREAN SECTION;  Surgeon: Janyth Contes, MD;  Location: [facility];  Service: Obstetrics;  Laterality: N/A;  . CHOLECYSTECTOMY    . HAND SURGERY    . HERNIA REPAIR    . KIDNEY STONE SURGERY     R--surgery; L lithotripsy.  Dr. Gaynelle Arabian    Vitals:   03/12/19 1005  BP: 137/83    Subjective Assessment - 03/12/19 1006    Subjective  The patient notes her BP has improved since med changes.    She has been having headaches worse around hormonal times.  She thinks they are  currently improving.    She does note R neck and shoulder discomfort and feels HAs are worse in the evening.  We discussed there could be a correlation in her hiking her R shoulder and muscle tightness that may also be a contributing factor for headaches.    Pertinent History  CVA, ADHD/ADD, panic attacks, exercise induced asthma    Patient Stated Goals  "Using my Right side" - feels as if it is weaker and discoordinated    Currently in Pain?  No/denies       Physical Therapy Telehealth Visit:  I connected with Garry Heater today at [facility] by Webex video conference and verified that I am speaking with the correct person using two identifiers.  I discussed the limitations, risks, security and privacy concerns of performing an evaluation and management service by Webex and the availability of in person appointments.  I also discussed with the patient that there may be a patient responsible charge related to this service. The patient expressed understanding and agreed to proceed.    The patient's address was confirmed.  Identified to the patient that therapist is a licensed physical therapist in the state of [place].  Verified phone # as *same as in epic* to call in case of technical difficulties.  NEUROMUSCULAR RE-EDUCATION: Standing near countertop in home for support patient performed feet together/ narrow base of support standing.  Performed lateral stepping R to midline while reaching with R UE x 5 reps, then stepping to L.  Patient then alternated R<>midline<>L with ability to perform with minimal postural correction.  Performed R posterior/diagonal stepping<>midline x 5 reps, then repeated to the left.  Then performed alternating x 5 reps to each side.   Heel walking along countertop x 6 feet x 4 reps, toe walking along countertop x 6 feet x 4 reps with greater instability.   Standing diagonal reaching activities moving L hand to R foot and then L diagonal following movement with her head.    Wide<>narrow marching without difficulty, slow marching with some unsteadiness noted *near counter but not holding. Stepping posteriorly into lunges R and L sides with intermittent UE support when stepping with R leg posteriorly.   Toe marching  Near suport surface.     SELF CARE/HOME MANAGEMENT: Discussed increasing walking program to improve endurance recommending 10 minutes 2x/day to start if tolerated.  Recommended the patient have family member present for walking the first 4-5 times on her road to ensure she is able to safely step off road, doesn't fatigue too quickly, etc.     THERAPEUTIC EXERCISE: Seated upper trap stretch with overpressure, levator stretch. Supine towel roll stretch *Added to HEP via medbridge to do prn for tightness.   Heel cord stretch R LE due to subjective reports of tightness.              PT Short Term Goals - 03/12/19 1207      PT SHORT TERM GOAL #1   Title  patient to be independent with initial HEP    Baseline  Patient not performing at recommended frequency.  Is able to return demo components.    Time  4    Period  Weeks    Status  Achieved    Target Date  02/14/19      PT SHORT TERM GOAL #2   Title  patient to improve gait speed with no AD to >/= 2.62 ft/sec    Baseline  2.22 ft/sec without device (1.9 ft/sec at eval)    Time  4    Period  Weeks    Status  Partially Met    Target Date  02/14/19      PT SHORT TERM GOAL #3   Title  patient to improve Berg to >/= 48/56 demonstrating improved balance    Baseline  49/56    Time  4    Period  Weeks    Status  Achieved    Target Date  02/14/19      PT SHORT TERM GOAL #4   Title  patient to improve FGA to >/= 16/30    Baseline  *unable to test on 4/3 visit (performed via telehealth).    Time  4    Period  Weeks    Status  On-going    Target Date  02/14/19      PT SHORT TERM GOAL #5   Title  patient to ambulate up/down 1 flight of steps with single to no handrail and reciprocal  pattern    Baseline  Discussed by telehealth 4/20 visit:  Patient is using a reciprocal pattern with one handrail at this time.    Time  4    Period  Weeks    Status  Achieved    Target Date  02/14/19        PT Long Term Goals - 01/17/19 1621      PT LONG TERM GOAL #1   Title  patient to be independent with advanced HEP    Time  8    Period  Weeks    Status  New    Target Date  03/21/19      PT LONG TERM GOAL #2   Title  patient to improve Berg to >/= 52/56    Time  8    Period  Weeks    Status  New    Target Date  03/21/19      PT LONG TERM GOAL #3   Title  patient to improve FGA to >/= 20/30    Time  8    Period  Weeks    Status  New    Target Date  03/21/19      PT LONG TERM GOAL #4   Title  patient to demonstrate gait over various surfaces and inclines with Mod I without LOB with LRAD for >/= 500 feet    Time  8    Period  Weeks    Status  New    Target Date  03/21/19      PT LONG TERM GOAL #5   Title  patient to improve gait speed to >/= 3.5 ft/sec    Time  8    Period  Weeks    Status  New    Target Date  03/21/19            Plan - 03/12/19 1209    Clinical Impression Statement  The patient subjectively reports improving gait/mobility.  She notes improved balance with shoes donned in the home.  She reports negotiating steps with a reciprocal pattern with one handrail meeting another STG.  PT and patient discussed her comfort with following up in the clinic next week in order to assess goals, perform compliant surface negotiation, and progress dynamic gait activities.  Also feel it will be beneficial to assess R neck musculature as indicated.    PT Treatment/Interventions  ADLs/Self Care Home Management;Cryotherapy;Electrical Stimulation;Functional mobility training;Stair training;Gait training;DME Instruction;Moist Heat;Therapeutic activities;Therapeutic exercise;Balance training;Neuromuscular re-education;Patient/family education;Orthotic  Fit/Training;Passive range of motion;Manual techniques;Taping;Vestibular    PT Next Visit Plan  Monitor BP; check goals (FGA, Berg, gait speed), compliant surface negotiation, dynamic balance, coordination with dual tasking.  Assess neck/ R muscular tightness.     Consulted and Agree with Plan of Care  Patient       Patient will benefit from skilled therapeutic intervention in order to improve the following deficits and impairments:  Abnormal gait, Decreased balance, Decreased mobility, Decreased strength, Decreased coordination, Difficulty walking, Decreased safety awareness  Visit Diagnosis: Muscle weakness (generalized)  Other abnormalities of gait and mobility  Unsteadiness on feet     Problem List Patient Active Problem List   Diagnosis Date Noted  . Vertebral artery dissection ([place])   . Elevated blood pressure reading   . Major depressive disorder   . Panic disorder   . CVA (cerebral vascular accident) ([place]) 01/02/2019  . Acute ischemic stroke ([place]) 01/02/2019  . S/P cesarean section 05/05/2016  . S/P tubal ligation 05/05/2016  . Term pregnancy, repeat 05/04/2016  . Unspecified vitamin D deficiency 12/18/2012  . ADHD (attention deficit hyperactivity disorder) 11/26/2011  . Dysthymia 11/26/2011    Mayola Mcbain, PT 03/12/2019, 12:13 PM  [facility] [address] [place], [place], [address] Phone:  [phone]   Fax:  [phone]  Name: [name] MRN: [id number] Date of Birth: [birth year]

## 2019-03-13 ENCOUNTER — Ambulatory Visit (HOSPITAL_BASED_OUTPATIENT_CLINIC_OR_DEPARTMENT_OTHER): Payer: 59 | Admitting: Physical Medicine & Rehabilitation

## 2019-03-13 ENCOUNTER — Other Ambulatory Visit: Payer: Self-pay

## 2019-03-13 ENCOUNTER — Encounter: Payer: Self-pay | Admitting: Physical Medicine & Rehabilitation

## 2019-03-13 DIAGNOSIS — I7774 Dissection of vertebral artery: Secondary | ICD-10-CM | POA: Diagnosis not present

## 2019-03-13 DIAGNOSIS — I69398 Other sequelae of cerebral infarction: Secondary | ICD-10-CM

## 2019-03-13 DIAGNOSIS — H539 Unspecified visual disturbance: Secondary | ICD-10-CM | POA: Diagnosis not present

## 2019-03-13 NOTE — Patient Instructions (Signed)
No driving until cleared by MD

## 2019-03-13 NOTE — Progress Notes (Signed)
Subjective:    Patient ID: [name], female    DOB: Mar 18, 1981, 38 y.o.   MRN: [id number]  HPI  38 year old female who developed symptoms of lethargy dizziness anxiety nausea headache and neck pain on 12/11/2018.  No falls or injuries reported prior to this time.  CT of the head and CT angiogram indicated evolving acute ischemic right cerebellar and bilateral posterior cerebral artery infarctions with involvement of bilateral occipital lobe and bilateral thalami. An acute right vertebral artery dissection was seen at the level of the right C1 transverse foramen.  Urine drug screen positive for amphetamine the patient has been prescribed Adderall.  After stabilization patient was transferred to inpatient rehabilitation.  She initially required mod assist to ambulate 250 feet with 2 person hand-held assistance.   She was discharged from inpatient rehab on 01/16/2019.  She is back at her home with her 4 children and her husband.        Eyesight still the biggest issue post stroke.    Walking without device  Needs to hold handrail on steps  Doing virtual visits with physical therapy and OT Will have live visit next week  Saw PCP for HTN, started on Hyzaar  No longer using shower chair  Had 1 fall on March 26, no injury  VIRTUAL VISIT - patient is at home  , call mobile [phone] (M) Pain Inventory Average Pain 0 Pain Right Now 0 My pain is no pain  In the last 24 hours, has pain interfered with the following? General activity 0 Relation with others 0 Enjoyment of life 0 What TIME of day is your pain at its worst? no pain Sleep (in general) Good  Pain is worse with: no pain Pain improves with: no pain Relief from Meds: no pain  Mobility walk without assistance ability to climb steps?  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  no  Physicians involved in your care Any changes since last visit?  no   Family History  Problem Relation Age of Onset   Cancer Mother    Cancer Maternal Aunt    Cancer Maternal Grandmother    Social History   Socioeconomic History   Marital status: Married    Spouse name: Not on file   Number of children: Not on file   Years of education: Not on file   Highest education level: Not on file  Occupational History   Not on file  Social Needs   Financial resource strain: Not on file   Food insecurity:    Worry: Not on file    Inability: Not on file   Transportation needs:    Medical: Not on file    Non-medical: Not on file  Tobacco Use   Smoking status: Never Smoker   Smokeless tobacco: Never Used  Substance and Sexual Activity   Alcohol use: No    Comment: occasional wine   Drug use: No   Sexual activity: Yes    Partners: Male    Birth control/protection: Surgical  Lifestyle   Physical activity:    Days per week: Not on file    Minutes per session: Not on file   Stress: Not on file  Relationships   Social connections:    Talks on phone: Not on file    Gets together: Not  on file    Attends religious service: Not on file    Active member of club or organization: Not on file    Attends meetings of clubs or organizations: Not on file    Relationship status: Not on file  Other Topics Concern   Not on file  Social History Narrative   Not on file   Past Surgical History:  Procedure Laterality Date   BACK SURGERY     scoliosis age 64   BILATERAL SALPINGECTOMY Bilateral 05/05/2016   Procedure: BILATERAL SALPINGECTOMY;  Surgeon: Janyth Contes, MD;  Location: [place];  Service: Obstetrics;  Laterality: Bilateral;   CESAREAN SECTION     CESAREAN SECTION N/A 05/05/2016   Procedure: CESAREAN SECTION;  Surgeon: Janyth Contes, MD;  Location: [facility];  Service: Obstetrics;   Laterality: N/A;   CHOLECYSTECTOMY     HAND SURGERY     HERNIA REPAIR     KIDNEY STONE SURGERY     R--surgery; L lithotripsy.  Dr. Gaynelle Arabian   Past Medical History:  Diagnosis Date   ADD (attention deficit disorder)    Exercise-induced asthma    Hx of varicella    Kidney stones    S/P cesarean section 05/05/2016   S/P tubal ligation 05/05/2016   Stroke [facility])    Term pregnancy, repeat 05/04/2016   There were no vitals taken for this visit.  Opioid Risk Score:   Fall Risk Score:  `1  Depression screen PHQ 2/9  Depression screen [facility] 2/9 03/13/2019 02/22/2019 02/05/2019 06/06/2017  Decreased Interest 0 0 0 0  Down, Depressed, Hopeless 0 0 0 0  PHQ - 2 Score 0 0 0 0   Review of Systems  Constitutional: Negative.   HENT: Negative.   Eyes: Negative.   Respiratory: Negative.   Cardiovascular: Negative.   Gastrointestinal: Negative.   Endocrine: Negative.   Genitourinary: Negative.   Musculoskeletal: Negative.   Skin: Negative.   Allergic/Immunologic: Negative.   Neurological: Negative.   Hematological: Negative.   Psychiatric/Behavioral: Negative.   All other systems reviewed and are negative.      Objective:   Physical Exam  Patient is alert and oriented. She responds appropriately to questions. She has no evidence of dysarthria or aphasia Remainder of exam is deferred secondary to tell the visit      Assessment & Plan:  1.  Right vertebral artery dissection she has residual diplopia.  She is approximately 3 months post infarct we discussed that this may still gradually resolve.  I would like to see the patient back in clinic in approximately 4 weeks No driving unless visual symptoms improve will reassess.  Patient will need to follow-up with neurology as well.  Continue aspirin 325 mg daily 2.  Hypertension follow-up with primary care physician Tele-visit duration 15 minutes

## 2019-03-15 ENCOUNTER — Ambulatory Visit: Payer: 59 | Admitting: Occupational Therapy

## 2019-03-15 ENCOUNTER — Other Ambulatory Visit: Payer: Self-pay

## 2019-03-15 ENCOUNTER — Encounter: Payer: 59 | Admitting: Occupational Therapy

## 2019-03-15 ENCOUNTER — Ambulatory Visit: Payer: 59 | Admitting: Rehabilitative and Restorative Service Providers"

## 2019-03-15 ENCOUNTER — Encounter: Payer: Self-pay | Admitting: Occupational Therapy

## 2019-03-15 VITALS — BP 134/84

## 2019-03-15 DIAGNOSIS — M6281 Muscle weakness (generalized): Secondary | ICD-10-CM

## 2019-03-15 DIAGNOSIS — R41842 Visuospatial deficit: Secondary | ICD-10-CM

## 2019-03-15 DIAGNOSIS — R2681 Unsteadiness on feet: Secondary | ICD-10-CM

## 2019-03-15 DIAGNOSIS — R2689 Other abnormalities of gait and mobility: Secondary | ICD-10-CM | POA: Diagnosis not present

## 2019-03-15 DIAGNOSIS — R278 Other lack of coordination: Secondary | ICD-10-CM

## 2019-03-15 NOTE — Therapy (Addendum)
[facility] [address] [place], [place], [address] Phone: [phone]   Fax:  [phone]  Occupational Therapy Treatment  Patient Details  Name: [name] MRN: [id number] Date of Birth: 1981/09/15 Referring Provider (OT): Dr. Letta Pate   Occupational Therapy Telehealth Visit:  I connected with Garry Heater today at 10:04 by [facility] video conference and verified that I am speaking with the correct person using two identifiers.  I discussed the limitations, risks, security and privacy concerns of performing an evaluation and management service by Webex and the availability of in person appointments.  I also discussed with the patient that there may be a patient responsible charge related to this service. The patient expressed understanding and agreed to proceed.    The patient's address was confirmed.  Identified to the patient that therapist is a licensed occupational therapist in the state of [place].  Verified phone # as [phone] to call in case of technical difficulties.      Encounter Date: 03/15/2019  OT End of Session - 03/15/19 1108    Visit Number  8    Number of Visits  17    Date for OT Re-Evaluation  03/18/19    Authorization Type  [employer] (23 visit limit for OT)    Authorization - Visit Number  8    Authorization - Number of Visits  23    OT Start Time  1005    OT Stop Time  1050    OT Time Calculation (min)  45 min    Activity Tolerance  Patient tolerated treatment well    Behavior During Therapy  WFL for tasks assessed/performed       Past Medical History:  Diagnosis Date  . ADD (attention deficit disorder)   . Exercise-induced asthma   . Hx of varicella   . Kidney stones   . S/P cesarean section 05/05/2016  . S/P tubal ligation 05/05/2016  . Stroke ([place])   . Term pregnancy, repeat 05/04/2016    Past Surgical History:  Procedure Laterality Date  . BACK SURGERY     scoliosis age 65  .  BILATERAL SALPINGECTOMY Bilateral 05/05/2016   Procedure: BILATERAL SALPINGECTOMY;  Surgeon: Janyth Contes, MD;  Location: [facility];  Service: Obstetrics;  Laterality: Bilateral;  . CESAREAN SECTION    . CESAREAN SECTION N/A 05/05/2016   Procedure: CESAREAN SECTION;  Surgeon: Janyth Contes, MD;  Location: [facility];  Service: Obstetrics;  Laterality: N/A;  . CHOLECYSTECTOMY    . HAND SURGERY    . HERNIA REPAIR    . KIDNEY STONE SURGERY     R--surgery; L lithotripsy.  Dr. Gaynelle Arabian    Vitals:   03/15/19 1006  BP: 134/84    Subjective Assessment - 03/15/19 1104    Subjective   Pt reports that handwriting is improving.  Pt reports that she is able to merge images in primary gaze (positioned closer to her) for at least 30sec.  Pt reports that she is able to pick up 38 y.o. and carry for short distance.    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 PMH: ADHD, panic attacks, ex induced asthma    Limitations  **fall risk, no driving, diplopia    Currently in Pain?  No/denies       Discussed PT recommendation that pt walk outside on the road.  Pt has not attempted yet, only driveway.  Pt plans to walk  with older child initially while youngest stays at home.  Recommended pt consider walking with stroller with 36 y.o. (if 36 y.o. willing to sit) to provide incr stability with stroller, but recommended that pt perform with older child to assist first.    Working with letter "E" card, pt cued to position head at midline and focus to merge images.   Then worked to slowly move card away small distance to where it starts to double and try to focus to make images one.  Also, worked on tracking horizontally (without significant difficulty at preferred distance), but pt with significant difficulty vertically.  Pt demo difficulty with inferior tracking, therefore, pt to slowly track inferiorly until it starts to  double and focus to merge images at this point.   Pt is able to do so with min v.c. for head tilt (to the L and down).    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  Educated pt to position laptop/tablet further away from her and elevated to improve positioning as this is where she is able to merge images and decr compensation and to check these patterns if she is experiencing pain.  Pt verbalized understanding.  Pt reports no questions about reading guides.     (Pt performed the following exercises/added to HEP):    Access Code: [email or web address]  URL: [email or web address]  Date: 03/15/2019  Prepared by: Vianne Bulls   Program Notes  Begin program having a family member stand nearby for safety. As you are able to perform without loss of balance, you can begin to try on your own.   Exercises  Quadruped Cat Camel - 10 reps - 1 sets - 5 hold - 1x daily  Quadruped Rocking Slow - 10 reps - 5 hold - 1x daily  Bird Dog - 10 reps - 5-10sec hold - 1x daily                 OT Education - 03/15/19 1106    Education Details  Progression of visual HEP.  Quadruped HEP for incr scapular stability/depression from [place] (see note for details).    Person(s) Educated  Patient    Methods  Explanation;Demonstration;Handout;Verbal cues   emailed medbridge link   Comprehension  Verbalized understanding;Returned demonstration;Verbal cues required       OT Short Term Goals - 03/08/19 1115      OT SHORT TERM GOAL #1   Title  Independent w/ coordination HEP - 02/15/19    Time  4    Period  Weeks    Status  Achieved      OT SHORT TERM GOAL #2   Title  Independent with diplopia HEP      Time  4    Period  Weeks    Status  Achieved      OT SHORT TERM GOAL #3   Title  Improve fine motor coordination Rt hand as evidenced by performing 9 hole peg test in 42 sec or under    Baseline  49.41 sec    Time  4    Period  Weeks    Status  Deferred   03/08/19:  unable to assess via telehealth     OT Anton #4  Title  Pt to make snack from standing level w/ supervision    Time  4    Period  Weeks    Status  Achieved      OT SHORT TERM GOAL #5   Title  Pt to perform light household tasks (laundry, Manufacturing engineer) w/ counter support x 10 min. w/o rest or LOB    Time  4    Period  Weeks    Status  Achieved      OT SHORT TERM GOAL #6   Title  Pt to read 1 line of enlarged text w/ improvements in diplopia per pt report    Period  Weeks    Status  Achieved        OT Long Term Goals - 03/08/19 1116      OT LONG TERM GOAL #1   Title  Independent with updated HEP prn - 03/18/19    Time  8    Period  Weeks    Status  New      OT LONG TERM GOAL #2   Title  Pt to return to light cooking tasks for 20 min. w/ distant supervision using RUE at least 90% of the time    Time  8    Period  Weeks    Status  Revised   03/08/19:  updated     OT LONG TERM GOAL #3   Title  Pt to return to moderately complex household tasks w/ distant supervision using RUE at least 90% of the time.    Time  8    Period  Weeks    Status  Revised   03/08/19:  updated.       OT LONG TERM GOAL #4   Title  Pt to improve grip strength Rt dominant hand by 10 lbs     Baseline  65 lbs    Time  8    Period  Weeks    Status  New      OT LONG TERM GOAL #5   Title  Pt to improve fine motor coordination Rt hand as evidenced by reducing speed on 9 hole peg test to 35 sec. or less    Baseline  49.41 sec    Time  8    Period  Weeks    Status  New      OT LONG TERM GOAL #6   Title  Pt able to read paragraph of text w/ minimal diplopia    Time  8    Period  Weeks    Status  New       OT LONG TERM GOAL #7   Title  Pt demo improved coordination to be able to pour liquid with RUE without spills.    Status  New            Plan - 03/15/19 1108    Clinical Impression Statement  Pt continues to demo compensation patterns for vision and RUE functional use, which may be contributing to headache/neck pain (PT educated in exercises, which help as well); however, decr overall with less R eye closing during session.  Pt reports incr RUE functional use and is able to carry 38 y.o. short distance.  Pt continues to make good progress toward goals.    Occupational Profile and client history currently impacting functional performance  Pt is wife and mother of 4 children. Current deficits affect ability to perform ADLS, activities, life roles. PMH: ADHD, panic attacks, exercise induced asthma  Occupational performance deficits (Please refer to evaluation for details):  ADL's;IADL's;Leisure;Other    Body Structure / Function / Physical Skills  UE functional use;ROM;FMC;Mobility;Vision;Dexterity;Strength;Endurance;Coordination;IADL;Decreased knowledge of precautions;Decreased knowledge of use of DME    Rehab Potential  Good    Clinical Decision Making  Several treatment options, min-mod task modification necessary    OT Frequency  2x / week    OT Duration  8 weeks    OT Treatment/Interventions  Self-care/ADL training;Therapeutic exercise;Visual/perceptual remediation/compensation;Coping strategies training;Neuromuscular education;Patient/family education;Therapeutic activities;Functional Mobility Training;DME and/or AE instruction;Passive range of motion    Plan  see in clinic next week; check and update goals/renew.  check/progess HEP for RUE control/reach, environmental scanning (avoid head/eye compensation positions to decr pain), RUE control/coordination and safety with ambulation.    OT Home Exercise Plan  Education provided:  pt has coordination HEP, putty,  and diplopia HEP.      Medbridge Access Code:  [email or web address]     Consulted and Agree with Plan of Care  Patient       Patient will benefit from skilled therapeutic intervention in order to improve the following deficits and impairments:     Visit Diagnosis: Muscle weakness (generalized)  Visuospatial deficit  Other lack of coordination  Unsteadiness on feet  Other abnormalities of gait and mobility    Problem List Patient Active Problem List   Diagnosis Date Noted  . Vertebral artery dissection ([place])   . Elevated blood pressure reading   . Major depressive disorder   . Panic disorder   . CVA (cerebral vascular accident) ([place]) 01/02/2019  . Acute ischemic stroke ([place]) 01/02/2019  . S/P cesarean section 05/05/2016  . S/P tubal ligation 05/05/2016  . Term pregnancy, repeat 05/04/2016  . Unspecified vitamin D deficiency 12/18/2012  . ADHD (attention deficit hyperactivity disorder) 11/26/2011  . Dysthymia 11/26/2011    [facility] 03/15/2019, 2:18 PM  [facility] [address] [place], [place], [address] Phone: [phone]   Fax:  [phone]  Name: IZUMI MIXON MRN: [id number] Date of Birth: 03/19/1981   Vianne Bulls, OTR/L [facility] [address] [place], [place]  [address] [phone] phone [phone] 03/15/19 2:18 PM

## 2019-03-23 ENCOUNTER — Other Ambulatory Visit: Payer: Self-pay

## 2019-03-23 ENCOUNTER — Ambulatory Visit: Payer: 59 | Admitting: Rehabilitative and Restorative Service Providers"

## 2019-03-23 ENCOUNTER — Ambulatory Visit: Payer: 59 | Admitting: Occupational Therapy

## 2019-03-23 ENCOUNTER — Encounter: Payer: Self-pay | Admitting: Rehabilitative and Restorative Service Providers"

## 2019-03-23 DIAGNOSIS — I10 Essential (primary) hypertension: Secondary | ICD-10-CM | POA: Diagnosis present

## 2019-03-23 DIAGNOSIS — R2689 Other abnormalities of gait and mobility: Secondary | ICD-10-CM

## 2019-03-23 DIAGNOSIS — R41842 Visuospatial deficit: Secondary | ICD-10-CM

## 2019-03-23 DIAGNOSIS — R2681 Unsteadiness on feet: Secondary | ICD-10-CM

## 2019-03-23 DIAGNOSIS — H9312 Tinnitus, left ear: Secondary | ICD-10-CM | POA: Diagnosis present

## 2019-03-23 DIAGNOSIS — F410 Panic disorder [episodic paroxysmal anxiety] without agoraphobia: Secondary | ICD-10-CM | POA: Diagnosis present

## 2019-03-23 DIAGNOSIS — M6281 Muscle weakness (generalized): Secondary | ICD-10-CM | POA: Insufficient documentation

## 2019-03-23 DIAGNOSIS — R278 Other lack of coordination: Secondary | ICD-10-CM

## 2019-03-23 DIAGNOSIS — I7774 Dissection of vertebral artery: Secondary | ICD-10-CM | POA: Diagnosis not present

## 2019-03-23 NOTE — Therapy (Signed)
[facility] [address] [place], [place], [address] Phone: [phone]   Fax:  [phone]  Occupational Therapy Treatment  Patient Details  Name: [name] MRN: [id number] Date of Birth: Jun 23, 1981 Referring Provider (OT): Dr. Letta Pate   Encounter Date: 03/23/2019  OT End of Session - 03/23/19 0936    Visit Number  9    Number of Visits  17    Date for OT Re-Evaluation  03/18/19    Authorization Type  U[employer] (23 visit limit for OT)    Authorization - Visit Number  9    Authorization - Number of Visits  23    OT Start Time  0934    OT Stop Time  1015    OT Time Calculation (min)  41 min    Activity Tolerance  Patient tolerated treatment well    Behavior During Therapy  [facility] for tasks assessed/performed       Past Medical History:  Diagnosis Date  . ADD (attention deficit disorder)   . Exercise-induced asthma   . Hx of varicella   . Kidney stones   . S/P cesarean section 05/05/2016  . S/P tubal ligation 05/05/2016  . Stroke ([place])   . Term pregnancy, repeat 05/04/2016    Past Surgical History:  Procedure Laterality Date  . BACK SURGERY     scoliosis age 62  . BILATERAL SALPINGECTOMY Bilateral 05/05/2016   Procedure: BILATERAL SALPINGECTOMY;  Surgeon: Janyth Contes, MD;  Location: [facility];  Service: Obstetrics;  Laterality: Bilateral;  . CESAREAN SECTION    . CESAREAN SECTION N/A 05/05/2016   Procedure: CESAREAN SECTION;  Surgeon: Janyth Contes, MD;  Location: [facility];  Service: Obstetrics;  Laterality: N/A;  . CHOLECYSTECTOMY    . HAND SURGERY    . HERNIA REPAIR    . KIDNEY STONE SURGERY     R--surgery; L lithotripsy.  Dr. Gaynelle Arabian    There were no vitals filed for this visit.  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 PMH: ADHD, panic attacks, ex induced  asthma    Limitations  **fall risk, no driving, diplopia    Currently in Pain?  No/denies             Treatment:[facility] operating at a low capacity due to COVID-19. The patient was brought into the clinic for evaluation and/or treatment following universal masking by staff, social distancing, and <10 people in the clinic. The patient's COVID risk of complications score is 1 Therapist reviewed updated HEP with patient. Therapist checked progress towards goals and discussed with patient as well as updated goals that pt. Wants to achieve. Pt carried a 30# bag of weights, 125 feet without drops to simulate carrying her 4 y.o child. Pt reports performing without significant difficulty. Pt amb with water in left and hand plate of food in right hand without spills, however when water was transferred to RUE, pt spilled.                OT Education - 03/23/19 1723    Education Details  reviewed [facility] HEP and discussed pt progress and goals that pt wants to achieve.    Person(s) Educated  Patient    Methods  Explanation;Demonstration;Handout;Verbal cues    Comprehension  Verbalized understanding;Returned demonstration;Verbal cues required       OT Short Term Goals -  03/23/19 1045      OT SHORT TERM GOAL #1   Title  Independent w/ coordination HEP - 02/15/19    Time  4    Period  Weeks    Status  Achieved      OT SHORT TERM GOAL #2   Title  Independent with diplopia HEP     Time  4    Period  Weeks    Status  Achieved      OT SHORT TERM GOAL #3   Title  Improve fine motor coordination Rt hand as evidenced by performing 9 hole peg test in 42 sec or under    Baseline  49.41 sec    Time  6    Period  Weeks    Status  On-going   03/23/19 1 min 17 secs     OT SHORT TERM GOAL #4   Title  Pt to make snack from standing level w/ supervision    Time  4    Period  Weeks    Status  Achieved      OT SHORT TERM GOAL #5   Title  Pt to  perform light household tasks (laundry, Manufacturing engineer) w/ counter support x 10 min. w/o rest or LOB    Time  4    Period  Weeks    Status  Achieved      OT SHORT TERM GOAL #6   Title  Pt to read 1 line of enlarged text w/ improvements in diplopia per pt report    Period  Weeks    Status  Achieved      OT SHORT TERM GOAL #7   Title  Pt will report using RUE to assist with ADLs/Functional activity at least 75% of the time.    Baseline  currently using RUE 50% of the time    Time  6    Period  Weeks    Status  New      OT SHORT TERM GOAL #8   Title  Pt will report diplopia with functional activities and environmental scanning 25% or less of the time.    Baseline  currently diplopia 40% of the time with functional activity    Time  6    Period  Weeks    Status  New      OT SHORT TERM GOAL  #9   TITLE  Pt will demonstrate ability to write a short paragraph in a reasonable maount of time with 100% legibility    Baseline  Writes a sentence with 100% legibility and increased time    Time  6    Period  Weeks    Status  New        OT Long Term Goals - 03/23/19 1055      OT LONG TERM GOAL #1   Title  Independent with updated HEP prn - 03/18/19    Time  8    Period  Weeks    Status  Achieved      OT LONG TERM GOAL #2   Title  Pt to return to light cooking tasks for 20 min. w/ distant supervision using RUE at least 90% of the time    Time  12    Period  Weeks    Status  On-going   03/23/19 , uses RUE 50% of time     OT LONG TERM GOAL #3   Title  Pt to return to moderately complex household tasks w/ distant  supervision using RUE at least 90% of the time.    Time  12    Period  Weeks    Status  Revised   03/23/19 uses RUE 75% of the time     OT LONG TERM GOAL #4   Title  Pt to improve grip strength Rt dominant hand by 10 lbs     Baseline  65 lbs    Time  12    Period  Weeks    Status  On-going   68, 76, 64- improved but not consistent     OT LONG TERM GOAL  #5   Title  Pt to improve fine motor coordination Rt hand as evidenced by reducing speed on 9 hole peg test to 35 sec. or less    Baseline  49.41 sec    Time  12    Period  Weeks    Status  On-going   1 min 17 secs     Long Term Additional Goals   Additional Long Term Goals  Yes      OT LONG TERM GOAL #6   Title  Pt able to read paragraph of text w/ minimal diplopia    Time  8    Period  Weeks    Status  Achieved   Pt is able to position her head so that she does not have diplopia     OT LONG TERM GOAL #7   Title  Pt demo improved coordination to be able to pour liquid with RUE without spills.    Status  Achieved      OT LONG TERM GOAL #8   Title  Pt will demonstrate ability to ambulate while carrying a beverage in RUE without spilling.    Baseline  unable to carry a beverage in RUE without spills.    Time  12    Period  Weeks    Status  New      OT LONG TERM GOAL  #9   TITLE  Pt will demonstrate adequate RUE contol to retrieve a 5 lbs weight from mid level shelf with good control.    Time  12    Period  Weeks    Status  New            Plan - 03/23/19 1038    Clinical Impression Statement  Pt is progressing towards goals. Pt remains limited by RUE ataxia and decreased coordination, diplopia, decreased RUE strength and control which impedes perfromance of ADLS/IADLs. Pt can benefit from skilled occupational therapy to maximize pt's safety with ADLs and IADLs and to maintain quality of life.    Occupational Profile and client history currently impacting functional performance  Pt is wife and mother of 4 children. Current deficits affect ability to perform ADLS, activities, life roles. PMH: ADHD, panic attacks, exercise induced asthma    Occupational performance deficits (Please refer to evaluation for details):  ADL's;IADL's;Leisure;Other    Body Structure / Function / Physical Skills  UE functional  use;ROM;FMC;Mobility;Vision;Dexterity;Strength;Endurance;Coordination;IADL;Decreased knowledge of precautions;Decreased knowledge of use of DME    Rehab Potential  Good    OT Frequency  --   14 visits over 12 weeks, currently seeing 1x week   OT Duration  12 weeks    OT Treatment/Interventions  Self-care/ADL training;Therapeutic exercise;Visual/perceptual remediation/compensation;Coping strategies training;Neuromuscular education;Patient/family education;Therapeutic activities;Functional Mobility Training;DME and/or AE instruction;Passive range of motion;Aquatic Therapy;Moist Heat;Fluidtherapy;Balance training;Gait Training;Cryotherapy;Energy conservation;Manual Therapy    Plan  Continue to work towards updated goals, address RUE strength  and control, visual perceptual skills-environmental scanning    OT Home Exercise Plan  Education provided:  pt has coordination HEP, putty,  and diplopia HEP.     Medbridge Access Code:  [email or web address]     Consulted and Agree with Plan of Care  Patient       Patient will benefit from skilled therapeutic intervention in order to improve the following deficits and impairments:  Body Structure / Function / Physical Skills  Visit Diagnosis: Muscle weakness (generalized)  Unsteadiness on feet  Visuospatial deficit  Other lack of coordination  Other abnormalities of gait and mobility    Problem List Patient Active Problem List   Diagnosis Date Noted  . Vertebral artery dissection ([place])   . Elevated blood pressure reading   . Major depressive disorder   . Panic disorder   . CVA (cerebral vascular accident) ([place]) 01/02/2019  . Acute ischemic stroke ([place]) 01/02/2019  . S/P cesarean section 05/05/2016  . S/P tubal ligation 05/05/2016  . Term pregnancy, repeat 05/04/2016  . Unspecified vitamin D deficiency 12/18/2012  . ADHD (attention deficit hyperactivity disorder) 11/26/2011  . Dysthymia 11/26/2011    RINE,KATHRYN 03/23/2019, 5:24 PM  [facility] [address] [place], [place], [address] Phone: [phone]   Fax:  [phone]  Name: [name] MRN: [id number] Date of Birth: 1981-06-11

## 2019-03-24 NOTE — Therapy (Signed)
[facility] [address] [place], [place], [address] Phone: [phone]   Fax:  [phone]  Physical Therapy Treatment and UPDATED PHYSICAL THERAPY GOALS  Patient Details  Name: [name] MRN: [id number] Date of Birth: [birth year] Referring Provider (PT): Dr. [name] Penna   Encounter Date: 03/23/2019  PT End of Session - 03/23/19 1023    Visit Number  9    Number of Visits  17    Date for PT Re-Evaluation  03/21/19    Authorization Type  [employer]  (23 visit limit)    Authorization - Visit Number  9    Authorization - Number of Visits  23    PT Start Time  1020    PT Stop Time  1100    PT Time Calculation (min)  40 min    Activity Tolerance  Patient tolerated treatment well    Behavior During Therapy  WFL for tasks assessed/performed       Past Medical History:  Diagnosis Date  . ADD (attention deficit disorder)   . Exercise-induced asthma   . Hx of varicella   . Kidney stones   . S/P cesarean section 05/05/2016  . S/P tubal ligation 05/05/2016  . Stroke ([facility])   . Term pregnancy, repeat 05/04/2016    Past Surgical History:  Procedure Laterality Date  . BACK SURGERY     scoliosis age 61  . BILATERAL SALPINGECTOMY Bilateral 05/05/2016   Procedure: BILATERAL SALPINGECTOMY;  Surgeon: Janyth Contes, MD;  Location: [facility];  Service: Obstetrics;  Laterality: Bilateral;  . CESAREAN SECTION    . CESAREAN SECTION N/A 05/05/2016   Procedure: CESAREAN SECTION;  Surgeon: Janyth Contes, MD;  Location: [facility];  Service: Obstetrics;  Laterality: N/A;  . CHOLECYSTECTOMY    . HAND SURGERY    . HERNIA REPAIR    . KIDNEY STONE SURGERY     R--surgery; L lithotripsy.  Dr. Gaynelle Arabian     CLINIC OPERATION CHANGES: [facility] is operating at a low capacity due to COVID-19.  The patient was brought into the clinic for evaluation and/or treatment following  universal masking by staff, social distancing, and <10 people in the clinic.  The patient's COVID risk of complications score is 1.  There were no vitals filed for this visit.  Subjective Assessment - 03/23/19 1022    Subjective  The patient is happy to be out of her house today.  "I just want to drive."    Pertinent History  CVA, ADHD/ADD, panic attacks, exercise induced asthma    Patient Stated Goals  "Using my Right side" - feels as if it is weaker and discoordinated    Currently in Pain?  No/denies         [facility] PT Assessment - 03/23/19 1025      Ambulation/Gait   Ambulation/Gait  Yes    Ambulation/Gait Assistance  6: Modified independent (Device/Increase time);5: Supervision;4: Min guard    Gait velocity  2.72 ft/sec      Berg Balance Test   Sit to Stand  Able to stand without using hands and stabilize independently    Standing Unsupported  Able to stand safely 2 minutes    Sitting with Back Unsupported but Feet Supported on Floor or Stool  Able to sit safely and securely 2 minutes    Stand to Sit  Sits safely with minimal use of hands    Transfers  Able to transfer safely, minor use of  hands    Standing Unsupported with Eyes Closed  Able to stand 10 seconds safely    Standing Unsupported with Feet Together  Able to place feet together independently and stand 1 minute safely    From Standing, Reach Forward with Outstretched Arm  Can reach confidently >25 cm (10")    From Standing Position, Pick up Object from Floor  Able to pick up shoe safely and easily    From Standing Position, Turn to Look Behind Over each Shoulder  Looks behind from both sides and weight shifts well    Turn 360 Degrees  Able to turn 360 degrees safely in 4 seconds or less    Standing Unsupported, Alternately Place Feet on Step/Stool  Able to complete 4 steps without aid or supervision    Standing Unsupported, One Foot in Front  Able to plae foot ahead of the other independently and hold 30 seconds     Standing on One Leg  Able to lift leg independently and hold 5-10 seconds    Total Score  52    Berg comment:  52/56, improved from 43/56.      Functional Gait  Assessment   Gait assessed   Yes    Gait Level Surface  Walks 20 ft in less than 7 sec but greater than 5.5 sec, uses assistive device, slower speed, mild gait deviations, or deviates 6-10 in outside of the 12 in walkway width.    Change in Gait Speed  Able to change speed, demonstrates mild gait deviations, deviates 6-10 in outside of the 12 in walkway width, or no gait deviations, unable to achieve a major change in velocity, or uses a change in velocity, or uses an assistive device.    Gait with Horizontal Head Turns  Performs head turns smoothly with slight change in gait velocity (eg, minor disruption to smooth gait path), deviates 6-10 in outside 12 in walkway width, or uses an assistive device.    Gait with Vertical Head Turns  Performs task with slight change in gait velocity (eg, minor disruption to smooth gait path), deviates 6 - 10 in outside 12 in walkway width or uses assistive device    Gait and Pivot Turn  Pivot turns safely within 3 sec and stops quickly with no loss of balance.    Step Over Obstacle  Is able to step over one shoe box (4.5 in total height) but must slow down and adjust steps to clear box safely. May require verbal cueing.    Gait with Narrow Base of Support  Ambulates less than 4 steps heel to toe or cannot perform without assistance.    Gait with Eyes Closed  Walks 20 ft, uses assistive device, slower speed, mild gait deviations, deviates 6-10 in outside 12 in walkway width. Ambulates 20 ft in less than 9 sec but greater than 7 sec.    Ambulating Backwards  Walks 20 ft, uses assistive device, slower speed, mild gait deviations, deviates 6-10 in outside 12 in walkway width.    Steps  Alternating feet, must use rail.    Total Score  18    FGA comment:  18/30, improved from 13/30                    [facility] Adult PT Treatment/Exercise - 03/23/19 1025      Ambulation/Gait   Ambulation/Gait Assistance Details  The patient is now ambulating mod indep on level surfaces in the clinic.  Her gait is characterized  by increased L weight shift, exaggerated R heel strike + eversion, and abduction of the R foot from midline to create a wider base of support.      Ambulation Distance (Feet)  800 Feet    Assistive device  None    Ambulation Surface  Unlevel;Level;Indoor;Outdoor;Paved;Grass    Gait Comments  Gait outdoors on grassy surfaces, inclines, declines, curbs, pine straw with supervision.  The patient is able to compensate with a wider base of support and has intermittent loss of balance, but is able to self correct.  Gait activities  in clinic with tactile cues pulling anteriorly at R hip to encourage R hip over R toes.  This iimproves weight shift and reduced R foot abduction from midline.  Used patient's technology to show her the different by videoing before R hip initiation and after for visual cues and learning for carryover at home.      Neuro Re-ed    Neuro Re-ed Details   Rocker board standing in lateral plane with UE reaching, head motion, eyes open + eyes closed.  Patient requires CGA for safety.                 PT Short Term Goals - 03/24/19 1129      PT SHORT TERM GOAL #1   Title  patient to be independent with initial HEP    Baseline  Patient not performing at recommended frequency.  Is able to return demo components.    Time  4    Period  Weeks    Status  Achieved    Target Date  02/14/19      PT SHORT TERM GOAL #2   Title  patient to improve gait speed with no AD to >/= 2.62 ft/sec    Baseline  *Measured in clinic on 5/1 (due to covide- not able to measure before) :  2.72 ft/sec    Time  4    Period  Weeks    Status  Achieved    Target Date  02/14/19      PT SHORT TERM GOAL #3   Title  patient to improve Berg to >/= 48/56 demonstrating  improved balance    Baseline  49/56    Time  4    Period  Weeks    Status  Achieved    Target Date  02/14/19      PT SHORT TERM GOAL #4   Title  patient to improve FGA to >/= 16/30    Baseline  Measured on 5/1 and it is 18/30.    Time  4    Period  Weeks    Status  Achieved    Target Date  02/14/19      PT SHORT TERM GOAL #5   Title  patient to ambulate up/down 1 flight of steps with single to no handrail and reciprocal pattern    Baseline  Discussed by telehealth 4/20 visit:  Patient is using a reciprocal pattern with one handrail at this time.    Time  4    Period  Weeks    Status  Achieved    Target Date  02/14/19        PT Long Term Goals - 03/24/19 1130      PT LONG TERM GOAL #1   Title  patient to be independent with advanced HEP    Time  8    Period  Weeks    Status  Achieved  PT LONG TERM GOAL #2   Title  patient to improve Berg to >/= 52/56    Baseline  52/56.    Time  8    Period  Weeks    Status  Achieved      PT LONG TERM GOAL #3   Title  patient to improve FGA to >/= 20/30    Baseline  Improved from 13/30 up to 18/30.    Time  8    Period  Weeks    Status  Partially Met      PT LONG TERM GOAL #4   Title  patient to demonstrate gait over various surfaces and inclines with Mod I without LOB with LRAD for >/= 500 feet    Baseline  PT provided supervision for safety on unlevel, community surfaces.    Time  8    Period  Weeks    Status  Partially Met      PT LONG TERM GOAL #5   Title  patient to improve gait speed to >/= 3.5 ft/sec    Baseline  2.72 ft/sec on 03/23/19 (improved from 1.9 ft/sec at eval)    Time  8    Period  Weeks    Status  Partially Met      UPDATED GOALS:  PT Short Term Goals - 03/24/19 1136      PT SHORT TERM GOAL #1   Title  The patient will be indep with HEP for progression of balance, dynamic gait activities.  (STG due date is 04/22/19)    Time  4    Period  Weeks    Target Date  04/22/19      PT SHORT TERM GOAL  #2   Title  The patient will improve gait speed from 2.72 ft/sec to > or equal to 3.2 ft/sec to demo improving functional mobility.    Time  4    Period  Weeks    Target Date  04/22/19      PT SHORT TERM GOAL #3   Title  The patient will maintain single leg stance x 10 seconds to demo improved control of narrow base of support standing.     Time  4    Period  Weeks    Target Date  04/22/19      PT SHORT TERM GOAL #4   Title  The patient will improve FGA from 18/30 to > or equal to 21/30 to demonstrate improved dynamic gait.    Time  4    Period  Weeks    Target Date  04/22/19      PT Long Term Goals - 03/24/19 1140      PT LONG TERM GOAL #1   Title  The patient will verbalize understanding of post d/c HEP and wellness options for ongoing physical activity.  (LTG due date 05/22/19)    Time  8    Period  Weeks    Target Date  05/22/19      PT LONG TERM GOAL #2   Title  The patient will improve Berg balance score to > or equal to 55/56 to demo improved balance control.    Time  8    Period  Weeks    Target Date  05/22/19      PT LONG TERM GOAL #3   Title  The patient will improve FGA to > or equal to24/30 to demo dec'ing risk for falls.    Time  8    Period  Weeks    Target Date  05/22/19      PT LONG TERM GOAL #4   Title  patient to demonstrate gait over various surfaces and inclines mod indep x 2000 ft nonstop.    Time  8    Period  Weeks    Target Date  05/22/19      PT LONG TERM GOAL #5   Title  The patient will improve gait speed to > or equal to 3.5 ft/sec to demo improved functional mobility.    Time  8    Period  Weeks    Target Date  05/22/19      Additional Long Term Goals   Additional Long Term Goals  Yes      PT LONG TERM GOAL #6   Title  The patient will be able to ambulate indoors/outdoors while carrying her 36 year old to return to independence in her life roles.    Time  8    Period  Weeks    Target Date  05/22/19            Plan -  03/24/19 1131    Clinical Impression Statement  The patient met 2 LTGs and partially met 3 LTGs.  PT updated goals for continued therapy.  The patient has participated with telehealth x 3 visits in April before returning to clinic as our clinic worked through who could safely be treated.  Due to Aylee's low risk of covid complications, she was brought into the clinic.  We will only schedule 1x/week due to limited slots at our clinic for the month of May.     PT Frequency  1x / week    PT Duration  8 weeks    PT Treatment/Interventions  ADLs/Self Care Home Management;Cryotherapy;Electrical Stimulation;Functional mobility training;Stair training;Gait training;DME Instruction;Moist Heat;Therapeutic activities;Therapeutic exercise;Balance training;Neuromuscular re-education;Patient/family education;Orthotic Fit/Training;Passive range of motion;Manual techniques;Taping;Vestibular    PT Next Visit Plan  Monitor BP, posture, neck activities (improved from mid April).  The patient will benefit from R stance activities, R hip initiation during gait, increasing speed (treadmill) and community gait.      Consulted and Agree with Plan of Care  Patient       Patient will benefit from skilled therapeutic intervention in order to improve the following deficits and impairments:  Abnormal gait, Decreased balance, Decreased mobility, Decreased strength, Decreased coordination, Difficulty walking, Decreased safety awareness  Visit Diagnosis: Muscle weakness (generalized)  Unsteadiness on feet  Other abnormalities of gait and mobility     Problem List Patient Active Problem List   Diagnosis Date Noted  . Vertebral artery dissection ([place])   . Elevated blood pressure reading   . Major depressive disorder   . Panic disorder   . CVA (cerebral vascular accident) ([place]) 01/02/2019  . Acute ischemic stroke ([place]) 01/02/2019  . S/P cesarean section 05/05/2016  . S/P tubal ligation 05/05/2016  . Term pregnancy,  repeat 05/04/2016  . Unspecified vitamin D deficiency 12/18/2012  . ADHD (attention deficit hyperactivity disorder) 11/26/2011  . Dysthymia 11/26/2011    Arjun Hard, PT  03/24/2019, 11:34 AM  [facility] [address] [place], [place], [address] Phone: [phone]   Fax:  [phone]  Name: KAYLAH CHIASSON MRN: [id number] Date of Birth: January 28, 1981

## 2019-03-27 ENCOUNTER — Other Ambulatory Visit: Payer: Self-pay

## 2019-03-27 ENCOUNTER — Encounter: Payer: Self-pay | Admitting: Rehabilitative and Restorative Service Providers"

## 2019-03-27 ENCOUNTER — Ambulatory Visit: Payer: 59 | Admitting: Rehabilitative and Restorative Service Providers"

## 2019-03-27 ENCOUNTER — Ambulatory Visit: Payer: 59 | Admitting: Occupational Therapy

## 2019-03-27 ENCOUNTER — Encounter: Payer: Self-pay | Admitting: Occupational Therapy

## 2019-03-27 VITALS — BP 119/74 | HR 78

## 2019-03-27 DIAGNOSIS — M6281 Muscle weakness (generalized): Secondary | ICD-10-CM

## 2019-03-27 DIAGNOSIS — I7774 Dissection of vertebral artery: Secondary | ICD-10-CM | POA: Diagnosis not present

## 2019-03-27 DIAGNOSIS — R278 Other lack of coordination: Secondary | ICD-10-CM

## 2019-03-27 DIAGNOSIS — R2681 Unsteadiness on feet: Secondary | ICD-10-CM

## 2019-03-27 DIAGNOSIS — R2689 Other abnormalities of gait and mobility: Secondary | ICD-10-CM

## 2019-03-27 DIAGNOSIS — R41842 Visuospatial deficit: Secondary | ICD-10-CM

## 2019-03-27 NOTE — Therapy (Signed)
[facility] [address] [place], [place], [address] Phone: [phone]   Fax:  [phone]  Occupational Therapy Treatment  Patient Details  Name: [name] MRN: [id number] Date of Birth: September 24, 1981 Referring Provider (OT): Dr. Letta Pate   Encounter Date: 03/27/2019   CLINIC OPERATION CHANGES: [facility] is operating at a low capacity due to COVID-19.  The patient was brought into the clinic for evaluation and/or treatment following universal masking by staff, social distancing, and <10 people in the clinic.  The patient's COVID risk of complications score is 1.   OT End of Session - 03/27/19 0935    Visit Number  10    Number of Visits  17    Date for OT Re-Evaluation  03/18/19    Authorization Type  [employer] (23 visit limit for OT)    Authorization - Visit Number  10    Authorization - Number of Visits  23    OT Start Time  [phone]    OT Stop Time  1015    OT Time Calculation (min)  41 min    Activity Tolerance  Patient tolerated treatment well    Behavior During Therapy  WFL for tasks assessed/performed       Past Medical History:  Diagnosis Date  . ADD (attention deficit disorder)   . Exercise-induced asthma   . Hx of varicella   . Kidney stones   . S/P cesarean section 05/05/2016  . S/P tubal ligation 05/05/2016  . Stroke ([place])   . Term pregnancy, repeat 05/04/2016    Past Surgical History:  Procedure Laterality Date  . BACK SURGERY     scoliosis age 46  . BILATERAL SALPINGECTOMY Bilateral 05/05/2016   Procedure: BILATERAL SALPINGECTOMY;  Surgeon: Janyth Contes, MD;  Location: [facility];  Service: Obstetrics;  Laterality: Bilateral;  . CESAREAN SECTION    . CESAREAN SECTION N/A 05/05/2016   Procedure: CESAREAN SECTION;  Surgeon: Janyth Contes, MD;  Location: [facility];  Service: Obstetrics;  Laterality: N/A;  . CHOLECYSTECTOMY    . HAND SURGERY     . HERNIA REPAIR    . KIDNEY STONE SURGERY     R--surgery; L lithotripsy.  Dr. Gaynelle Arabian    There were no vitals filed for this visit.  Subjective Assessment - 03/27/19 0935    Subjective   I'm using my RUE more.      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 PMH: ADHD, panic attacks, ex induced asthma    Limitations  **fall risk, no driving, diplopia    Currently in Pain?  No/denies          In standing, functional reaching to retrieve objects from floor to place in overhead cabinet with RUE with mod cueing for weight shift to the R as pt did not shift weight at all initially.  Improved with cueing with noted improved RUE control for reach with improved wt. Shift.    Sitting, tossing ball between hands, with just RUE, and then with therapist with min-mod difficulty for improved visual-perceptual skills and RUE coordination.  Pt instructed to begin working on this again at home.  Reviewed visual HEP with focus on increasing range where pt can merge images (forward/back, and down).  Min-mod cueing to avoid head tilt.    Writing 3 sentences with cueing for decr pressure on pen, support forearm on table, and scoot closer to table.  Pt was able to write on lines today (improved).  Also practiced continuous cursive "l" for incr control, flow, and wrist extension for writing with min cueing.         OT Short Term Goals - 03/23/19 1045      OT SHORT TERM GOAL #1   Title  Independent w/ coordination HEP - 02/15/19    Time  4    Period  Weeks    Status  Achieved      OT SHORT TERM GOAL #2   Title  Independent with diplopia HEP     Time  4    Period  Weeks    Status  Achieved      OT SHORT TERM GOAL #3   Title  Improve fine motor coordination Rt hand as evidenced by performing 9 hole peg test in 42 sec or under    Baseline  49.41 sec    Time  6    Period  Weeks    Status  On-going   03/23/19 1 min 17 secs      OT SHORT TERM GOAL #4   Title  Pt to make snack from standing level w/ supervision    Time  4    Period  Weeks    Status  Achieved      OT SHORT TERM GOAL #5   Title  Pt to perform light household tasks (laundry, Manufacturing engineer) w/ counter support x 10 min. w/o rest or LOB    Time  4    Period  Weeks    Status  Achieved      OT SHORT TERM GOAL #6   Title  Pt to read 1 line of enlarged text w/ improvements in diplopia per pt report    Period  Weeks    Status  Achieved      OT SHORT TERM GOAL #7   Title  Pt will report using RUE to assist with ADLs/Functional activity at least 75% of the time.    Baseline  currently using RUE 50% of the time    Time  6    Period  Weeks    Status  New      OT SHORT TERM GOAL #8   Title  Pt will report diplopia with functional activities and environmental scanning 25% or less of the time.    Baseline  currently diplopia 40% of the time with functional activity    Time  6    Period  Weeks    Status  New      OT SHORT TERM GOAL  #9   TITLE  Pt will demonstrate ability to write a short paragraph in a reasonable maount of time with 100% legibility    Baseline  Writes a sentence with 100% legibility and increased time    Time  6    Period  Weeks    Status  New        OT Long Term Goals - 03/23/19 1055      OT LONG TERM GOAL #1   Title  Independent with updated HEP prn - 03/18/19    Time  8    Period  Weeks    Status  Achieved      OT LONG TERM GOAL #2   Title  Pt to return to light cooking tasks for 20 min. w/ distant supervision using RUE at least 90% of the time    Time  12    Period  Weeks    Status  On-going   03/23/19 , uses RUE 50% of time     OT LONG TERM GOAL #3   Title  Pt to return to moderately complex household tasks w/ distant supervision using RUE at least 90% of the time.    Time  12    Period  Weeks    Status  Revised   03/23/19 uses RUE 75% of the time     OT LONG TERM GOAL #4   Title  Pt to improve  grip strength Rt dominant hand by 10 lbs     Baseline  65 lbs    Time  12    Period  Weeks    Status  On-going   68, 76, 64- improved but not consistent     OT LONG TERM GOAL #5   Title  Pt to improve fine motor coordination Rt hand as evidenced by reducing speed on 9 hole peg test to 35 sec. or less    Baseline  49.41 sec    Time  12    Period  Weeks    Status  On-going   1 min 17 secs     Long Term Additional Goals   Additional Long Term Goals  Yes      OT LONG TERM GOAL #6   Title  Pt able to read paragraph of text w/ minimal diplopia    Time  8    Period  Weeks    Status  Achieved   Pt is able to position her head so that she does not have diplopia     OT LONG TERM GOAL #7   Title  Pt demo improved coordination to be able to pour liquid with RUE without spills.    Status  Achieved      OT LONG TERM GOAL #8   Title  Pt will demonstrate ability to ambulate while carrying a beverage in RUE without spilling.    Baseline  unable to carry a beverage in RUE without spills.    Time  12    Period  Weeks    Status  New      OT LONG TERM GOAL  #9   TITLE  Pt will demonstrate adequate RUE contol to retrieve a 5 lbs weight from mid level shelf with good control.    Time  12    Period  Weeks    Status  New            Plan - 03/27/19 0936    Clinical Impression Statement  Pt is progressing towards goals with improving RUE control cueing to wt. shift to the Right.  Pt continues to need cueing to avoid compensation patterns for vision and balance as well as forced RUE functional use.      Occupational Profile and client history currently impacting functional performance  Pt is wife and mother of 4 children. Current deficits affect ability to perform ADLS, activities, life roles. PMH: ADHD, panic attacks, exercise induced asthma    Occupational performance deficits (Please refer to evaluation for details):  ADL's;IADL's;Leisure;Other    Body Structure / Function / Physical  Skills  UE functional use;ROM;FMC;Mobility;Vision;Dexterity;Strength;Endurance;Coordination;IADL;Decreased knowledge of precautions;Decreased knowledge of use of DME    Rehab Potential  Good    OT Frequency  --   14 visits over 12 weeks, currently seeing 1x week   OT Duration  12 weeks    OT Treatment/Interventions  Self-care/ADL training;Therapeutic exercise;Visual/perceptual remediation/compensation;Coping strategies  training;Neuromuscular education;Patient/family education;Therapeutic activities;Functional Mobility Training;DME and/or AE instruction;Passive range of motion;Aquatic Therapy;Moist Heat;Fluidtherapy;Balance training;Gait Training;Cryotherapy;Energy conservation;Manual Therapy    Plan  address RUE strength/control, visual-perceptual skills, environmental scanning, avoiding postural compensation    OT Home Exercise Plan  Education provided:  pt has coordination HEP, putty,  and diplopia HEP.     Medbridge Access Code:  [email or web address]     Consulted and Agree with Plan of Care  Patient       Patient will benefit from skilled therapeutic intervention in order to improve the following deficits and impairments:  Body Structure / Function / Physical Skills  Visit Diagnosis: Muscle weakness (generalized)  Unsteadiness on feet  Visuospatial deficit  Other lack of coordination  Other abnormalities of gait and mobility    Problem List Patient Active Problem List   Diagnosis Date Noted  . Vertebral artery dissection ([facility])   . Elevated blood pressure reading   . Major depressive disorder   . Panic disorder   . CVA (cerebral vascular accident) ([place]) 01/02/2019  . Acute ischemic stroke ([facility]) 01/02/2019  . S/P cesarean section 05/05/2016  . S/P tubal ligation 05/05/2016  . Term pregnancy, repeat 05/04/2016  . Unspecified vitamin D deficiency 12/18/2012  . ADHD (attention deficit hyperactivity disorder) 11/26/2011  . Dysthymia 11/26/2011    [facility] 03/27/2019, 4:03  PM  [facility] [address] [place], [place], [address] Phone: [phone]   Fax:  [phone]  Name: KAMORI KITCHENS MRN: [id number] Date of Birth: May 26, 1981   Vianne Bulls, OTR/L [facility] [address] [place], [place]  [address] [phone] phone [phone] 03/27/19 4:03 PM

## 2019-03-28 NOTE — Therapy (Signed)
[facility] [address] [place], [place], [address] Phone: [phone]   Fax:  [phone]  Physical Therapy Treatment  Patient Details  Name: [name] MRN: [id number] Date of Birth: 04-06-1981 Referring Provider (PT): Dr. [name] Penna   Encounter Date: 03/27/2019  PT End of Session - 03/28/19 1601    Visit Number  10    Number of Visits  17    Date for PT Re-Evaluation  03/21/19    Authorization Type  [employer]  (23 visit limit)    Authorization - Visit Number  10    Authorization - Number of Visits  23    PT Start Time  6962    PT Stop Time  1100    PT Time Calculation (min)  45 min    Activity Tolerance  Patient tolerated treatment well    Behavior During Therapy  [facility] for tasks assessed/performed      CLINIC OPERATION CHANGES: [facility] is operating at a low capacity due to COVID-19.  The patient was brought into the clinic for evaluation and/or treatment following universal masking by staff, social distancing, and <10 people in the clinic.  The patient's COVID risk of complications score is 1.   Past Medical History:  Diagnosis Date  . ADD (attention deficit disorder)   . Exercise-induced asthma   . Hx of varicella   . Kidney stones   . S/P cesarean section 05/05/2016  . S/P tubal ligation 05/05/2016  . Stroke ([place])   . Term pregnancy, repeat 05/04/2016    Past Surgical History:  Procedure Laterality Date  . BACK SURGERY     scoliosis age 67  . BILATERAL SALPINGECTOMY Bilateral 05/05/2016   Procedure: BILATERAL SALPINGECTOMY;  Surgeon: Janyth Contes, MD;  Location: [facility];  Service: Obstetrics;  Laterality: Bilateral;  . CESAREAN SECTION    . CESAREAN SECTION N/A 05/05/2016   Procedure: CESAREAN SECTION;  Surgeon: Janyth Contes, MD;  Location: [facility];  Service: Obstetrics;  Laterality: N/A;  . CHOLECYSTECTOMY    . HAND SURGERY    .  HERNIA REPAIR    . KIDNEY STONE SURGERY     R--surgery; L lithotripsy.  Dr. Gaynelle Arabian    Vitals:   03/27/19 1026  BP: 119/74  Pulse: 78    Subjective Assessment - 03/27/19 1023    Subjective  The patient notes that she is doing well with home exercises.      Pertinent History  CVA, ADHD/ADD, panic attacks, exercise induced asthma    Patient Stated Goals  "Using my Right side" - feels as if it is weaker and discoordinated    Currently in Pain?  No/denies         [facility] PT Assessment - 03/27/19 1025      Ambulation/Gait   Ambulation/Gait Assistance Details  The patient continues with R LE abducted from midline.     Assistive device  None    Gait Comments  The patient ambulated on a treadmill working on more reciprocal, faster paced gait holding with bilat UEs with PT encouraging bringing R foot back to midline.  Performed at speed from 1.5 up to 2.4 mph and incline from 0% up to 4%.  Patient ambulated x 6 minutes.  Then ambulated on level, indoor surfaces with cues for bialteral UEs movement.                     [facility] Adult PT Treatment/Exercise -  03/27/19 1025      Ambulation/Gait   Ambulation/Gait  Yes    Ambulation/Gait Assistance  6: Modified independent (Device/Increase time)      Neuro Re-ed    Neuro Re-ed Details   Single leg stance activities working on R LE control,  Marching while moving anteriorly adding UE movements for arm swing and reciprocal pattern.  Dynamic balance/gait activities moving from anterior walking>posterior walking, turning to perform sidestepping. Turning in place with CGA to SBA assist.  Reaching towards the floor while walking to simulate dropping an object and picking it up.  Tall kneeling <>1/2 kneeling and working on overhead lifting 5 lbs while R LE is supported in 1/2 kneel on compliant surfaces.  Standing on foam on a ramp in all planes with SBA while working on balance with feet apart.               PT Short Term Goals -  03/24/19 1136      PT SHORT TERM GOAL #1   Title  The patient will be indep with HEP for progression of balance, dynamic gait activities.  (STG due date is 04/22/19)    Time  4    Period  Weeks    Target Date  04/22/19      PT SHORT TERM GOAL #2   Title  The patient will improve gait speed from 2.72 ft/sec to > or equal to 3.2 ft/sec to demo improving functional mobility.    Time  4    Period  Weeks    Target Date  04/22/19      PT SHORT TERM GOAL #3   Title  The patient will maintain single leg stance x 10 seconds to demo improved control of narrow base of support standing.     Time  4    Period  Weeks    Target Date  04/22/19      PT SHORT TERM GOAL #4   Title  The patient will improve FGA from 18/30 to > or equal to 21/30 to demonstrate improved dynamic gait.    Time  4    Period  Weeks    Target Date  04/22/19        PT Long Term Goals - 03/24/19 1140      PT LONG TERM GOAL #1   Title  The patient will verbalize understanding of post d/c HEP and wellness options for ongoing physical activity.  (LTG due date 05/22/19)    Time  8    Period  Weeks    Target Date  05/22/19      PT LONG TERM GOAL #2   Title  The patient will improve Berg balance score to > or equal to 55/56 to demo improved balance control.    Time  8    Period  Weeks    Target Date  05/22/19      PT LONG TERM GOAL #3   Title  The patient will improve FGA to > or equal to24/30 to demo dec'ing risk for falls.    Time  8    Period  Weeks    Target Date  05/22/19      PT LONG TERM GOAL #4   Title  patient to demonstrate gait over various surfaces and inclines mod indep x 2000 ft nonstop.    Time  8    Period  Weeks    Target Date  05/22/19      PT LONG TERM GOAL #  5   Title  The patient will improve gait speed to > or equal to 3.5 ft/sec to demo improved functional mobility.    Time  8    Period  Weeks    Target Date  05/22/19      Additional Long Term Goals   Additional Long Term Goals  Yes       PT LONG TERM GOAL #6   Title  The patient will be able to ambulate indoors/outdoors while carrying her 36 year old to return to independence in her life roles.    Time  8    Period  Weeks    Target Date  05/22/19            Plan - 03/28/19 1616    Clinical Impression Statement  The patient's R ankle has dec'd coordination noted during high level tasks.  Focused/emphasized placing R LE on compliant surfaces while challenging balance. PT continuing to work on dynamic tasks to progress to greater independence with daily tasks.    PT Treatment/Interventions  ADLs/Self Care Home Management;Cryotherapy;Electrical Stimulation;Functional mobility training;Stair training;Gait training;DME Instruction;Moist Heat;Therapeutic activities;Therapeutic exercise;Balance training;Neuromuscular re-education;Patient/family education;Orthotic Fit/Training;Passive range of motion;Manual techniques;Taping;Vestibular    PT Next Visit Plan  Monitor BP, posture, neck activities (improved from mid April).  The patient will benefit from R stance activities, R hip initiation during gait, increasing speed (treadmill) and community gait.      Consulted and Agree with Plan of Care  Patient       Patient will benefit from skilled therapeutic intervention in order to improve the following deficits and impairments:  Abnormal gait, Decreased balance, Decreased mobility, Decreased strength, Decreased coordination, Difficulty walking, Decreased safety awareness  Visit Diagnosis: Muscle weakness (generalized)  Unsteadiness on feet  Other abnormalities of gait and mobility     Problem List Patient Active Problem List   Diagnosis Date Noted  . Vertebral artery dissection ([place])   . Elevated blood pressure reading   . Major depressive disorder   . Panic disorder   . CVA (cerebral vascular accident) ([place]) 01/02/2019  . Acute ischemic stroke ([place]) 01/02/2019  . S/P cesarean section 05/05/2016  . S/P tubal ligation  05/05/2016  . Term pregnancy, repeat 05/04/2016  . Unspecified vitamin D deficiency 12/18/2012  . ADHD (attention deficit hyperactivity disorder) 11/26/2011  . Dysthymia 11/26/2011    Jovian Lembcke, PT 03/28/2019, 4:18 PM  [facility] [address] [place], [place], [address] Phone: [phone]   Fax:  [phone]  Name: [name] MRN: [id number] Date of Birth: 02-25-1981

## 2019-03-29 DIAGNOSIS — H538 Other visual disturbances: Secondary | ICD-10-CM | POA: Diagnosis not present

## 2019-03-29 DIAGNOSIS — I7774 Dissection of vertebral artery: Secondary | ICD-10-CM | POA: Diagnosis not present

## 2019-03-29 DIAGNOSIS — H532 Diplopia: Secondary | ICD-10-CM | POA: Diagnosis not present

## 2019-03-29 DIAGNOSIS — H518 Other specified disorders of binocular movement: Secondary | ICD-10-CM | POA: Diagnosis not present

## 2019-04-03 ENCOUNTER — Ambulatory Visit: Payer: 59 | Admitting: Rehabilitative and Restorative Service Providers"

## 2019-04-03 ENCOUNTER — Encounter: Payer: Self-pay | Admitting: Rehabilitative and Restorative Service Providers"

## 2019-04-03 ENCOUNTER — Encounter: Payer: Self-pay | Admitting: Occupational Therapy

## 2019-04-03 ENCOUNTER — Ambulatory Visit: Payer: 59 | Admitting: Occupational Therapy

## 2019-04-03 ENCOUNTER — Other Ambulatory Visit: Payer: Self-pay

## 2019-04-03 DIAGNOSIS — R2689 Other abnormalities of gait and mobility: Secondary | ICD-10-CM

## 2019-04-03 DIAGNOSIS — I7774 Dissection of vertebral artery: Secondary | ICD-10-CM | POA: Diagnosis not present

## 2019-04-03 DIAGNOSIS — R278 Other lack of coordination: Secondary | ICD-10-CM

## 2019-04-03 DIAGNOSIS — R2681 Unsteadiness on feet: Secondary | ICD-10-CM

## 2019-04-03 DIAGNOSIS — R41842 Visuospatial deficit: Secondary | ICD-10-CM

## 2019-04-03 DIAGNOSIS — M6281 Muscle weakness (generalized): Secondary | ICD-10-CM

## 2019-04-03 NOTE — Therapy (Signed)
[name] [address] [place], [place], [address] Phone: [phone]   Fax:  [phone]  Physical Therapy Treatment  Patient Details  Name: [name] MRN: [id number] Date of Birth: Apr 21, 1981 Referring Provider (PT): Dr. [name] Penna   Encounter Date: 04/03/2019  PT End of Session - 04/03/19 1341    Visit Number  11    Number of Visits  17    Date for PT Re-Evaluation  05/22/19    Authorization Type  [employer]  (23 visit limit)    Authorization - Visit Number  11    Authorization - Number of Visits  23    PT Start Time  [id number]    PT Stop Time  1017    PT Time Calculation (min)  40 min    Activity Tolerance  Patient tolerated treatment well    Behavior During Therapy  [facility] for tasks assessed/performed       Past Medical History:  Diagnosis Date  . ADD (attention deficit disorder)   . Exercise-induced asthma   . Hx of varicella   . Kidney stones   . S/P cesarean section 05/05/2016  . S/P tubal ligation 05/05/2016  . Stroke ([place])   . Term pregnancy, repeat 05/04/2016    Past Surgical History:  Procedure Laterality Date  . BACK SURGERY     scoliosis age 69  . BILATERAL SALPINGECTOMY Bilateral 05/05/2016   Procedure: BILATERAL SALPINGECTOMY;  Surgeon: Janyth Contes, MD;  Location: [facility];  Service: Obstetrics;  Laterality: Bilateral;  . CESAREAN SECTION    . CESAREAN SECTION N/A 05/05/2016   Procedure: CESAREAN SECTION;  Surgeon: Janyth Contes, MD;  Location: [facility];  Service: Obstetrics;  Laterality: N/A;  . CHOLECYSTECTOMY    . HAND SURGERY    . HERNIA REPAIR    . KIDNEY STONE SURGERY     R--surgery; L lithotripsy.  Dr. Gaynelle Arabian    There were no vitals filed for this visit.  Subjective Assessment - 04/03/19 0939    Subjective  The patient is still inquiring about driving.    She is walking for exercise.    Pertinent History  CVA, ADHD/ADD, panic attacks,  exercise induced asthma    Patient Stated Goals  "Using my Right side" - feels as if it is weaker and discoordinated    Currently in Pain?  No/denies                       [facility] Adult PT Treatment/Exercise - 04/03/19 0939      Ambulation/Gait   Ambulation/Gait  Yes    Ambulation/Gait Assistance  6: Modified independent (Device/Increase time)    Ambulation/Gait Assistance Details  Patient ambulated with a resistance belt with PT providing dynamic pulling/releasing of force to have patient ambulate with external perturbations.    Ambulation Distance (Feet)  300 Feet    Assistive device  None    Gait Comments  PT emphasized R hip initiation leading R hip over R toes during gait activities.  Ball toss during gait for multi-tasking.  Walking forwards/backwards with CGA, marching, toe walking and toe walking with mini squat walking x 50 feet.        Neuro Re-ed    Neuro Re-ed Details   180 degree turns while stepping up onto a 4" block beginning wiht wider base of support and working towards more narrow base of support.  180 degree turns on ramp/incline with CGA for safety R  and L sides using wider base for compensatory strategies.  TALL to 1/2 kneeling working on weight shift and proximal stability.  Placing R foot on compliant disc while in 1/2 kneeling performing overhead lifting with 5 lb weights x 5 reps R and L UEs and then reaching forward alternating R and L x 5 reps iwth 5 lbs.  Placed L foot on compliant disc in 1/2 kneeling and patient needed CGA for safety initially.  Performed UE reaching while working on proximal stabilization.   Quadriped alternating UE/LE extension x 5 reps each side.      Exercises   Exercises  Other Exercises    Other Exercises   Extended plank to down dog position x 5 reps.                PT Short Term Goals - 03/24/19 1136      PT SHORT TERM GOAL #1   Title  The patient will be indep with HEP for progression of balance, dynamic gait  activities.  (STG due date is 04/22/19)    Time  4    Period  Weeks    Target Date  04/22/19      PT SHORT TERM GOAL #2   Title  The patient will improve gait speed from 2.72 ft/sec to > or equal to 3.2 ft/sec to demo improving functional mobility.    Time  4    Period  Weeks    Target Date  04/22/19      PT SHORT TERM GOAL #3   Title  The patient will maintain single leg stance x 10 seconds to demo improved control of narrow base of support standing.     Time  4    Period  Weeks    Target Date  04/22/19      PT SHORT TERM GOAL #4   Title  The patient will improve FGA from 18/30 to > or equal to 21/30 to demonstrate improved dynamic gait.    Time  4    Period  Weeks    Target Date  04/22/19        PT Long Term Goals - 03/24/19 1140      PT LONG TERM GOAL #1   Title  The patient will verbalize understanding of post d/c HEP and wellness options for ongoing physical activity.  (LTG due date 05/22/19)    Time  8    Period  Weeks    Target Date  05/22/19      PT LONG TERM GOAL #2   Title  The patient will improve Berg balance score to > or equal to 55/56 to demo improved balance control.    Time  8    Period  Weeks    Target Date  05/22/19      PT LONG TERM GOAL #3   Title  The patient will improve FGA to > or equal to24/30 to demo dec'ing risk for falls.    Time  8    Period  Weeks    Target Date  05/22/19      PT LONG TERM GOAL #4   Title  patient to demonstrate gait over various surfaces and inclines mod indep x 2000 ft nonstop.    Time  8    Period  Weeks    Target Date  05/22/19      PT LONG TERM GOAL #5   Title  The patient will improve gait speed to > or equal  to 3.5 ft/sec to demo improved functional mobility.    Time  8    Period  Weeks    Target Date  05/22/19      Additional Long Term Goals   Additional Long Term Goals  Yes      PT LONG TERM GOAL #6   Title  The patient will be able to ambulate indoors/outdoors while carrying her 36 year old to return  to independence in her life roles.    Time  8    Period  Weeks    Target Date  05/22/19            Plan - 04/03/19 1348    Clinical Impression Statement  The patient is continuing to increase gait speed and comfort.  PT focusing on R ankle control, overall balance control and dynamic gait.  Continue working to [employer].     PT Treatment/Interventions  ADLs/Self Care Home Management;Cryotherapy;Electrical Stimulation;Functional mobility training;Stair training;Gait training;DME Instruction;Moist Heat;Therapeutic activities;Therapeutic exercise;Balance training;Neuromuscular re-education;Patient/family education;Orthotic Fit/Training;Passive range of motion;Manual techniques;Taping;Vestibular    PT Next Visit Plan  Monitor BP, posture, neck activities (improved from mid April).  The patient will benefit from R stance activities, R hip initiation during gait, increasing speed (treadmill) and community gait.      Consulted and Agree with Plan of Care  Patient       Patient will benefit from skilled therapeutic intervention in order to improve the following deficits and impairments:  Abnormal gait, Decreased balance, Decreased mobility, Decreased strength, Decreased coordination, Difficulty walking, Decreased safety awareness  Visit Diagnosis: Muscle weakness (generalized)  Unsteadiness on feet  Other abnormalities of gait and mobility     Problem List Patient Active Problem List   Diagnosis Date Noted  . Vertebral artery dissection ([place])   . Elevated blood pressure reading   . Major depressive disorder   . Panic disorder   . CVA (cerebral vascular accident) ([place]) 01/02/2019  . Acute ischemic stroke ([place]) 01/02/2019  . S/P cesarean section 05/05/2016  . S/P tubal ligation 05/05/2016  . Term pregnancy, repeat 05/04/2016  . Unspecified vitamin D deficiency 12/18/2012  . ADHD (attention deficit hyperactivity disorder) 11/26/2011  . Dysthymia 11/26/2011    Gerhart Ruggieri,  PT 04/03/2019, 2:05 PM  [facility] [address] [place], [place], [address] Phone: [phone]   Fax:  [phone]  Name: [name] MRN: [id number] Date of Birth: 17-Aug-1981

## 2019-04-03 NOTE — Therapy (Signed)
[facility] [address] [place], [place], [address] Phone: [phone]   Fax:  [phone]  Occupational Therapy Treatment  Patient Details  Name: [name] MRN: [id number] Date of Birth: Aug 24, 1981 Referring Provider (OT): Dr. Letta Pate   Encounter Date: 04/03/2019  OT End of Session - 04/03/19 1033    Visit Number  11    Number of Visits  17    Authorization Type  [employer] (23 visit limit for OT)    Authorization - Visit Number  11    Authorization - Number of Visits  23    OT Start Time  1018    OT Stop Time  1100    OT Time Calculation (min)  42 min    Activity Tolerance  Patient tolerated treatment well    Behavior During Therapy  WFL for tasks assessed/performed       Past Medical History:  Diagnosis Date  . ADD (attention deficit disorder)   . Exercise-induced asthma   . Hx of varicella   . Kidney stones   . S/P cesarean section 05/05/2016  . S/P tubal ligation 05/05/2016  . Stroke ([place])   . Term pregnancy, repeat 05/04/2016    Past Surgical History:  Procedure Laterality Date  . BACK SURGERY     scoliosis age 70  . BILATERAL SALPINGECTOMY Bilateral 05/05/2016   Procedure: BILATERAL SALPINGECTOMY;  Surgeon: Janyth Contes, MD;  Location: [facility];  Service: Obstetrics;  Laterality: Bilateral;  . CESAREAN SECTION    . CESAREAN SECTION N/A 05/05/2016   Procedure: CESAREAN SECTION;  Surgeon: Janyth Contes, MD;  Location: [facility];  Service: Obstetrics;  Laterality: N/A;  . CHOLECYSTECTOMY    . HAND SURGERY    . HERNIA REPAIR    . KIDNEY STONE SURGERY     R--surgery; L lithotripsy.  Dr. Gaynelle Arabian    There were no vitals filed for this visit.  Subjective Assessment - 04/03/19 1019    Subjective   Pt reports that she went to neuro ophthalmologist again and reports that vision continues to improve.  Will see again in 6 weeks to reassess.      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 PMH: ADHD, panic attacks, ex induced asthma    Limitations  **fall risk, no driving, diplopia    Currently in Pain?  No/denies       CLINIC OPERATION CHANGES: [facility] is operating at a low capacity due to COVID-19.  The patient was brought into the clinic for evaluation and/or treatment following universal masking by staff, patient masking, social distancing, and <10 people in the clinic.  The patient's COVID risk of complications score is 1.    In standing, functional reaching to retrieve objects from floor to place in overhead cabinet with RUE with mod cueing for weight shift to the R.  Improved with cueing with noted improved RUE control for reach with improved wt. Shift.  Min cueing given for R shoulder hike  Visual tracking inferiorly and divergence with focus on increasing range where pt can merge images (back and down).  Min cueing to avoid head tilt.     Discussed progress and prognosis per pt questions.  In standing, L foot on box to incr wt. Shift to the right while performing functional reaching RUE to place small pegs in vertical pegboard with mod difficulty/drops for coordination and mod cueing for R  shoulder hike with reach, improved with cueing and repetition.  Copied small peg design with only 1-2 cues for head tilt/closing R eye.  Pt reports no diplopia.    Environmental scanning to locate 12/15 items in min distracting environment with only 1-2 cues needed for compensation postures; however, pt did note diplopia.  Pt found remaining items upon 2nd pass.       OT Short Term Goals - 03/23/19 1045      OT SHORT TERM GOAL #1   Title  Independent w/ coordination HEP - 02/15/19    Time  4    Period  Weeks    Status  Achieved      OT SHORT TERM GOAL #2   Title  Independent with diplopia HEP     Time  4    Period  Weeks    Status  Achieved      OT  SHORT TERM GOAL #3   Title  Improve fine motor coordination Rt hand as evidenced by performing 9 hole peg test in 42 sec or under    Baseline  49.41 sec    Time  6    Period  Weeks    Status  On-going   03/23/19 1 min 17 secs     OT SHORT TERM GOAL #4   Title  Pt to make snack from standing level w/ supervision    Time  4    Period  Weeks    Status  Achieved      OT SHORT TERM GOAL #5   Title  Pt to perform light household tasks (laundry, Manufacturing engineer) w/ counter support x 10 min. w/o rest or LOB    Time  4    Period  Weeks    Status  Achieved      OT SHORT TERM GOAL #6   Title  Pt to read 1 line of enlarged text w/ improvements in diplopia per pt report    Period  Weeks    Status  Achieved      OT SHORT TERM GOAL #7   Title  Pt will report using RUE to assist with ADLs/Functional activity at least 75% of the time.    Baseline  currently using RUE 50% of the time    Time  6    Period  Weeks    Status  New      OT SHORT TERM GOAL #8   Title  Pt will report diplopia with functional activities and environmental scanning 25% or less of the time.    Baseline  currently diplopia 40% of the time with functional activity    Time  6    Period  Weeks    Status  New      OT SHORT TERM GOAL  #9   TITLE  Pt will demonstrate ability to write a short paragraph in a reasonable maount of time with 100% legibility    Baseline  Writes a sentence with 100% legibility and increased time    Time  6    Period  Weeks    Status  New        OT Long Term Goals - 03/23/19 1055      OT LONG TERM GOAL #1   Title  Independent with updated HEP prn - 03/18/19    Time  8    Period  Weeks    Status  Achieved      OT LONG TERM GOAL #2  Title  Pt to return to light cooking tasks for 20 min. w/ distant supervision using RUE at least 90% of the time    Time  12    Period  Weeks    Status  On-going   03/23/19 , uses RUE 50% of time     OT LONG TERM GOAL #3   Title  Pt to return  to moderately complex household tasks w/ distant supervision using RUE at least 90% of the time.    Time  12    Period  Weeks    Status  Revised   03/23/19 uses RUE 75% of the time     OT LONG TERM GOAL #4   Title  Pt to improve grip strength Rt dominant hand by 10 lbs     Baseline  65 lbs    Time  12    Period  Weeks    Status  On-going   68, 76, 64- improved but not consistent     OT LONG TERM GOAL #5   Title  Pt to improve fine motor coordination Rt hand as evidenced by reducing speed on 9 hole peg test to 35 sec. or less    Baseline  49.41 sec    Time  12    Period  Weeks    Status  On-going   1 min 17 secs     Long Term Additional Goals   Additional Long Term Goals  Yes      OT LONG TERM GOAL #6   Title  Pt able to read paragraph of text w/ minimal diplopia    Time  8    Period  Weeks    Status  Achieved   Pt is able to position her head so that she does not have diplopia     OT LONG TERM GOAL #7   Title  Pt demo improved coordination to be able to pour liquid with RUE without spills.    Status  Achieved      OT LONG TERM GOAL #8   Title  Pt will demonstrate ability to ambulate while carrying a beverage in RUE without spilling.    Baseline  unable to carry a beverage in RUE without spills.    Time  12    Period  Weeks    Status  New      OT LONG TERM GOAL  #9   TITLE  Pt will demonstrate adequate RUE contol to retrieve a 5 lbs weight from mid level shelf with good control.    Time  12    Period  Weeks    Status  New            Plan - 04/03/19 1036    Clinical Impression Statement  Pt is slowly progressing with ability to merge images in various locations without head compensations, but tracking inferiorly continues to be difficult.  Pt also progressing with improved ability to reach with incr RUE control and decr compensation, but needed consistent mod cueing today for shoulder hike and incr wt. shift to the right.    Occupational Profile and client  history currently impacting functional performance  Pt is wife and mother of 4 children. Current deficits affect ability to perform ADLS, activities, life roles. PMH: ADHD, panic attacks, exercise induced asthma    Occupational performance deficits (Please refer to evaluation for details):  ADL's;IADL's;Leisure;Other    Body Structure / Function / Physical Skills  UE functional use;ROM;FMC;Mobility;Vision;Dexterity;Strength;Endurance;Coordination;IADL;Decreased knowledge of  precautions;Decreased knowledge of use of DME    Rehab Potential  Good    OT Frequency  --   14 visits over 12 weeks, currently seeing 1x week   OT Duration  12 weeks    OT Treatment/Interventions  Self-care/ADL training;Therapeutic exercise;Visual/perceptual remediation/compensation;Coping strategies training;Neuromuscular education;Patient/family education;Therapeutic activities;Functional Mobility Training;DME and/or AE instruction;Passive range of motion;Aquatic Therapy;Moist Heat;Fluidtherapy;Balance training;Gait Training;Cryotherapy;Energy conservation;Manual Therapy    Plan  address RUE strength/control, visual-perceptual skills, ?theraband HEP (low range), avoiding postural compensation    OT Home Exercise Plan  Education provided:  pt has coordination HEP, putty,  and diplopia HEP.     Medbridge Access Code:  [email or web address]     Consulted and Agree with Plan of Care  Patient       Patient will benefit from skilled therapeutic intervention in order to improve the following deficits and impairments:  Body Structure / Function / Physical Skills  Visit Diagnosis: Muscle weakness (generalized)  Unsteadiness on feet  Visuospatial deficit  Other lack of coordination  Other abnormalities of gait and mobility    Problem List Patient Active Problem List   Diagnosis Date Noted  . Vertebral artery dissection ([place])   . Elevated blood pressure reading   . Major depressive disorder   . Panic disorder   . CVA (cerebral  vascular accident) ([place]) 01/02/2019  . Acute ischemic stroke ([place]) 01/02/2019  . S/P cesarean section 05/05/2016  . S/P tubal ligation 05/05/2016  . Term pregnancy, repeat 05/04/2016  . Unspecified vitamin D deficiency 12/18/2012  . ADHD (attention deficit hyperactivity disorder) 11/26/2011  . Dysthymia 11/26/2011    [facility] 04/03/2019, 4:23 PM  [facility] [address] [place], [place], [address] Phone: [phone]   Fax:  [phone]  Name: [name] MRN: [id number] Date of Birth: July 14, 1981   Vianne Bulls, OTR/L [facility] [address] [place], [place]  [address] [phone] phone [phone] 04/03/19 4:23 PM

## 2019-04-10 ENCOUNTER — Ambulatory Visit: Payer: 59 | Admitting: Occupational Therapy

## 2019-04-10 ENCOUNTER — Other Ambulatory Visit: Payer: Self-pay

## 2019-04-10 ENCOUNTER — Encounter: Payer: Self-pay | Admitting: Physical Medicine & Rehabilitation

## 2019-04-10 ENCOUNTER — Encounter: Payer: Self-pay | Admitting: Occupational Therapy

## 2019-04-10 ENCOUNTER — Encounter: Payer: Self-pay | Admitting: Rehabilitative and Restorative Service Providers"

## 2019-04-10 ENCOUNTER — Encounter: Payer: 59 | Attending: Registered Nurse | Admitting: Physical Medicine & Rehabilitation

## 2019-04-10 ENCOUNTER — Ambulatory Visit: Payer: 59 | Admitting: Rehabilitative and Restorative Service Providers"

## 2019-04-10 VITALS — BP 124/80 | HR 81 | Temp 98.3°F | Ht 64.0 in | Wt 172.0 lb

## 2019-04-10 DIAGNOSIS — R2681 Unsteadiness on feet: Secondary | ICD-10-CM

## 2019-04-10 DIAGNOSIS — R269 Unspecified abnormalities of gait and mobility: Secondary | ICD-10-CM | POA: Diagnosis not present

## 2019-04-10 DIAGNOSIS — R278 Other lack of coordination: Secondary | ICD-10-CM

## 2019-04-10 DIAGNOSIS — F410 Panic disorder [episodic paroxysmal anxiety] without agoraphobia: Secondary | ICD-10-CM | POA: Insufficient documentation

## 2019-04-10 DIAGNOSIS — I7774 Dissection of vertebral artery: Secondary | ICD-10-CM | POA: Diagnosis not present

## 2019-04-10 DIAGNOSIS — H539 Unspecified visual disturbance: Secondary | ICD-10-CM

## 2019-04-10 DIAGNOSIS — M6281 Muscle weakness (generalized): Secondary | ICD-10-CM

## 2019-04-10 DIAGNOSIS — I69393 Ataxia following cerebral infarction: Secondary | ICD-10-CM

## 2019-04-10 DIAGNOSIS — I69398 Other sequelae of cerebral infarction: Secondary | ICD-10-CM | POA: Diagnosis not present

## 2019-04-10 DIAGNOSIS — R41842 Visuospatial deficit: Secondary | ICD-10-CM

## 2019-04-10 DIAGNOSIS — R2689 Other abnormalities of gait and mobility: Secondary | ICD-10-CM

## 2019-04-10 DIAGNOSIS — I10 Essential (primary) hypertension: Secondary | ICD-10-CM | POA: Insufficient documentation

## 2019-04-10 DIAGNOSIS — H9312 Tinnitus, left ear: Secondary | ICD-10-CM | POA: Insufficient documentation

## 2019-04-10 NOTE — Patient Instructions (Signed)
Ask Dr Frederico Hamman when you can start driving.     Graduated return to driving instructions were provided. It is recommended that the patient first drives with another licensed driver in an empty parking lot. If the patient does well with this, and they can drive on a quiet street with the licensed driver. If the patient does well with this they can drive on a busy street with a licensed driver. If the patient does well with this, the next time out they can go by himself. For the first month after resuming driving, I recommend no nighttime or Interstate driving.

## 2019-04-10 NOTE — Therapy (Signed)
[facility] [address] [place], [place], [address] Phone: [phone]   Fax:  [phone]  Physical Therapy Treatment  Patient Details  Name: [name] MRN: [id number] Date of Birth: 03/04/1981 Referring Provider (PT): Dr. [name] Penna   Encounter Date: 04/10/2019  PT End of Session - 04/10/19 0933    Visit Number  12    Number of Visits  17    Date for PT Re-Evaluation  05/22/19    Authorization Type  [employer]  (23 visit limit)    Authorization - Visit Number  12    Authorization - Number of Visits  23    PT Start Time  0933    PT Stop Time  1015    PT Time Calculation (min)  42 min    Activity Tolerance  Patient tolerated treatment well    Behavior During Therapy  [facility] for tasks assessed/performed       Past Medical History:  Diagnosis Date  . ADD (attention deficit disorder)   . Exercise-induced asthma   . Hx of varicella   . Kidney stones   . S/P cesarean section 05/05/2016  . S/P tubal ligation 05/05/2016  . Stroke ([place])   . Term pregnancy, repeat 05/04/2016    Past Surgical History:  Procedure Laterality Date  . BACK SURGERY     scoliosis age 17  . BILATERAL SALPINGECTOMY Bilateral 05/05/2016   Procedure: BILATERAL SALPINGECTOMY;  Surgeon: Janyth Contes, MD;  Location: [facility];  Service: Obstetrics;  Laterality: Bilateral;  . CESAREAN SECTION    . CESAREAN SECTION N/A 05/05/2016   Procedure: CESAREAN SECTION;  Surgeon: Janyth Contes, MD;  Location: [facility];  Service: Obstetrics;  Laterality: N/A;  . CHOLECYSTECTOMY    . HAND SURGERY    . HERNIA REPAIR    . KIDNEY STONE SURGERY     R--surgery; L lithotripsy.  Dr. Gaynelle Arabian    There were no vitals filed for this visit.  Subjective Assessment - 04/10/19 0934    Subjective  The patient reports things are going well at home.    "Roads are not level."  She notes she has some instabiity outdoors due to  compliant surfaces and changes.    She has not felt neck discomfort since beginning stretches.    Pertinent History  CVA, ADHD/ADD, panic attacks, exercise induced asthma    Patient Stated Goals  "Using my Right side" - feels as if it is weaker and discoordinated    Currently in Pain?  No/denies       CLINIC OPERATION CHANGES: [facility] is operating at a low capacity due to COVID-19.  The patient was brought into the clinic for evaluation and/or treatment following universal masking by staff, social distancing, and <10 people in the clinic.  The patient's COVID risk of complications score is 1.       [facility] Adult PT Treatment/Exercise - 04/10/19 0953      Ambulation/Gait   Ambulation/Gait  Yes    Ambulation/Gait Assistance  6: Modified independent (Device/Increase time)    Ambulation/Gait Assistance Details  Patient ambulated without a device forweard/backwards with supervision, squat walking with direction changes, turns, toe walking.  The patient walked on unlevel surfaces with a mat + obstacles underneath the mat with SBA for safety.    Ambulation Distance (Feet)  400 Feet    Assistive device  None    Gait velocity  2.95 ft/sec    Gait Comments  PT emphasized R hip initiation and slowed gait to reduce exaggerated knee flexion + ankle eversion.        Therapeutic Activites    Therapeutic Activities  Other Therapeutic Activities    Other Therapeutic Activities  Standing heel raises in place without UE support.      Neuro Re-ed    Neuro Re-ed Details   Standing on rocker board with lateral tilting with CGA and intermittent UE support.   Tall kneeling to 1/2 kneeling working on proactive balance and ankle control placing compliant surface under anterior foot in 1/2 kneel.  Plank position to down dog with isometric holds for core engagement.  Turns on a ramp working on alternating foot taps to compliant disc with CGA for safety.                 PT  Short Term Goals - 03/24/19 1136      PT SHORT TERM GOAL #1   Title  The patient will be indep with HEP for progression of balance, dynamic gait activities.  (STG due date is 04/22/19)    Time  4    Period  Weeks    Target Date  04/22/19      PT SHORT TERM GOAL #2   Title  The patient will improve gait speed from 2.72 ft/sec to > or equal to 3.2 ft/sec to demo improving functional mobility.    Time  4    Period  Weeks    Target Date  04/22/19      PT SHORT TERM GOAL #3   Title  The patient will maintain single leg stance x 10 seconds to demo improved control of narrow base of support standing.     Time  4    Period  Weeks    Target Date  04/22/19      PT SHORT TERM GOAL #4   Title  The patient will improve FGA from 18/30 to > or equal to 21/30 to demonstrate improved dynamic gait.    Time  4    Period  Weeks    Target Date  04/22/19        PT Long Term Goals - 03/24/19 1140      PT LONG TERM GOAL #1   Title  The patient will verbalize understanding of post d/c HEP and wellness options for ongoing physical activity.  (LTG due date 05/22/19)    Time  8    Period  Weeks    Target Date  05/22/19      PT LONG TERM GOAL #2   Title  The patient will improve Berg balance score to > or equal to 55/56 to demo improved balance control.    Time  8    Period  Weeks    Target Date  05/22/19      PT LONG TERM GOAL #3   Title  The patient will improve FGA to > or equal to24/30 to demo dec'ing risk for falls.    Time  8    Period  Weeks    Target Date  05/22/19      PT LONG TERM GOAL #4   Title  patient to demonstrate gait over various surfaces and inclines mod indep x 2000 ft nonstop.    Time  8    Period  Weeks    Target Date  05/22/19      PT LONG TERM GOAL #5   Title  The patient will improve gait  speed to > or equal to 3.5 ft/sec to demo improved functional mobility.    Time  8    Period  Weeks    Target Date  05/22/19      Additional Long Term Goals   Additional Long  Term Goals  Yes      PT LONG TERM GOAL #6   Title  The patient will be able to ambulate indoors/outdoors while carrying her 36 year old to return to independence in her life roles.    Time  8    Period  Weeks    Target Date  05/22/19            Plan - 04/10/19 1330    Clinical Impression Statement  The patient continues to demonstrate improvement in gait speed and stability.  PT is continuing to progress dynamic gait activities and high level balance + work on motor control/coordination of distal R LE.     PT Treatment/Interventions  ADLs/Self Care Home Management;Cryotherapy;Electrical Stimulation;Functional mobility training;Stair training;Gait training;DME Instruction;Moist Heat;Therapeutic activities;Therapeutic exercise;Balance training;Neuromuscular re-education;Patient/family education;Orthotic Fit/Training;Passive range of motion;Manual techniques;Taping;Vestibular    PT Next Visit Plan  R stance activities, R hip initiation during gait, gait speed, dyanmic gait activities, commnity gait.    Consulted and Agree with Plan of Care  Patient       Patient will benefit from skilled therapeutic intervention in order to improve the following deficits and impairments:  Abnormal gait, Decreased balance, Decreased mobility, Decreased strength, Decreased coordination, Difficulty walking, Decreased safety awareness  Visit Diagnosis: Muscle weakness (generalized)  Unsteadiness on feet  Other abnormalities of gait and mobility     Problem List Patient Active Problem List   Diagnosis Date Noted  . Vertebral artery dissection ([place])   . Elevated blood pressure reading   . Major depressive disorder   . Panic disorder   . CVA (cerebral vascular accident) ([place]) 01/02/2019  . Acute ischemic stroke ([place]) 01/02/2019  . S/P cesarean section 05/05/2016  . S/P tubal ligation 05/05/2016  . Term pregnancy, repeat 05/04/2016  . Unspecified vitamin D deficiency 12/18/2012  . ADHD (attention  deficit hyperactivity disorder) 11/26/2011  . Dysthymia 11/26/2011    Byard Carranza, PT 04/10/2019, 1:32 PM  [facility] [address] [place], [place], [address] Phone: [phone]   Fax:  [phone]  Name: [name] MRN: [id number] Date of Birth: 09/17/1981

## 2019-04-10 NOTE — Patient Instructions (Signed)
Strengthening: Resisted Flexion   Attach tube to door.  Hold tubing with one arm at side. Pull forward and up with elbow straight. Move shoulder through pain-free range of motion, no further than shoulder height. Repeat 10-15 times per set.  Do 1-2 sessions per day.    Strengthening: Resisted Extension   Attach one end to door.  Hold tubing in one hand, arm forward. Pull arm back, elbow straight. Repeat 10-15 times per set. Do 1-2 sessions per day.   Resisted Horizontal Abduction: Bilateral   Sit or stand, tubing in both hands, palms down and arms out in front. Keeping arms straight, pinch shoulder blades together and stretch arms out.  HOLD AT BELLY BUTTON LEVEL.  Repeat 10-15 times per set.  Do 1-2 sessions per day.   Resistive Band Rowing    With resistive band anchored in door, grasp both ends. Keeping elbows bent, pull back, squeezing shoulder blades together. Hold 3 seconds. Repeat 10-15 times. Do 1-2 sessions per day.

## 2019-04-10 NOTE — Progress Notes (Signed)
Subjective:    Patient ID: [name], female    DOB: 1981-06-19, 38 y.o.   MRN: [id number]  HPI  38 year old female with onset of right-sided neck pain headache and dizziness which progressed over several days.  Was admitted to the hospital on 01/02/2019 after CT demonstrated right cerebellar bilateral PCA infarcts.  Further work-up revealed acute right vertebral artery dissection.  She does not recall any neck trauma which occurred anytime within a couple weeks of the hospital admission. The patient underwent inpatient rehabilitation from 01/06/2019 until 01/16/2019 and was discharged ambulating with a rolling walker.  Double  Vision getting better, has been evaluated by a neuro-ophthalmologist Dr. Frederico Hamman  Right LE fatigues with time.  Major concern is right upper extremity with decreased penmanship  FIne motor , writing, 9 hole peg still slow and inexact  No falls  Bathing and dressing, Mod I Cooking and cleaning except not carrying hot objects with left Eating with RIght hand. Have reviewed outpatient OT and PT notes.  OT indicates nine-hole peg test greater than 42 seconds Pain Inventory Average Pain 0 Pain Right Now 0 My pain is na  In the last 24 hours, has pain interfered with the following? General activity 0 Relation with others 0 Enjoyment of life 0 What TIME of day is your pain at its worst? na Sleep (in general) Good  Pain is worse with: na Pain improves with: na Relief from Meds: 0  Mobility walk without assistance how many minutes can you walk? 10 ability to climb steps?  yes do you drive?  no  Function not employed: date last employed na Do you have any goals in this area?  no  Neuro/Psych No problems in this area  Prior Studies Any changes since last visit?  no  Physicians involved in your care Any changes since last visit?  no   Family History  Problem Relation Age of Onset  . Cancer Mother   . Cancer Maternal Aunt   . Cancer  Maternal Grandmother    Social History   Socioeconomic History  . Marital status: Married    Spouse name: Not on file  . Number of children: Not on file  . Years of education: Not on file  . Highest education level: Not on file  Occupational History  . Not on file  Social Needs  . Financial resource strain: Not on file  . Food insecurity:    Worry: Not on file    Inability: Not on file  . Transportation needs:    Medical: Not on file    Non-medical: Not on file  Tobacco Use  . Smoking status: Never Smoker  . Smokeless tobacco: Never Used  Substance and Sexual Activity  . Alcohol use: No    Comment: occasional wine  . Drug use: No  . Sexual activity: Yes    Partners: Male    Birth control/protection: Surgical  Lifestyle  . Physical activity:    Days per week: Not on file    Minutes per session: Not on file  . Stress: Not on file  Relationships  . Social connections:    Talks on phone: Not on file    Gets together: Not on file    Attends religious service: Not on file    Active member of club or organization: Not on file    Attends meetings of clubs or organizations: Not on file    Relationship status: Not on file  Other Topics Concern  .  Not on file  Social History Narrative  . Not on file   Past Surgical History:  Procedure Laterality Date  . BACK SURGERY     scoliosis age 43  . BILATERAL SALPINGECTOMY Bilateral 05/05/2016   Procedure: BILATERAL SALPINGECTOMY;  Surgeon: Janyth Contes, MD;  Location: [facility];  Service: Obstetrics;  Laterality: Bilateral;  . CESAREAN SECTION    . CESAREAN SECTION N/A 05/05/2016   Procedure: CESAREAN SECTION;  Surgeon: Janyth Contes, MD;  Location: [facility] [place];  Service: Obstetrics;  Laterality: N/A;  . CHOLECYSTECTOMY    . HAND SURGERY    . HERNIA REPAIR    . KIDNEY STONE SURGERY     R--surgery; L lithotripsy.  Dr. Gaynelle Arabian   Past Medical History:  Diagnosis Date  . ADD (attention deficit  disorder)   . Exercise-induced asthma   . Hx of varicella   . Kidney stones   . S/P cesarean section 05/05/2016  . S/P tubal ligation 05/05/2016  . Stroke ([place])   . Term pregnancy, repeat 05/04/2016   BP 124/80   Pulse 81   Temp 98.3 F (36.8 C)   Ht 5\' 4"  (1.626 m)   Wt 172 lb (78 kg)   SpO2 96%   BMI 29.52 kg/m   Opioid Risk Score:   Fall Risk Score:  `1  Depression screen PHQ 2/9  Depression screen [facility] 2/9 04/10/2019 03/13/2019 02/22/2019 02/05/2019 06/06/2017  Decreased Interest 0 0 0 0 0  Down, Depressed, Hopeless 0 0 0 0 0  PHQ - 2 Score 0 0 0 0 0    Review of Systems  Constitutional: Negative.   HENT: Negative.   Eyes: Negative.   Respiratory: Negative.   Cardiovascular: Negative.   Gastrointestinal: Negative.   Endocrine: Negative.   Genitourinary: Negative.   Musculoskeletal: Negative.   Skin: Negative.   Allergic/Immunologic: Negative.   Neurological: Negative.   Hematological: Negative.   Psychiatric/Behavioral: Negative.   All other systems reviewed and are negative.      Objective:   Physical Exam Visual fields are intact confrontation testing Patient has nystagmus looking to the left side with lateral gaze No diplopia elicited Neck is no tenderness palpation Thoracic and lumbar spine have no tenderness she does have a right dextroscoliosis in the lower thoracic area No tenderness palpation the lumbar spine She has good lumbar flexion mildly limited with lumbar extension and lateral bending Negative straight leg raising Motor strength is 5/5 bilateral deltoid bicep tricep grip hip flexion extension ankle dorsiflexion There is mild dysdiadochokinesis with rapid alternating supination pronation right upper extremity Right finger thumb opposition is mildly slowed compared to left side There is decreased rate of toe tapping approximately 50% slower on the right side than on the left side. Ambulates without assistive device no evidence of toe drag or knee  instability She is able to toe walk as well as heel walk but has difficulty with tandem gait Mild limb ataxia  Mild dysmetria right finger-nose-finger only     Assessment & Plan:  1.  Right vertebral artery dissection, likely traumatic although the patient does not recall any specific incident. She has residual right-sided fine motor deficits as well as mild ataxia Overall we discussed that she has had a very good recovery.  We also discussed that she should still have significant improvements over the next several months. I will reevaluate her after she finishes her outpatient therapy in early July.  In regards to her driving she will first need to  get clearance from neuro-ophthalmology then I think she can proceed to a gradual return to driving as outlined below  Graduated return to driving instructions were provided. It is recommended that the patient first drives with another licensed driver in an empty parking lot. If the patient does well with this, and they can drive on a quiet street with the licensed driver. If the patient does well with this they can drive on a busy street with a licensed driver. If the patient does well with this, the next time out they can go by himself. For the first month after resuming driving, I recommend no nighttime or Interstate driving.

## 2019-04-10 NOTE — Therapy (Signed)
[facility] [address] [place], [place], [address] Phone: [phone]   Fax:  [phone]  Occupational Therapy Treatment  Patient Details  Name: [name] MRN: [id number] Date of Birth: 1981/11/06 Referring Provider (OT): Dr. Letta Pate   Encounter Date: 04/10/2019  OT End of Session - 04/10/19 1018    Visit Number  12    Number of Visits  [place] (23 visit limit for OT)    Authorization - Visit Number  12    Authorization - Number of Visits  23    OT Start Time  1829    OT Stop Time  1100    OT Time Calculation (min)  41 min    Activity Tolerance  Patient tolerated treatment well    Behavior During Therapy  WFL for tasks assessed/performed       Past Medical History:  Diagnosis Date  . ADD (attention deficit disorder)   . Exercise-induced asthma   . Hx of varicella   . Kidney stones   . S/P cesarean section 05/05/2016  . S/P tubal ligation 05/05/2016  . Stroke ([place])   . Term pregnancy, repeat 05/04/2016    Past Surgical History:  Procedure Laterality Date  . BACK SURGERY     scoliosis age 49  . BILATERAL SALPINGECTOMY Bilateral 05/05/2016   Procedure: BILATERAL SALPINGECTOMY;  Surgeon: Janyth Contes, MD;  Location: [place];  Service: Obstetrics;  Laterality: Bilateral;  . CESAREAN SECTION    . CESAREAN SECTION N/A 05/05/2016   Procedure: CESAREAN SECTION;  Surgeon: Janyth Contes, MD;  Location: [facility];  Service: Obstetrics;  Laterality: N/A;  . CHOLECYSTECTOMY    . HAND SURGERY    . HERNIA REPAIR    . KIDNEY STONE SURGERY     R--surgery; L lithotripsy.  Dr. Gaynelle Arabian    There were no vitals filed for this visit.  Subjective Assessment - 04/10/19 1019    Subjective   Pt reports that vision improved over the last week    Pertinent History  Rt cerebellar and Bil PCA infarcts secondary to Rt verterbral artery dissection on 01/02/19. PMH:  ADHD, panic attacks, ex induced asthma    Limitations  **fall risk, no driving, diplopia    Currently in Pain?  No/denies        CLINIC OPERATION CHANGES: [facility] is operating at a low capacity due to COVID-19.  The patient was brought into the clinic for evaluation and/or treatment following universal masking by staff, patient masking, social distancing, and <10 people in the clinic.  The patient's COVID risk of complications score is 1.   Visual tracking inferiorly and divergence with focus on increasing range where pt can merge images (back and down).  Min cueing to avoid head tilt.  Pt demo improved ability to maintain 1 image.   In standing, L foot on balance dome to incr wt. Shift to the right while performing functional reaching RUE to place/remove clothespins with 1-8lb resistance with with min difficulty for coordination (cueing for 3point instead of lateral pinch) and min-mod cueing for R shoulder hike with reach, improved with cueing and repetition.    In tall kneeling, rolling ball forward/back for AAROM shoulder flex and incr core/scapular stability.    Writing 3 sentences with min difficulty/incr time.  Pt has more difficulty with "s" and therefore practiced just "s."    OT Education - 04/10/19 1048    Education  Details  [employer] HEP    Person(s) Educated  Patient    Methods  Explanation;Demonstration;Handout;Verbal cues    Comprehension  Verbalized understanding;Returned demonstration;Verbal cues required       OT Short Term Goals - 03/23/19 1045      OT SHORT TERM GOAL #1   Title  Independent w/ coordination HEP - 02/15/19    Time  4    Period  Weeks    Status  Achieved      OT SHORT TERM GOAL #2   Title  Independent with diplopia HEP     Time  4    Period  Weeks    Status  Achieved      OT SHORT TERM GOAL #3   Title  Improve fine motor coordination Rt hand as evidenced by performing 9 hole peg test in 42 sec or under     Baseline  49.41 sec    Time  6    Period  Weeks    Status  On-going   03/23/19 1 min 17 secs     OT SHORT TERM GOAL #4   Title  Pt to make snack from standing level w/ supervision    Time  4    Period  Weeks    Status  Achieved      OT SHORT TERM GOAL #5   Title  Pt to perform light household tasks (laundry, Manufacturing engineer) w/ counter support x 10 min. w/o rest or LOB    Time  4    Period  Weeks    Status  Achieved      OT SHORT TERM GOAL #6   Title  Pt to read 1 line of enlarged text w/ improvements in diplopia per pt report    Period  Weeks    Status  Achieved      OT SHORT TERM GOAL #7   Title  Pt will report using RUE to assist with ADLs/Functional activity at least 75% of the time.    Baseline  currently using RUE 50% of the time    Time  6    Period  Weeks    Status  New      OT SHORT TERM GOAL #8   Title  Pt will report diplopia with functional activities and environmental scanning 25% or less of the time.    Baseline  currently diplopia 40% of the time with functional activity    Time  6    Period  Weeks    Status  New      OT SHORT TERM GOAL  #9   TITLE  Pt will demonstrate ability to write a short paragraph in a reasonable maount of time with 100% legibility    Baseline  Writes a sentence with 100% legibility and increased time    Time  6    Period  Weeks    Status  New        OT Long Term Goals - 03/23/19 1055      OT LONG TERM GOAL #1   Title  Independent with updated HEP prn - 03/18/19    Time  8    Period  Weeks    Status  Achieved      OT LONG TERM GOAL #2   Title  Pt to return to light cooking tasks for 20 min. w/ distant supervision using RUE at least 90% of the time    Time  12    Period  Weeks    Status  On-going   03/23/19 , uses RUE 50% of time     OT LONG TERM GOAL #3   Title  Pt to return to moderately complex household tasks w/ distant supervision using RUE at least 90% of the time.    Time  12    Period  Weeks     Status  Revised   03/23/19 uses RUE 75% of the time     OT LONG TERM GOAL #4   Title  Pt to improve grip strength Rt dominant hand by 10 lbs     Baseline  65 lbs    Time  12    Period  Weeks    Status  On-going   68, 76, 64- improved but not consistent     OT LONG TERM GOAL #5   Title  Pt to improve fine motor coordination Rt hand as evidenced by reducing speed on 9 hole peg test to 35 sec. or less    Baseline  49.41 sec    Time  12    Period  Weeks    Status  On-going   1 min 17 secs     Long Term Additional Goals   Additional Long Term Goals  Yes      OT LONG TERM GOAL #6   Title  Pt able to read paragraph of text w/ minimal diplopia    Time  8    Period  Weeks    Status  Achieved   Pt is able to position her head so that she does not have diplopia     OT LONG TERM GOAL #7   Title  Pt demo improved coordination to be able to pour liquid with RUE without spills.    Status  Achieved      OT LONG TERM GOAL #8   Title  Pt will demonstrate ability to ambulate while carrying a beverage in RUE without spilling.    Baseline  unable to carry a beverage in RUE without spills.    Time  12    Period  Weeks    Status  New      OT LONG TERM GOAL  #9   TITLE  Pt will demonstrate adequate RUE contol to retrieve a 5 lbs weight from mid level shelf with good control.    Time  12    Period  Weeks    Status  New            Plan - 04/10/19 1652    Clinical Impression Statement  Pt demo/reports continued improvements with vision with wider range where she can maintain 1 image.   Pt also demo improving RUE control.    Occupational Profile and client history currently impacting functional performance  Pt is wife and mother of 4 children. Current deficits affect ability to perform ADLS, activities, life roles. PMH: ADHD, panic attacks, exercise induced asthma    Occupational performance deficits (Please refer to evaluation for details):  ADL's;IADL's;Leisure;Other    Body Structure /  Function / Physical Skills  UE functional use;ROM;FMC;Mobility;Vision;Dexterity;Strength;Endurance;Coordination;IADL;Decreased knowledge of precautions;Decreased knowledge of use of DME    Rehab Potential  Good    OT Frequency  --   14 visits over 12 weeks, currently seeing 1x week   OT Duration  12 weeks    OT Treatment/Interventions  Self-care/ADL training;Therapeutic exercise;Visual/perceptual remediation/compensation;Coping strategies training;Neuromuscular education;Patient/family education;Therapeutic activities;Functional Mobility Training;DME and/or AE instruction;Passive range of motion;Aquatic Therapy;Moist Heat;Fluidtherapy;Balance training;Gait Training;Cryotherapy;Energy  conservation;Manual Therapy    Plan  continue with RUE strength/control, visual-perceptual skills and avoiding postural compensation    OT Home Exercise Plan  Education provided:  pt has coordination HEP, putty,  and diplopia HEP.     Medbridge Access Code:  [email or web address]     Consulted and Agree with Plan of Care  Patient       Patient will benefit from skilled therapeutic intervention in order to improve the following deficits and impairments:  Body Structure / Function / Physical Skills  Visit Diagnosis: Muscle weakness (generalized)  Unsteadiness on feet  Other abnormalities of gait and mobility  Visuospatial deficit  Other lack of coordination    Problem List Patient Active Problem List   Diagnosis Date Noted  . Vertebral artery dissection ([place])   . Elevated blood pressure reading   . Major depressive disorder   . Panic disorder   . CVA (cerebral vascular accident) ([place]) 01/02/2019  . Acute ischemic stroke ([place]) 01/02/2019  . S/P cesarean section 05/05/2016  . S/P tubal ligation 05/05/2016  . Term pregnancy, repeat 05/04/2016  . Unspecified vitamin D deficiency 12/18/2012  . ADHD (attention deficit hyperactivity disorder) 11/26/2011  . Dysthymia 11/26/2011    [facility] 04/10/2019, 4:52  PM  [facility] [address] [place], [place], [address] Phone: [phone]   Fax:  [phone]  Name: [name] MRN: [id number] Date of Birth: 1981/03/17   Vianne Bulls, OTR/L [facility] [address] [place], [place]  [address] [phone] phone [phone] 04/10/19 4:53 PM

## 2019-04-17 ENCOUNTER — Ambulatory Visit: Payer: 59 | Admitting: Occupational Therapy

## 2019-04-17 ENCOUNTER — Other Ambulatory Visit: Payer: Self-pay

## 2019-04-17 ENCOUNTER — Encounter: Payer: Self-pay | Admitting: Occupational Therapy

## 2019-04-17 ENCOUNTER — Encounter: Payer: Self-pay | Admitting: Rehabilitative and Restorative Service Providers"

## 2019-04-17 ENCOUNTER — Ambulatory Visit: Payer: 59 | Admitting: Rehabilitative and Restorative Service Providers"

## 2019-04-17 DIAGNOSIS — M6281 Muscle weakness (generalized): Secondary | ICD-10-CM

## 2019-04-17 DIAGNOSIS — R2681 Unsteadiness on feet: Secondary | ICD-10-CM

## 2019-04-17 DIAGNOSIS — R278 Other lack of coordination: Secondary | ICD-10-CM

## 2019-04-17 DIAGNOSIS — I7774 Dissection of vertebral artery: Secondary | ICD-10-CM | POA: Diagnosis not present

## 2019-04-17 DIAGNOSIS — R2689 Other abnormalities of gait and mobility: Secondary | ICD-10-CM

## 2019-04-17 DIAGNOSIS — R41842 Visuospatial deficit: Secondary | ICD-10-CM

## 2019-04-17 NOTE — Therapy (Signed)
[facility] [address] [place], [place], [address] Phone: [phone]   Fax:  [phone]  Occupational Therapy Treatment  Patient Details  Name: [name] MRN: [id number] Date of Birth: 1981-02-27 Referring Provider (OT): Dr. Letta Pate   Encounter Date: 04/17/2019  OT End of Session - 04/17/19 1029    Visit Number  13    Number of Visits  23    Date for OT Re-Evaluation  06/15/19    Authorization Type  [employer] (23 visit limit for OT)    Authorization - Visit Number  13    Authorization - Number of Visits  23    OT Start Time  1021    OT Stop Time  1100    OT Time Calculation (min)  39 min    Activity Tolerance  Patient tolerated treatment well    Behavior During Therapy  WFL for tasks assessed/performed       Past Medical History:  Diagnosis Date  . ADD (attention deficit disorder)   . Exercise-induced asthma   . Hx of varicella   . Kidney stones   . S/P cesarean section 05/05/2016  . S/P tubal ligation 05/05/2016  . Stroke ([place])   . Term pregnancy, repeat 05/04/2016    Past Surgical History:  Procedure Laterality Date  . BACK SURGERY     scoliosis age 18  . BILATERAL SALPINGECTOMY Bilateral 05/05/2016   Procedure: BILATERAL SALPINGECTOMY;  Surgeon: Janyth Contes, MD;  Location: [facility];  Service: Obstetrics;  Laterality: Bilateral;  . CESAREAN SECTION    . CESAREAN SECTION N/A 05/05/2016   Procedure: CESAREAN SECTION;  Surgeon: Janyth Contes, MD;  Location: [facility];  Service: Obstetrics;  Laterality: N/A;  . CHOLECYSTECTOMY    . HAND SURGERY    . HERNIA REPAIR    . KIDNEY STONE SURGERY     R--surgery; L lithotripsy.  Dr. Gaynelle Arabian    There were no vitals filed for this visit.  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 PMH: ADHD,  panic attacks, ex induced asthma    Limitations  **fall risk, no driving, diplopia    Currently in Pain?  No/denies         In supine, scapular depression and retraction with chest lift, min v.c.  In sidelying, trunk rotation with scapular depression with RUE ER/abduction.  In prone, scapular retraction with shoulders in extension, elbows bent 90*, abduction, flex with min cueing, then wt. Shifting on elbows with lateral trunk flex (to both sides), min cueing.  In standing, functional reaching to place small pegs in vertical pegboard for incr coordination, and visual demand with min-mod difficulty/cues for compensation patterns.  ER Doorway stretch with elbow by side with towel next to body to facilitate scapular depression/retraction   Diagonals with red theraband in standing with scapular depression (D1 pattern) with min cueing.       OT Short Term Goals - 04/17/19 1354      OT SHORT TERM GOAL #1   Title  Independent w/ coordination HEP - 02/15/19    Time  4    Period  Weeks    Status  Achieved      OT SHORT TERM GOAL #2   Title  Independent with diplopia HEP     Time  4    Period  Weeks  Status  Achieved      OT SHORT TERM GOAL #3   Title  Improve fine motor coordination Rt hand as evidenced by performing 9 hole peg test in 42 sec or under--check updated STGs 05/04/19    Baseline  49.41 sec    Time  6    Period  Weeks    Status  On-going   03/23/19 1 min 17 secs     OT SHORT TERM GOAL #4   Title  Pt to make snack from standing level w/ supervision    Time  4    Period  Weeks    Status  Achieved      OT SHORT TERM GOAL #5   Title  Pt to perform light household tasks (laundry, Manufacturing engineer) w/ counter support x 10 min. w/o rest or LOB    Time  4    Period  Weeks    Status  Achieved      OT SHORT TERM GOAL #6   Title  Pt to read 1 line of enlarged text w/ improvements in diplopia per pt report    Period  Weeks    Status  Achieved      OT  SHORT TERM GOAL #7   Title  Pt will report using RUE to assist with ADLs/Functional activity at least 75% of the time.    Baseline  currently using RUE 50% of the time    Time  6    Period  Weeks    Status  New      OT SHORT TERM GOAL #8   Title  Pt will report diplopia with functional activities and environmental scanning 25% or less of the time.    Baseline  currently diplopia 40% of the time with functional activity    Time  6    Period  Weeks    Status  New      OT SHORT TERM GOAL  #9   TITLE  Pt will demonstrate ability to write a short paragraph in a reasonable maount of time with 100% legibility    Baseline  Writes a sentence with 100% legibility and increased time    Time  6    Period  Weeks    Status  New        OT Long Term Goals - 04/17/19 1355      OT LONG TERM GOAL #1   Title  Independent with updated HEP prn - 03/18/19    Time  8    Period  Weeks    Status  Achieved      OT LONG TERM GOAL #2   Title  Pt to return to light cooking tasks for 20 min. w/ distant supervision using RUE at least 90% of the time--check updated LTGs 06/15/19    Time  12    Period  Weeks    Status  On-going   03/23/19 , uses RUE 50% of time     OT LONG TERM GOAL #3   Title  Pt to return to moderately complex household tasks w/ distant supervision using RUE at least 90% of the time.    Time  12    Period  Weeks    Status  Revised   03/23/19 uses RUE 75% of the time     OT LONG TERM GOAL #4   Title  Pt to improve grip strength Rt dominant hand by 10 lbs     Baseline  65 lbs  Time  12    Period  Weeks    Status  On-going   29, 76, 64- improved but not consistent     OT LONG TERM GOAL #5   Title  Pt to improve fine motor coordination Rt hand as evidenced by reducing speed on 9 hole peg test to 35 sec. or less    Baseline  49.41 sec    Time  12    Period  Weeks    Status  On-going   1 min 17 secs     OT LONG TERM GOAL #6   Title  Pt able to read paragraph of text w/ minimal  diplopia    Time  8    Period  Weeks    Status  Achieved   Pt is able to position her head so that she does not have diplopia     OT LONG TERM GOAL #7   Title  Pt demo improved coordination to be able to pour liquid with RUE without spills.    Status  Achieved      OT LONG TERM GOAL #8   Title  Pt will demonstrate ability to ambulate while carrying a beverage in RUE without spilling.    Baseline  unable to carry a beverage in RUE without spills.    Time  12    Period  Weeks    Status  New      OT LONG TERM GOAL  #9   TITLE  Pt will demonstrate adequate RUE contol to retrieve a 5 lbs weight from mid level shelf with good control.    Time  12    Period  Weeks    Status  New            Plan - 04/17/19 1352    Clinical Impression Statement  Pt continues to progress towards goals with improving RUE control and vision.  Pt continues to need cueing for compensatory patterns wih RUE functional use.      Occupational Profile and client history currently impacting functional performance  Pt is wife and mother of 4 children. Current deficits affect ability to perform ADLS, activities, life roles. PMH: ADHD, panic attacks, exercise induced asthma    Occupational performance deficits (Please refer to evaluation for details):  ADL's;IADL's;Leisure;Other    Body Structure / Function / Physical Skills  UE functional use;ROM;FMC;Mobility;Vision;Dexterity;Strength;Endurance;Coordination;IADL;Decreased knowledge of precautions;Decreased knowledge of use of DME    Rehab Potential  Good    OT Frequency  --   14 visits over 12 weeks, currently seeing 1x week   OT Duration  12 weeks    OT Treatment/Interventions  Self-care/ADL training;Therapeutic exercise;Visual/perceptual remediation/compensation;Coping strategies training;Neuromuscular education;Patient/family education;Therapeutic activities;Functional Mobility Training;DME and/or AE instruction;Passive range of motion;Aquatic Therapy;Moist  Heat;Fluidtherapy;Balance training;Gait Training;Cryotherapy;Energy conservation;Manual Therapy    Plan  continue with RUE strength/control, visual-perceptual skills and avoiding postural compensation    OT Home Exercise Plan  Education provided:  pt has coordination HEP, putty,  and diplopia HEP.     Medbridge Access Code:  [email or web address]     Consulted and Agree with Plan of Care  Patient       Patient will benefit from skilled therapeutic intervention in order to improve the following deficits and impairments:  Body Structure / Function / Physical Skills  Visit Diagnosis: Muscle weakness (generalized)  Unsteadiness on feet  Other abnormalities of gait and mobility  Visuospatial deficit  Other lack of coordination    Problem List Patient Active Problem List  Diagnosis Date Noted  . Vertebral artery dissection ([place])   . Elevated blood pressure reading   . Major depressive disorder   . Panic disorder   . CVA (cerebral vascular accident) ([place]) 01/02/2019  . Acute ischemic stroke ([place]) 01/02/2019  . S/P cesarean section 05/05/2016  . S/P tubal ligation 05/05/2016  . Term pregnancy, repeat 05/04/2016  . Unspecified vitamin D deficiency 12/18/2012  . ADHD (attention deficit hyperactivity disorder) 11/26/2011  . Dysthymia 11/26/2011    [facility] 04/17/2019, 1:56 PM  [facility] [address] [place], [place], [address] Phone: [phone]   Fax:  [phone]  Name: HATSUMI STEINHART MRN: [id number] Date of Birth: 1981/09/03   Vianne Bulls, OTR/L [facility] [address] [place], [place]  [address] [phone] phone [phone] 04/17/19 1:56 PM

## 2019-04-17 NOTE — Therapy (Signed)
[facility] [address] [place], [place], [address] Phone: [phone]   Fax:  [phone]  Physical Therapy Treatment  Patient Details  Name: [name] MRN: [id number] Date of Birth: Dec 12, 1980 Referring Provider (PT): Dr. [name] Penna   Encounter Date: 04/17/2019  PT End of Session - 04/17/19 0937    Visit Number  13    Number of Visits  17    Date for PT Re-Evaluation  05/22/19    Authorization Type  UHC  (23 visit limit)    Authorization - Visit Number  13    Authorization - Number of Visits  23    PT Start Time  [phone]    PT Stop Time  1015    PT Time Calculation (min)  41 min    Activity Tolerance  Patient tolerated treatment well    Behavior During Therapy  [facility] for tasks assessed/performed       Past Medical History:  Diagnosis Date  . ADD (attention deficit disorder)   . Exercise-induced asthma   . Hx of varicella   . Kidney stones   . S/P cesarean section 05/05/2016  . S/P tubal ligation 05/05/2016  . Stroke ([place])   . Term pregnancy, repeat 05/04/2016    Past Surgical History:  Procedure Laterality Date  . BACK SURGERY     scoliosis age 63  . BILATERAL SALPINGECTOMY Bilateral 05/05/2016   Procedure: BILATERAL SALPINGECTOMY;  Surgeon: Janyth Contes, MD;  Location: [facility];  Service: Obstetrics;  Laterality: Bilateral;  . CESAREAN SECTION    . CESAREAN SECTION N/A 05/05/2016   Procedure: CESAREAN SECTION;  Surgeon: Janyth Contes, MD;  Location: [facility];  Service: Obstetrics;  Laterality: N/A;  . CHOLECYSTECTOMY    . HAND SURGERY    . HERNIA REPAIR    . KIDNEY STONE SURGERY     R--surgery; L lithotripsy.  Dr. Gaynelle Arabian    There were no vitals filed for this visit.  Subjective Assessment - 04/17/19 0936    Subjective  The patient reports she has been mowing the yard with a riding lawn mower at home.  She states it is going well.      Pertinent History   CVA, ADHD/ADD, panic attacks, exercise induced asthma    Patient Stated Goals  "Using my Right side" - feels as if it is weaker and discoordinated    Currently in Pain?  No/denies      CLINIC OPERATION CHANGES: [facility] is operating at a low capacity due to COVID-19.  The patient was brought into the clinic for evaluation and/or treatment following universal masking by staff, social distancing, and <10 people in the clinic.  The patient's COVID risk of complications score is 1.     [facility] PT Assessment - 04/17/19 0938      Functional Gait  Assessment   Gait assessed   Yes    Gait Level Surface  Walks 20 ft in less than 7 sec but greater than 5.5 sec, uses assistive device, slower speed, mild gait deviations, or deviates 6-10 in outside of the 12 in walkway width.    Change in Gait Speed  Able to change speed, demonstrates mild gait deviations, deviates 6-10 in outside of the 12 in walkway width, or no gait deviations, unable to achieve a major change in velocity, or uses a change in velocity, or uses an assistive device.    Gait with Horizontal Head Turns  Performs head  turns smoothly with slight change in gait velocity (eg, minor disruption to smooth gait path), deviates 6-10 in outside 12 in walkway width, or uses an assistive device.    Gait with Vertical Head Turns  Performs task with slight change in gait velocity (eg, minor disruption to smooth gait path), deviates 6 - 10 in outside 12 in walkway width or uses assistive device    Gait and Pivot Turn  Pivot turns safely within 3 sec and stops quickly with no loss of balance.    Step Over Obstacle  Is able to step over one shoe box (4.5 in total height) without changing gait speed. No evidence of imbalance.    Gait with Narrow Base of Support  Ambulates less than 4 steps heel to toe or cannot perform without assistance.    Gait with Eyes Closed  Walks 20 ft, uses assistive device, slower speed, mild gait  deviations, deviates 6-10 in outside 12 in walkway width. Ambulates 20 ft in less than 9 sec but greater than 7 sec.    Ambulating Backwards  Walks 20 ft, uses assistive device, slower speed, mild gait deviations, deviates 6-10 in outside 12 in walkway width.    Steps  Alternating feet, must use rail.    Total Score  19    FGA comment:  19/30, improved from 13/30                   [facility] Adult PT Treatment/Exercise - 04/17/19 5053      Ambulation/Gait   Ambulation/Gait  Yes    Ambulation/Gait Assistance  6: Modified independent (Device/Increase time)    Ambulation/Gait Assistance Details  Patient ambulated with tactile cues to initiate R hip anteriorly over R toes during mid stance phase of gait.  Ambulated x 400 feet. Also provided tactile cues for R shoulder retraction and movement in transverse plane during gait.    Assistive device  None    Ambulation Surface  Level;Indoor    Gait velocity  3.15 ft/sec      Neuro Re-ed    Neuro Re-ed Details   BOSU standing wiht mini squats x 10 reps, standing jumping in place bilaterally, attempted out/in like LE jumping jack movement x 5 reps with ataxia noted.  Single leg hopping R foot near support surface with UE support x 10 reps.  Standing forward/backwards jumping.  Single leg stance R leg activities.  Gliding on floor using pilllow cases for coordination and balance.      Exercises   Exercises  Other Exercises    Other Exercises   Standing scapular retraction added during gait emphasizing R shoulder retraction. Seated scalene stretch using belt for first rib depression R side.        Manual Therapy   Manual Therapy  Soft tissue mobilization;Scapular mobilization    Manual therapy comments  L sidelying    Soft tissue mobilization  R scalenes, R upper trap, R parascapular musculature    Scapular Mobilization  supine soft tissue mobilization near R scapula/romboid region with diagonal movement.               PT Short Term  Goals - 04/17/19 1009      PT SHORT TERM GOAL #1   Title  The patient will be indep with HEP for progression of balance, dynamic gait activities.  (STG due date is 04/22/19)    Time  4    Period  Weeks    Target Date  04/22/19  PT SHORT TERM GOAL #2   Title  The patient will improve gait speed from 2.72 ft/sec to > or equal to 3.2 ft/sec to demo improving functional mobility.    Baseline  3.15 ft/sec    Time  4    Period  Weeks    Target Date  04/22/19      PT SHORT TERM GOAL #3   Title  The patient will maintain single leg stance x 10 seconds to demo improved control of narrow base of support standing.     Baseline  Up to 5-8 seconds with multiple reps today.    Time  4    Period  Weeks    Target Date  04/22/19      PT SHORT TERM GOAL #4   Title  The patient will improve FGA from 18/30 to > or equal to 21/30 to demonstrate improved dynamic gait.    Baseline  19/30 on 04/17/19    Time  4    Period  Weeks    Target Date  04/22/19        PT Long Term Goals - 03/24/19 1140      PT LONG TERM GOAL #1   Title  The patient will verbalize understanding of post d/c HEP and wellness options for ongoing physical activity.  (LTG due date 05/22/19)    Time  8    Period  Weeks    Target Date  05/22/19      PT LONG TERM GOAL #2   Title  The patient will improve Berg balance score to > or equal to 55/56 to demo improved balance control.    Time  8    Period  Weeks    Target Date  05/22/19      PT LONG TERM GOAL #3   Title  The patient will improve FGA to > or equal to24/30 to demo dec'ing risk for falls.    Time  8    Period  Weeks    Target Date  05/22/19      PT LONG TERM GOAL #4   Title  patient to demonstrate gait over various surfaces and inclines mod indep x 2000 ft nonstop.    Time  8    Period  Weeks    Target Date  05/22/19      PT LONG TERM GOAL #5   Title  The patient will improve gait speed to > or equal to 3.5 ft/sec to demo improved functional mobility.     Time  8    Period  Weeks    Target Date  05/22/19      Additional Long Term Goals   Additional Long Term Goals  Yes      PT LONG TERM GOAL #6   Title  The patient will be able to ambulate indoors/outdoors while carrying her 36 year old to return to independence in her life roles.    Time  8    Period  Weeks    Target Date  05/22/19            Plan - 04/17/19 1318    Clinical Impression Statement  PT began checking short term goals and patient has partially  met STGs.  Plan to continue progressing to unmet STGs and LTGs.  Patient making continued gain with physical therapy and progressing with household/community mobility.    PT Treatment/Interventions  ADLs/Self Care Home Management;Cryotherapy;Electrical Stimulation;Functional mobility training;Stair training;Gait training;DME Instruction;Moist Heat;Therapeutic activities;Therapeutic exercise;Balance training;Neuromuscular  re-education;Patient/family education;Orthotic Fit/Training;Passive range of motion;Manual techniques;Taping;Vestibular    PT Next Visit Plan  update STGs, continue to LTGs, compliant surface, R ankle control, R stance activities, R hip initiating during mid stance, gait speed, community gait, dynamic gait.    Consulted and Agree with Plan of Care  Patient       Patient will benefit from skilled therapeutic intervention in order to improve the following deficits and impairments:  Abnormal gait, Decreased balance, Decreased mobility, Decreased strength, Decreased coordination, Difficulty walking, Decreased safety awareness  Visit Diagnosis: Muscle weakness (generalized)  Unsteadiness on feet  Other abnormalities of gait and mobility     Problem List Patient Active Problem List   Diagnosis Date Noted  . Vertebral artery dissection ([place])   . Elevated blood pressure reading   . Major depressive disorder   . Panic disorder   . CVA (cerebral vascular accident) ([place]) 01/02/2019  . Acute ischemic stroke  ([place]) 01/02/2019  . S/P cesarean section 05/05/2016  . S/P tubal ligation 05/05/2016  . Term pregnancy, repeat 05/04/2016  . Unspecified vitamin D deficiency 12/18/2012  . ADHD (attention deficit hyperactivity disorder) 11/26/2011  . Dysthymia 11/26/2011    Michale Emmerich , PT 04/17/2019, 1:19 PM  [facility] [address] [place], [place], [address] Phone: [phone]   Fax:  [phone]  Name: [name] MRN: [id number] Date of Birth: 1981-05-26

## 2019-04-25 ENCOUNTER — Other Ambulatory Visit: Payer: Self-pay

## 2019-04-25 ENCOUNTER — Ambulatory Visit: Payer: 59 | Admitting: Occupational Therapy

## 2019-04-25 ENCOUNTER — Ambulatory Visit: Payer: 59 | Attending: Physical Medicine & Rehabilitation | Admitting: Rehabilitative and Restorative Service Providers"

## 2019-04-25 ENCOUNTER — Encounter: Payer: Self-pay | Admitting: Rehabilitative and Restorative Service Providers"

## 2019-04-25 ENCOUNTER — Encounter: Payer: Self-pay | Admitting: Occupational Therapy

## 2019-04-25 DIAGNOSIS — R41842 Visuospatial deficit: Secondary | ICD-10-CM | POA: Insufficient documentation

## 2019-04-25 DIAGNOSIS — R2689 Other abnormalities of gait and mobility: Secondary | ICD-10-CM | POA: Diagnosis present

## 2019-04-25 DIAGNOSIS — R278 Other lack of coordination: Secondary | ICD-10-CM | POA: Insufficient documentation

## 2019-04-25 DIAGNOSIS — R2681 Unsteadiness on feet: Secondary | ICD-10-CM

## 2019-04-25 DIAGNOSIS — M6281 Muscle weakness (generalized): Secondary | ICD-10-CM | POA: Diagnosis not present

## 2019-04-25 NOTE — Therapy (Signed)
[facility] [address] [place], [place], [address] Phone: [phone]   Fax:  [phone]  Occupational Therapy Treatment  Patient Details  Name: [name] MRN: [id number] Date of Birth: 1981/04/27 Referring Provider (OT): Dr. Letta Pate   Encounter Date: 04/25/2019  OT End of Session - 04/25/19 0907    Visit Number  14    Number of Visits  23    Date for OT Re-Evaluation  06/15/19    Authorization Type  [employer] (23 visit limit for OT)    Authorization - Visit Number  14    Authorization - Number of Visits  23    OT Start Time  [phone]    OT Stop Time  0945    OT Time Calculation (min)  42 min    Activity Tolerance  Patient tolerated treatment well    Behavior During Therapy  [facility] for tasks assessed/performed       Past Medical History:  Diagnosis Date  . ADD (attention deficit disorder)   . Exercise-induced asthma   . Hx of varicella   . Kidney stones   . S/P cesarean section 05/05/2016  . S/P tubal ligation 05/05/2016  . Stroke ([place])   . Term pregnancy, repeat 05/04/2016    Past Surgical History:  Procedure Laterality Date  . BACK SURGERY     scoliosis age 79  . BILATERAL SALPINGECTOMY Bilateral 05/05/2016   Procedure: BILATERAL SALPINGECTOMY;  Surgeon: Janyth Contes, MD;  Location: [facility];  Service: Obstetrics;  Laterality: Bilateral;  . CESAREAN SECTION    . CESAREAN SECTION N/A 05/05/2016   Procedure: CESAREAN SECTION;  Surgeon: Janyth Contes, MD;  Location: [facility];  Service: Obstetrics;  Laterality: N/A;  . CHOLECYSTECTOMY    . HAND SURGERY    . HERNIA REPAIR    . KIDNEY STONE SURGERY     R--surgery; L lithotripsy.  Dr. Gaynelle Arabian    There were no vitals filed for this visit.  Subjective Assessment - 04/25/19 0906    Subjective   05/04/19 eye doctor appt.  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 PMH: ADHD, panic attacks, ex induced asthma    Limitations  **fall risk, no driving, diplopia    Currently in Pain?  No/denies         CLINIC OPERATION CHANGES: Outpatient Neuro Rehab is open at lower capacity following universal masking, social distancing, and patient screening.  The patient's COVID risk of complications score is 1.    In standing, performing 44M number cancellation with RUE for incr coordination, balance, R shoulder stability and visual scanning with mod cueing for weight shift to the R, decr shoulder hike and head tilt/partial closure of R eye.    Pt instructed to continue Visual tracking inferiorly and divergence with focus on increasing range where pt can merge images (back and down) and that due to progress may need to tape letter on wall and move away from letter slowly keeping image one as length of arm distance is improved.  Pt continues to    In quadruped, forward/back weight shifts, lower trunk rotations, upper trunk rotation with shoulder abduction.  In tall kneeling, diagonals with ball with trunk rotations with focus on scapular movement with min cueing.    In prone, scapular retraction with shoulders in ext, abduction, and ER with min cueing.  Discussed progress with  RUE functional use.  Pt reports using approx 50% of the time. Discussed opportunities for pt to incr functional use including with reaching, hanging up clothes, stirring, wiping/cleaning.  Emphasized importance of allowing herself time to use RUE.  Pt verbalized understanding.     OT Short Term Goals - 04/25/19 0935      OT SHORT TERM GOAL #1   Title  Independent w/ coordination HEP - 02/15/19    Time  4    Period  Weeks    Status  Achieved      OT SHORT TERM GOAL #2   Title  Independent with diplopia HEP     Time  4    Period  Weeks    Status  Achieved      OT SHORT TERM GOAL #3   Title  Improve fine motor coordination Rt hand as  evidenced by performing 9 hole peg test in 42 sec or under--check updated STGs 05/04/19    Baseline  49.41 sec    Time  6    Period  Weeks    Status  On-going   03/23/19 1 min 17 secs     OT SHORT TERM GOAL #4   Title  Pt to make snack from standing level w/ supervision    Time  4    Period  Weeks    Status  Achieved      OT SHORT TERM GOAL #5   Title  Pt to perform light household tasks (laundry, Manufacturing engineer) w/ counter support x 10 min. w/o rest or LOB    Time  4    Period  Weeks    Status  Achieved      OT SHORT TERM GOAL #6   Title  Pt to read 1 line of enlarged text w/ improvements in diplopia per pt report    Period  Weeks    Status  Achieved      OT SHORT TERM GOAL #7   Title  Pt will report using RUE to assist with ADLs/Functional activity at least 75% of the time.    Baseline  currently using RUE 50% of the time    Time  6    Period  Weeks    Status  On-going   04/25/19:  50% per pt report     OT SHORT TERM GOAL #8   Title  Pt will report diplopia with functional activities and environmental scanning 25% or less of the time.    Baseline  currently diplopia 40% of the time with functional activity    Time  6    Period  Weeks    Status  New      OT SHORT TERM GOAL  #9   TITLE  Pt will demonstrate ability to write a short paragraph in a reasonable maount of time with 100% legibility    Baseline  Writes a sentence with 100% legibility and increased time    Time  6    Period  Weeks    Status  New        OT Long Term Goals - 04/17/19 1355      OT LONG TERM GOAL #1   Title  Independent with updated HEP prn - 03/18/19    Time  8    Period  Weeks    Status  Achieved      OT LONG TERM GOAL #2   Title  Pt to return to light cooking tasks for 20 min.  w/ distant supervision using RUE at least 90% of the time--check updated LTGs 06/15/19    Time  12    Period  Weeks    Status  On-going   03/23/19 , uses RUE 50% of time     OT LONG TERM GOAL #3    Title  Pt to return to moderately complex household tasks w/ distant supervision using RUE at least 90% of the time.    Time  12    Period  Weeks    Status  Revised   03/23/19 uses RUE 75% of the time     OT LONG TERM GOAL #4   Title  Pt to improve grip strength Rt dominant hand by 10 lbs     Baseline  65 lbs    Time  12    Period  Weeks    Status  On-going   68, 76, 64- improved but not consistent     OT LONG TERM GOAL #5   Title  Pt to improve fine motor coordination Rt hand as evidenced by reducing speed on 9 hole peg test to 35 sec. or less    Baseline  49.41 sec    Time  12    Period  Weeks    Status  On-going   1 min 17 secs     OT LONG TERM GOAL #6   Title  Pt able to read paragraph of text w/ minimal diplopia    Time  8    Period  Weeks    Status  Achieved   Pt is able to position her head so that she does not have diplopia     OT LONG TERM GOAL #7   Title  Pt demo improved coordination to be able to pour liquid with RUE without spills.    Status  Achieved      OT LONG TERM GOAL #8   Title  Pt will demonstrate ability to ambulate while carrying a beverage in RUE without spilling.    Baseline  unable to carry a beverage in RUE without spills.    Time  12    Period  Weeks    Status  New      OT LONG TERM GOAL  #9   TITLE  Pt will demonstrate adequate RUE contol to retrieve a 5 lbs weight from mid level shelf with good control.    Time  12    Period  Weeks    Status  New            Plan - 04/25/19 1035    Clinical Impression Statement  Pt continues to progress towards goals with improving RUE control and vision.  Emphasized importance of forced RUE functional use with IADLs and allow herself extra time to complete.     Occupational Profile and client history currently impacting functional performance  Pt is wife and mother of 4 children. Current deficits affect ability to perform ADLS, activities, life roles. PMH: ADHD, panic attacks, exercise induced asthma     Occupational performance deficits (Please refer to evaluation for details):  ADL's;IADL's;Leisure;Other    Body Structure / Function / Physical Skills  UE functional use;ROM;FMC;Mobility;Vision;Dexterity;Strength;Endurance;Coordination;IADL;Decreased knowledge of precautions;Decreased knowledge of use of DME    Rehab Potential  Good    OT Frequency  --   14 visits over 12 weeks, currently seeing 1x week   OT Duration  12 weeks    OT Treatment/Interventions  Self-care/ADL training;Therapeutic exercise;Visual/perceptual remediation/compensation;Coping strategies training;Neuromuscular  education;Patient/family education;Therapeutic activities;Functional Mobility Training;DME and/or AE instruction;Passive range of motion;Aquatic Therapy;Moist Heat;Fluidtherapy;Balance training;Gait Training;Cryotherapy;Energy conservation;Manual Therapy    Plan  check STGs.  continue with RUE strength/control, visual-perceptual skills and avoiding postural compensation    OT Home Exercise Plan  Education provided:  pt has coordination HEP, putty,  and diplopia HEP.     Medbridge Access Code:  [email or web address]     Consulted and Agree with Plan of Care  Patient       Patient will benefit from skilled therapeutic intervention in order to improve the following deficits and impairments:  Body Structure / Function / Physical Skills  Visit Diagnosis: Muscle weakness (generalized)  Unsteadiness on feet  Other abnormalities of gait and mobility  Visuospatial deficit  Other lack of coordination    Problem List Patient Active Problem List   Diagnosis Date Noted  . Vertebral artery dissection ([place])   . Elevated blood pressure reading   . Major depressive disorder   . Panic disorder   . CVA (cerebral vascular accident) ([place]) 01/02/2019  . Acute ischemic stroke ([place]) 01/02/2019  . S/P cesarean section 05/05/2016  . S/P tubal ligation 05/05/2016  . Term pregnancy, repeat 05/04/2016  . Unspecified vitamin D deficiency  12/18/2012  . ADHD (attention deficit hyperactivity disorder) 11/26/2011  . Dysthymia 11/26/2011    [facility] 04/25/2019, 2:43 PM  [facility] [address] [place], [place], [address] Phone: [phone]   Fax:  [phone]  Name: [name] MRN: [id number] Date of Birth: 1981-10-17   Vianne Bulls, OTR/L [facility] [address] [place],   [address] [phone] phone [phone] 04/25/19 2:43 PM

## 2019-04-25 NOTE — Patient Instructions (Signed)
Access Code: [email or web address]  URL: [email or web address]  Date: 04/25/2019  Prepared by: Rudell Cobb   Program Notes  Begin program having a family member stand nearby for safety. As you are able to perform without loss of balance, you can begin to try on your own.   Exercises Half Tandem Stance Balance with Eyes Closed - 3 reps - 1 sets - 30 hold - 1x daily - 7x weekly Wide Stance with Head Nods on Foam Pad - 10 reps - 2 sets - 2x daily - 7x weekly Wide Stance with Eyes Closed on Foam Pad - 3 reps - 1 sets - 30 seconds hold - 1x daily - 7x weekly Foot Taps on Steps of Various Heights and Placements (BKA) - 10 reps - 3 sets - 1x daily - 7x weekly Standing Marching - 10 reps - 1 sets - 1x daily - 7x weekly Romberg Stance on Foam Pad with Head Rotation - 10 reps - 2 sets - 1x daily - 7x weekly Standing Quarter Turn - 10 reps - 3 sets - 1x daily - 7x weekly Seated Gaze Stabilization with Head Rotation - 1 reps - 1 sets - 2-3x daily - 7x weekly Seated Shoulder Flexion with Ball - 10 reps - 2 sets - 2 hold - 1x daily Seated Upper Trapezius Stretch - 3 reps - 1 sets - 20 seconds hold - 1x daily - 7x weekly Seated Levator Scapulae Stretch - 3 reps - 1 sets - 20 seconds hold - 1x daily - 7x weekly Supine Cervical Retraction with Towel - 10 reps - 1 sets - 1x daily - 7x weekly Quadruped Cat Camel - 10 reps - 1 sets - 5 hold - 1x daily Quadruped Rocking Slow - 10 reps - 5 hold - 1x daily Bird Dog - 10 reps - 5-10sec hold - 1x daily Long Sitting to Supine Vestibular Habituation - 5 reps - 1 sets - 2x daily - 7x weekly

## 2019-04-25 NOTE — Therapy (Signed)
[facility] [address] [place], [place], [address] Phone: [phone]   Fax:  [phone]  Physical Therapy Treatment  Patient Details  Name: [name] MRN: [id number] Date of Birth: [birth year] Referring Provider (PT): Dr. [name] Penna   Encounter Date: 04/25/2019  PT End of Session - 04/25/19 1454    Visit Number  14    Number of Visits  17    Date for PT Re-Evaluation  05/22/19    Authorization Type  UHC  (23 visit limit)    Authorization - Visit Number  14    Authorization - Number of Visits  23    PT Start Time  0938    PT Stop Time  1050    PT Time Calculation (min)  55 min    Activity Tolerance  Patient tolerated treatment well    Behavior During Therapy  [facility] for tasks assessed/performed       Past Medical History:  Diagnosis Date  . ADD (attention deficit disorder)   . Exercise-induced asthma   . Hx of varicella   . Kidney stones   . S/P cesarean section 05/05/2016  . S/P tubal ligation 05/05/2016  . Stroke ([place])   . Term pregnancy, repeat 05/04/2016    Past Surgical History:  Procedure Laterality Date  . BACK SURGERY     scoliosis age 85  . BILATERAL SALPINGECTOMY Bilateral 05/05/2016   Procedure: BILATERAL SALPINGECTOMY;  Surgeon: Janyth Contes, MD;  Location: [facility];  Service: Obstetrics;  Laterality: Bilateral;  . CESAREAN SECTION    . CESAREAN SECTION N/A 05/05/2016   Procedure: CESAREAN SECTION;  Surgeon: Janyth Contes, MD;  Location: [facility];  Service: Obstetrics;  Laterality: N/A;  . CHOLECYSTECTOMY    . HAND SURGERY    . HERNIA REPAIR    . KIDNEY STONE SURGERY     R--surgery; L lithotripsy.  Dr. Gaynelle Arabian    There were no vitals filed for this visit.  Subjective Assessment - 04/25/19 1453    Subjective  The patient inquires if she is getting better.    Pertinent History  CVA, ADHD/ADD, panic attacks, exercise induced asthma    Patient  Stated Goals  "Using my Right side" - feels as if it is weaker and discoordinated    Currently in Pain?  No/denies       CLINIC OPERATION CHANGES: Outpatient Neuro Rehab is open at lower capacity following universal masking, social distancing, and patient screening.  The patient's COVID risk of complications score is 1.    [facility] PT Assessment - 04/25/19 1004      Ambulation/Gait   Ambulation/Gait Assistance Details  Patient ambulated on outdoor surfaces including grass, hills, pinestraw, negotiated curbs.  She did not have a loss of balance,       Gait Comments  Indoors, emphasized jogging in place and slow jogging in clinic in order to begin to return to faster pace ambulation, more dynamic balance/gait tasks.                   [facility] Adult PT Treatment/Exercise - 04/25/19 1004      Ambulation/Gait   Ambulation/Gait  Yes    Ambulation/Gait Assistance  6: Modified independent (Device/Increase time)    Assistive device  None    Ambulation Surface  Level;Indoor;Outdoor;Unlevel      Neuro Re-ed    Neuro Re-ed Details   Tall knee to 1/2 kneeling with R foot supported on balance disc adding  shoulder ab/adduction in horizontal plane.  Diagonals reaching R shoe to overhead to L shoulder x 5 reps, repeated other direction.  Added  360 degree turn in between.   Trampoline standing activities bouncing , bouncing with eyes closed, standing with narrow stance and narrow stance with eyes closed.  Quadriped with trunk rotation and modified side plank for core activation.  Marching on toes, jogging in place with CGA.  High level dynamic gait and balance activities marching wide to narrow and doing quick jumps.      Exercises   Exercises  Other Exercises    Other Exercises   Extended plank to down dog.         SELF CARE/HOME MANAGEMENT: Discussed habituation and provided/demonstrated and tried long sitting<>supine x 5 reps.       PT Short Term Goals - 04/25/19 0956      PT SHORT  TERM GOAL #1   Title  The patient will be indep with HEP for progression of balance, dynamic gait activities.  (STG due date is 04/22/19)    Time  4    Period  Weeks    Status  Achieved    Target Date  04/22/19      PT SHORT TERM GOAL #2   Title  The patient will improve gait speed from 2.72 ft/sec to > or equal to 3.2 ft/sec to demo improving functional mobility.    Baseline  3.15 ft/sec    Time  4    Period  Weeks    Status  Partially Met    Target Date  04/22/19      PT SHORT TERM GOAL #3   Title  The patient will maintain single leg stance x 10 seconds to demo improved control of narrow base of support standing.     Baseline  *Able to do 10 seconds after multiple attempts.    Time  4    Period  Weeks    Status  Achieved    Target Date  04/22/19      PT SHORT TERM GOAL #4   Title  The patient will improve FGA from 18/30 to > or equal to 21/30 to demonstrate improved dynamic gait.    Baseline  19/30 on 04/17/19    Time  4    Period  Weeks    Status  Partially Met    Target Date  04/22/19        PT Long Term Goals - 03/24/19 1140      PT LONG TERM GOAL #1   Title  The patient will verbalize understanding of post d/c HEP and wellness options for ongoing physical activity.  (LTG due date 05/22/19)    Time  8    Period  Weeks    Target Date  05/22/19      PT LONG TERM GOAL #2   Title  The patient will improve Berg balance score to > or equal to 55/56 to demo improved balance control.    Time  8    Period  Weeks    Target Date  05/22/19      PT LONG TERM GOAL #3   Title  The patient will improve FGA to > or equal to24/30 to demo dec'ing risk for falls.    Time  8    Period  Weeks    Target Date  05/22/19      PT LONG TERM GOAL #4   Title  patient to demonstrate gait over  various surfaces and inclines mod indep x 2000 ft nonstop.    Time  8    Period  Weeks    Target Date  05/22/19      PT LONG TERM GOAL #5   Title  The patient will improve gait speed to > or  equal to 3.5 ft/sec to demo improved functional mobility.    Time  8    Period  Weeks    Target Date  05/22/19      Additional Long Term Goals   Additional Long Term Goals  Yes      PT LONG TERM GOAL #6   Title  The patient will be able to ambulate indoors/outdoors while carrying her 36 year old to return to independence in her life roles.    Time  8    Period  Weeks    Target Date  05/22/19            Plan - 04/25/19 1520    Clinical Impression Statement  The patient has partially met STGs.  PT continuing to work towards [place] emphasizing higher level tasks, coordination, ability to return to higher functioning activities (walking while carrying 36 year old or chasing after 36 year old in the yard).  Plan to continue PT 1x/week.      PT Treatment/Interventions  ADLs/Self Care Home Management;Cryotherapy;Electrical Stimulation;Functional mobility training;Stair training;Gait training;DME Instruction;Moist Heat;Therapeutic activities;Therapeutic exercise;Balance training;Neuromuscular re-education;Patient/family education;Orthotic Fit/Training;Passive range of motion;Manual techniques;Taping;Vestibular    PT Next Visit Plan  Continue to LTGs, compliant surface training, R ankle control, R stance activities, R hip initiation during mid stance phase, gait speed, community gait, dynamic gait.    Consulted and Agree with Plan of Care  Patient       Patient will benefit from skilled therapeutic intervention in order to improve the following deficits and impairments:  Abnormal gait, Decreased balance, Decreased mobility, Decreased strength, Decreased coordination, Difficulty walking, Decreased safety awareness  Visit Diagnosis: Muscle weakness (generalized)  Unsteadiness on feet  Other abnormalities of gait and mobility     Problem List Patient Active Problem List   Diagnosis Date Noted  . Vertebral artery dissection ([place])   . Elevated blood pressure reading   . Major depressive  disorder   . Panic disorder   . CVA (cerebral vascular accident) ([place]) 01/02/2019  . Acute ischemic stroke ([place]) 01/02/2019  . S/P cesarean section 05/05/2016  . S/P tubal ligation 05/05/2016  . Term pregnancy, repeat 05/04/2016  . Unspecified vitamin D deficiency 12/18/2012  . ADHD (attention deficit hyperactivity disorder) 11/26/2011  . Dysthymia 11/26/2011    Timonthy Hovater, PT 04/25/2019, 3:22 PM  [facility] [address] [place], [place], [address] Phone: [phone]   Fax:  [phone]  Name: [name] MRN: [id number] Date of Birth: 1981-07-12

## 2019-05-02 ENCOUNTER — Encounter: Payer: Self-pay | Admitting: Rehabilitative and Restorative Service Providers"

## 2019-05-02 ENCOUNTER — Other Ambulatory Visit: Payer: Self-pay

## 2019-05-02 ENCOUNTER — Ambulatory Visit: Payer: 59 | Admitting: Occupational Therapy

## 2019-05-02 ENCOUNTER — Ambulatory Visit: Payer: 59 | Admitting: Rehabilitative and Restorative Service Providers"

## 2019-05-02 ENCOUNTER — Encounter: Payer: Self-pay | Admitting: Occupational Therapy

## 2019-05-02 DIAGNOSIS — R41842 Visuospatial deficit: Secondary | ICD-10-CM

## 2019-05-02 DIAGNOSIS — R2681 Unsteadiness on feet: Secondary | ICD-10-CM

## 2019-05-02 DIAGNOSIS — R278 Other lack of coordination: Secondary | ICD-10-CM

## 2019-05-02 DIAGNOSIS — M6281 Muscle weakness (generalized): Secondary | ICD-10-CM

## 2019-05-02 DIAGNOSIS — R2689 Other abnormalities of gait and mobility: Secondary | ICD-10-CM

## 2019-05-02 NOTE — Therapy (Signed)
[facility] [address] [place], [place], [address] Phone: [phone]   Fax:  [phone]  Physical Therapy Treatment  Patient Details  Name: [name] MRN: [id number] Date of Birth: 12/17/1980 Referring Provider (PT): Dr. Andrew Kirsteins   Encounter Date: 05/02/2019  PT End of Session - 05/02/19 0907    Visit Number  15    Number of Visits  17    Date for PT Re-Evaluation  05/22/19    Authorization Type  UHC  (23 visit limit)    Authorization - Visit Number  14    Authorization - Number of Visits  23    PT Start Time  0905    PT Stop Time  0946    PT Time Calculation (min)  41 min    Activity Tolerance  Patient tolerated treatment well    Behavior During Therapy  WFL for tasks assessed/performed       Past Medical History:  Diagnosis Date  . ADD (attention deficit disorder)   . Exercise-induced asthma   . Hx of varicella   . Kidney stones   . S/P cesarean section 05/05/2016  . S/P tubal ligation 05/05/2016  . Stroke (HCC)   . Term pregnancy, repeat 05/04/2016    Past Surgical History:  Procedure Laterality Date  . BACK SURGERY     scoliosis age 15  . BILATERAL SALPINGECTOMY Bilateral 05/05/2016   Procedure: BILATERAL SALPINGECTOMY;  Surgeon: Jody Bovard-Stuckert, MD;  [facility]: [facility];  Service: Obstetrics;  Laterality: Bilateral;  . CESAREAN SECTION    . CESAREAN SECTION N/A 05/05/2016   Procedure: CESAREAN SECTION;  Surgeon: Jody Bovard-Stuckert, MD;  Location: [facility];  Service: Obstetrics;  Laterality: N/A;  . CHOLECYSTECTOMY    . HAND SURGERY    . HERNIA REPAIR    . KIDNEY STONE SURGERY     R--surgery; L lithotripsy.  Dr. Tannenbaum    There were no vitals filed for this visit.  Subjective Assessment - 05/02/19 0905    Subjective  The patient reports that she went to a graduation party last night.  Her 36 year old (Harris) wanted her to carry him and got upset when  she couldn't carry him up the driveway.  Patient is leaving for the beach Saturday.    Pertinent History  CVA, ADHD/ADD, panic attacks, exercise induced asthma    Patient Stated Goals  "Using my Right side" - feels as if it is weaker and discoordinated    Currently in Pain?  No/denies       CLINIC OPERATION CHANGES: Outpatient Neuro Rehab is open at lower capacity following universal masking, social distancing, and patient screening.  The patient's COVID risk of complications score is 1.                 [facility] Adult PT Treatment/Exercise - 05/02/19 0917      Ambulation/Gait   Ambulation/Gait  Yes    Ambulation/Gait Assistance  6: Modified independent (Device/Increase time);5: Supervision    Ambulation/Gait Assistance Details  Patient and PT walked outdoors and worked on jogging on paved surfaces.  Jogging indoors after elliptical to translate motion to task.    Ambulation Distance (Feet)  500 Feet    Assistive device  None    Ambulation Surface  Level;Indoor;Outdoor    Gait velocity  3.61 ft/sec    Stairs  Yes    Stairs Assistance  6: Modified independent (Device/Increase time)    Stair Management Technique    One rail Right;Alternating pattern;Step to pattern    Number of Stairs  8    Gait Comments  Toe walking and toe marching.      Neuro Re-ed    Neuro Re-ed Details   Runner's marching exaggerating UEs moving with LEs (alternating) and slow movement for balance.  Standing jumping x 5 reps x 2 sets.  Marching on toes.  Step ups working on foot placement on the steps for R foot coordination.      Exercises   Exercises  Other Exercises    Other Exercises   elliptical x 2 minutes.               PT Short Term Goals - 04/25/19 0956      PT SHORT TERM GOAL #1   Title  The patient will be indep with HEP for progression of balance, dynamic gait activities.  (STG due date is 04/22/19)    Time  4    Period  Weeks    Status  Achieved    Target Date  04/22/19       PT SHORT TERM GOAL #2   Title  The patient will improve gait speed from 2.72 ft/sec to > or equal to 3.2 ft/sec to demo improving functional mobility.    Baseline  3.15 ft/sec    Time  4    Period  Weeks    Status  Partially Met    Target Date  04/22/19      PT SHORT TERM GOAL #3   Title  The patient will maintain single leg stance x 10 seconds to demo improved control of narrow base of support standing.     Baseline  *Able to do 10 seconds after multiple attempts.    Time  4    Period  Weeks    Status  Achieved    Target Date  04/22/19      PT SHORT TERM GOAL #4   Title  The patient will improve FGA from 18/30 to > or equal to 21/30 to demonstrate improved dynamic gait.    Baseline  19/30 on 04/17/19    Time  4    Period  Weeks    Status  Partially Met    Target Date  04/22/19        PT Long Term Goals - 05/02/19 0932      PT LONG TERM GOAL #1   Title  The patient will verbalize understanding of post d/c HEP and wellness options for ongoing physical activity.  (LTG due date 05/22/19)    Time  8    Period  Weeks      PT LONG TERM GOAL #2   Title  The patient will improve Berg balance score to > or equal to 55/56 to demo improved balance control.    Time  8    Period  Weeks      PT LONG TERM GOAL #3   Title  The patient will improve FGA to > or equal to24/30 to demo dec'ing risk for falls.    Time  8    Period  Weeks      PT LONG TERM GOAL #4   Title  patient to demonstrate gait over various surfaces and inclines mod indep x 2000 ft nonstop.    Time  8    Period  Weeks      PT LONG TERM GOAL #5   Title  The patient will improve gait speed to >  or equal to 3.5 ft/sec to demo improved functional mobility.    Baseline  3.65 ft/sec    Time  8    Period  Weeks    Status  Achieved      PT LONG TERM GOAL #6   Title  The patient will be able to ambulate indoors/outdoors while carrying her 36 year old to return to independence in her life roles.    Time  8    Period   Weeks            Plan - 05/02/19 0956    Clinical Impression Statement  The patinet met LTG for gait speed.  PT continuing to progress and patient's gait is normalizing, however some subtle coordination/motor timing limitations noted.  Continue working to LTGs.     PT Treatment/Interventions  ADLs/Self Care Home Management;Cryotherapy;Electrical Stimulation;Functional mobility training;Stair training;Gait training;DME Instruction;Moist Heat;Therapeutic activities;Therapeutic exercise;Balance training;Neuromuscular re-education;Patient/family education;Orthotic Fit/Training;Passive range of motion;Manual techniques;Taping;Vestibular    PT Next Visit Plan  Continue to LTGs, compliant surface training, R ankle control, R stance activities, R hip initiation during mid stance phase, gait speed, community gait, dynamic gait.    Consulted and Agree with Plan of Care  Patient       Patient will benefit from skilled therapeutic intervention in order to improve the following deficits and impairments:  Abnormal gait, Decreased balance, Decreased mobility, Decreased strength, Decreased coordination, Difficulty walking, Decreased safety awareness  Visit Diagnosis: Muscle weakness (generalized)  Unsteadiness on feet  Other abnormalities of gait and mobility     Problem List Patient Active Problem List   Diagnosis Date Noted  . Vertebral artery dissection (HCC)   . Elevated blood pressure reading   . Major depressive disorder   . Panic disorder   . CVA (cerebral vascular accident) (HCC) 01/02/2019  . Acute ischemic stroke (HCC) 01/02/2019  . S/P cesarean section 05/05/2016  . S/P tubal ligation 05/05/2016  . Term pregnancy, repeat 05/04/2016  . Unspecified vitamin D deficiency 12/18/2012  . ADHD (attention deficit hyperactivity disorder) 11/26/2011  . Dysthymia 11/26/2011    ,, PT 05/02/2019, 9:58 AM  [facility] [address] , [place], [address] Phone: [phone]   Fax:  [phone]  Name: [name] MRN: [id number] Date of Birth: 03/20/1981   

## 2019-05-02 NOTE — Therapy (Signed)
[facility] [address] [place], [place], [address] Phone: [phone]   Fax:  [phone]  Occupational Therapy Treatment  Patient Details  Name: MELONI HINZ MRN: [id number] Date of Birth: [birth year] Referring Provider (OT): Dr. Letta Pate   Encounter Date: 05/02/2019  OT End of Session - 05/02/19 0953    Visit Number  15    Number of Visits  23    Date for OT Re-Evaluation  06/15/19    Authorization Type  UHC (23 visit limit for OT)    Authorization - Visit Number  15    Authorization - Number of Visits  23    OT Start Time  [phone]    OT Stop Time  1043    OT Time Calculation (min)  49 min    Activity Tolerance  Patient tolerated treatment well    Behavior During Therapy  [facility] for tasks assessed/performed       Past Medical History:  Diagnosis Date  . ADD (attention deficit disorder)   . Exercise-induced asthma   . Hx of varicella   . Kidney stones   . S/P cesarean section 05/05/2016  . S/P tubal ligation 05/05/2016  . Stroke ([place])   . Term pregnancy, repeat 05/04/2016    Past Surgical History:  Procedure Laterality Date  . BACK SURGERY     scoliosis age 38  . BILATERAL SALPINGECTOMY Bilateral 05/05/2016   Procedure: BILATERAL SALPINGECTOMY;  Surgeon: Janyth Contes, MD;  Location: [facility];  Service: Obstetrics;  Laterality: Bilateral;  . CESAREAN SECTION    . CESAREAN SECTION N/A 05/05/2016   Procedure: CESAREAN SECTION;  Surgeon: Janyth Contes, MD;  Location: [facility];  Service: Obstetrics;  Laterality: N/A;  . CHOLECYSTECTOMY    . HAND SURGERY    . HERNIA REPAIR    . KIDNEY STONE SURGERY     R--surgery; L lithotripsy.  Dr. Gaynelle Arabian    There were no vitals filed for this visit.  Subjective Assessment - 05/02/19 0953    Subjective   Pt reports that she wrote a card.  Pt reports that she is vacuuming longer with RUE and pouring with RUE, hung up clothes, used  gearshift/ey with RUE to back up car.      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 PMH: ADHD, panic attacks, ex induced asthma    Limitations  **fall risk, no driving, diplopia    Currently in Pain?  No/denies         CLINIC OPERATION CHANGES: Outpatient Neuro Rehab is open at lower capacity following universal masking, social distancing, and patient screening.  The patient's COVID risk of complications score is 1.    In standing, performing functional reaching with RUE for incr coordination, balance, R shoulder stability with min cueing for weight shift to the R, decr shoulder hike.Marland Kitchen    Pt instructed to continue Visual tracking inferiorly and divergence with focus on increasing range where pt can merge images (back and down) and that due to progress reviewed recommendation tape letter on wall and move away from letter slowly keeping image one as length of arm distance is improved (or rock away slowly with wt. Shift). Pt returned demo after review.   In quadruped, shoulder ext and abduction with 2lb wt. In R hand for scapular retraction with min cues.   In tall kneeling, diagonals with ball with trunk rotations with focus on scapular movement with  min cueing.    Ambulating while tossing/catching scarf between hands with min-mod difficulty with coordination RUE.  Ambulating while performing environmental scanning to locate 14/15 items initially, then remaining item on 2nd pass with good balance.  Cued to turn head to locate items to decr diplopia.          OT Short Term Goals - 05/02/19 0956      OT SHORT TERM GOAL #1   Title  Independent w/ coordination HEP - 02/15/19    Time  4    Period  Weeks    Status  Achieved      OT SHORT TERM GOAL #2   Title  Independent with diplopia HEP     Time  4    Period  Weeks    Status  Achieved      OT SHORT TERM GOAL #3   Title  Improve fine motor coordination Rt hand as  evidenced by performing 9 hole peg test in 42 sec or under--check updated STGs 05/04/19    Baseline  49.41 sec    Time  6    Period  Weeks    Status  On-going   03/23/19 1 min 17 secs     OT SHORT TERM GOAL #4   Title  Pt to make snack from standing level w/ supervision    Time  4    Period  Weeks    Status  Achieved      OT SHORT TERM GOAL #5   Title  Pt to perform light household tasks (laundry, Manufacturing engineer) w/ counter support x 10 min. w/o rest or LOB    Time  4    Period  Weeks    Status  Achieved      OT SHORT TERM GOAL #6   Title  Pt to read 1 line of enlarged text w/ improvements in diplopia per pt report    Period  Weeks    Status  Achieved      OT SHORT TERM GOAL #7   Title  Pt will report using RUE to assist with ADLs/Functional activity at least 75% of the time.    Baseline  currently using RUE 50% of the time    Time  6    Period  Weeks    Status  On-going   04/25/19:  50% per pt report.  05/02/19:  70%      OT SHORT TERM GOAL #8   Title  Pt will report diplopia with functional activities and environmental scanning 25% or less of the time.    Baseline  currently diplopia 40% of the time with functional activity    Time  6    Period  Weeks    Status  On-going      OT SHORT TERM GOAL  #9   TITLE  Pt will demonstrate ability to write a short paragraph in a reasonable amount of time with 100% legibility    Baseline  Writes a sentence with 100% legibility and increased time    Time  6    Period  Weeks    Status  On-going        OT Long Term Goals - 04/17/19 1355      OT LONG TERM GOAL #1   Title  Independent with updated HEP prn - 03/18/19    Time  8    Period  Weeks    Status  Achieved      OT LONG TERM  GOAL #2   Title  Pt to return to light cooking tasks for 20 min. w/ distant supervision using RUE at least 90% of the time--check updated LTGs 06/15/19    Time  12    Period  Weeks    Status  On-going   03/23/19 , uses RUE 50% of time      OT LONG TERM GOAL #3   Title  Pt to return to moderately complex household tasks w/ distant supervision using RUE at least 90% of the time.    Time  12    Period  Weeks    Status  Revised   03/23/19 uses RUE 75% of the time     OT LONG TERM GOAL #4   Title  Pt to improve grip strength Rt dominant hand by 10 lbs     Baseline  65 lbs    Time  12    Period  Weeks    Status  On-going   68, 76, 64- improved but not consistent     OT LONG TERM GOAL #5   Title  Pt to improve fine motor coordination Rt hand as evidenced by reducing speed on 9 hole peg test to 35 sec. or less    Baseline  49.41 sec    Time  12    Period  Weeks    Status  On-going   1 min 17 secs     OT LONG TERM GOAL #6   Title  Pt able to read paragraph of text w/ minimal diplopia    Time  8    Period  Weeks    Status  Achieved   Pt is able to position her head so that she does not have diplopia     OT LONG TERM GOAL #7   Title  Pt demo improved coordination to be able to pour liquid with RUE without spills.    Status  Achieved      OT LONG TERM GOAL #8   Title  Pt will demonstrate ability to ambulate while carrying a beverage in RUE without spilling.    Baseline  unable to carry a beverage in RUE without spills.    Time  12    Period  Weeks    Status  New      OT LONG TERM GOAL  #9   TITLE  Pt will demonstrate adequate RUE contol to retrieve a 5 lbs weight from mid level shelf with good control.    Time  12    Period  Weeks    Status  New            Plan - 05/02/19 0954    Clinical Impression Statement  Pt continues to progress and reports incr RUE functional use this week.  Pt sees ophthalmologist Friday and is hoping to get prisms.    Occupational Profile and client history currently impacting functional performance  Pt is wife and mother of 4 children. Current deficits affect ability to perform ADLS, activities, life roles. PMH: ADHD, panic attacks, exercise induced asthma    Occupational  performance deficits (Please refer to evaluation for details):  ADL's;IADL's;Leisure;Other    Body Structure / Function / Physical Skills  UE functional use;ROM;FMC;Mobility;Vision;Dexterity;Strength;Endurance;Coordination;IADL;Decreased knowledge of precautions;Decreased knowledge of use of DME    Rehab Potential  Good    OT Frequency  --   14 visits over 12 weeks, currently seeing 1x week   OT Duration  12 weeks  OT Treatment/Interventions  Self-care/ADL training;Therapeutic exercise;Visual/perceptual remediation/compensation;Coping strategies training;Neuromuscular education;Patient/family education;Therapeutic activities;Functional Mobility Training;DME and/or AE instruction;Passive range of motion;Aquatic Therapy;Moist Heat;Fluidtherapy;Balance training;Gait Training;Cryotherapy;Energy conservation;Manual Therapy    Plan  address/re-check remaining STGs.  continue with RUE strength/control, visual-perceptual skills and avoiding postural compensation, ?schedule additional OT visits in July (1x every other week?)    OT Home Exercise Plan  Education provided:  pt has coordination HEP, putty,  and diplopia HEP.     Medbridge Access Code:  [email or web address]     Consulted and Agree with Plan of Care  Patient       Patient will benefit from skilled therapeutic intervention in order to improve the following deficits and impairments:  Body Structure / Function / Physical Skills  Visit Diagnosis: Muscle weakness (generalized)  Unsteadiness on feet  Other abnormalities of gait and mobility  Visuospatial deficit  Other lack of coordination    Problem List Patient Active Problem List   Diagnosis Date Noted  . Vertebral artery dissection ([place])   . Elevated blood pressure reading   . Major depressive disorder   . Panic disorder   . CVA (cerebral vascular accident) ([place]) 01/02/2019  . Acute ischemic stroke ([place]) 01/02/2019  . S/P cesarean section 05/05/2016  . S/P tubal ligation 05/05/2016  .  Term pregnancy, repeat 05/04/2016  . Unspecified vitamin D deficiency 12/18/2012  . ADHD (attention deficit hyperactivity disorder) 11/26/2011  . Dysthymia 11/26/2011    [facility] 05/02/2019, 12:35 PM  [facility] [address] [place], [place], [address] Phone: [phone]   Fax:  [phone]  Name: MAGDALEN CABANA MRN: [id number] Date of Birth: 1980/12/19   Vianne Bulls, OTR/L [facility] [address] [place], [place]  [address] [phone] phone [phone] 05/02/19 12:35 PM

## 2019-05-08 ENCOUNTER — Telehealth: Payer: Self-pay | Admitting: Family Medicine

## 2019-05-08 NOTE — Telephone Encounter (Signed)
Pt called and states she forgot her bp medication at home but it was almost empty anyway. Refill called into [facility] at [phone].

## 2019-05-15 ENCOUNTER — Other Ambulatory Visit: Payer: Self-pay

## 2019-05-15 ENCOUNTER — Ambulatory Visit: Payer: 59 | Admitting: Rehabilitative and Restorative Service Providers"

## 2019-05-15 ENCOUNTER — Ambulatory Visit: Payer: 59 | Admitting: Occupational Therapy

## 2019-05-15 ENCOUNTER — Encounter: Payer: Self-pay | Admitting: Rehabilitative and Restorative Service Providers"

## 2019-05-15 DIAGNOSIS — M6281 Muscle weakness (generalized): Secondary | ICD-10-CM | POA: Diagnosis not present

## 2019-05-15 DIAGNOSIS — R2681 Unsteadiness on feet: Secondary | ICD-10-CM

## 2019-05-15 DIAGNOSIS — R2689 Other abnormalities of gait and mobility: Secondary | ICD-10-CM

## 2019-05-15 DIAGNOSIS — R278 Other lack of coordination: Secondary | ICD-10-CM

## 2019-05-15 DIAGNOSIS — R41842 Visuospatial deficit: Secondary | ICD-10-CM

## 2019-05-15 NOTE — Therapy (Signed)
[facility] [address] [place], [place], [address] Phone: [phone]   Fax:  [phone]  Physical Therapy Treatment and Discharge Summary  Patient Details  Name: [name] MRN: [id number] Date of Birth: 03/06/1981 Referring Provider (PT): Dr. [name] Penna  CLINIC OPERATION CHANGES: Outpatient [facility] Rehab is open at lower capacity following universal masking, social distancing, and patient screening.  The patient's COVID risk of complications score is 1.   Encounter Date: 05/15/2019  PT End of Session - 05/15/19 1002    Visit Number  16    Number of Visits  17    Date for PT Re-Evaluation  05/22/19    Authorization Type  UHC  (23 visit limit)    Authorization - Visit Number  16    Authorization - Number of Visits  23    PT Start Time  1001    PT Stop Time  1045    PT Time Calculation (min)  44 min    Activity Tolerance  Patient tolerated treatment well    Behavior During Therapy  WFL for tasks assessed/performed       Past Medical History:  Diagnosis Date  . ADD (attention deficit disorder)   . Exercise-induced asthma   . Hx of varicella   . Kidney stones   . S/P cesarean section 05/05/2016  . S/P tubal ligation 05/05/2016  . Stroke ([facility])   . Term pregnancy, repeat 05/04/2016    Past Surgical History:  Procedure Laterality Date  . BACK SURGERY     scoliosis age 33  . BILATERAL SALPINGECTOMY Bilateral 05/05/2016   Procedure: BILATERAL SALPINGECTOMY;  Surgeon: Janyth Contes, MD;  Location: [facility];  Service: Obstetrics;  Laterality: Bilateral;  . CESAREAN SECTION    . CESAREAN SECTION N/A 05/05/2016   Procedure: CESAREAN SECTION;  Surgeon: Janyth Contes, MD;  Location: [facility];  Service: Obstetrics;  Laterality: N/A;  . CHOLECYSTECTOMY    . HAND SURGERY    . HERNIA REPAIR    . KIDNEY STONE SURGERY     R--surgery; L lithotripsy.  Dr. Gaynelle Arabian    There  were no vitals filed for this visit.  Subjective Assessment - 05/15/19 1149    Subjective  The patient was able to walk on the beach-- she had to adjust her stance to be wider to compensate.    Pertinent History  CVA, ADHD/ADD, panic attacks, exercise induced asthma    Patient Stated Goals  "Using my Right side" - feels as if it is weaker and discoordinated    Currently in Pain?  No/denies         [facility] PT Assessment - 05/15/19 1011      Standardized Balance Assessment   Standardized Balance Assessment  Berg Balance Test      Berg Balance Test   Sit to Stand  Able to stand without using hands and stabilize independently    Standing Unsupported  Able to stand safely 2 minutes    Sitting with Back Unsupported but Feet Supported on Floor or Stool  Able to sit safely and securely 2 minutes    Stand to Sit  Sits safely with minimal use of hands    Transfers  Able to transfer safely, minor use of hands    Standing Unsupported with Eyes Closed  Able to stand 10 seconds safely    Standing Unsupported with Feet Together  Able to place feet together independently and stand 1 minute safely  From Standing, Reach Forward with Outstretched Arm  Can reach confidently >25 cm (10")    From Standing Position, Pick up Object from [place] to pick up shoe safely and easily    From Standing Position, Turn to Look Behind Over each Shoulder  Looks behind from both sides and weight shifts well    Turn 360 Degrees  Able to turn 360 degrees safely in 4 seconds or less    Standing Unsupported, Alternately Place Feet on Step/Stool  Able to stand independently and safely and complete 8 steps in 20 seconds    Standing Unsupported, One Foot in [place] to place foot tandem independently and hold 30 seconds    Standing on One Leg  Able to lift leg independently and hold 5-10 seconds    Total Score  55    Berg comment:  55/56      Functional Gait  Assessment   Gait assessed   Yes    Gait Level Surface   Walks 20 ft in less than 7 sec but greater than 5.5 sec, uses assistive device, slower speed, mild gait deviations, or deviates 6-10 in outside of the 12 in walkway width.    Change in Gait Speed  Able to smoothly change walking speed without loss of balance or gait deviation. Deviate no more than 6 in outside of the 12 in walkway width.    Gait with Horizontal Head Turns  Performs head turns smoothly with slight change in gait velocity (eg, minor disruption to smooth gait path), deviates 6-10 in outside 12 in walkway width, or uses an assistive device.    Gait with Vertical Head Turns  Performs task with slight change in gait velocity (eg, minor disruption to smooth gait path), deviates 6 - 10 in outside 12 in walkway width or uses assistive device    Gait and Pivot Turn  Pivot turns safely within 3 sec and stops quickly with no loss of balance.    Step Over Obstacle  Is able to step over 2 stacked shoe boxes taped together (9 in total height) without changing gait speed. No evidence of imbalance.    Gait with Narrow Base of Support  Ambulates 4-7 steps.    Gait with Eyes Closed  Walks 20 ft, no assistive devices, good speed, no evidence of imbalance, normal gait pattern, deviates no more than 6 in outside 12 in walkway width. Ambulates 20 ft in less than 7 sec.    Ambulating Backwards  Walks 20 ft, uses assistive device, slower speed, mild gait deviations, deviates 6-10 in outside 12 in walkway width.    Steps  Alternating feet, must use rail.    Total Score  23    FGA comment:  23/30                   [facility] Adult PT Treatment/Exercise - 05/15/19 1011      Ambulation/Gait   Ambulation/Gait  Yes    Ambulation/Gait Assistance  6: Modified independent (Device/Increase time)    Ambulation/Gait Assistance Details  Patient walked on outdoor surfaces carrying 22 pounds on unlevel ground.    Ambulation Distance (Feet)  200 Feet    Assistive device  None    Ambulation Surface   Level;Indoor;Outdoor       NEUROMUSCULAR RE-EDUCATION: HEP included single leg stance, tandem walking, quadriped and compliant surface with eyes closed.       PT Education - 05/15/19 1030    Education Details  Updated HEP    Person(s) Educated  Patient    Methods  Explanation;Demonstration;Handout    Comprehension  Verbalized understanding       PT Short Term Goals - 04/25/19 0956      PT SHORT TERM GOAL #1   Title  The patient will be indep with HEP for progression of balance, dynamic gait activities.  (STG due date is 04/22/19)    Time  4    Period  Weeks    Status  Achieved    Target Date  04/22/19      PT SHORT TERM GOAL #2   Title  The patient will improve gait speed from 2.72 ft/sec to > or equal to 3.2 ft/sec to demo improving functional mobility.    Baseline  3.15 ft/sec    Time  4    Period  Weeks    Status  Partially Met    Target Date  04/22/19      PT SHORT TERM GOAL #3   Title  The patient will maintain single leg stance x 10 seconds to demo improved control of narrow base of support standing.     Baseline  *Able to do 10 seconds after multiple attempts.    Time  4    Period  Weeks    Status  Achieved    Target Date  04/22/19      PT SHORT TERM GOAL #4   Title  The patient will improve FGA from 18/30 to > or equal to 21/30 to demonstrate improved dynamic gait.    Baseline  19/30 on 04/17/19    Time  4    Period  Weeks    Status  Partially Met    Target Date  04/22/19        PT Long Term Goals - 05/15/19 1003      PT LONG TERM GOAL #1   Title  The patient will verbalize understanding of post d/c HEP and wellness options for ongoing physical activity.  (LTG due date 05/22/19)    Time  8    Period  Weeks    Status  Achieved      PT LONG TERM GOAL #2   Title  The patient will improve Berg balance score to > or equal to 55/56 to demo improved balance control.    Baseline  Has improved from 43/56 up to 55/56 since beginning therapy.    Time  8     Period  Weeks    Status  Achieved      PT LONG TERM GOAL #3   Title  The patient will improve FGA to > or equal to24/30 to demo dec'ing risk for falls.    Baseline  The patient has improved from 13/30 up to 23/30 since beginning PT.    Time  8    Period  Weeks    Status  Partially Met      PT LONG TERM GOAL #4   Title  patient to demonstrate gait over various surfaces and inclines mod indep x 2000 ft nonstop.    Baseline  Patient was able to walk on beach without assistance.    Time  8    Period  Weeks    Status  Achieved      PT LONG TERM GOAL #5   Title  The patient will improve gait speed to > or equal to 3.5 ft/sec to demo improved functional mobility.    Baseline  3.65 ft/sec  Time  8    Period  Weeks    Status  Achieved      PT LONG TERM GOAL #6   Title  The patient will be able to ambulate indoors/outdoors while carrying her 36 year old to return to independence in her life roles.    Baseline  Patient is able to walk through the grass while carrying her son.    Time  8    Period  Weeks    Status  Achieved            Plan - 05/15/19 1152    Clinical Impression Statement  The patient has made extensive gains in physical therapy.  She has improved Berg from 43/56 up to 55/56, improved FGA from 13/30 up to 23/30 and has improved gait speed to near [facility].  She continues with mild ataxia/dyscoordination noted on the right and double vision due to skew eye deviation.  Patient HEP update-- she is continuing to challenge herself by reintegrating back into family activities and this is helping her continue to progress.    PT Treatment/Interventions  ADLs/Self Care Home Management;Cryotherapy;Electrical Stimulation;Functional mobility training;Stair training;Gait training;DME Instruction;Moist Heat;Therapeutic activities;Therapeutic exercise;Balance training;Neuromuscular re-education;Patient/family education;Orthotic Fit/Training;Passive range of motion;Manual  techniques;Taping;Vestibular    PT Next Visit Plan  Discharge today.    Consulted and Agree with Plan of Care  Patient       Patient will benefit from skilled therapeutic intervention in order to improve the following deficits and impairments:  Abnormal gait, Decreased balance, Decreased mobility, Decreased strength, Decreased coordination, Difficulty walking, Decreased safety awareness  Visit Diagnosis: 1. Muscle weakness (generalized)   2. Unsteadiness on feet   3. Other abnormalities of gait and mobility       PHYSICAL THERAPY DISCHARGE SUMMARY  Visits from Start of Care: 16  Current functional level related to goals / functional outcomes: See above   Remaining deficits: dyscoordination R side Ataxia dec'd high level balance dec'd dynamic gait Double vision   Education / Equipment: Home program.  Plan: Patient agrees to discharge.  Patient goals were met. Patient is being discharged due to meeting the stated rehab goals.  ?????         Thank you for the referral of this patient. Rudell Cobb, MPT  [place] 05/15/2019, 2:19 PM  [facility] [address] [place], [place], [address] Phone: [phone]   Fax:  [phone]  Name: [name] MRN: [id number] Date of Birth: [birth year]

## 2019-05-15 NOTE — Therapy (Signed)
[facility] [address] [place], [place], [address] Phone: [phone]   Fax:  [phone]  Occupational Therapy Treatment  Patient Details  Name: SARAJANE FAMBROUGH MRN: [id number] Date of Birth: March 10, 1981 Referring Provider (OT): Dr. Letta Pate   Encounter Date: 05/15/2019  OT End of Session - 05/15/19 0919    Visit Number  16    Number of Visits  23    Date for OT Re-Evaluation  06/15/19    Authorization Type  UHC (23 visit limit for OT)    Authorization - Visit Number  16    Authorization - Number of Visits  23    OT Start Time  [phone]    OT Stop Time  0945    OT Time Calculation (min)  40 min    Activity Tolerance  Patient tolerated treatment well    Behavior During Therapy  [facility] for tasks assessed/performed       Past Medical History:  Diagnosis Date  . ADD (attention deficit disorder)   . Exercise-induced asthma   . Hx of varicella   . Kidney stones   . S/P cesarean section 05/05/2016  . S/P tubal ligation 05/05/2016  . Stroke ([place])   . Term pregnancy, repeat 05/04/2016    Past Surgical History:  Procedure Laterality Date  . BACK SURGERY     scoliosis age 60  . BILATERAL SALPINGECTOMY Bilateral 05/05/2016   Procedure: BILATERAL SALPINGECTOMY;  Surgeon: Janyth Contes, MD;  Location: [facility];  Service: Obstetrics;  Laterality: Bilateral;  . CESAREAN SECTION    . CESAREAN SECTION N/A 05/05/2016   Procedure: CESAREAN SECTION;  Surgeon: Janyth Contes, MD;  Location: [facility];  Service: Obstetrics;  Laterality: N/A;  . CHOLECYSTECTOMY    . HAND SURGERY    . HERNIA REPAIR    . KIDNEY STONE SURGERY     R--surgery; L lithotripsy.  Dr. Gaynelle Arabian    There were no vitals filed for this visit.  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 PMH: ADHD, panic attacks, ex induced asthma    Limitations  **fall risk, no driving, diplopia    Currently in Pain?  No/denies              CLINIC OPERATION CHANGES: Outpatient Neuro Rehab is open at lower capacity following universal masking, social distancing, and patient screening.  The patient's COVID risk of complications score is 1.  Scanning activity to locate disorganized  items on vertical surface and to point out with laser pointer, pt was able to fuse the image by adjusting head position. Pt reports that her eye doctor said she should be ok to drive short distance to grocery with her husband outside of the city. Pt was encouraged to practice in a parking lot first then to drive only with her husband in the car and no children with her.  Cuting paper using her RUE, good accuracy noted.  Checked progress towards short term goals for 9 hole peg test. See goals for updates.  In quadruped, shoulder ext and abduction with 2lb wt. In R hand for scapular retraction with min cues.   In tall kneeling, diagonals with ball with trunk rotations with focus on scapular movement with min cueing.  Rocking forwards and backwards then birddog x 10  reps for core stability.  Wall pushups x 10 reps min facilitation/ v.c  Ambulating while tossing/catching ball between hands with min-mod difficulty with coordination RUE.               OT Short Term Goals - 05/15/19 0948      OT SHORT TERM GOAL #1   Title  Independent w/ coordination HEP - 02/15/19    Time  4    Period  Weeks    Status  Achieved      OT SHORT TERM GOAL #2   Title  Independent with diplopia HEP     Time  4    Period  Weeks    Status  Achieved      OT SHORT TERM GOAL #3   Title  Improve fine motor coordination Rt hand as evidenced by performing 9 hole peg test in 42 sec or under--check updated STGs 05/04/19    Baseline  49.41 sec    Time  6    Period  Weeks    Status  On-going    05/15/19-53.34     OT SHORT TERM GOAL #4   Title  Pt to make snack from standing level w/ supervision    Time  4    Period  Weeks    Status  Achieved      OT SHORT TERM GOAL #5   Title  Pt to perform light household tasks (laundry, Manufacturing engineer) w/ counter support x 10 min. w/o rest or LOB    Time  4    Period  Weeks    Status  Achieved      OT SHORT TERM GOAL #6   Title  Pt to read 1 line of enlarged text w/ improvements in diplopia per pt report    Period  Weeks    Status  Achieved      OT SHORT TERM GOAL #7   Title  Pt will report using RUE to assist with ADLs/Functional activity at least 75% of the time.    Baseline  currently using RUE 50% of the time    Time  6    Period  Weeks    Status  On-going   05/15/19-70%     OT SHORT TERM GOAL #8   Title  Pt will report diplopia with functional activities and environmental scanning 25% or less of the time.    Baseline  currently diplopia 40% of the time with functional activity    Time  6    Period  Weeks    Status  On-going      OT SHORT TERM GOAL  #9   TITLE  Pt will demonstrate ability to write a short paragraph in a reasonable amount of time with 100% legibility    Baseline  Writes a sentence with 100% legibility and increased time    Time  6    Period  Weeks    Status  On-going        OT Long Term Goals - 04/17/19 1355      OT LONG TERM GOAL #1   Title  Independent with updated HEP prn - 03/18/19    Time  8    Period  Weeks    Status  Achieved      OT LONG TERM GOAL #2   Title  Pt to return to light cooking tasks for 20 min. w/ distant supervision using RUE at least 90% of the time--check updated LTGs 06/15/19  Time  12    Period  Weeks    Status  On-going   03/23/19 , uses RUE 50% of time     OT LONG TERM GOAL #3   Title  Pt to return to moderately complex household tasks w/ distant supervision using RUE at least 90% of the time.    Time  12    Period  Weeks    Status  Revised   03/23/19  uses RUE 75% of the time     OT LONG TERM GOAL #4   Title  Pt to improve grip strength Rt dominant hand by 10 lbs     Baseline  65 lbs    Time  12    Period  Weeks    Status  On-going   68, 76, 64- improved but not consistent     OT LONG TERM GOAL #5   Title  Pt to improve fine motor coordination Rt hand as evidenced by reducing speed on 9 hole peg test to 35 sec. or less    Baseline  49.41 sec    Time  12    Period  Weeks    Status  On-going   1 min 17 secs     OT LONG TERM GOAL #6   Title  Pt able to read paragraph of text w/ minimal diplopia    Time  8    Period  Weeks    Status  Achieved   Pt is able to position her head so that she does not have diplopia     OT LONG TERM GOAL #7   Title  Pt demo improved coordination to be able to pour liquid with RUE without spills.    Status  Achieved      OT LONG TERM GOAL #8   Title  Pt will demonstrate ability to ambulate while carrying a beverage in RUE without spilling.    Baseline  unable to carry a beverage in RUE without spills.    Time  12    Period  Weeks    Status  New      OT LONG TERM GOAL  #9   TITLE  Pt will demonstrate adequate RUE contol to retrieve a 5 lbs weight from mid level shelf with good control.    Time  12    Period  Weeks    Status  New            Plan - 05/15/19 1344    Clinical Impression Statement  Pt continues to progress with improving strength and RUE functional use. Pt reports MD did not issue prisms and he may consider surgery if her vision has not improved when she returns.    Occupational Profile and client history currently impacting functional performance  Pt is wife and mother of 4 children. Current deficits affect ability to perform ADLS, activities, life roles. PMH: ADHD, panic attacks, exercise induced asthma    Occupational performance deficits (Please refer to evaluation for details):  ADL's;IADL's;Leisure;Other    Body Structure / Function / Physical Skills  UE functional  use;ROM;FMC;Mobility;Vision;Dexterity;Strength;Endurance;Coordination;IADL;Decreased knowledge of precautions;Decreased knowledge of use of DME    Rehab Potential  Good    OT Frequency  --   14 visits over 12 weeks, currently seeing 1x week   OT Duration  12 weeks    OT Treatment/Interventions  Self-care/ADL training;Therapeutic exercise;Visual/perceptual remediation/compensation;Coping strategies training;Neuromuscular education;Patient/family education;Therapeutic activities;Functional Mobility Training;DME and/or AE instruction;Passive range of motion;Aquatic Therapy;Moist Heat;Fluidtherapy;Balance training;Gait Training;Cryotherapy;Energy  conservation;Manual Therapy    Plan  continue with RUE strength/control, visual-perceptual skills and avoiding postural compensation,    OT Home Exercise Plan  Education provided:  pt has coordination HEP, putty,  and diplopia HEP.     Medbridge Access Code:  [email or web address]     Consulted and Agree with Plan of Care  Patient       Patient will benefit from skilled therapeutic intervention in order to improve the following deficits and impairments:   Body Structure / Function / Physical Skills: UE functional use, ROM, FMC, Mobility, Vision, Dexterity, Strength, Endurance, Coordination, IADL, Decreased knowledge of precautions, Decreased knowledge of use of DME       Visit Diagnosis: 1. Muscle weakness (generalized)   2. Visuospatial deficit   3. Other lack of coordination       Problem List Patient Active Problem List   Diagnosis Date Noted  . Vertebral artery dissection ([facility])   . Elevated blood pressure reading   . Major depressive disorder   . Panic disorder   . CVA (cerebral vascular accident) ([facility]) 01/02/2019  . Acute ischemic stroke ([place] [facility]) 01/02/2019  . S/P cesarean section 05/05/2016  . S/P tubal ligation 05/05/2016  . Term pregnancy, repeat 05/04/2016  . Unspecified vitamin D deficiency 12/18/2012  . ADHD (attention deficit hyperactivity  disorder) 11/26/2011  . Dysthymia 11/26/2011    RINE,KATHRYN 05/15/2019, 1:46 PM  [facility] [address] [place], [place], [address] Phone: [phone]   Fax:  [phone]  Name: KARINDA CABRIALES MRN: [id number] Date of Birth: [birth year]

## 2019-05-15 NOTE — Patient Instructions (Signed)
Access Code: [email or web address]  URL: [email or web address]  Date: 05/15/2019  Prepared by: Rudell Cobb   Program Notes  Begin program having a family member stand nearby for safety. As you are able to perform without loss of balance, you can begin to try on your own.   Exercises Wide Stance with Eyes Closed on Foam Pad - 3 reps - 1 sets - 30 seconds hold - 1x daily - 7x weekly Walking Tandem Stance - 10 reps - 1 sets - 1x daily - 7x weekly Bird Dog - 10 reps - 5-10sec hold - 1x daily Single Leg Stance - 3 reps - 1 sets - 1x daily - 7x weekly

## 2019-05-23 ENCOUNTER — Ambulatory Visit: Payer: 59 | Admitting: Rehabilitative and Restorative Service Providers"

## 2019-05-23 ENCOUNTER — Ambulatory Visit: Payer: 59 | Admitting: Occupational Therapy

## 2019-05-29 ENCOUNTER — Encounter: Payer: 59 | Attending: Registered Nurse | Admitting: Physical Medicine & Rehabilitation

## 2019-05-29 ENCOUNTER — Other Ambulatory Visit: Payer: Self-pay

## 2019-05-29 ENCOUNTER — Encounter: Payer: Self-pay | Admitting: Physical Medicine & Rehabilitation

## 2019-05-29 VITALS — BP 128/85 | HR 83 | Temp 97.5°F | Ht 64.0 in | Wt 171.8 lb

## 2019-05-29 DIAGNOSIS — R269 Unspecified abnormalities of gait and mobility: Secondary | ICD-10-CM

## 2019-05-29 DIAGNOSIS — F410 Panic disorder [episodic paroxysmal anxiety] without agoraphobia: Secondary | ICD-10-CM | POA: Insufficient documentation

## 2019-05-29 DIAGNOSIS — I7774 Dissection of vertebral artery: Secondary | ICD-10-CM | POA: Diagnosis not present

## 2019-05-29 DIAGNOSIS — H9312 Tinnitus, left ear: Secondary | ICD-10-CM | POA: Insufficient documentation

## 2019-05-29 DIAGNOSIS — I10 Essential (primary) hypertension: Secondary | ICD-10-CM | POA: Diagnosis present

## 2019-05-29 DIAGNOSIS — I69393 Ataxia following cerebral infarction: Secondary | ICD-10-CM | POA: Diagnosis not present

## 2019-05-29 DIAGNOSIS — I69398 Other sequelae of cerebral infarction: Secondary | ICD-10-CM

## 2019-05-29 DIAGNOSIS — H539 Unspecified visual disturbance: Secondary | ICD-10-CM

## 2019-05-29 NOTE — Progress Notes (Signed)
Subjective:    Patient ID: [name], female    DOB: January 04, 1981, 38 y.o.   MRN: [id number]  HPI  38 year old female who is admitted to the hospital on 01/02/2027 with complaints of right-sided neck pain and dizziness.  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  She did have evidence of acute right vertebral artery dissection.  She was on the inpatient rehabilitation unit from 01/06/2019 to 01/16/2019.  She discharged ambulating with a rolling walker. Graduated from PT with Merrilee Jansky 53/55  RLE fatigue improving  RUE handwriting improving but not back to baseline 9 hole PEG still slow, 42s  Skew deviation persists oked to drive per Optho- Dr Frederico Hamman  No falls, tripped once  Patient is cooking cleaning as well as taking care of her children. Pain Inventory Average Pain 0 Pain Right Now 0 My pain is no pain  In the last 24 hours, has pain interfered with the following? General activity 0 Relation with others 0 Enjoyment of life 0 What TIME of day is your pain at its worst? no pain Sleep (in general) Good  Pain is worse with: no pain Pain improves with: no pain Relief from Meds: 10  Mobility walk without assistance how many minutes can you walk? 10 ability to climb steps?  yes do you drive?  yes  Function not employed: date last employed na  Neuro/Psych trouble walking  Prior Studies Any changes since last visit?  no  Physicians involved in your care Any changes since last visit?  no   Family History  Problem Relation Age of Onset  . Cancer Mother   . Cancer Maternal Aunt   . Cancer Maternal Grandmother    Social History   Socioeconomic History  . Marital status: Married    Spouse name: Not on file  . Number of children: Not on file  . Years of education: Not on file  . Highest education level: Not on file  Occupational History  . Not on file  Social Needs  . Financial resource strain: Not on file  . Food  insecurity    Worry: Not on file    Inability: Not on file  . Transportation needs    Medical: Not on file    Non-medical: Not on file  Tobacco Use  . Smoking status: Never Smoker  . Smokeless tobacco: Never Used  Substance and Sexual Activity  . Alcohol use: No    Comment: occasional wine  . Drug use: No  . Sexual activity: Yes    Partners: Male    Birth control/protection: Surgical  Lifestyle  . Physical activity    Days per week: Not on file    Minutes per session: Not on file  . Stress: Not on file  Relationships  . Social Herbalist on phone: Not on file    Gets together: Not on file    Attends religious service: Not on file    Active member of club or organization: Not on file    Attends meetings of clubs or organizations: Not on file    Relationship status: Not on file  Other Topics Concern  . Not on file  Social History Narrative  . Not on file   Past Surgical History:  Procedure Laterality Date  . BACK SURGERY     scoliosis age 67  . BILATERAL SALPINGECTOMY Bilateral 05/05/2016   Procedure: BILATERAL SALPINGECTOMY;  Surgeon: Janyth Contes, MD;  Location: [place];  Service: Obstetrics;  Laterality: Bilateral;  . CESAREAN SECTION    . CESAREAN SECTION N/A 05/05/2016   Procedure: CESAREAN SECTION;  Surgeon: Janyth Contes, MD;  Location: [place];  Service: Obstetrics;  Laterality: N/A;  . CHOLECYSTECTOMY    . HAND SURGERY    . HERNIA REPAIR    . KIDNEY STONE SURGERY     R--surgery; L lithotripsy.  Dr. Gaynelle Arabian   Past Medical History:  Diagnosis Date  . ADD (attention deficit disorder)   . Exercise-induced asthma   . Hx of varicella   . Kidney stones   . S/P cesarean section 05/05/2016  . S/P tubal ligation 05/05/2016  . Stroke ([place])   . Term pregnancy, repeat 05/04/2016   BP 128/85   Pulse 83   Temp (!) 97.5 F (36.4 C)   Ht 5\' 4"  (1.626 m)   Wt 171 lb 12.8 oz (77.9 kg)   SpO2 96%   BMI 29.49 kg/m    Opioid Risk Score:   Fall Risk Score:  `1  Depression screen PHQ 2/9  Depression screen [facility] 2/9 05/29/2019 04/10/2019 03/13/2019 02/22/2019 02/05/2019 06/06/2017  Decreased Interest 0 0 0 0 0 0  Down, Depressed, Hopeless 0 0 0 0 0 0  PHQ - 2 Score 0 0 0 0 0 0    Review of Systems  Constitutional: Negative.   HENT: Negative.   Eyes: Negative.   Respiratory: Negative.   Cardiovascular: Negative.   Gastrointestinal: Negative.   Endocrine: Negative.   Genitourinary: Negative.   Musculoskeletal: Positive for gait problem.  Skin: Negative.   Allergic/Immunologic: Negative.   Hematological: Negative.   Psychiatric/Behavioral: Negative.   All other systems reviewed and are negative.      Objective:   Physical Exam Visual fields are intact confrontation testing There is evidence of nystagmus with lateral gaze bilaterally. With forward gaze she does experience some vertical diplopia. Motor strength is 5/5 bilateral deltoid bicep tricep grip hip flexion knee extension ankle dorsiflexion plantarflexion There is mild dysdiadochokinesis with rapid alternating supination pronation right upper extremity. Finger to thumb opposition is slightly slower on the left side.  Romberg is negative Sharpened Romberg is positive. She has some difficulty with tandem gait.      Assessment & Plan:  1.  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  Her PCA infarcts at this point do not have any significant residual deficit. She has received clearance for driving from neuro-ophthalmology.  We discussed portance of keeping up with home exercise program  I will see her back in approximately 3 months time Primary care follow-up in the interim

## 2019-06-05 ENCOUNTER — Ambulatory Visit (INDEPENDENT_AMBULATORY_CARE_PROVIDER_SITE_OTHER): Payer: 59 | Admitting: Family Medicine

## 2019-06-05 ENCOUNTER — Encounter: Payer: Self-pay | Admitting: Family Medicine

## 2019-06-05 ENCOUNTER — Other Ambulatory Visit: Payer: Self-pay

## 2019-06-05 VITALS — BP 116/84 | HR 78 | Temp 98.0°F | Wt 170.2 lb

## 2019-06-05 DIAGNOSIS — I6310 Cerebral infarction due to embolism of unspecified precerebral artery: Secondary | ICD-10-CM | POA: Diagnosis not present

## 2019-06-05 DIAGNOSIS — F902 Attention-deficit hyperactivity disorder, combined type: Secondary | ICD-10-CM

## 2019-06-05 NOTE — Progress Notes (Addendum)
   Subjective:    Patient ID: [name], female    DOB: September 22, 1981, 38 y.o.   MRN: [id number]  HPI She is here for consult concerning starting back on Adderall.  Prior to her vertebral artery dissection and subsequent CVA she was doing very well on her medication.  She was taking an XR and a regular Adderall to get her through the day. She seems to be doing well after her CVA.  She is having difficulty with double vision but is able to drive.  She is seeing ophthalmology for that.  She does have some residual right-sided weakness but is able to do all of her ADLs without difficulty.  She has a very good attitude towards all of this.   Review of Systems     Objective:   Physical Exam Alert and in no distress.  Antalgic gait is noted on the right.       Assessment & Plan:  Attention deficit hyperactivity disorder (ADHD), combined type - Plan: I will check with neurology to see if they have any concerns with starting back on Adderall.  Cerebrovascular accident (CVA) due to embolism of precerebral artery ([place]) -  7/16 the note was reviewed with neurology.  I will place her back on Adderall.

## 2019-06-07 MED ORDER — AMPHETAMINE-DEXTROAMPHET ER 20 MG PO CP24: 20.0000 mg | ORAL_CAPSULE | Freq: Every day | ORAL | 0 refills | Status: DC

## 2019-06-07 MED ORDER — AMPHETAMINE-DEXTROAMPHETAMINE 10 MG PO TABS: 10.0000 mg | ORAL_TABLET | Freq: Every day | ORAL | 0 refills | Status: DC

## 2019-06-07 NOTE — Addendum Note (Signed)
Addended by: Denita Lung on: 06/07/2019 09:24 AM   Modules accepted: Orders

## 2019-06-28 ENCOUNTER — Telehealth: Payer: Self-pay | Admitting: Family Medicine

## 2019-06-28 MED ORDER — AMPHETAMINE-DEXTROAMPHET ER 15 MG PO CP24: 15.0000 mg | ORAL_CAPSULE | ORAL | 0 refills | Status: DC

## 2019-06-28 NOTE — Telephone Encounter (Signed)
Pt wants to know if she can get a lighter dose if possible. She uses [facility]

## 2019-06-28 NOTE — Telephone Encounter (Signed)
Pt called and stated that her Adderall XR 20mg  is a little to strong for her and wants to know if

## 2019-06-28 NOTE — Telephone Encounter (Signed)
She called hoping for a lesser dose of Adderall.  I will call in 15 mg.

## 2019-07-23 ENCOUNTER — Telehealth: Payer: Self-pay | Admitting: Family Medicine

## 2019-07-23 NOTE — Telephone Encounter (Signed)
Call in an okay for her to get the 20 mg dosing

## 2019-07-23 NOTE — Telephone Encounter (Signed)
Pharmacy is requesting that pt's adderall be sent in electronically. Thanks [employer]

## 2019-07-23 NOTE — Telephone Encounter (Signed)
Pt called and said the 15 mg Adderall isnt working and wants to stay on the 20mg  instead but her pharmacy said she would needs Drs. approval.

## 2019-07-23 NOTE — Telephone Encounter (Signed)
If you look its already in there so not sure why they want me to send it again

## 2019-07-24 NOTE — Telephone Encounter (Signed)
The note on the script stated pt would need dr approval before it was to be refilled. That would not have been seen on the script. It had to have been requested at last refill so please advise if she needs an appt or if new script will be e scribed . Wallace

## 2019-07-31 ENCOUNTER — Other Ambulatory Visit: Payer: Self-pay | Admitting: Family Medicine

## 2019-07-31 DIAGNOSIS — I10 Essential (primary) hypertension: Secondary | ICD-10-CM

## 2019-08-13 ENCOUNTER — Ambulatory Visit (INDEPENDENT_AMBULATORY_CARE_PROVIDER_SITE_OTHER): Payer: 59 | Admitting: Family Medicine

## 2019-08-13 ENCOUNTER — Telehealth: Payer: Self-pay | Admitting: Family Medicine

## 2019-08-13 ENCOUNTER — Encounter: Payer: Self-pay | Admitting: Family Medicine

## 2019-08-13 ENCOUNTER — Other Ambulatory Visit: Payer: Self-pay

## 2019-08-13 ENCOUNTER — Other Ambulatory Visit: Payer: Self-pay | Admitting: *Deleted

## 2019-08-13 VITALS — BP 134/80 | Ht 64.0 in

## 2019-08-13 DIAGNOSIS — L237 Allergic contact dermatitis due to plants, except food: Secondary | ICD-10-CM

## 2019-08-13 NOTE — Patient Instructions (Addendum)
Take the oral steroids as directed.  You may not see it showing up in the computer that it was sent--had trouble finding the longer steroid course in the computer system so we phoned it in--it should be there.  Contact us if you have persistent/worsening symptoms, fever, shortness of breath, or any other concerns.  Remember to use antibacterial ointment to the area on the wrist that looks like there was starting to be some surrounding redness.  If you develop more redness, swelling, pain, drainage that is thicker from the blisters, or other concerns, please let us look at it again.    Poison Ivy Dermatitis Poison ivy dermatitis is redness and soreness of the skin caused by chemicals in the leaves of the poison ivy plant. You may have very bad itching, swelling, a rash, and blisters. What are the causes?  Touching a poison ivy plant.  Touching something that has the chemical on it. This may include animals or objects that have come in contact with the plant. What increases the risk?  Going outdoors often in wooded or [place] areas.  Going outdoors without wearing protective clothing, such as closed shoes, long pants, and a long-sleeved shirt. What are the signs or symptoms?   Skin redness.  Very bad itching.  A rash that often includes bumps and blisters. ? The rash usually appears 48 hours after exposure, if you have been exposed before. ? If this is the first time you have been exposed, the rash may not appear until a week after exposure.  Swelling. This may occur if the reaction is very bad. Symptoms usually last for 1-2 weeks. The first time you develop this condition, symptoms may last 3-4 weeks. How is this treated? This condition may be treated with:  Hydrocortisone cream or calamine lotion to relieve itching.  Oatmeal baths to soothe the skin.  Medicines, such as over-the-counter antihistamine tablets.  Oral steroid medicine for more severe reactions. Follow these  instructions at home: Medicines  Take or apply over-the-counter and prescription medicines only as told by your doctor.  Use hydrocortisone cream or calamine lotion as needed to help with itching. General instructions  Do not scratch or rub your skin.  Put a cold, wet cloth (cold compress) on the affected areas or take baths in cool water. This will help with itching.  Avoid hot baths and showers.  Take oatmeal baths as needed. Use colloidal oatmeal. You can get this at a pharmacy or grocery store. Follow the instructions on the package.  While you have the rash, wash your clothes right after you wear them.  Keep all follow-up visits as told by your health care provider. This is important. How is this prevented?   Know what poison ivy looks like, so you can avoid it. ? This plant has three leaves with flowering branches on a single stem. ? The leaves are glossy. ? The leaves have uneven edges that come to a point at the front.  If you touch poison ivy, wash your skin with soap and water right away. Be sure to wash under your fingernails.  When hiking or camping, wear long pants, a long-sleeved shirt, tall socks, and hiking boots. You can also use a lotion on your skin that helps to prevent contact with poison ivy.  If you think that your clothes or outdoor gear came in contact with poison ivy, rinse them off with a garden hose before you bring them inside your house.  When doing yard work or  gardening, wear gloves, long sleeves, long pants, and boots. Wash your garden tools and gloves if they come in contact with poison ivy.  If you think that your pet has come into contact with poison ivy, wash him or her with pet shampoo and water. Make sure to wear gloves while washing your pet. Contact a doctor if:  You have open sores in the rash area.  You have more redness, swelling, or pain in the rash area.  You have redness that spreads beyond the rash area.  You have fluid,  blood, or pus coming from the rash area.  You have a fever.  You have a rash over a large area of your body.  You have a rash on your eyes, mouth, or genitals.  Your rash does not get better after a few weeks. Get help right away if:  Your face swells or your eyes swell shut.  You have trouble breathing.  You have trouble swallowing. These symptoms may be an emergency. Do not wait to see if the symptoms will go away. Get medical help right away. Call your local emergency services (911 in the [place] Do not drive yourself to the hospital. Summary  Poison ivy dermatitis is redness and soreness of the skin caused by chemicals in the leaves of the poison ivy plant.  You may have skin redness, very bad itching, swelling, and a rash.  Do not scratch or rub your skin.  Take or apply over-the-counter and prescription medicines only as told by your doctor. This information is not intended to replace advice given to you by your health care provider. Make sure you discuss any questions you have with your health care provider. Document Released: 12/11/2010 Document Revised: 03/02/2019 Document Reviewed: 11/03/2018 Elsevier Patient Education  2020 [employer].

## 2019-08-13 NOTE — Progress Notes (Signed)
Start time: 2:50 End time: 3:03  Virtual Visit via Video Note  I connected with [name] on 08/13/19  by a video enabled telemedicine application and verified that I am speaking with the correct person using two identifiers.  Location: Patient: home, son with her Provider: office   I discussed the limitations of evaluation and management by telemedicine and the availability of in person appointments. The patient expressed understanding and agreed to proceed.  History of Present Illness:  Chief Complaint  Patient presents with  . Rash    VIRTUAL rash is on left arm, neck and left leg and may be spreading onto the right leg. She was burning wood this weekend and thinks she picked up poison ivy-starting to weep on left arm. Has had before.    She was burning wood 4 days ago, had been picking up tree limbs. She didn't see poison ivy, but wouldn't be surprised if it was mixed Rash showed up 3 days ago (the following day, after carrying the branches). Started on her left neck, left forearm, left leg.  She has noticed spread since then--spread to the right leg just this morning (had only been on left initially).  Has also noticed new areas on the left arm.  She has had allergic reaction to poison ivy in the past (worse was in 3rd grade).    PMH, PSH, SH reviewed Outpatient Encounter Medications as of 08/13/2019  Medication Sig  . amphetamine-dextroamphetamine (ADDERALL XR) 20 MG 24 hr capsule Take 1 capsule (20 mg total) by mouth daily.  Marland Kitchen amphetamine-dextroamphetamine (ADDERALL) 10 MG tablet Take 1 tablet (10 mg total) by mouth daily after lunch.  . losartan-hydrochlorothiazide (HYZAAR) 50-12.5 MG tablet TAKE 1 TABLET BY MOUTH EVERY DAY  . sertraline (ZOLOFT) 100 MG tablet Take 1 tablet (100 mg total) by mouth at bedtime.  . [DISCONTINUED] amphetamine-dextroamphetamine (ADDERALL XR) 20 MG 24 hr capsule Take 1 capsule (20 mg total) by mouth daily.  . [DISCONTINUED]  amphetamine-dextroamphetamine (ADDERALL XR) 20 MG 24 hr capsule Take 1 capsule (20 mg total) by mouth daily.  . [DISCONTINUED] predniSONE (STERAPRED DS 12 DAY PO) Take by mouth.  . ALPRAZolam (XANAX) 0.25 MG tablet Take 1 tablet (0.25 mg total) by mouth 2 (two) times daily as needed for anxiety. (Patient not taking: Reported on 08/13/2019)  . [DISCONTINUED] amphetamine-dextroamphetamine (ADDERALL XR) 15 MG 24 hr capsule Take 1 capsule by mouth every morning.  . [DISCONTINUED] amphetamine-dextroamphetamine (ADDERALL) 10 MG tablet Take 1 tablet (10 mg total) by mouth daily after lunch.  . [DISCONTINUED] amphetamine-dextroamphetamine (ADDERALL) 10 MG tablet Take 1 tablet (10 mg total) by mouth daily after lunch.   No facility-administered encounter medications on file as of 08/13/2019.    No Known Allergies  ROS: no fever, chills, URI symptoms, shortness of breath, wheezing.  Just rash per HPI  Observations/Objective:  BP 134/80   Ht 5\' 4"  (1.626 m)   LMP 08/11/2019 (Exact Date)   BMI 29.21 kg/m   Pleasant, well-appearing female in no distress. She has linear areas of erythema on the anterior neck, some of which appear like a scratch, others have clusters of vesicles.  The left forearm has significantly more vesicles, many of which in linear patterns.  One near the wrist has some mild surrounding erythema.  Leg wasn't examined through video.  She is alert, oriented, normal mood, affect, grooming, eye contact and speech.  Cranial nerves grossly intact.   Assessment and Plan:  Plant allergic contact dermatitis - counseled  re: avoidance in future.  Given new lesions/spread, needs oral steroids. 12 d Prednisone Pak called in.  Risks/SE of prednisone reviewed in detail. Also reviewed s/sx of cellulitis/infection (since one area looked red), to use antibacterial ointment to that area.  F/u if symptoms persist/worsen.   Follow Up Instructions:    I discussed the assessment and treatment  plan with the patient. The patient was provided an opportunity to ask questions and all were answered. The patient agreed with the plan and demonstrated an understanding of the instructions.   The patient was advised to call back or seek an in-person evaluation if the symptoms worsen or if the condition fails to improve as anticipated.  I provided 13 minutes of non-face-to-face time during this encounter.   Vikki Ports, MD

## 2019-08-13 NOTE — Telephone Encounter (Signed)
appt 08-14-19

## 2019-08-13 NOTE — Telephone Encounter (Signed)
Set this up as a virtual visit

## 2019-08-13 NOTE — Telephone Encounter (Signed)
Pt called and wanted to see if she can get her Adderrall switched to Vyvance instead because the Adderrall is not working for her.

## 2019-08-14 ENCOUNTER — Ambulatory Visit (INDEPENDENT_AMBULATORY_CARE_PROVIDER_SITE_OTHER): Payer: 59 | Admitting: Family Medicine

## 2019-08-14 ENCOUNTER — Encounter: Payer: Self-pay | Admitting: Family Medicine

## 2019-08-14 VITALS — BP 126/88 | HR 84 | Temp 98.0°F | Wt 170.0 lb

## 2019-08-14 DIAGNOSIS — F902 Attention-deficit hyperactivity disorder, combined type: Secondary | ICD-10-CM

## 2019-08-14 DIAGNOSIS — I6310 Cerebral infarction due to embolism of unspecified precerebral artery: Secondary | ICD-10-CM

## 2019-08-14 MED ORDER — LISDEXAMFETAMINE DIMESYLATE 50 MG PO CAPS: 50.0000 mg | ORAL_CAPSULE | Freq: Every day | ORAL | 0 refills | Status: DC

## 2019-08-14 NOTE — Progress Notes (Signed)
   Subjective:    Patient ID: Luis Abed, female    DOB: [birth year], 38 y.o.   MRN: [id number]  HPI Documentation for virtual telephone encounter.  Documentation for virtual audio and video telecommunications through doximity encounter: The patient was located at home. The provider was located in the office. The patient did consent to this visit and is aware of possible charges through their insurance for this visit. The other persons participating in this telemedicine service were none. Time spent on call was 10 minutes  This virtual service is not related to other E/M service within previous 7 days. She does have underlying ADD and has been on Adderall.  She was started back on this after she had a CVA and has noticed that it does help with focus but also makes her quite tired.  Her lack of focus returns when her Adderall wears off.  She is still having some residual double vision and plans to see the ophthalmologist for corrective surgery on that soon.  In the past she did use Vyvanse but it was too expensive and would like to try this again.  She has 4 children and they are all at home due to [place].   Review of Systems     Objective:   Physical Exam Alert and in no distress with appropriate affect and normal speech.       Assessment & Plan:  Attention deficit hyperactivity disorder (ADHD), combined type - Plan: lisdexamfetamine (VYVANSE) 50 MG capsule  Cerebrovascular accident (CVA) due to embolism of precerebral artery ([place]) Will start on the 50 mg.  She is to call me in 2 to 3 weeks to let me know how she is doing.

## 2019-08-21 ENCOUNTER — Telehealth: Payer: Self-pay | Admitting: *Deleted

## 2019-08-21 NOTE — Telephone Encounter (Signed)
Left message for patient to contact Dr. Clydene Fake office for permission

## 2019-08-21 NOTE — Telephone Encounter (Signed)
Pt needs to call Dr Leonie Man from Neurology

## 2019-08-21 NOTE — Telephone Encounter (Signed)
Patient left a message stating that she is going to have eye surgery at [facility].  They are asking for permission to use anesthesia for patients procedure.  They are concerned about her post-stroke status.

## 2019-08-22 ENCOUNTER — Other Ambulatory Visit: Payer: Self-pay | Admitting: Family Medicine

## 2019-08-22 NOTE — Telephone Encounter (Signed)
Is this okay to refill? 

## 2019-08-23 ENCOUNTER — Telehealth: Payer: Self-pay | Admitting: Adult Health

## 2019-08-23 NOTE — Telephone Encounter (Signed)
LMVM for pt to call back about appt scheduled Monday at 0915. Per JM/NP would like to see pt in office.  If ok with this time already set or need to reschedule?

## 2019-08-23 NOTE — Telephone Encounter (Signed)
I called pt.  She is going to have eye surgery scheduled right now for 09-05-19 (R eye diploplia). She took herself off apirin about 3 months ago (she states she does not know why (but then she stated she felt she was low risk).  She is taking Bp meds.  I relayed that per last ofv note, that she was to continue 325mg  aspirin for secondary stroke prevention.  I made a mychart VV 08-27-19 to address issues.   I would let JM/NP know and if she wanted to do things differently (like to have pt come in office) will call her back.  (the appt for surgery is not set in stone).

## 2019-08-23 NOTE — Telephone Encounter (Signed)
I LMVM for pt to return call for changing appt to Office Visit and she need to start taking the 325mg  aspirin on dialy basis.  She is to call me back.

## 2019-08-23 NOTE — Telephone Encounter (Signed)
Pt called in and stated she needs surgical clearance to have eye surgery

## 2019-08-23 NOTE — Telephone Encounter (Signed)
I saw her last for a virtual visit in 02/2019.  It is requested that she have been in office visit as it was recommended to follow-up in 4 months.  She should continue aspirin lifelong for secondary stroke prevention.  We can discuss clearance for surgery and likely repeat imaging for monitoring of prior dissection.

## 2019-08-27 ENCOUNTER — Telehealth (INDEPENDENT_AMBULATORY_CARE_PROVIDER_SITE_OTHER): Payer: 59 | Admitting: Adult Health

## 2019-08-27 ENCOUNTER — Encounter: Payer: Self-pay | Admitting: Adult Health

## 2019-08-27 VITALS — BP 131/86 | HR 95

## 2019-08-27 DIAGNOSIS — I7774 Dissection of vertebral artery: Secondary | ICD-10-CM

## 2019-08-27 DIAGNOSIS — I10 Essential (primary) hypertension: Secondary | ICD-10-CM

## 2019-08-27 DIAGNOSIS — I63331 Cerebral infarction due to thrombosis of right posterior cerebral artery: Secondary | ICD-10-CM

## 2019-08-27 DIAGNOSIS — I69393 Ataxia following cerebral infarction: Secondary | ICD-10-CM | POA: Diagnosis not present

## 2019-08-27 DIAGNOSIS — H532 Diplopia: Secondary | ICD-10-CM

## 2019-08-27 MED ORDER — ASPIRIN EC 325 MG PO TBEC: 325.0000 mg | DELAYED_RELEASE_TABLET | Freq: Every day | ORAL | 0 refills | Status: DC

## 2019-08-27 NOTE — Progress Notes (Signed)
I agree with the above plan 

## 2019-08-27 NOTE — Progress Notes (Signed)
[facility] [address] [place]. [place] [address] [phone]       VIRTUAL VISIT FOLLOW UP NOTE  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   Tonya Hum, RN discussed the limitations of evaluation and management by telemedicine and the availability of in person appointments. The patient expressed understanding and agreed to proceed.   CHIEF COMPLAINT:  Chief Complaint  Patient presents with   Cerebrovascular Accident    dizziness - improving   Medical Clearance    Clearance to undergo eye procedure on 09/05/2019    HPI:  Stroke admission 01/04/2019: [name] a 38 y.o.femalepast medical history of ADD and panic attacks, who presented to [facility] ED for evaluation of an episode of panic attack. According to the husband, she had been complaining of days to weeks of right-sided neck pain. No other focal deficits were noted by the family. The morning of admission, she woke up with a bad headache and neck pain along with feelings of anxiety. When attempts were made in the ED to have her ambulate, she was very wobbly and ataxic. That evening, a CT head obtained due to lethargy and ongoing complaints of dizziness. CT head reviewed and showed a evolving acute ischemic right cerebellar and bilateral PCA territory infarcts with involvement of bilateral occipital lobes and thalami along with trace petechial hemorrhage at the occipital poles without frank hemorrhagic transformation. There was associated mass-effect within the right posterior fossa with partial effacement  of the right basilar cisterns and fourth ventricle but no obstructive hydrocephalus at the time. CTA head and neck showed acute right vertebral artery dissection involving the right V3 segment at the level of the right C1 transverse foramen with probable small dissection flap just distally at the right V3 V4 junction with associated moderate stenosis. There is also probable distal bilateral P3 P4 occlusions possibly thromboembolic in nature. No stenosis or occlusions in the anterior circulation. Denies any neck trauma, manipulation or accidents. MRI brain reviewed and showed multifocal right cerebellar infarction relfecting R VA (B thalamic, left temporal lobe and right occipital lobe reflecting thalamoperforate and B PCA) with significant mass effect on fourth ventricle without hydrocephalus. 2D echo showed EF of 60-65% without cardiac source of embolus.  LDL 88 and A1c 5.7. Other stroke risk factors include obesity but no prior history of stroke. She was discharged to CIR in stable condition on 01/03/19 and discharged home on 01/16/19 with outpatient therapy recommendations.   Virtual visit 02/22/2019: She reports since being home she has been doing well with continued residual deficits of right hemiparesis and visual deficits. She has returned back to all prior activites but does need moderate assistance with cooking due to right arm weakness. She was participating in therapy thru neuro rehab but due to pandemic, this has been postponed.  She has a difficult time explaining her visual concerns but per what she reports, appears to be diplopia right eye greater than left eye.  Does not appear to have difficulties with peripheral vision.  She states that when she attempts to read something or  watch TV, she will close her right eye and is able to see much clear throughout her left.  She plans on calling neuro-ophthalmology Dr. Frederico Washington which was recommended during CIR.  She does endorse occasional dizziness only when  she tilts her head up but will resolve once in the neutral position.  She denies any other periods of dizziness or balance difficulties.  She denies neck pain or headaches. She continues on aspirin 325mg  daily without side effects of bleeding or bruising.  She does monitor BP at home and was recently started on hydrochlorothiazide by her PCP on 02/05/2019 with BP typically ranging 130s/90s.  She did check BP during visit which resulted in 141/99 and heart rate 87.  She does endorses being typically higher than what she has been obtaining prior and feels as though it is due to increased anxiety with virtual visit along with attempting to care for her 2 children during the visit as her husband is currently working.  No further concerns at this time.  Denies new or worsening stroke/TIA symptoms.  Update 09/01/2019 (virtual visit): [name] is being seen today for stroke follow-up and clearance to undergo surgical eye procedure with Dr. Frederico Washington [facility]).  Residual deficits of occasional dizziness and mild decreased RUE coordination/ataxia but improvement. Continues to have diplopia but optho believes due to right eye higher than left  Plans on undergoing procedure that will correct muscles of right eye due to ongoing diplopia. At prior visit, recommended continuation of aspirin 325 mg daily but she has discontinued this approximately 3 months ago as she did not think it was necessary to continue.  No repeat imaging has been done at this time due to prior visit imaging limited due to emergent needs only due to pandemic restrictions.  It was recommended to follow-up in 4 months but unfortunately that did not happen.  Blood pressure check during virtual visit at 131/86.  Typically SBP 120-130.  Continues on valsartan-hydrochlorothiazide daily.  No further concerns at this time.      ROS:   14 system review of systems performed and negative with exception of visual loss, weakness, gait difficulty and  dizziness  PMH:  Past Medical History:  Diagnosis Date   ADD (attention deficit disorder)    Exercise-induced asthma    Hx of varicella    Kidney stones    S/P cesarean section 05/05/2016   S/P tubal ligation 05/05/2016   Stroke [facility])    Term pregnancy, repeat 05/04/2016    PSH:  Past Surgical History:  Procedure Laterality Date   BACK SURGERY     scoliosis age 25   BILATERAL SALPINGECTOMY Bilateral 05/05/2016   Procedure: BILATERAL SALPINGECTOMY;  Surgeon: Janyth Contes, MD;  Location: [place];  Service: Obstetrics;  Laterality: Bilateral;   CESAREAN SECTION     CESAREAN SECTION N/A 05/05/2016   Procedure: CESAREAN SECTION;  Surgeon: Janyth Contes, MD;  Location: [place];  Service: Obstetrics;  Laterality: N/A;   CHOLECYSTECTOMY     HAND SURGERY     HERNIA REPAIR     KIDNEY STONE SURGERY     R--surgery; L lithotripsy.  Dr. Gaynelle Arabian    Social History:  Social History   Socioeconomic History   Marital status: Married    Spouse name: Not on file   Number of children: Not on file   Years of education: Not on file   Highest education level: Not on file  Occupational History   Not on file  Social Designer, fashion/clothing strain: Not on file   Food insecurity    Worry: Not on file    Inability: Not on file   Transportation needs    Medical: Not on file    Non-medical: Not on file  Tobacco Use   Smoking status: Never Smoker   Smokeless tobacco: Never Used  Substance and Sexual Activity   Alcohol use: No    Comment: occasional wine   Drug use: No   Sexual activity: Yes    Partners: Male    Birth control/protection: Surgical  Lifestyle   Physical activity    Days per week: Not on file    Minutes per session: Not on file   Stress: Not on file  Relationships   Social connections    Talks on phone: Not on file    Gets together: Not on file    Attends religious service: Not on file    Active  member of club or organization: Not on file    Attends meetings of clubs or organizations: Not on file    Relationship status: Not on file   Intimate partner violence    Fear of current or ex partner: Not on file    Emotionally abused: Not on file    Physically abused: Not on file    Forced sexual activity: Not on file  Other Topics Concern   Not on file  Social History Narrative   Not on file    Family History:  Family History  Problem Relation Age of Onset   Cancer Mother    Cancer Maternal Aunt    Cancer Maternal Grandmother     Medications:   Current Outpatient Medications on File Prior to Visit  Medication Sig Dispense Refill   amphetamine-dextroamphetamine (ADDERALL) 10 MG tablet Take 1 tablet (10 mg total) by mouth daily after lunch. 30 tablet 0   lisdexamfetamine (VYVANSE) 50 MG capsule Take 1 capsule (50 mg total) by mouth daily. 30 capsule 0   losartan-hydrochlorothiazide (HYZAAR) 50-12.5 MG tablet TAKE 1 TABLET BY MOUTH EVERY DAY 90 tablet 0   sertraline (ZOLOFT) 100 MG tablet TAKE 1 TABLET (100 MG TOTAL) BY MOUTH AT BEDTIME. 30 tablet 2   No current facility-administered medications on file prior to visit.     Allergies:  No Known Allergies   Physical Exam  Vitals:   08/27/19 0925  BP: 131/86  Pulse: 95   *Obtained by patient in her own home by her own machine during visit*   General: well developed, well nourished, pleasant young Caucasian female, seated, in no evident distress Head: head normocephalic and atraumatic.    Neurologic Exam Mental Status: Awake and fully alert. Oriented to place and time. Recent and remote memory intact. Attention span, concentration and fund of knowledge appropriate. Mood and affect appropriate.  Cranial Nerves: Extraocular movements full without nystagmus.  Asymmetric eyes with right higher than left.  Visual fields unable to assess.  Hearing is intact to voice.  Shoulder shrug symmetric bilaterally.  Face,  tongue, palate moves normally and symmetrically.  Motor: No evidence of large muscle weakness per drift assessment but possible decreased right hand strength with decreased finger tapping Sensory.:  Intact to light touch Cerebellar: Finger-to-nose with ataxia RUE; heel-to-shin with ataxia RLE  Gait and Station: Deferred     Diagnostic Data (Labs, Imaging, Testing)  Ct Angio Head W Or Wo Contrast 01/02/2019 IMPRESSION:   CT HEAD  IMPRESSION  1. Evolving acute ischemic right cerebellar  and bilateral PCA territory infarcts as above, with involvement of the bilateral occipital lobes and thalami. Associated trace petechial hemorrhage at the occipital poles without frank hemorrhagic transformation.  2. Associated mass effect within the right posterior fossa with partial effacement of the right basilar cisterns and fourth ventricle. No obstructive hydrocephalus at this time.   CTA HEAD AND NECK  IMPRESSION  1. Acute right vertebral artery dissection, involving the right V3 segment at the level of the right C1 transverse foramen, with probable small dissection flap just distally at the right V3/V4 junction. Associated moderate stenosis.  2. 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  3. Otherwise negative   CTA of the head and neck.  Normal left vertebral artery. 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    Ct Head Wo Contrast 01/05/2019 IMPRESSION:  1. 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  2. Otherwise expected evolution of thalamic and bilateral occipital lobe infarcts without acute hemorrhage.   Ct Head Wo Contrast 01/03/2019 IMPRESSION:  1. Unchanged appearance of posterior circulation infarcts involving the right cerebellum, both thalami and both occipital  lobes.  2. Unchanged posterior fossa edema with narrowing of the basal cisterns and fourth ventricle without hydrocephalus.     Mr Brain Wo Contrast 01/03/2019 IMPRESSION:  Multifocal areas of acute infarction, predominantly affecting the RIGHT cerebellum reflecting RIGHT vertebral dissection, but also areas of acute infarction elsewhere in the posterior circulation, notably both thalami, LEFT temporal lobe, and RIGHT occipital lobe reflecting thalamoperforate and BILATERAL PCA territory ischemia, consistent with distal emboli. Abnormal flow void RIGHT vertebral reflecting the previously identified dissection. No features to suggest hemorrhagic transformation. Significant mass effect on the outlet foramina the fourth ventricle without current features of hydrocephalus. Close continued surveillance is warranted.    ASSESSMENT: [name] is a 38 y.o. 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 Vascular risk factors include obesity.  Residual deficits of occasional dizziness and decreased RUE coordination and ataxia.  Continues to follow with ophthalmology with diplopia likely secondary to right eye higher than left eye and plans on undergoing corrective procedure on 09/05/2019.    PLAN:  1. R PCA infarct : Restart aspirin 325 mg daily for secondary stroke prevention.  Advised patient that this will be a lifelong medication for secondary stroke prevention.  Maintain strict control of hypertension with blood pressure goal between 130-150, diabetes with hemoglobin A1c goal below 6.5% and cholesterol with LDL cholesterol (bad cholesterol) goal below 70 mg/dL.  I also advised the patient to eat a healthy diet with plenty of whole grains, cereals, fruits and vegetables, exercise regularly with at least 30 minutes of continuous activity daily and maintain ideal body weight. 2. Right VA dissection: Repeat CTA head/neck for  monitoring of prior dissection.  Advised patient that BP goal 130-150 until improvement of dissection to ensure adequate perfusion 3. HTN: Advised to continue current treatment regimen.  Today's BP satisfactory.  Advised to continue to monitor at home along with continued follow-up with PCP for management 4. Surgical clearance: Has been stable from a stroke standpoint and greater than 6 months post stroke.  She is cleared to undergo procedure and okay to hold aspirin if needed prior to procedure and recommend restarting immediately after.  Advised patient there is  a small but acceptable preprocedure risk of recurrent stroke while off aspirin therapy.  If imaging shows continued dissection, it will be important to avoid hypotension during procedure which was also discussed with patient.    Follow up in 3 months or call earlier if needed   Greater than 50% of time during this 25 minute visit was spent on counseling, explanation of diagnosis of right PCA infarct, discussion regarding repeat imaging for monitoring of prior dissection, discussion regarding surgical clearance, reviewing risk factor management of HTN, planning of further management along with potential future management, and answered all questions to patient satisfaction    Frann Rider, AGNP-BC  [facility] [address] [place], [place] [address]  Phone [phone] Fax [phone] Note: This document was prepared with digital dictation and possible smart phrase technology. Any transcriptional errors that result from this process are unintentional.

## 2019-08-28 ENCOUNTER — Telehealth: Payer: Self-pay | Admitting: Adult Health

## 2019-08-28 NOTE — Telephone Encounter (Signed)
[employer] pending faxed notes/medicaid.

## 2019-08-29 NOTE — Telephone Encounter (Signed)
I called to check the status they did receive my fax and it is still pending.  °

## 2019-08-30 ENCOUNTER — Encounter (HOSPITAL_BASED_OUTPATIENT_CLINIC_OR_DEPARTMENT_OTHER): Payer: Self-pay | Admitting: Physician Assistant

## 2019-08-30 NOTE — Telephone Encounter (Signed)
[place]: [phone] (exp. 08/29/19 to 10/13/19)  [employer] Auth: [id number] (exp. 08/29/19 to 10/13/19)  Patient is scheduled at GI for 09/04/19.

## 2019-09-04 ENCOUNTER — Ambulatory Visit
Admission: RE | Admit: 2019-09-04 | Discharge: 2019-09-04 | Disposition: A | Discharge status: Home / Self Care | Payer: 59 | Source: Ambulatory Visit | Attending: Adult Health | Admitting: Adult Health

## 2019-09-04 ENCOUNTER — Other Ambulatory Visit: Payer: Self-pay

## 2019-09-04 MED ORDER — IOPAMIDOL (ISOVUE-370) INJECTION 76%
75.0000 mL | Freq: Once | INTRAVENOUS | Status: AC | PRN
  Administered 2019-09-04: 75 mL via INTRAVENOUS

## 2019-09-05 ENCOUNTER — Ambulatory Visit (HOSPITAL_BASED_OUTPATIENT_CLINIC_OR_DEPARTMENT_OTHER): Admission: RE | Admit: 2019-09-05 | Payer: 59 | Source: Home / Self Care | Admitting: Ophthalmology

## 2019-09-05 ENCOUNTER — Encounter (HOSPITAL_BASED_OUTPATIENT_CLINIC_OR_DEPARTMENT_OTHER): Admission: RE | Payer: Self-pay | Source: Home / Self Care

## 2019-09-05 ENCOUNTER — Other Ambulatory Visit: Payer: Self-pay | Admitting: Family Medicine

## 2019-09-05 SURGERY — REPAIR, MUSCLE, MEDIAL RECTUS
Anesthesia: General | Laterality: Right

## 2019-09-05 NOTE — Telephone Encounter (Signed)
Is this okay to refill? 

## 2019-09-06 ENCOUNTER — Encounter: Payer: Self-pay | Admitting: Family Medicine

## 2019-09-06 NOTE — Telephone Encounter (Signed)
Pt. called and needs this refilled at [facility]

## 2019-09-06 NOTE — Progress Notes (Unsigned)
The Vyvanse is working well but she does need an extra amount of Adderall to help later on in the afternoon and early evening with focus.  I will continue her on that regimen

## 2019-09-10 ENCOUNTER — Encounter: Payer: 59 | Admitting: Physical Medicine & Rehabilitation

## 2019-09-11 ENCOUNTER — Telehealth: Payer: Self-pay | Admitting: Family Medicine

## 2019-09-11 DIAGNOSIS — F902 Attention-deficit hyperactivity disorder, combined type: Secondary | ICD-10-CM

## 2019-09-11 MED ORDER — LISDEXAMFETAMINE DIMESYLATE 50 MG PO CAPS: 50.0000 mg | ORAL_CAPSULE | Freq: Every day | ORAL | 0 refills | Status: DC

## 2019-09-11 NOTE — Telephone Encounter (Signed)
Pt needs refill on Vyvanse sent to [facility]

## 2019-09-12 ENCOUNTER — Telehealth: Payer: Self-pay

## 2019-09-12 NOTE — Telephone Encounter (Signed)
Spoke with the patient and she verbalized understanding her results. She asked would she be able to have surgery eye surgery due to the anesthesia. She stated that she would need clearance from [place]. Surgery hasn't been scheduled yet. Please advise.

## 2019-09-12 NOTE — Telephone Encounter (Signed)
She is cleared to undergo surgical procedure.  Her ophthalmology office will need to send [place] the clearance form

## 2019-09-17 NOTE — Telephone Encounter (Signed)
Spoke with the patient and she stated that she would have the ophthalmology office fax over a surgical clearance form to our office.

## 2019-09-26 ENCOUNTER — Telehealth: Payer: Self-pay | Admitting: Adult Health

## 2019-09-26 ENCOUNTER — Encounter: Payer: Self-pay | Admitting: *Deleted

## 2019-09-26 NOTE — Telephone Encounter (Signed)
I called [facility] spoke with [facility].  Fax [phone] regarding clearance specifics for pt.

## 2019-09-26 NOTE — Telephone Encounter (Signed)
Lady from [facility] called wanting to speak to the RN or provider about the clearance that they are needing for the pt to be able to get anesthesia. Lady states they do not have a clearance form so they are needing some kind of letter sent to them. Please advise.

## 2019-10-01 ENCOUNTER — Other Ambulatory Visit: Payer: Self-pay | Admitting: Family Medicine

## 2019-10-01 NOTE — Telephone Encounter (Signed)
[facility] is requesting to fill pt adderall. Please advise [facility]

## 2019-10-09 ENCOUNTER — Telehealth: Payer: Self-pay | Admitting: Family Medicine

## 2019-10-09 ENCOUNTER — Other Ambulatory Visit: Payer: Self-pay | Admitting: Family Medicine

## 2019-10-09 DIAGNOSIS — F902 Attention-deficit hyperactivity disorder, combined type: Secondary | ICD-10-CM

## 2019-10-09 MED ORDER — LISDEXAMFETAMINE DIMESYLATE 50 MG PO CAPS: 50.0000 mg | ORAL_CAPSULE | Freq: Every day | ORAL | 0 refills | Status: DC

## 2019-10-09 NOTE — Telephone Encounter (Signed)
[facility] is requesting to fill pt vyvanse. Please advise [facility]

## 2019-10-09 NOTE — Telephone Encounter (Signed)
Pt needs refill on Vyvanse sent to [facility].

## 2019-10-15 NOTE — Telephone Encounter (Signed)
Received fax confirmation for letter relating to clearance ([facility]).  To [facility].  [phone].

## 2019-10-23 HISTORY — PX: MUSCLE RECESSION AND RESECTION: SHX5209

## 2019-10-24 DIAGNOSIS — H5021 Vertical strabismus, right eye: Secondary | ICD-10-CM | POA: Diagnosis not present

## 2019-10-30 ENCOUNTER — Other Ambulatory Visit: Payer: Self-pay | Admitting: Family Medicine

## 2019-10-30 ENCOUNTER — Telehealth: Payer: Self-pay | Admitting: Family Medicine

## 2019-10-30 MED ORDER — AMPHETAMINE-DEXTROAMPHETAMINE 10 MG PO TABS: ORAL_TABLET | ORAL | 0 refills | Status: DC

## 2019-10-30 NOTE — Telephone Encounter (Signed)
Pt called requesting a refill on her adderall 10 mg please send to  [facility] [place]

## 2019-10-30 NOTE — Telephone Encounter (Signed)
[facility] is requesting to fill pt adderall. Please advise [facility]

## 2019-11-06 ENCOUNTER — Ambulatory Visit (INDEPENDENT_AMBULATORY_CARE_PROVIDER_SITE_OTHER): Payer: 59 | Admitting: Family Medicine

## 2019-11-06 ENCOUNTER — Encounter: Payer: Self-pay | Admitting: Family Medicine

## 2019-11-06 ENCOUNTER — Other Ambulatory Visit: Payer: Self-pay

## 2019-11-06 VITALS — BP 122/82 | HR 80 | Temp 97.5°F | Wt 163.2 lb

## 2019-11-06 DIAGNOSIS — K146 Glossodynia: Secondary | ICD-10-CM

## 2019-11-06 DIAGNOSIS — R21 Rash and other nonspecific skin eruption: Secondary | ICD-10-CM | POA: Diagnosis not present

## 2019-11-06 LAB — POCT RAPID STREP A (OFFICE): Rapid Strep A Screen: NEGATIVE

## 2019-11-06 NOTE — Progress Notes (Signed)
   Subjective:    Patient ID: [name], female    DOB: [birth year], 38 y.o.   MRN: [id number]  HPI She complains of erythema and swelling to her face, anterior upper chest and painful tongue.  No new soaps, detergents, colognes, fever, chills, sore throat, cough or congestion.   Review of Systems     Objective:   Physical Exam Face is slightly swollen and erythematous.  The tongue does have a strawberry appearance however the pharyngeal area appeared normal.  No cervical adenopathy.  Her upper chest is slightly erythematous with a question of involvement only in the exposed area of her anterior chest.  No lesions noted on the abdomen or under her bra or panty line.  Strep screen was negative.     Assessment & Plan:  Rash - Plan: Rapid Strep A  Painful tongue I explained that it was not clear-cut exactly what is going on.  Recommend gargling with what ever works.  Tylenol for discomfort.  Recheck here if continued difficulty.

## 2019-11-21 ENCOUNTER — Telehealth: Payer: Self-pay | Admitting: Genetic Counselor

## 2019-11-21 NOTE — Telephone Encounter (Signed)
A genetic counseling appt has been scheduled for [name] to see Roma Kayser on 1/6 at 9am. I verified the pt's email and demographic info. Pt has an active Mychart acct and is aware of the link where she will be able to use to login to her appt. Pt voiced understanding.

## 2019-11-24 ENCOUNTER — Other Ambulatory Visit: Payer: Self-pay | Admitting: Family Medicine

## 2019-11-26 NOTE — Telephone Encounter (Signed)
Pt. Called stating that she needs a refill on her Zoloft took her last one Friday didn't realize she was that low and needs refill asap, pt. Last apt. Was 11/06/19. To [facility].

## 2019-11-27 ENCOUNTER — Encounter: Payer: Self-pay | Admitting: Genetic Counselor

## 2019-11-28 ENCOUNTER — Inpatient Hospital Stay: Payer: 59 | Admitting: Genetic Counselor

## 2019-12-04 ENCOUNTER — Telehealth: Payer: Self-pay

## 2019-12-04 NOTE — Telephone Encounter (Signed)
Unable to get in contact with the patient. LVM letting her knwo that her appt with Janett Billow will need to be converted into a mychart video visit due to Janett Billow being out of the office. Office number was provided.    If patient calls back please convert her office visit into a mychart video visit with Janett Billow.

## 2019-12-04 NOTE — Telephone Encounter (Addendum)
LVM to convert office visit into a mychart video visit

## 2019-12-05 ENCOUNTER — Ambulatory Visit: Payer: 59 | Admitting: Adult Health

## 2019-12-10 ENCOUNTER — Inpatient Hospital Stay: Payer: 59 | Admitting: Genetic Counselor

## 2019-12-11 ENCOUNTER — Encounter: Payer: Self-pay | Admitting: Genetic Counselor

## 2019-12-11 ENCOUNTER — Ambulatory Visit (HOSPITAL_BASED_OUTPATIENT_CLINIC_OR_DEPARTMENT_OTHER): Payer: 59 | Admitting: Genetic Counselor

## 2019-12-11 DIAGNOSIS — Z808 Family history of malignant neoplasm of other organs or systems: Secondary | ICD-10-CM | POA: Diagnosis not present

## 2019-12-11 DIAGNOSIS — Z803 Family history of malignant neoplasm of breast: Secondary | ICD-10-CM

## 2019-12-11 DIAGNOSIS — Z8052 Family history of malignant neoplasm of bladder: Secondary | ICD-10-CM

## 2019-12-11 DIAGNOSIS — Z8049 Family history of malignant neoplasm of other genital organs: Secondary | ICD-10-CM

## 2019-12-11 NOTE — Progress Notes (Signed)
REFERRING PROVIDER: Janyth Contes, MD [place],  [place] [address]  PRIMARY PROVIDER:  Denita Lung, MD  PRIMARY REASON FOR VISIT:  1. Family history of breast cancer   2. Family history of bladder cancer   3. Family history of skin cancer   4. Family history of cervical cancer   5. Family history of brain cancer      I connected with [name] on 12/11/2019 at 11:00 am EDT by MyChart video conference and verified that I am speaking with the correct person using two identifiers.   Patient location: Home Provider location: [place] Office  HISTORY OF PRESENT ILLNESS:   [name], a 39 y.o. female, was seen for a [facility] cancer genetics consultation at the request of Dr. Sandford Craze due to a family history of breast cancer.  [name] presents to clinic today to discuss the possibility of a hereditary predisposition to cancer, genetic testing, and to further clarify her future cancer risks, as well as potential cancer risks for family members.   [name] is a 39 y.o. female with no personal history of cancer.    CANCER HISTORY:  Oncology History   No history exists.     RISK FACTORS:  Menarche was at age 56.  First live birth at age 44.  OCP use for approximately 6 years.  Ovaries intact: yes.  Hysterectomy: no.  Menopausal status: premenopausal.  HRT use: 0 years. Colonoscopy: no; she had an upper endoscopy after gallbladder surgery in 2013 or 2014 that was normal per patient. Mammogram within the last year: no. Number of breast biopsies: 0. Any excessive radiation exposure in the past: no  Past Medical History:  Diagnosis Date  . ADD (attention deficit disorder)   . Exercise-induced asthma   . Family history of bladder cancer   . Family history of brain cancer   . Family history of breast cancer   . Family history of cervical cancer   . Family history of skin cancer   . Hx of varicella   . Kidney stones    . S/P cesarean section 05/05/2016  . S/P tubal ligation 05/05/2016  . Stroke ([facility])   . Term pregnancy, repeat 05/04/2016    Past Surgical History:  Procedure Laterality Date  . BACK SURGERY     scoliosis age 23  . BILATERAL SALPINGECTOMY Bilateral 05/05/2016   Procedure: BILATERAL SALPINGECTOMY;  Surgeon: Janyth Contes, MD;  Location: [facility];  Service: Obstetrics;  Laterality: Bilateral;  . CESAREAN SECTION    . CESAREAN SECTION N/A 05/05/2016   Procedure: CESAREAN SECTION;  Surgeon: Janyth Contes, MD;  Location: [facility];  Service: Obstetrics;  Laterality: N/A;  . CHOLECYSTECTOMY    . HAND SURGERY    . HERNIA REPAIR    . KIDNEY STONE SURGERY     R--surgery; L lithotripsy.  Dr. Gaynelle Arabian    Social History   Socioeconomic History  . Marital status: Married    Spouse name: Not on file  . Number of children: Not on file  . Years of education: Not on file  . Highest education level: Not on file  Occupational History  . Not on file  Tobacco Use  . Smoking status: Never Smoker  . Smokeless tobacco: Never Used  Substance and Sexual Activity  . Alcohol use: No    Comment: occasional wine  . Drug use: No  . Sexual activity: Yes    Partners: Male  Birth control/protection: Surgical  Other Topics Concern  . Not on file  Social History Narrative  . Not on file   Social Determinants of Health   Financial Resource Strain:   . Difficulty of Paying Living Expenses: Not on file  Food Insecurity:   . Worried About Charity fundraiser in the Last Year: Not on file  . Ran Out of Food in the Last Year: Not on file  Transportation Needs:   . Lack of Transportation (Medical): Not on file  . Lack of Transportation (Non-Medical): Not on file  Physical Activity:   . Days of Exercise per Week: Not on file  . Minutes of Exercise per Session: Not on file  Stress:   . Feeling of Stress : Not on file  Social Connections:   . Frequency of  Communication with Friends and Family: Not on file  . Frequency of Social Gatherings with Friends and Family: Not on file  . Attends Religious Services: Not on file  . Active Member of Clubs or Organizations: Not on file  . Attends Archivist Meetings: Not on file  . Marital Status: Not on file     FAMILY HISTORY:  We obtained a detailed, 4-generation family history.  Significant diagnoses are listed below: Family History  Problem Relation Age of Onset  . Breast cancer Mother 66       bilateral, BRCA reportedly neg  . Breast cancer Maternal Aunt        dx. early 87s, BRCA reportedly neg  . Breast cancer Maternal Grandmother        dx. 42s  . Bladder Cancer Maternal Grandfather 86       non-smoker  . Cervical cancer Paternal Grandmother 69  . Skin cancer Paternal Grandfather   . Brain cancer Other        paternal grandmother's sister  . Cancer Other        unknown type or age of diagnosis; maternal grandfather's brothers   [name] has three sons, ages 76, 16, and 68, and one daughter, age 39. She has one brother who is 68 and does not have children. None of these relatives have had cancer.  [name] mother was diagnosed with breast cancer in both breasts, first at age 72, and died at age 32. She had genetic testing that was normal, although [name] is unsure if her mother's genetic testing only looked at the BRCA genes, or if it looked at additional genes as well. She has one maternal aunt and two maternal uncles. Her aunt had breast cancer diagnosed in her early 53s and had negative genetic testing, although [name] believes this testing was not comprehensive of all known breast cancer genes. Her maternal grandmother had breast cancer diagnosed in her 6s, and her grandfather had bladder cancer diagnosed at age 61. She notes that her grandfather also had brothers who had cancer, although she is not sure what type of cancer they had or what age it was diagnosed. There  are no other known diagnoses of cancer on the maternal side of the family.  [name] father is currently 42 and had a pre-cancerous spot removed from his face. He has otherwise not had cancer. [name] has two paternal uncles who have not had cancer. Her paternal grandmother was recently diagnosed with cervical cancer at age 32, and she believes her paternal grandfather may have had skin cancer. Her paternal grandmother's sister died from brain cancer, although she is unsure  how old she was when it was diagnosed. There are no other known diagnoses of cancer on the paternal side of the family.   [name] is aware of previous family history of genetic testing for hereditary cancer risks in her mother and maternal aunt, although she is unsure of which genes were tested. She does not know her ancestry. There is no reported Ashkenazi Jewish ancestry. There is no known consanguinity.  GENETIC COUNSELING ASSESSMENT: [name] is a 39 y.o. female with a family history of breast cancer which is somewhat suggestive of a hereditary cancer syndrome and predisposition to cancer. We, therefore, discussed and recommended the following at today's visit.   DISCUSSION: We discussed that 5-10% of breast cancer is hereditary, with most cases associated with the BRCA genes.  There are other genes that can be associated with hereditary breast cancer syndromes.  These include ATM, CHEK2, PALB2, etc.  We discussed that testing is beneficial for several reasons including knowing about other cancer risks, identifying potential screening and risk-reduction options that may be appropriate, and to understand if other family members could be at risk for cancer and allow them to undergo genetic testing.   We reviewed the characteristics, features and inheritance patterns of hereditary cancer syndromes. We also discussed genetic testing, including the appropriate family members to test. We discussed that it will be important to  determine what genes her mother's genetic test included. If her mother's test was comprehensive (included all currently known breast cancer genes) and was normal, then genetic testing would not be clinically indicated for [name]. On the other hand, if her mother's genetic testing was not comprehensive, it would be appropriate to consider additional genetic testing. [name] will attempt to obtain a copy of her mother's genetic test report, and we will reevaluate the appropriateness of genetic testing for [name] at that time.   We also discussed the use of statistical models, such as [name] and the [employer], to determine [name]'s risk of developing breast cancer based on her personal and family history. If she is determined to be at a high risk for breast cancer, additional screening and chemoprevention options may be appropriate for her. We will run these risk models once we have a better understanding of the genetic testing that has been done in the family.  PLAN:  Ms. Mcevers will attempt to obtain a copy of her mother's genetic test report and will send it to [place]. At that time, we will reevaluate whether genetic testing for Ms. Gibbon might be appropriate. In the meantime, we recommend that she continue to follow the cancer screening guidelines given by her primary healthcare provider.  Ms. Whiting questions were answered to her satisfaction today. Our contact information was provided should additional questions or concerns arise. Thank you for the referral and allowing [place] to share in the care of your patient.   Clint Guy, MS, [facility] Certified Genetic Counselor [email or web address] Phone: [phone]  The patient was seen for a total of 30 minutes in face-to-face genetic counseling.  This patient was discussed with Drs. Magrinat, Lindi Adie and/or Burr Medico who agrees with the above.    _______________________________________________________________________ For Office Staff:    Number of people involved in session: 1 Was an Intern/ student involved with case: no

## 2019-12-14 ENCOUNTER — Telehealth: Payer: Self-pay | Admitting: Genetic Counselor

## 2019-12-14 NOTE — Telephone Encounter (Signed)
Called to check in regarding her mother's genetic test results, LVM and requested that she call back.

## 2019-12-18 ENCOUNTER — Telehealth: Payer: 59 | Admitting: Genetic Counselor

## 2019-12-24 ENCOUNTER — Telehealth: Payer: Self-pay | Admitting: Family Medicine

## 2019-12-24 ENCOUNTER — Other Ambulatory Visit: Payer: Self-pay | Admitting: Family Medicine

## 2019-12-24 NOTE — Telephone Encounter (Signed)
[facility] is requesting to fill pt adderall. Please advise [facility]

## 2019-12-24 NOTE — Telephone Encounter (Signed)
Pt called for refills of Vyvance and Adderall. Please send to [facility]. Pt can be reached at [phone].

## 2019-12-28 ENCOUNTER — Telehealth: Payer: Self-pay | Admitting: Genetic Counselor

## 2019-12-28 ENCOUNTER — Ambulatory Visit: Payer: Self-pay | Admitting: Genetic Counselor

## 2019-12-28 DIAGNOSIS — Z803 Family history of malignant neoplasm of breast: Secondary | ICD-10-CM

## 2019-12-28 NOTE — Telephone Encounter (Signed)
Spoke with Ms. Lineman about her mother's genetic test results and family history of cancer. Upon review of her mother's genetic testing, the test [facility] 25 gene panel in 2014) was comprehensive for relevant hereditary breast cancer genes. Her mother's genetic test results were normal. Therefore, we did not recommend further genetic testing for Ms. Olund at this time.   We discussed that despite the negative genetic test results, Ms. Haughton is still considered to be at a high risk for breast cancer by [name], a breast cancer risk model. Therefore, we discussed breast cancer screening recommendations and the option to be seen at the high risk breast cancer clinic. Ms. Trovato is interested in this referral.

## 2019-12-28 NOTE — Progress Notes (Signed)
HPI:  [name] was previously seen in the [facility] clinic due to a family history of breast cancer and concerns regarding a hereditary predisposition to cancer. Please refer to our prior cancer genetics clinic note for more information regarding our discussion, assessment and recommendations, at the time. We reviewed her mother's genetic test results. These results and recommendations are discussed in more detail below.  CANCER HISTORY:  Oncology History   No history exists.    FAMILY HISTORY:  We obtained a detailed, 4-generation family history.  Significant diagnoses are listed below: Family History  Problem Relation Age of Onset   Breast cancer Mother 6       bilateral, BRCA reportedly neg   Breast cancer Maternal Aunt        dx. early 65s, BRCA reportedly neg   Breast cancer Maternal Grandmother        dx. 73s   Bladder Cancer Maternal Grandfather 86       non-smoker   Cervical cancer Paternal Grandmother 61   Skin cancer Paternal Grandfather    Brain cancer Other        paternal grandmother's sister   Cancer Other        unknown type or age of diagnosis; maternal grandfather's brothers   [name] has three sons, ages 37, 81, and 2, and one daughter, age 60. She has one brother who is 37 and does not have children. None of these relatives have had cancer.  [name] mother was diagnosed with breast cancer in both breasts, first at age 23, and died at age 40. She had genetic testing that was normal. She has one maternal aunt and two maternal uncles. Her aunt had breast cancer diagnosed in her early 47s and had negative genetic testing, although [name] believes this testing was not comprehensive of all known breast cancer genes. Her maternal grandmother had breast cancer diagnosed in her 41s, and her grandfather had bladder cancer diagnosed at age 92. She notes that her grandfather also had brothers who had cancer, although she is not sure what type  of cancer they had or what age it was diagnosed. There are no other known diagnoses of cancer on the maternal side of the family.  [name] father is currently 41 and had a pre-cancerous spot removed from his face. He has otherwise not had cancer. [name] has two paternal uncles who have not had cancer. Her paternal grandmother was recently diagnosed with cervical cancer at age 24, and she believes her paternal grandfather may have had skin cancer. Her paternal grandmother's sister died from brain cancer, although she is unsure how old she was when it was diagnosed. There are no other known diagnoses of cancer on the paternal side of the family.   [name] is aware of previous family history of genetic testing for hereditary cancer risks in her mother and maternal aunt, although she is unsure of which genes were tested. She does not know her ancestry. There is no reported Ashkenazi Jewish ancestry. There is no known consanguinity.  FAMILY GENETIC TEST RESULTS: [name] mother, [name], had genetic testing in 2014 through [facility]'s 25 gene MyRisk panel. These genetic test results were normal.  The MyRisk gene panel offered by [facility] included sequencing and large rearrangement analysis of the following 25 genes: APC, ATM, BARD1, BMPR1A, BRCA1, BRCA2, BRIP1, CHD1, CDK4, CDKN2A, CHEK2, EPCAM (large rearrangement only), MLH1, MSH2, MSH6, MUTYH, NBN, PALB2, PMS2, PTEN, RAD51C, RAD51D,  SMAD4, STK11, and TP53.  Because [name]'s mother had normal genetic testing for relevant hereditary breast cancer genes, no additional genetic testing is recommended for [name] at this time.  CANCER SCREENING RECOMMENDATIONS: Her mother's test result is considered negative (normal).  This means that we have not identified a hereditary cause for Ms. Eye's family history of cancer at this time. While reassuring, this does not definitively rule out a hereditary predisposition to cancer.  It is still possible that there could be genetic mutations that are undetectable by current technology. There could also be genetic mutations in genes that have not been tested or identified to increase cancer risk. Therefore, it is recommended she continue to follow the cancer management and screening guidelines provided by her healthcare providers.   An individual's cancer risk and medical management are not determined by genetic test results alone. Overall cancer risk assessment incorporates additional factors, including personal medical history, family history, and any available genetic information that may result in a personalized plan for cancer prevention and surveillance.  Based on [name]'s personal and family history, statistical models ([name] and the [place]) were used to estimate her risk of developing breast cancer. [name] estimates her lifetime risk of developing breast cancer to be approximately 24.1%.  The patient's lifetime breast cancer risk is a preliminary estimate based on available information using one of several models endorsed by the [place] (ACS). The ACS recommends consideration of breast MRI screening as an adjunct to mammography for patients at high risk (defined as 20% or greater lifetime risk). The [name] model estimates her 5-year risk for breast cancer to be approximately 0.7%. Consideration for chemoprevention is recommended for women who have a 5-year breast cancer risk of 1.7% or greater.  [name] has been determined to be at high risk for breast cancer. We, therefore, discussed that it is reasonable for [name] to be followed by a high-risk breast cancer clinic. In addition to a yearly mammogram and physical exam by a healthcare provider, she should discuss the usefulness of an annual breast MRI with the high-risk clinic providers.   [name]:   The [place] Model:     RECOMMENDATIONS FOR FAMILY MEMBERS:  Individuals in this  family might be at some increased risk of developing cancer, over the general population risk, simply due to the family history of cancer.  We recommended women in this family have a yearly mammogram beginning 10 years younger than the earliest onset of cancer, an annual clinical breast exam, and perform monthly breast self-exams. Women in this family should also have a gynecological exam as recommended by their primary provider. All family members should have a colonoscopy by age 54.  FOLLOW-UP: Lastly, we discussed with Ms. Keidel that cancer genetics is a rapidly advancing field and it is possible that new genetic tests will be appropriate for her and/or her family members in the future. We encouraged her to remain in contact with cancer genetics on an annual basis so we can update her personal and family histories and let her know of advances in cancer genetics that may benefit this family.   Our contact number was provided. Ms. Dedmon questions were answered to her satisfaction, and she knows she is welcome to call us at anytime with additional questions or concerns.   Clint Guy, MS, [facility] Genetic Counselor [email or web address] Phone: [phone]

## 2019-12-28 NOTE — Progress Notes (Signed)
Screenshots of breast cancer risk models did not save in original signed note. Here they are again:  Harriett Rush:   [facility]:

## 2020-01-07 NOTE — Progress Notes (Signed)
[facility] [address] [place]. [place] [address] [phone]       VIRTUAL VISIT FOLLOW UP NOTE  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 Follow-up    3 mon f/u. Alone. Treatment room. No new concerns at this time.     HPI:  Stroke admission 01/04/2019: [name] a 39 y.o.femalepast medical history of ADD and panic attacks, who presented to [facility] ED for evaluation of an episode of panic attack. According to the husband, she had been complaining of days to weeks of right-sided neck pain. No other focal deficits were noted by the family. The morning of admission, she woke up with a bad headache and neck pain along with feelings of anxiety. When attempts were made in the ED to have her ambulate, she was very wobbly and ataxic. That evening, a CT head obtained due to lethargy and ongoing complaints of dizziness. CT head reviewed and showed a evolving acute ischemic right cerebellar and bilateral PCA territory infarcts with involvement of bilateral occipital lobes and thalami along with trace petechial hemorrhage at the occipital poles without frank hemorrhagic transformation. There was associated mass-effect within the right posterior fossa with partial effacement of the right basilar cisterns and fourth ventricle but no obstructive hydrocephalus at the time. CTA head and neck showed acute right vertebral artery dissection involving the right V3 segment at the level of the right C1 transverse foramen with probable small dissection flap just distally at the right V3 V4 junction with associated moderate stenosis. There is also probable distal bilateral P3 P4 occlusions possibly thromboembolic in nature. No stenosis or occlusions in the anterior circulation. Denies any neck trauma,  manipulation or accidents. MRI brain reviewed and showed multifocal right cerebellar infarction relfecting R VA (B thalamic, left temporal lobe and right occipital lobe reflecting thalamoperforate and B PCA) with significant mass effect on fourth ventricle without hydrocephalus. 2D echo showed EF of 60-65% without cardiac source of embolus.  LDL 88 and A1c 5.7. Other stroke risk factors include obesity but no prior history of stroke. She was discharged to CIR in stable condition on 01/03/19 and discharged home on 01/16/19 with outpatient therapy recommendations.   Virtual visit 02/22/2019: She reports since being home she has been doing well with continued residual deficits of right hemiparesis and visual deficits. She has returned back to all prior activites but does need moderate assistance with cooking due to right arm weakness. She was participating in therapy thru neuro rehab but due to pandemic, this has been postponed.  She has a difficult time explaining her visual concerns but per what she reports, appears to be diplopia right eye greater than left eye.  Does not appear to have difficulties with peripheral vision.  She states that when she attempts to read something or watch TV, she will close her right eye and is able to see much clear throughout her left.  She plans on calling neuro-ophthalmology Dr. Frederico Hamman which was recommended during CIR.  She does endorse occasional dizziness only when she tilts her head up but will resolve once in the neutral position.  She denies any other periods of dizziness or balance difficulties.  She denies neck pain or headaches. She continues on aspirin 368m daily without side effects of bleeding or bruising.  She  does monitor BP at home and was recently started on hydrochlorothiazide by her PCP on 02/05/2019 with BP typically ranging 130s/90s.  She did check BP during visit which resulted in 141/99 and heart rate 87.  She does endorses being typically higher than what she has  been obtaining prior and feels as though it is due to increased anxiety with virtual visit along with attempting to care for her 2 children during the visit as her husband is currently working.  No further concerns at this time.  Denies new or worsening stroke/TIA symptoms.  Update 09/01/2019 (virtual visit): [name] is being seen today for stroke follow-up and clearance to undergo surgical eye procedure with Dr. Frederico Hamman [facility]).  Residual deficits of occasional dizziness and mild decreased RUE coordination/ataxia but improvement. Continues to have diplopia but optho believes due to right eye higher than left  Plans on undergoing procedure that will correct muscles of right eye due to ongoing diplopia. At prior visit, recommended continuation of aspirin 325 mg daily but she has discontinued this approximately 3 months ago as she did not think it was necessary to continue.  No repeat imaging has been done at this time due to prior visit imaging limited due to emergent needs only due to pandemic restrictions.  It was recommended to follow-up in 4 months but unfortunately that did not happen.  Blood pressure check during virtual visit at 131/86.  Typically SBP 120-130.  Continues on valsartan-hydrochlorothiazide daily.  No further concerns at this time.   Update 01/08/2020: [name] is a 39 year old female who is being seen today for stroke follow-up.  Residual deficits of occasional dizziness sensation while laying down and hyperextending neck but otherwise denies any type of vertigo dizziness symptoms.  Vision has greatly improved since undergoing eye procedure with only occasional symptoms of diplopia but otherwise doing great.  Repeat CTA head/neck showed resolution of prior dissection and recanalized right V3 segment with normal caliber but did show continued 50% right P2 segment narrowing due to uncovertebral spur.  Continues on aspirin 325 mg daily without bleeding or bruising.  Blood pressure  today 118/78. No further concerns at this time.     ROS:   14 system review of systems performed and negative with exception of occasional diplopia and dizziness  PMH:  Past Medical History:  Diagnosis Date  . ADD (attention deficit disorder)   . Exercise-induced asthma   . Family history of bladder cancer   . Family history of brain cancer   . Family history of breast cancer   . Family history of cervical cancer   . Family history of skin cancer   . Hx of varicella   . Kidney stones   . S/P cesarean section 05/05/2016  . S/P tubal ligation 05/05/2016  . Stroke ([place])   . Term pregnancy, repeat 05/04/2016    PSH:  Past Surgical History:  Procedure Laterality Date  . BACK SURGERY     scoliosis age 35  . BILATERAL SALPINGECTOMY Bilateral 05/05/2016   Procedure: BILATERAL SALPINGECTOMY;  Surgeon: Janyth Contes, MD;  Location: [place];  Service: Obstetrics;  Laterality: Bilateral;  . CESAREAN SECTION    . CESAREAN SECTION N/A 05/05/2016   Procedure: CESAREAN SECTION;  Surgeon: Janyth Contes, MD;  Location: [place];  Service: Obstetrics;  Laterality: N/A;  . CHOLECYSTECTOMY    . HAND SURGERY    . HERNIA REPAIR    . KIDNEY STONE SURGERY     R--surgery; L  lithotripsy.  Dr. Gaynelle Arabian    Social History:  Social History   Socioeconomic History  . Marital status: Married    Spouse name: Not on file  . Number of children: Not on file  . Years of education: Not on file  . Highest education level: Not on file  Occupational History  . Not on file  Tobacco Use  . Smoking status: Never Smoker  . Smokeless tobacco: Never Used  Substance and Sexual Activity  . Alcohol use: No    Comment: occasional wine  . Drug use: No  . Sexual activity: Yes    Partners: Male    Birth control/protection: Surgical  Other Topics Concern  . Not on file  Social History Narrative  . Not on file   Social Determinants of Health   Financial Resource Strain:     . Difficulty of Paying Living Expenses: Not on file  Food Insecurity:   . Worried About Charity fundraiser in the Last Year: Not on file  . Ran Out of Food in the Last Year: Not on file  Transportation Needs:   . Lack of Transportation (Medical): Not on file  . Lack of Transportation (Non-Medical): Not on file  Physical Activity:   . Days of Exercise per Week: Not on file  . Minutes of Exercise per Session: Not on file  Stress:   . Feeling of Stress : Not on file  Social Connections:   . Frequency of Communication with Friends and Family: Not on file  . Frequency of Social Gatherings with Friends and Family: Not on file  . Attends Religious Services: Not on file  . Active Member of Clubs or Organizations: Not on file  . Attends Archivist Meetings: Not on file  . Marital Status: Not on file  Intimate Partner Violence:   . Fear of Current or Ex-Partner: Not on file  . Emotionally Abused: Not on file  . Physically Abused: Not on file  . Sexually Abused: Not on file    Family History:  Family History  Problem Relation Age of Onset  . Breast cancer Mother 30       bilateral, BRCA reportedly neg  . Breast cancer Maternal Aunt        dx. early 72s, BRCA reportedly neg  . Breast cancer Maternal Grandmother        dx. 36s  . Bladder Cancer Maternal Grandfather 86       non-smoker  . Cervical cancer Paternal Grandmother 52  . Skin cancer Paternal Grandfather   . Brain cancer Other        paternal grandmother's sister  . Cancer Other        unknown type or age of diagnosis; maternal grandfather's brothers    Medications:   Current Outpatient Medications on File Prior to Visit  Medication Sig Dispense Refill  . amphetamine-dextroamphetamine (ADDERALL) 10 MG tablet TAKE 1 TABLET BY MOUTH DAILY AFTER LUNCH 30 tablet 0  . lisdexamfetamine (VYVANSE) 50 MG capsule Take 1 capsule (50 mg total) by mouth daily. 30 capsule 0  . losartan-hydrochlorothiazide (HYZAAR) 50-12.5  MG tablet TAKE 1 TABLET BY MOUTH EVERY DAY 90 tablet 0  . sertraline (ZOLOFT) 100 MG tablet TAKE 1 TABLET (100 MG TOTAL) BY MOUTH AT BEDTIME. 90 tablet 1   No current facility-administered medications on file prior to visit.    Allergies:  No Known Allergies   Physical Exam  Vitals:   01/08/20 0732  BP: 118/78  Pulse: 79  Temp: (!) 97.4 F (36.3 C)  TempSrc: Oral  Weight: 162 lb (73.5 kg)  Height: '5\' 4"'  (1.626 m)    General: well developed, well nourished, very pleasant middle-age Caucasian female, seated, in no evident distress Head: head normocephalic and atraumatic.   Neck: supple with no carotid or supraclavicular bruits Cardiovascular: regular rate and rhythm, no murmurs Musculoskeletal: no deformity Skin:  no rash/petichiae Vascular:  Normal pulses all extremities   Neurologic Exam Mental Status: Awake and fully alert.   Normal speech and language.  Oriented to place and time. Recent and remote memory intact. Attention span, concentration and fund of knowledge appropriate. Mood and affect appropriate.  Cranial Nerves: Pupils equal, briskly reactive to light. Extraocular movements show saccadic dysmetria with 3-4 beat nystagmus looking towards left -stable since prior visit. Visual fields full to confrontation. Hearing intact. Facial sensation intact. Face, tongue, palate moves normally and symmetrically.  Motor: Normal bulk and tone. Normal strength in all tested extremity muscles. Sensory.: intact to touch , pinprick , position and vibratory sensation.  Coordination: Rapid alternating movements normal in all extremities. Finger-to-nose and heel-to-shin performed accurately bilaterally. Gait and Station: Arises from chair without difficulty. Stance is normal. Gait demonstrates normal stride length and balance Reflexes: 1+ and symmetric. Toes downgoing.        Diagnostic Data (Labs, Imaging, Testing)  Ct Angio Head W Or Wo Contrast 01/02/2019 IMPRESSION:    IMPRESSION: 1. Recanalized right V3 segment with normal caliber and no residual signs of prior dissection. 2. 50% right V2 segment narrowing due to uncovertebral spur.  CT HEAD  IMPRESSION  1. 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 Associated trace petechial hemorrhage at the occipital poles without frank hemorrhagic transformation.  2. Associated mass effect within the right posterior fossa with partial effacement of the right basilar cisterns and fourth ventricle. No obstructive hydrocephalus at this time.   CTA HEAD AND NECK  IMPRESSION  1. Acute right vertebral artery dissection, involving the right V3 segment at the level of the right C1 transverse foramen, with probable small dissection flap just distally at the right V3/V4 junction. Associated moderate stenosis.  2. 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  3. Otherwise negative   CTA of the head and neck.  Normal left vertebral artery. 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    Ct Head Wo Contrast 01/05/2019 IMPRESSION:  1. 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  2. Otherwise expected evolution of thalamic and bilateral occipital lobe infarcts without acute hemorrhage.   Ct Head Wo Contrast 01/03/2019 IMPRESSION:  1. Unchanged appearance of posterior circulation infarcts involving the right cerebellum, both thalami and both occipital lobes.  2. Unchanged posterior fossa edema with narrowing of the basal cisterns and fourth ventricle without hydrocephalus.     Mr Brain Wo Contrast 01/03/2019 IMPRESSION:  Multifocal areas of acute infarction, predominantly affecting the RIGHT cerebellum  reflecting RIGHT vertebral dissection, but also areas of acute infarction elsewhere in the posterior circulation, notably both thalami, LEFT temporal lobe, and RIGHT occipital lobe reflecting thalamoperforate and BILATERAL PCA territory ischemia, consistent with distal emboli. Abnormal flow void RIGHT vertebral reflecting the previously identified dissection. No features to suggest hemorrhagic transformation. Significant mass effect on the outlet foramina the  fourth ventricle without current features of hydrocephalus. Close continued surveillance is warranted.    ASSESSMENT: [name] is a 39 y.o. 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 Vascular risk factors include obesity.  Recovered well with only occasional dizziness sensation with hyperextension of neck.  She did undergo ophthalmology procedure with improvement of diplopia.  Repeat CTA showed resolution of prior dissection.    PLAN:  1. R PCA infarct : Recommend lowering aspirin dosage to aspirin 81 mg daily for secondary stroke prevention as no indication at this time for higher dose.  Advised patient that this will be a lifelong medication for secondary stroke prevention.  Maintain strict control of hypertension with blood pressure goal normotensive range, diabetes with hemoglobin A1c goal below 6.5% and cholesterol with LDL cholesterol (bad cholesterol) goal below 70 mg/dL.  I also advised the patient to eat a healthy diet with plenty of whole grains, cereals, fruits and vegetables, exercise regularly with at least 30 minutes of continuous activity daily and maintain ideal body weight. 2. Right VA dissection: Complete resolution on recent imaging -continue aspirin lifelong 3. Recent imaging showed 50% right V2 segment narrowing due to uncovertebral spur -we will repeat CTA neck at follow-up visit to ensure stable appearance.  She remains asymptomatic at  this time    Follow up in 6 months or call earlier if needed   Greater than 50% of time during this 20 minute visit was spent on counseling, explanation of diagnosis of right PCA infarct, reviewing and discussing recent CTA, planning of further management along with potential future management, and answered all questions to patient satisfaction    Frann Rider, [facility]  [facility] [address] [place], [place] [address]  Phone [phone] Fax [phone] Note: This document was prepared with digital dictation and possible smart phrase technology. Any transcriptional errors that result from this process are unintentional.

## 2020-01-08 ENCOUNTER — Encounter: Payer: Self-pay | Admitting: Adult Health

## 2020-01-08 ENCOUNTER — Ambulatory Visit (INDEPENDENT_AMBULATORY_CARE_PROVIDER_SITE_OTHER): Payer: 59 | Admitting: Adult Health

## 2020-01-08 ENCOUNTER — Other Ambulatory Visit: Payer: Self-pay

## 2020-01-08 VITALS — BP 118/78 | HR 79 | Temp 97.4°F | Ht 64.0 in | Wt 162.0 lb

## 2020-01-08 DIAGNOSIS — I63331 Cerebral infarction due to thrombosis of right posterior cerebral artery: Secondary | ICD-10-CM

## 2020-01-08 DIAGNOSIS — H5509 Other forms of nystagmus: Secondary | ICD-10-CM | POA: Diagnosis not present

## 2020-01-08 DIAGNOSIS — Z8679 Personal history of other diseases of the circulatory system: Secondary | ICD-10-CM

## 2020-01-08 MED ORDER — ASPIRIN EC 81 MG PO TBEC: 81.0000 mg | DELAYED_RELEASE_TABLET | Freq: Every day | ORAL | Status: AC

## 2020-01-08 NOTE — Patient Instructions (Signed)
Decrease aspirin dose to 81mg  daily for secondary stroke prevention  Continue to monitor blood pressure at home  Maintain strict control of hypertension with blood pressure goal below 130/90, diabetes with hemoglobin A1c goal below 6.5% and cholesterol with LDL cholesterol (bad cholesterol) goal below 70 mg/dL. I also advised the patient to eat a healthy diet with plenty of whole grains, cereals, fruits and vegetables, exercise regularly and maintain ideal body weight.  Followup in the future with me in 6 months or call earlier if needed       Thank you for coming to see [place] at [facility]. I hope we have been able to provide you high quality care today.  You may receive a patient satisfaction survey over the next few weeks. We would appreciate your feedback and comments so that we may continue to improve ourselves and the health of our patients.

## 2020-01-10 NOTE — Progress Notes (Signed)
I agree with the above plan 

## 2020-01-11 ENCOUNTER — Telehealth: Payer: Self-pay | Admitting: Family Medicine

## 2020-01-11 DIAGNOSIS — F902 Attention-deficit hyperactivity disorder, combined type: Secondary | ICD-10-CM

## 2020-01-11 MED ORDER — LISDEXAMFETAMINE DIMESYLATE 50 MG PO CAPS: 50.0000 mg | ORAL_CAPSULE | Freq: Every day | ORAL | 0 refills | Status: DC

## 2020-01-11 NOTE — Telephone Encounter (Signed)
Pt needs refill on Vyvanse sent to [facility] drug

## 2020-01-14 ENCOUNTER — Telehealth: Payer: Self-pay | Admitting: Hematology and Oncology

## 2020-01-14 NOTE — Telephone Encounter (Signed)
Received a msg from [place] for Ms. Barua to be seen in the high risk breast clinic. Pt has been scheduled to see Dr. Lindi Adie on 3/22 at 1pm.

## 2020-01-21 ENCOUNTER — Telehealth: Payer: Self-pay | Admitting: Family Medicine

## 2020-01-21 MED ORDER — AMPHETAMINE-DEXTROAMPHETAMINE 10 MG PO TABS: ORAL_TABLET | ORAL | 0 refills | Status: DC

## 2020-01-21 MED ORDER — AMPHETAMINE-DEXTROAMPHETAMINE 10 MG PO TABS: 10.0000 mg | ORAL_TABLET | Freq: Every day | ORAL | 0 refills | Status: DC

## 2020-01-21 NOTE — Telephone Encounter (Signed)
Pt needs refill on Adderall 10mg  sent to [facility]

## 2020-02-10 NOTE — Progress Notes (Signed)
[facility] CONSULT NOTE  Patient Care Team: Denita Lung, MD as PCP - General (Family Medicine)  CHIEF COMPLAINTS/PURPOSE OF CONSULTATION:  Newly diagnosed high risk for breast cancer  HISTORY OF PRESENTING ILLNESS:  [name] 39 y.o. female is here because of recent diagnosis of high risk for breast cancer. She has a family history of breast cancer in her mother, maternal aunt, and maternal grandmother. She presents to the clinic today for initial evaluation and discussion of surveillance options.  She has extensive family history of breast cancer in her mother, grandmother and her grandmother sister.  She went to genetic counseling and was recommended not to do genetics because of her mother's negative genetics.  She was referred to [place] to talk about surveillance of high risk breast cancer patient.  I reviewed her records extensively and collaborated the history with the patient.  SUMMARY OF ONCOLOGIC HISTORY: Oncology History   No history exists.    MEDICAL HISTORY:  Past Medical History:  Diagnosis Date  . ADD (attention deficit disorder)   . Exercise-induced asthma   . Family history of bladder cancer   . Family history of brain cancer   . Family history of breast cancer   . Family history of cervical cancer   . Family history of skin cancer   . Hx of varicella   . Kidney stones   . S/P cesarean section 05/05/2016  . S/P tubal ligation 05/05/2016  . Stroke ([place])   . Term pregnancy, repeat 05/04/2016    SURGICAL HISTORY: Past Surgical History:  Procedure Laterality Date  . BACK SURGERY     scoliosis age 32  . BILATERAL SALPINGECTOMY Bilateral 05/05/2016   Procedure: BILATERAL SALPINGECTOMY;  Surgeon: Janyth Contes, MD;  Location: [facility];  Service: Obstetrics;  Laterality: Bilateral;  . CESAREAN SECTION    . CESAREAN SECTION N/A 05/05/2016   Procedure: CESAREAN SECTION;  Surgeon: Janyth Contes, MD;  Location: [facility];  Service: Obstetrics;  Laterality: N/A;  . CHOLECYSTECTOMY    . HAND SURGERY    . HERNIA REPAIR    . KIDNEY STONE SURGERY     R--surgery; L lithotripsy.  Dr. Gaynelle Arabian    SOCIAL HISTORY: Social History   Socioeconomic History  . Marital status: Married    Spouse name: Not on file  . Number of children: Not on file  . Years of education: Not on file  . Highest education level: Not on file  Occupational History  . Not on file  Tobacco Use  . Smoking status: Never Smoker  . Smokeless tobacco: Never Used  Substance and Sexual Activity  . Alcohol use: No    Comment: occasional wine  . Drug use: No  . Sexual activity: Yes    Partners: Male    Birth control/protection: Surgical  Other Topics Concern  . Not on file  Social History Narrative  . Not on file   Social Determinants of Health   Financial Resource Strain:   . Difficulty of Paying Living Expenses:   Food Insecurity:   . Worried About Charity fundraiser in the Last Year:   . Arboriculturist in the Last Year:   Transportation Needs:   . Film/video editor (Medical):   Marland Kitchen Lack of Transportation (Non-Medical):   Physical Activity:   . Days of Exercise per Week:   . Minutes of Exercise per Session:   Stress:   . Feeling of Stress :  Social Connections:   . Frequency of Communication with Friends and Family:   . Frequency of Social Gatherings with Friends and Family:   . Attends Religious Services:   . Active Member of Clubs or Organizations:   . Attends Archivist Meetings:   Marland Kitchen Marital Status:   Intimate Partner Violence:   . Fear of Current or Ex-Partner:   . Emotionally Abused:   Marland Kitchen Physically Abused:   . Sexually Abused:     FAMILY HISTORY: Family History  Problem Relation Age of Onset  . Breast cancer Mother 1       bilateral, BRCA reportedly neg  . Breast cancer Maternal Aunt        dx. early 14s, BRCA reportedly neg  . Breast cancer Maternal Grandmother        dx. 40s    . Bladder Cancer Maternal Grandfather 86       non-smoker  . Cervical cancer Paternal Grandmother 58  . Skin cancer Paternal Grandfather   . Brain cancer Other        paternal grandmother's sister  . Cancer Other        unknown type or age of diagnosis; maternal grandfather's brothers    ALLERGIES:  has No Known Allergies.  MEDICATIONS:  Current Outpatient Medications  Medication Sig Dispense Refill  . amphetamine-dextroamphetamine (ADDERALL) 10 MG tablet TAKE 1 TABLET BY MOUTH DAILY AFTER LUNCH 30 tablet 0  . [START ON 02/21/2020] amphetamine-dextroamphetamine (ADDERALL) 10 MG tablet Take 1 tablet (10 mg total) by mouth daily after lunch. 30 tablet 0  . aspirin EC 81 MG tablet Take 1 tablet (81 mg total) by mouth daily.    Marland Kitchen lisdexamfetamine (VYVANSE) 50 MG capsule Take 1 capsule (50 mg total) by mouth daily. 30 capsule 0  . lisdexamfetamine (VYVANSE) 50 MG capsule Take 1 capsule (50 mg total) by mouth daily. 30 capsule 0  . [START ON 03/11/2020] lisdexamfetamine (VYVANSE) 50 MG capsule Take 1 capsule (50 mg total) by mouth daily. 30 capsule 0  . losartan-hydrochlorothiazide (HYZAAR) 50-12.5 MG tablet TAKE 1 TABLET BY MOUTH EVERY DAY 90 tablet 0  . sertraline (ZOLOFT) 100 MG tablet TAKE 1 TABLET (100 MG TOTAL) BY MOUTH AT BEDTIME. 90 tablet 1   No current facility-administered medications for this visit.    REVIEW OF SYSTEMS:   Constitutional: Denies fevers, chills or abnormal night sweats Eyes: Denies blurriness of vision, double vision or watery eyes Ears, nose, mouth, throat, and face: Denies mucositis or sore throat Respiratory: Denies cough, dyspnea or wheezes Cardiovascular: Denies palpitation, chest discomfort or lower extremity swelling Gastrointestinal:  Denies nausea, heartburn or change in bowel habits Skin: Denies abnormal skin rashes Lymphatics: Denies new lymphadenopathy or easy bruising Neurological:Denies numbness, tingling or new weaknesses Behavioral/Psych: Mood  is stable, no new changes  Breast: Denies any palpable lumps or discharge All other systems were reviewed with the patient and are negative.  PHYSICAL EXAMINATION: ECOG PERFORMANCE STATUS: 0 - Asymptomatic  Vitals:   02/11/20 1324  BP: 130/88  Pulse: 86  Resp: 20  Temp: 98.2 F (36.8 C)  SpO2: 99%   Filed Weights   02/11/20 1324  Weight: 161 lb 1.6 oz (73.1 kg)    GENERAL:alert, no distress and comfortable SKIN: skin color, texture, turgor are normal, no rashes or significant lesions EYES: normal, conjunctiva are pink and non-injected, sclera clear OROPHARYNX:no exudate, no erythema and lips, buccal mucosa, and tongue normal  NECK: supple, thyroid normal size, non-tender, without nodularity LYMPH:  no palpable lymphadenopathy in the cervical, axillary or inguinal LUNGS: clear to auscultation and percussion with normal breathing effort HEART: regular rate & rhythm and no murmurs and no lower extremity edema ABDOMEN:abdomen soft, non-tender and normal bowel sounds Musculoskeletal:no cyanosis of digits and no clubbing  PSYCH: alert & oriented x 3 with fluent speech NEURO: no focal motor/sensory deficits  LABORATORY DATA:  I have reviewed the data as listed Lab Results  Component Value Date   WBC 10.6 (H) 01/08/2019   HGB 14.3 01/08/2019   HCT 42.9 01/08/2019   MCV 86.7 01/08/2019   PLT 349 01/08/2019   Lab Results  Component Value Date   NA 140 01/08/2019   K 3.7 01/08/2019   CL 104 01/08/2019   CO2 28 01/08/2019    RADIOGRAPHIC STUDIES: I have personally reviewed the radiological reports and agreed with the findings in the report.  ASSESSMENT AND PLAN:  At high risk for breast cancer [name] estimates her lifetime risk of developing breast cancer to be approximately 24.1%. [name] model estimates her 5-year risk for breast cancer to be approximately 0.7% Mother had breast cancer and also had genetic testing through [facility]: Negative for mutations Maternal  aunt and maternal grandmother also had breast cancers. Ms. Adami did not require genetic testing based on her mother's test results.  Counseling regarding breast cancer screening: 1.  Given the lifetime risk of greater than 20%, she qualifies for breast MRI annually along with mammograms. 2.  No role of antiestrogen therapy because her 5-year risk of breast cancer by [name] model is 0.7%.  Return to clinic annually for follow-ups.   I discussed with her that if her primary care physician begins to order these images then we can be seen on an as-needed basis.  For the time being I will make an appointment for her in 1 year for follow-up.   All questions were answered. The patient knows to call the clinic with any problems, questions or concerns.   Rulon Eisenmenger, MD, MPH 02/11/2020    I, Molly Dorshimer, am acting as scribe for Nicholas Lose, MD.  I have reviewed the above documentation for accuracy and completeness, and I agree with the above.

## 2020-02-11 ENCOUNTER — Other Ambulatory Visit: Payer: Self-pay

## 2020-02-11 ENCOUNTER — Inpatient Hospital Stay: Payer: 59 | Attending: Hematology and Oncology | Admitting: Hematology and Oncology

## 2020-02-11 DIAGNOSIS — Z8052 Family history of malignant neoplasm of bladder: Secondary | ICD-10-CM | POA: Diagnosis not present

## 2020-02-11 DIAGNOSIS — Z9189 Other specified personal risk factors, not elsewhere classified: Secondary | ICD-10-CM | POA: Insufficient documentation

## 2020-02-11 DIAGNOSIS — Z808 Family history of malignant neoplasm of other organs or systems: Secondary | ICD-10-CM

## 2020-02-11 DIAGNOSIS — Z803 Family history of malignant neoplasm of breast: Secondary | ICD-10-CM | POA: Diagnosis not present

## 2020-02-11 NOTE — Assessment & Plan Note (Signed)
[name] estimates her lifetime risk of developing breast cancer to be approximately 24.1%. [name] model estimates her 5-year risk for breast cancer to be approximately 0.7% Mother had breast cancer and also had genetic testing through [facility]: Negative for mutations Maternal aunt and maternal grandmother also had breast cancers. Ms. Sine did not require genetic testing based on her mother's test results.  Counseling regarding breast cancer screening: 1.  Given the lifetime risk of greater than 20%, she qualifies for breast MRI annually along with mammograms. 2.  No role of antiestrogen therapy because her 5-year risk of breast cancer by [name] model is 0.7%.  Return to clinic annually for follow-ups.  We will set her up with Mendel Ryder.

## 2020-02-12 ENCOUNTER — Other Ambulatory Visit: Payer: Self-pay

## 2020-02-12 DIAGNOSIS — Z9189 Other specified personal risk factors, not elsewhere classified: Secondary | ICD-10-CM

## 2020-02-14 ENCOUNTER — Telehealth: Payer: Self-pay | Admitting: Hematology and Oncology

## 2020-02-14 NOTE — Telephone Encounter (Signed)
Scheduled per 03/22 los, patient has been called and notified. ?

## 2020-02-22 ENCOUNTER — Ambulatory Visit
Admission: RE | Admit: 2020-02-22 | Discharge: 2020-02-22 | Disposition: A | Discharge status: Home / Self Care | Payer: 59 | Source: Ambulatory Visit | Attending: Hematology and Oncology | Admitting: Hematology and Oncology

## 2020-02-22 ENCOUNTER — Other Ambulatory Visit: Payer: Self-pay | Admitting: Hematology and Oncology

## 2020-02-22 ENCOUNTER — Other Ambulatory Visit: Payer: Self-pay

## 2020-02-22 DIAGNOSIS — Z1231 Encounter for screening mammogram for malignant neoplasm of breast: Secondary | ICD-10-CM

## 2020-02-22 DIAGNOSIS — Z9189 Other specified personal risk factors, not elsewhere classified: Secondary | ICD-10-CM

## 2020-02-25 ENCOUNTER — Other Ambulatory Visit: Payer: Self-pay | Admitting: Family Medicine

## 2020-02-25 ENCOUNTER — Other Ambulatory Visit: Payer: Self-pay | Admitting: Hematology and Oncology

## 2020-02-25 DIAGNOSIS — I10 Essential (primary) hypertension: Secondary | ICD-10-CM

## 2020-02-25 DIAGNOSIS — R928 Other abnormal and inconclusive findings on diagnostic imaging of breast: Secondary | ICD-10-CM

## 2020-03-06 ENCOUNTER — Telehealth: Payer: Self-pay | Admitting: Family Medicine

## 2020-03-06 NOTE — Telephone Encounter (Signed)
Call and okay to give it to her early.

## 2020-03-06 NOTE — Telephone Encounter (Signed)
I called [facility] and gave the ok to fill her Vyvanse early per your request.

## 2020-03-06 NOTE — Telephone Encounter (Signed)
Pt called and is requesting that her vyvanse be ok to be filled early states because that she will run out because the month was longer and she would like to be able to get it before sat because the pharmacy is not open on Sunday  Pt uses [facility], [place]

## 2020-03-11 ENCOUNTER — Other Ambulatory Visit: Payer: Self-pay

## 2020-03-11 ENCOUNTER — Ambulatory Visit
Admission: RE | Admit: 2020-03-11 | Discharge: 2020-03-11 | Disposition: A | Discharge status: Home / Self Care | Payer: 59 | Source: Ambulatory Visit | Attending: Hematology and Oncology | Admitting: Hematology and Oncology

## 2020-03-11 DIAGNOSIS — R928 Other abnormal and inconclusive findings on diagnostic imaging of breast: Secondary | ICD-10-CM

## 2020-03-17 ENCOUNTER — Telehealth: Payer: Self-pay | Admitting: Family Medicine

## 2020-03-17 NOTE — Telephone Encounter (Signed)
Called [facility] and she last filled her Adderall 10mg  on 02/21/20 for #30.

## 2020-03-17 NOTE — Telephone Encounter (Signed)
Find out when she last filled the Adderall.

## 2020-03-17 NOTE — Telephone Encounter (Signed)
Pt called and is requesting a refill on her adderall pt states she is out and needs it refilled for today. Pt states because the way the month ran she is out, pt would like it sent to the [facility], [place]

## 2020-03-18 MED ORDER — AMPHETAMINE-DEXTROAMPHETAMINE 10 MG PO TABS: ORAL_TABLET | ORAL | 0 refills | Status: DC

## 2020-03-18 NOTE — Telephone Encounter (Signed)
Pt called and wanted to see if she could possibly get this medication a few days early since she is completely out

## 2020-03-18 NOTE — Telephone Encounter (Signed)
Pt states she called her pharmacy and medication can be filled on 03/20/2020. Please fill adderall on 03/20/2020

## 2020-04-03 ENCOUNTER — Telehealth: Payer: Self-pay | Admitting: Family Medicine

## 2020-04-03 DIAGNOSIS — F902 Attention-deficit hyperactivity disorder, combined type: Secondary | ICD-10-CM

## 2020-04-03 MED ORDER — LISDEXAMFETAMINE DIMESYLATE 50 MG PO CAPS: 50.0000 mg | ORAL_CAPSULE | Freq: Every day | ORAL | 0 refills | Status: DC

## 2020-04-03 NOTE — Telephone Encounter (Signed)
Pt called for refills of Vyvance. Please send to [facility] on [address]

## 2020-04-14 ENCOUNTER — Telehealth: Payer: Self-pay | Admitting: Family Medicine

## 2020-04-14 MED ORDER — AMPHETAMINE-DEXTROAMPHETAMINE 10 MG PO TABS: ORAL_TABLET | ORAL | 0 refills | Status: DC

## 2020-04-14 NOTE — Telephone Encounter (Signed)
Pt called and is requesting a refill on her adderall 10 mg please send to the [facility], [place]

## 2020-05-02 ENCOUNTER — Telehealth: Payer: Self-pay | Admitting: Family Medicine

## 2020-05-02 DIAGNOSIS — F902 Attention-deficit hyperactivity disorder, combined type: Secondary | ICD-10-CM

## 2020-05-02 MED ORDER — LISDEXAMFETAMINE DIMESYLATE 50 MG PO CAPS: 50.0000 mg | ORAL_CAPSULE | Freq: Every day | ORAL | 0 refills | Status: DC

## 2020-05-02 NOTE — Telephone Encounter (Signed)
Tell her that we have to keep a diet that to maintain compliance issues.

## 2020-05-02 NOTE — Telephone Encounter (Signed)
Pt wants too know if it could be sent into [facility] on [address] because the pharmacy is open on sundays and where the script is now they are not open. [place]

## 2020-05-02 NOTE — Telephone Encounter (Signed)
Pt called and wanted to see if she could get her prescription for Vyvanse moved to the 12th instead of the 13th because she will be out of the medication by then

## 2020-05-02 NOTE — Telephone Encounter (Signed)
Vyvanse was called into a different drugstore with better hours

## 2020-05-12 ENCOUNTER — Other Ambulatory Visit: Payer: Self-pay | Admitting: Family Medicine

## 2020-05-12 NOTE — Telephone Encounter (Signed)
[facility] is requesting to fill pt adderral. Please advise. [place]

## 2020-05-30 ENCOUNTER — Telehealth: Payer: Self-pay | Admitting: Family Medicine

## 2020-05-30 NOTE — Telephone Encounter (Signed)
Pt called and wanted to see if she can get her prescription for Vyvanse changed to be picked up today instead of on the 13th. Pt advised that since this is a controlled that it may not be able to be switched.

## 2020-05-30 NOTE — Telephone Encounter (Signed)
Pt called to check on Vyvanse rx being sent in earlier Advised her per note from Dr Redmond School  He could not change date on rx

## 2020-05-30 NOTE — Telephone Encounter (Signed)
Let her know that we cannot do that.

## 2020-06-16 ENCOUNTER — Telehealth: Payer: Self-pay | Admitting: Family Medicine

## 2020-06-16 MED ORDER — AMPHETAMINE-DEXTROAMPHETAMINE 10 MG PO TABS: 10.0000 mg | ORAL_TABLET | Freq: Every day | ORAL | 0 refills | Status: DC

## 2020-06-16 NOTE — Telephone Encounter (Signed)
Pt called for refills of Adderall. Please send to [facility]. Pt can be reached at [phone]

## 2020-06-27 ENCOUNTER — Other Ambulatory Visit: Payer: Self-pay | Admitting: Family Medicine

## 2020-06-27 DIAGNOSIS — I10 Essential (primary) hypertension: Secondary | ICD-10-CM

## 2020-06-30 ENCOUNTER — Telehealth: Payer: Self-pay

## 2020-06-30 DIAGNOSIS — F902 Attention-deficit hyperactivity disorder, combined type: Secondary | ICD-10-CM

## 2020-06-30 MED ORDER — LISDEXAMFETAMINE DIMESYLATE 50 MG PO CAPS: 50.0000 mg | ORAL_CAPSULE | Freq: Every day | ORAL | 0 refills | Status: DC

## 2020-06-30 NOTE — Telephone Encounter (Signed)
Pt. Called stating that she needs a refill on her Vyvanse sent in to the [facility] and pt. Last last apt. Was 11/06/19.

## 2020-07-08 ENCOUNTER — Encounter: Payer: Self-pay | Admitting: Adult Health

## 2020-07-08 ENCOUNTER — Ambulatory Visit (INDEPENDENT_AMBULATORY_CARE_PROVIDER_SITE_OTHER): Payer: 59 | Admitting: Adult Health

## 2020-07-08 VITALS — BP 121/82 | HR 77 | Ht 64.0 in | Wt 162.4 lb

## 2020-07-08 DIAGNOSIS — R269 Unspecified abnormalities of gait and mobility: Secondary | ICD-10-CM | POA: Diagnosis not present

## 2020-07-08 DIAGNOSIS — I69398 Other sequelae of cerebral infarction: Secondary | ICD-10-CM

## 2020-07-08 DIAGNOSIS — I7774 Dissection of vertebral artery: Secondary | ICD-10-CM | POA: Diagnosis not present

## 2020-07-08 DIAGNOSIS — Z8673 Personal history of transient ischemic attack (TIA), and cerebral infarction without residual deficits: Secondary | ICD-10-CM | POA: Diagnosis not present

## 2020-07-08 DIAGNOSIS — G8929 Other chronic pain: Secondary | ICD-10-CM | POA: Diagnosis not present

## 2020-07-08 DIAGNOSIS — M25562 Pain in left knee: Secondary | ICD-10-CM | POA: Diagnosis not present

## 2020-07-08 DIAGNOSIS — I69393 Ataxia following cerebral infarction: Secondary | ICD-10-CM | POA: Diagnosis not present

## 2020-07-08 DIAGNOSIS — I10 Essential (primary) hypertension: Secondary | ICD-10-CM

## 2020-07-08 NOTE — Progress Notes (Signed)
[facility] [address] [place] [address] [phone]       STROKE FOLLOW UP NOTE  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 Follow-up    6 month f/u for stroke. States she has noticed she is having issues with her left leg.   . room 5    alone     HPI:   Today, 07/08/2020, [name] returns for stroke follow-up.  Residual deficits right-sided ataxia with gait impairment and occasional dizziness Does report recent fall after attempting to pick up a basketball and had difficulty controlling right leg.  Hip soreness but otherwise no injury.  Denies hitting head. Has difficulty writing due to RUE ataxia and has been trying to learn to write left-handed C/o left knee pain and questions if this is due to gait abnormality Limited activity tolerance due to RLE fatigue with increased exertion Occasional dizziness but overall greatly improving No residual visual deficits Denies new or worsening stroke/TIA symptoms  Remains on aspirin 81 mg daily without bleeding or bruising Blood pressure today 121/82 - continues on Hyzaar managed by PCP  No further concerns at this time      History provided for reference purposes only Update 01/08/2020 JM: [name] is a 39 year old female who is being seen today for stroke follow-up.  Residual deficits of occasional dizziness sensation while laying down and hyperextending neck but otherwise denies any type of vertigo dizziness symptoms.  Vision has greatly improved since undergoing eye procedure with only occasional symptoms of diplopia but otherwise doing great.  Repeat CTA head/neck showed resolution of prior dissection and recanalized right V3 segment with normal caliber but did show continued 50% right P2 segment narrowing due to  uncovertebral spur.  Continues on aspirin 325 mg daily without bleeding or bruising.  Blood pressure today 118/78. No further concerns at this time.   Update 09/01/2019 (virtual visit) JM: [name] is being seen today for stroke follow-up and clearance to undergo surgical eye procedure with Dr. Frederico Washington [facility]).  Residual deficits of occasional dizziness and mild decreased RUE coordination/ataxia but improvement. Continues to have diplopia but optho believes due to right eye higher than left  Plans on undergoing procedure that will correct muscles of right eye due to ongoing diplopia. At prior visit, recommended continuation of aspirin 325 mg daily but she has discontinued this approximately 3 months ago as she did not think it was necessary to continue.  No repeat imaging has been done at this time due to prior visit imaging limited due to emergent needs only due to pandemic restrictions.  It was recommended to follow-up in 4 months but unfortunately that did not happen.  Blood pressure check during virtual visit at 131/86.  Typically SBP 120-130.  Continues on valsartan-hydrochlorothiazide daily.  No further concerns at this time.   Virtual visit 02/22/2019 JM: She reports since being home she has been doing well with continued residual deficits of right hemiparesis and visual deficits. She has returned back to all prior activites but does need moderate assistance with cooking due to right arm weakness. She was participating in therapy thru neuro rehab but due to pandemic, this has been postponed.  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  Does not appear to have difficulties with peripheral vision.  She states that when she attempts to read something or watch TV, she will close her right eye and is able to see much clear throughout her left.  She plans on calling neuro-ophthalmology Dr. Frederico Washington which was  recommended during CIR.  She does endorse occasional dizziness only when she tilts her head up but will resolve once in the neutral position.  She denies any other periods of dizziness or balance difficulties.  She denies neck pain or headaches. She continues on aspirin 345m daily without side effects of bleeding or bruising.  She does monitor BP at home and was recently started on hydrochlorothiazide by her PCP on 02/05/2019 with BP typically ranging 130s/90s.  She did check BP during visit which resulted in 141/99 and heart rate 87.  She does endorses being typically higher than what she has been obtaining prior and feels as though it is due to increased anxiety with virtual visit along with attempting to care for her 2 children during the visit as her husband is currently working.  No further concerns at this time.  Denies new or worsening stroke/TIA symptoms.  Stroke admission 01/04/2019: Tonya SmartScottis a 39y.o.femalepast medical history of ADD and panic attacks, [facility]. According to the husband, she had been complaining of days to weeks of right-sided neck pain. No other focal deficits were noted by the family. The morning of admission, she woke up with a bad headache and neck pain along with feelings of anxiety. When attempts were made in the ED to have her ambulate, she was very wobbly and ataxic. That evening, a CT head obtained due to lethargy and ongoing complaints of dizziness. CT head reviewed and showed a evolving acute ischemic right cerebellar and bilateral PCA territory infarcts with involvement of bilateral occipital lobes and thalami along with trace petechial hemorrhage at the occipital poles without frank hemorrhagic transformation. There was associated mass-effect within the right posterior fossa with partial effacement of the right basilar cisterns and fourth ventricle but no obstructive hydrocephalus at the time. CTA head  and neck showed acute right vertebral artery dissection involving the right V3 segment at the level of the right C1 transverse foramen with probable small dissection flap just distally at the right V3 V4 junction with associated moderate stenosis. There is also probable distal bilateral P3 P4 occlusions possibly thromboembolic in nature. No stenosis or occlusions in the anterior circulation. Denies any neck trauma, manipulation or accidents. MRI brain reviewed and showed multifocal right cerebellar infarction relfecting R VA (B thalamic, left temporal lobe and right occipital lobe reflecting thalamoperforate and B PCA) with significant mass effect on fourth ventricle without hydrocephalus. 2D echo showed EF of 60-65% without cardiac source of embolus.  LDL 88 and A1c 5.7. Other stroke risk factors include obesity but no prior history of stroke. She was discharged to CIR in stable condition on 01/03/19 and discharged home on 01/16/19 with outpatient therapy recommendations.   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 Mass effect R post fossa w/ partial effacement R basilar cisterns and 4th ventricle  CTA head & neck R  VA dissction R V3 w/ flap distal R V3/4. probabl distal B P3/4 occlusions  F/u CT unchanged post circ infarcts. Unchanged edema w/o hydro  MRI Multifocal R cerebellar infarction reflecting R VA (B thalami, LEFT temporal lobe, and RIGHT occipital lobe reflecting thalamoperforate and B PCA) Significant mass effect on fourth ventricle without hydrocephalus  2D Echo EF 60-65%. No source of embolus   LDL88  HgbA1c5.7  SCDs for VTE prophylaxis  No antithromboticprior to admission, now on No antithrombotic. Given dissection, added aspirin 325 mg  daily  Therapy recommendations: CIR  Disposition: CIR   ROS:   14 system review of systems performed and negative with exception of see HPI  PMH:  Past Medical History:  Diagnosis Date  . ADD (attention deficit disorder)   . Exercise-induced asthma   . Family history of bladder cancer   . Family history of brain cancer   . Family history of breast cancer   . Family history of cervical cancer   . Family history of skin cancer   . Hx of varicella   . Kidney stones   . S/P cesarean section 05/05/2016  . S/P tubal ligation 05/05/2016  . Stroke ([place])   . Term pregnancy, repeat 05/04/2016    PSH:  Past Surgical History:  Procedure Laterality Date  . BACK SURGERY     scoliosis age 31  . BILATERAL SALPINGECTOMY Bilateral 05/05/2016   Procedure: BILATERAL SALPINGECTOMY;  Surgeon: Janyth Contes, MD;  Location: [place];  Service: Obstetrics;  Laterality: Bilateral;  . CESAREAN SECTION    . CESAREAN SECTION N/A 05/05/2016   Procedure: CESAREAN SECTION;  Surgeon: Janyth Contes, MD;  Location: [place];  Service: Obstetrics;  Laterality: N/A;  . CHOLECYSTECTOMY    . HAND SURGERY    . HERNIA REPAIR    . KIDNEY STONE SURGERY     R--surgery; L lithotripsy.  Dr. Gaynelle Arabian    Social History:  Social History   Socioeconomic History  . Marital status: Married    Spouse name: Not on file  . Number of children: Not on file  . Years of education: Not on file  . Highest education level: Not on file  Occupational History  . Not on file  Tobacco Use  . Smoking status: Never Smoker  . Smokeless tobacco: Never Used  Vaping Use  . Vaping Use: Never used  Substance and Sexual Activity  . Alcohol use: No    Comment: occasional wine  . Drug use: No  . Sexual activity: Yes    Partners: Male    Birth control/protection: Surgical  Other Topics Concern  . Not on file  Social History Narrative  . Not on file   Social Determinants of Health    Financial Resource Strain:   . Difficulty of Paying Living Expenses:   Food Insecurity:   . Worried About Charity fundraiser in the Last Year:   . Arboriculturist in the Last Year:   Transportation Needs:   . Film/video editor (Medical):   Marland Kitchen Lack of Transportation (Non-Medical):   Physical Activity:   . Days of Exercise per Week:   . Minutes of Exercise per Session:   Stress:   . Feeling of Stress :   Social Connections:   . Frequency of Communication with Friends and Family:   . Frequency of Social Gatherings with Friends and Family:   . Attends Religious Services:   . Active Member of Clubs or Organizations:   .  Attends Archivist Meetings:   Marland Kitchen Marital Status:   Intimate Partner Violence:   . Fear of Current or Ex-Partner:   . Emotionally Abused:   Marland Kitchen Physically Abused:   . Sexually Abused:     Family History:  Family History  Problem Relation Age of Onset  . Breast cancer Mother 28       bilateral, BRCA reportedly neg  . Breast cancer Maternal Aunt        dx. early 64s, BRCA reportedly neg  . Breast cancer Maternal Grandmother        dx. 64s  . Bladder Cancer Maternal Grandfather 86       non-smoker  . Cervical cancer Paternal Grandmother 62  . Skin cancer Paternal Grandfather   . Brain cancer Other        paternal grandmother's sister  . Cancer Other        unknown type or age of diagnosis; maternal grandfather's brothers    Medications:   Current Outpatient Medications on File Prior to Visit  Medication Sig Dispense Refill  . amphetamine-dextroamphetamine (ADDERALL) 10 MG tablet Take 1 tablet (10 mg total) by mouth daily. 30 tablet 0  . aspirin EC 81 MG tablet Take 1 tablet (81 mg total) by mouth daily.    Derrill Memo ON 09/03/2020] lisdexamfetamine (VYVANSE) 50 MG capsule Take 1 capsule (50 mg total) by mouth daily. 30 capsule 0  . [START ON 08/04/2020] lisdexamfetamine (VYVANSE) 50 MG capsule Take 1 capsule (50 mg total) by mouth daily. 30  capsule 0  . lisdexamfetamine (VYVANSE) 50 MG capsule Take 1 capsule (50 mg total) by mouth daily. 30 capsule 0  . losartan-hydrochlorothiazide (HYZAAR) 50-12.5 MG tablet TAKE 1 TABLET BY MOUTH DAILY. 30 tablet 0  . sertraline (ZOLOFT) 100 MG tablet TAKE 1 TABLET BY MOUTH AT BEDTIME. 30 tablet 0   No current facility-administered medications on file prior to visit.    Allergies:  No Known Allergies   Physical Exam  Vitals:   07/08/20 0848  BP: 121/82  Pulse: 77  Weight: 162 lb 6.4 oz (73.7 kg)  Height: '5\' 4"'  (1.626 m)    General: well developed, well nourished, very pleasant middle-age Caucasian female, seated, in no evident distress Neck: supple with no carotid or supraclavicular bruits Cardiovascular: regular rate and rhythm, no murmurs Vascular:  Normal pulses all extremities   Neurologic Exam Mental Status: Awake and fully alert.   Fluent speech and language.  Oriented to place and time. Recent and remote memory intact. Attention span, concentration and fund of knowledge appropriate. Mood and affect appropriate.  Cranial Nerves: Pupils equal, briskly reactive to light. Extraocular movements show saccadic dysmetria. Visual fields full to confrontation. Hearing intact. Facial sensation intact. Face, tongue, palate moves normally and symmetrically.  Motor: Normal bulk and tone. Normal strength in all tested extremity muscles. Sensory.: intact to touch , pinprick , position and vibratory sensation.  Coordination: Rapid alternating movements normal in all extremities except decreased right hand dexterity. Finger-to-nose and heel-to-shin performed right-sided ataxia UE>LE.  Gait and Station: Arises from chair without difficulty. Stance is normal.  Ataxic gait with exaggerated right leg lift and mild hyperextension of left knee.  Able to ambulate without assistive device. Able to tandem with mild difficulty. difficulty standing on right leg alone. Reflexes: 1+ and symmetric. Toes  downgoing.         ASSESSMENT: [name] is a 39 y.o. year old female here with history of right VA dissection  leading to R PCA infarct with unclear etiology of dissection on 01/02/19. Vascular risk factors include obesity.  She did undergo ophthalmology procedure with improvement of diplopia.  Repeat CTA showed resolution of prior dissection.    PLAN:  1. R PCA infarct :  a. Residual deficits: Right-sided ataxia and occasional dizziness.  Gait abnormality likely contributing to left knee pain and increased dependence on left leg.  Referral placed to neuro rehab PT for compensation strategies and re-eval of current gait pattern.  May potentially benefit from OT for RUE ataxia and compensation strategies but she would like to hold off at this time.   b. Continue aspirin 81 mg daily for secondary stroke prevention.  c. Ensure close PCP follow-up for aggressive stroke risk factor management. 2. HTN:  a. BP goal<130/90 b. Continue Hyzaar managed and monitored by PCP 3. R VA dissection a. Resolved b. No further intervention required    Follow up in 6 months or call earlier if needed   I spent 30 minutes of face-to-face and non-face-to-face time with patient.  This included previsit chart review, lab review, study review, order entry, electronic health record documentation, patient education regarding residual stroke deficits and reevaluation by PT as well as exercises at home, history of prior stroke and managing stroke risk factors and answered all questions to patient satisfaction     Frann Rider, [facility]  [facility] [address] [place], [place] [address]  Phone [phone] Fax [phone] Note: This document was prepared with digital dictation and possible smart phrase technology. Any transcriptional errors that result from this process are unintentional.

## 2020-07-08 NOTE — Patient Instructions (Signed)
Re-eval by physical therapy   Continue to stay active with regular exercise as tolerated    Follow up in 6 months or call earlier if needed

## 2020-07-11 NOTE — Progress Notes (Signed)
I agree with the above plan 

## 2020-07-14 ENCOUNTER — Other Ambulatory Visit: Payer: Self-pay | Admitting: Family Medicine

## 2020-07-14 NOTE — Telephone Encounter (Signed)
Pt. Requesting refill on adderall 10mg  last filled 06/16/20 and pt. Last apt was 11/06/19.

## 2020-07-17 IMAGING — CT CT ANGIO NECK
2 of 12 series · 6 of 33 positions shown · IV contrast (ISOVUE 370)
Comparison: None available.

CLINICAL DATA: Initial evaluation for acute lethargy, vertigo.

EXAM:
CT ANGIOGRAPHY HEAD AND NECK
TECHNIQUE: Multidetector CT imaging of the head and neck was performed using
the standard protocol during bolus administration of intravenous
contrast. Multiplanar CT image reconstructions and MIPs were
obtained to evaluate the vascular anatomy. Carotid stenosis
measurements (when applicable) are obtained utilizing NASCET
criteria, using the distal internal carotid diameter as the
denominator.
CONTRAST:  80mL PJ6ODO-Z70 IOPAMIDOL (PJ6ODO-Z70) INJECTION 76%

[Series 10: cta head neck thins · axial · 0.37mm/px · z∈[-316,-85]mm · 4 of 770 slices shown]
[im 154/770  soft-tissue]
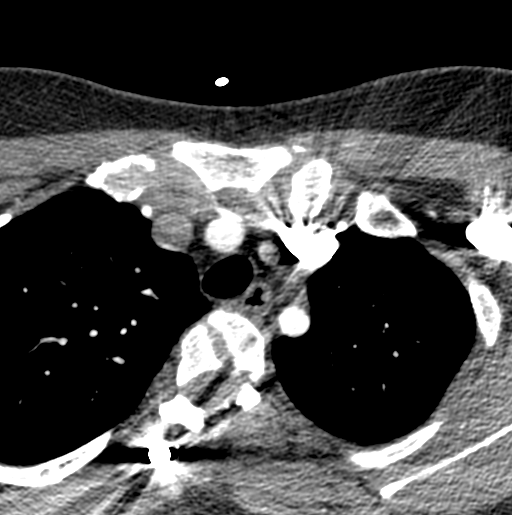
[im 308/770  bone]
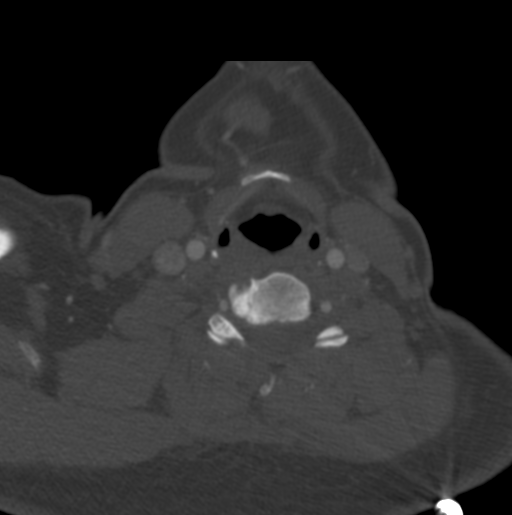
[im 462/770  soft-tissue]
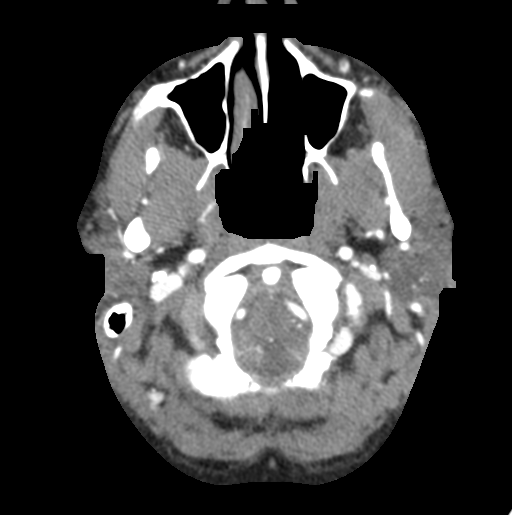
[im 616/770  bone]
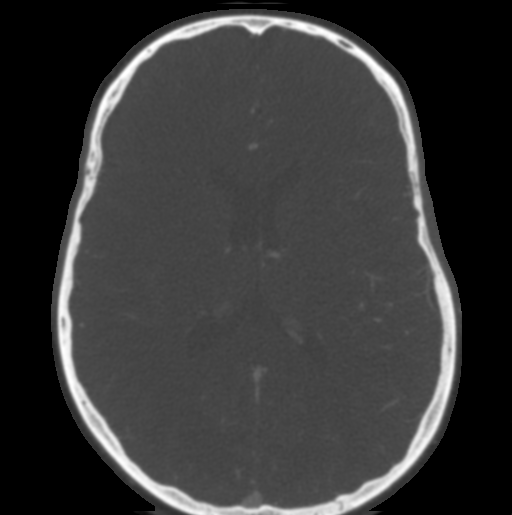

[Series 11: ax thin · axial · 0.37mm/px · z∈[-264,-136]mm · 2 of 385 slices shown]
[im 129/385  soft-tissue]
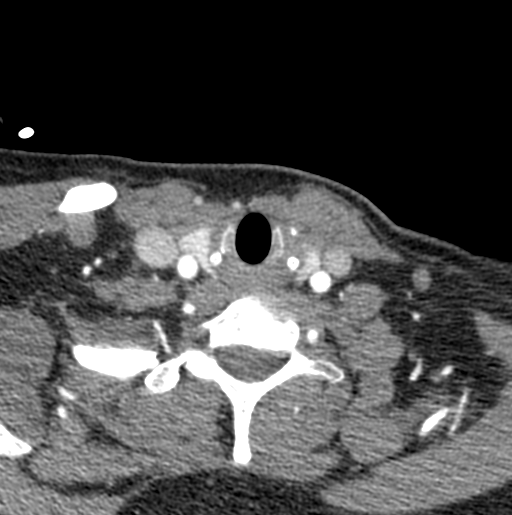
[im 257/385  soft-tissue]
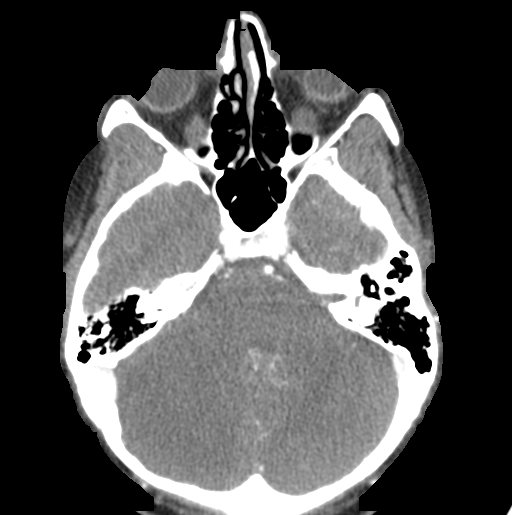

[6 of 33 positions shown; findings below may reference images not displayed]

FINDINGS: CT HEAD FINDINGS

Brain: Cerebral volume within normal limits. Confluent hypodensity
involving the right cerebellum, compatible with evolving acute
ischemic infarct. Possible more mild involvement within the adjacent
left cerebellar hemisphere, although not entirely certain. Evolving
small acute bilateral PCA territory infarcts, right greater than
left. Evolving acute bilateral thalamic infarcts. Trace hyperdensity
at the bilateral occipital poles compatible with associated
petechial hemorrhage (series 7, image 58). No frank hemorrhagic
transformation. Associated mass effect within the right posterior
fossa with partial effacement of the fourth ventricle and basilar
cistern crowding. Fourth ventricle remains patent at this time. No
obstructive hydrocephalus.

Remainder of the brain is normal in appearance. No mass lesion or
midline shift. No extra-axial fluid collection.

Vascular: No hyperdense vessel.

Skull: Scalp soft tissues and calvarium within normal limits.

Sinuses: Paranasal sinuses and mastoids are clear.

Orbits: Globes and orbital soft tissues within normal limits.

Review of the MIP images confirms the above findings

CTA NECK FINDINGS

Aortic arch: Partially visualized aortic arch normal in caliber with
normal branch pattern. No hemodynamically significant stenosis about
the origin of the great vessels. Visualized subclavian arteries
patent.

Right carotid system: Right common and internal carotid arteries
widely patent without stenosis, dissection, or occlusion.

Left carotid system: Left common and internal carotid arteries
widely patent without stenosis, dissection, or occlusion.

Vertebral arteries: Both vertebral arteries arise from the
subclavian arteries. Left vertebral artery widely patent and normal
in appearance. Right vertebral artery widely patent and normal in
appearance throughout its P2 segment. There is multifocal intimal
irregularity with associated moderate stenosis involving the left V3
segment at the level of the C1 transverse foramen (series 10, image
33). Finding compatible with acute arterial dissection. Subtle
probable flap noted just distally within the right V3 segment just
prior to crossing into the cranial vault (series 12, image 135).

Skeleton: No acute osseous abnormality. No discrete lytic or blastic
osseous lesions. Pronounced thoracic dextroscoliosis with bilateral
Harrington rod fixation partially visualized.

Other neck: No other acute soft tissue abnormality within the neck.

Upper chest: Partially visualized upper chest demonstrates no acute
finding.

Review of the MIP images confirms the above findings

CTA HEAD FINDINGS

Anterior circulation: Internal carotid arteries widely patent to the
termini without stenosis. A1 segments, anterior communicating
artery, and anterior cerebral arteries widely patent. M1 segments
widely patent bilaterally. Normal MCA bifurcations. Distal MCA
branches well perfused and symmetric.

Posterior circulation: Probable small dissection flap at the right
V3/V4 junction as the right vertebral crosses into the cranial vault
as above. Right V4 segment otherwise normal in appearance and
caliber without acute abnormality. Left vertebral widely patent to
the vertebrobasilar junction without stenosis. Patent left PICA.
Right PICA not seen, and may be occluded given the acute right
cerebellar infarct. Probable short fenestration within the proximal
basilar artery noted (series 10, image 273). Basilar patent distally
without significant stenosis or other acute finding. Superior
cerebral arteries are patent proximally. Both of the posterior
cerebral artery supplied via the basilar. PCAs patent proximally.
Suspected bilateral distal P3/P4 occlusions (series 12, image 126 on
the left, image 140 on the right).

Venous sinuses: Patent.

Anatomic variants: None significant.

Delayed phase: No abnormal enhancement.

Review of the MIP images confirms the above findings
IMPRESSION: CT HEAD IMPRESSION

1. Evolving acute ischemic right cerebellar and bilateral PCA
territory infarcts as above, with involvement of the bilateral
occipital lobes and thalami. Associated trace petechial hemorrhage
at the occipital poles without frank hemorrhagic transformation.
2. Associated mass effect within the right posterior fossa with
partial effacement of the right basilar cisterns and fourth
ventricle. No obstructive hydrocephalus at this time.

CTA HEAD AND NECK IMPRESSION

1. Acute right vertebral artery dissection, involving the right V3
segment at the level of the right C1 transverse foramen, with
probable small dissection flap just distally at the right V3/V4
junction. Associated moderate stenosis.
2. Probable distal bilateral P3/P4 occlusions as above,
thromboembolic in nature.
3. Otherwise negative CTA of the head and neck. Normal left
vertebral artery. Incidental short fenestration of the proximal
basilar artery noted.

Critical Value/emergent results were called by telephone at the time
of interpretation on 01/02/2019 at [DATE] to Dr. KEINOR MORTIMER , who
verbally acknowledged these results.

## 2020-07-19 ENCOUNTER — Telehealth: Payer: Self-pay | Admitting: Family Medicine

## 2020-07-19 NOTE — Telephone Encounter (Signed)
P.A. VYVANSE  °

## 2020-07-27 NOTE — Telephone Encounter (Signed)
P.A. denied thru [facility].  Called pharmacy went thru with Medicaid for $3.  Called pt informed

## 2020-07-29 ENCOUNTER — Other Ambulatory Visit: Payer: Self-pay | Admitting: Family Medicine

## 2020-07-29 ENCOUNTER — Telehealth: Payer: Self-pay | Admitting: Family Medicine

## 2020-07-29 DIAGNOSIS — I10 Essential (primary) hypertension: Secondary | ICD-10-CM

## 2020-07-29 NOTE — Telephone Encounter (Signed)
Pt called, she needs refills on  zoloft and bp meds(doesnt remember name) [facility]

## 2020-07-29 NOTE — Telephone Encounter (Signed)
Have her schedule a med check appointment but do not let her run out

## 2020-07-30 NOTE — Telephone Encounter (Signed)
Appt made kh

## 2020-07-30 NOTE — Telephone Encounter (Signed)
Pt has set an appt 08-11-20. Please fill zoloft. [place]

## 2020-08-11 ENCOUNTER — Ambulatory Visit (INDEPENDENT_AMBULATORY_CARE_PROVIDER_SITE_OTHER): Payer: 59 | Admitting: Family Medicine

## 2020-08-11 ENCOUNTER — Other Ambulatory Visit: Payer: Self-pay

## 2020-08-11 ENCOUNTER — Encounter: Payer: Self-pay | Admitting: Family Medicine

## 2020-08-11 VITALS — BP 112/72 | HR 81 | Temp 98.2°F | Ht 64.0 in | Wt 161.6 lb

## 2020-08-11 DIAGNOSIS — Z9189 Other specified personal risk factors, not elsewhere classified: Secondary | ICD-10-CM

## 2020-08-11 DIAGNOSIS — Z79899 Other long term (current) drug therapy: Secondary | ICD-10-CM

## 2020-08-11 DIAGNOSIS — Z8673 Personal history of transient ischemic attack (TIA), and cerebral infarction without residual deficits: Secondary | ICD-10-CM | POA: Diagnosis not present

## 2020-08-11 DIAGNOSIS — I10 Essential (primary) hypertension: Secondary | ICD-10-CM | POA: Insufficient documentation

## 2020-08-11 DIAGNOSIS — Z23 Encounter for immunization: Secondary | ICD-10-CM

## 2020-08-11 DIAGNOSIS — I6310 Cerebral infarction due to embolism of unspecified precerebral artery: Secondary | ICD-10-CM

## 2020-08-11 DIAGNOSIS — F410 Panic disorder [episodic paroxysmal anxiety] without agoraphobia: Secondary | ICD-10-CM

## 2020-08-11 DIAGNOSIS — F341 Dysthymic disorder: Secondary | ICD-10-CM | POA: Diagnosis not present

## 2020-08-11 DIAGNOSIS — F902 Attention-deficit hyperactivity disorder, combined type: Secondary | ICD-10-CM | POA: Diagnosis not present

## 2020-08-11 DIAGNOSIS — Z1159 Encounter for screening for other viral diseases: Secondary | ICD-10-CM | POA: Diagnosis not present

## 2020-08-11 DIAGNOSIS — Z803 Family history of malignant neoplasm of breast: Secondary | ICD-10-CM

## 2020-08-11 MED ORDER — SERTRALINE HCL 50 MG PO TABS: 50.0000 mg | ORAL_TABLET | Freq: Every day | ORAL | 0 refills | Status: DC

## 2020-08-11 NOTE — Progress Notes (Signed)
   Subjective:    Patient ID: [name], female    DOB: Feb 08, 1981, 39 y.o.   MRN: [id number]  HPI She is here for a med check appointment.  She does have underlying ADD and is getting good results with her present medication regimen.  She does need an extra booster later on in the day since the kids are coming home from school and she needs to stay focused.  She is very happy with her present dosing regimen and response.  She has a previous history of panic disorder and slight dysthymia and has been on Zoloft for several years.  She is interested in potentially stopping this.  She states that psychologically she is doing quite nicely.  She does have a previous history of CVA and does take aspirin on a daily basis.  She continues on losartan/HCTZ.  There is a strong history of breast cancer and she did have genetic testing done which could put her at higher than normal risk.  She has already had a mammogram.  Otherwise she has no concerns or complaints.   Review of Systems     Objective:   Physical Exam Alert and in no distress. Tympanic membranes and canals are normal. Pharyngeal area is normal. Neck is supple without adenopathy or thyromegaly. Cardiac exam shows a regular sinus rhythm without murmurs or gallops. Lungs are clear to auscultation.        Assessment & Plan:  Attention deficit hyperactivity disorder (ADHD), combined type  Cerebrovascular accident (CVA) due to embolism of precerebral artery ([place]) - Plan: Comprehensive metabolic panel, CBC with Differential/Platelet, Lipid panel  At high risk for breast cancer  Family history of breast cancer  Panic disorder - Plan: sertraline (ZOLOFT) 50 MG tablet  Encounter for long-term (current) use of medications - Plan: Comprehensive metabolic panel, CBC with Differential/Platelet, Lipid panel  Encounter for hepatitis C screening test for low risk patient - Plan: Hepatitis C antibody  Need for influenza vaccination - Plan: Flu  Vaccine QUAD 6+ mos PF IM (Fluarix Quad PF)  Dysthymia  History of CVA (cerebrovascular accident)  Essential hypertension She is interested in coming off Zoloft. She will continue on her present medication regimen except for Zoloft.  I will drop her down to 50 mg and then potentially stop after that.  She will keep in touch concerning this.

## 2020-08-12 LAB — CBC WITH DIFFERENTIAL/PLATELET
Basophils Absolute: 0.1 10*3/uL (ref 0.0–0.2)
Basos: 1 %
EOS (ABSOLUTE): 0.2 10*3/uL (ref 0.0–0.4)
Eos: 3 %
Hematocrit: 42.1 % (ref 34.0–46.6)
Hemoglobin: 14.5 g/dL (ref 11.1–15.9)
Immature Grans (Abs): 0 10*3/uL (ref 0.0–0.1)
Immature Granulocytes: 0 %
Lymphocytes Absolute: 3.2 10*3/uL — ABNORMAL HIGH (ref 0.7–3.1)
Lymphs: 34 %
MCH: 30.9 pg (ref 26.6–33.0)
MCHC: 34.4 g/dL (ref 31.5–35.7)
MCV: 90 fL (ref 79–97)
Monocytes Absolute: 0.7 10*3/uL (ref 0.1–0.9)
Monocytes: 7 %
Neutrophils Absolute: 5.1 10*3/uL (ref 1.4–7.0)
Neutrophils: 55 %
Platelets: 296 10*3/uL (ref 150–450)
RBC: 4.7 x10E6/uL (ref 3.77–5.28)
RDW: 12.5 % (ref 11.7–15.4)
WBC: 9.2 10*3/uL (ref 3.4–10.8)

## 2020-08-12 LAB — COMPREHENSIVE METABOLIC PANEL
ALT: 16 IU/L (ref 0–32)
AST: 19 IU/L (ref 0–40)
Albumin/Globulin Ratio: 1.9 (ref 1.2–2.2)
Albumin: 4.7 g/dL (ref 3.8–4.8)
Alkaline Phosphatase: 99 IU/L (ref 44–121)
BUN/Creatinine Ratio: 20 (ref 9–23)
BUN: 14 mg/dL (ref 6–20)
Bilirubin Total: <0.2 mg/dL (ref 0.0–1.2)
CO2: 23 mmol/L (ref 20–29)
Calcium: 9.8 mg/dL (ref 8.7–10.2)
Chloride: 103 mmol/L (ref 96–106)
Creatinine, Ser: 0.69 mg/dL (ref 0.57–1.00)
GFR calc Af Amer: 130 mL/min/{1.73_m2} (ref >59)
GFR calc non Af Amer: 112 mL/min/{1.73_m2} (ref >59)
Globulin, Total: 2.5 g/dL (ref 1.5–4.5)
Glucose: 96 mg/dL (ref 65–99)
Potassium: 4.4 mmol/L (ref 3.5–5.2)
Sodium: 137 mmol/L (ref 134–144)
Total Protein: 7.2 g/dL (ref 6.0–8.5)

## 2020-08-12 LAB — LIPID PANEL
Chol/HDL Ratio: 3.1 ratio (ref 0.0–4.4)
Cholesterol, Total: 161 mg/dL (ref 100–199)
HDL: 52 mg/dL (ref >39)
LDL Chol Calc (NIH): 85 mg/dL (ref 0–99)
Triglycerides: 140 mg/dL (ref 0–149)
VLDL Cholesterol Cal: 24 mg/dL (ref 5–40)

## 2020-08-12 LAB — HEPATITIS C ANTIBODY: Hep C Virus Ab: <0.1 s/co ratio (ref 0.0–0.9)

## 2020-08-14 ENCOUNTER — Telehealth: Payer: Self-pay | Admitting: Family Medicine

## 2020-08-14 NOTE — Telephone Encounter (Signed)
Pt needs refill on Adderrall sent to [facility]

## 2020-08-15 MED ORDER — AMPHETAMINE-DEXTROAMPHETAMINE 10 MG PO TABS: 10.0000 mg | ORAL_TABLET | Freq: Every day | ORAL | 0 refills | Status: DC

## 2020-08-15 NOTE — Telephone Encounter (Signed)
Pt. Called back to see if her Adderall had been refilled yet or if the message got to you yet. I told her that it had not been refilled yet but you did receive a message for a refill on her Adderall. Pt. Last apt was 08/11/20.

## 2020-08-22 ENCOUNTER — Telehealth (INDEPENDENT_AMBULATORY_CARE_PROVIDER_SITE_OTHER): Payer: 59 | Admitting: Family Medicine

## 2020-08-22 ENCOUNTER — Encounter: Payer: Self-pay | Admitting: Family Medicine

## 2020-08-22 ENCOUNTER — Other Ambulatory Visit: Payer: Self-pay

## 2020-08-22 VITALS — Wt 160.0 lb

## 2020-08-22 DIAGNOSIS — J0140 Acute pansinusitis, unspecified: Secondary | ICD-10-CM

## 2020-08-22 DIAGNOSIS — R0981 Nasal congestion: Secondary | ICD-10-CM

## 2020-08-22 MED ORDER — AMOXICILLIN 875 MG PO TABS: 875.0000 mg | ORAL_TABLET | Freq: Two times a day (BID) | ORAL | 0 refills | Status: DC

## 2020-08-22 NOTE — Progress Notes (Signed)
   Subjective:  Documentation for virtual audio and video telecommunications through [place]  encounter:  The patient was located in her care which was parked. 2 patient identifiers used.  The provider was located in the office. The patient did consent to this visit and is aware of possible charges through their insurance for this visit.  The other persons participating in this telemedicine service were none. Time spent on call was 12  minutes and in review of previous records 15 minutes total.  This virtual service is not related to other E/M service within previous 7 days.   Patient ID: [name], female    DOB: [birth year], 39 y.o.   MRN: [id number]  HPI Chief Complaint  Patient presents with  . sinus infection    sinus infecton x last thursday. sinus pressure, pain, congestion, no fever or cough   Complains of an 8 day history of head congestion, sinus and facial pain.  No rhinorrhea or drainage.  She can breathe through her nose ok.   States she had Covid in January. Her son is in school and has a cold.   She took ibuprofen. She is taking Mucinex as of yesterday.   Denies fever, chills, dizziness, body aches, headache, chest pain, shortness of breath, cough, abdominal pain, N/V/D.  No loss of taste or smell.   Reviewed allergies, medications, past medical, surgical, family, and social history.    Review of Systems Pertinent positives and negatives in the history of present illness.     Objective:   Physical Exam Wt 160 lb (72.6 kg)   BMI 27.46 kg/m   Alert and oriented and in no acute distress.  She is congested sounding.  Sinus tenderness when she palpates her sinuses.  Respirations unlabored.      Assessment & Plan:  Acute pansinusitis, recurrence not specified - Plan: amoxicillin (AMOXIL) 875 MG tablet  Nasal congestion  Discussed that her symptoms appear to be related to sinusitis but I cannot rule out Covid.  Offered her times today to come in  for testing but she has a full schedule and states she is not close by.  For now, she will take amoxicillin and treat her symptoms with Tylenol or ibuprofen, hydration, Mucinex and Sudafed.  She may also try nasal steroid inhaler. Follow-up if worsening or not back to baseline when the antibiotic is complete

## 2020-08-27 ENCOUNTER — Telehealth: Payer: Self-pay

## 2020-08-27 DIAGNOSIS — F410 Panic disorder [episodic paroxysmal anxiety] without agoraphobia: Secondary | ICD-10-CM

## 2020-08-27 DIAGNOSIS — I10 Essential (primary) hypertension: Secondary | ICD-10-CM

## 2020-08-27 MED ORDER — SERTRALINE HCL 50 MG PO TABS: 50.0000 mg | ORAL_TABLET | Freq: Every day | ORAL | 3 refills | Status: DC

## 2020-08-27 MED ORDER — LOSARTAN POTASSIUM-HCTZ 50-12.5 MG PO TABS: 1.0000 | ORAL_TABLET | Freq: Every day | ORAL | 3 refills | Status: DC

## 2020-08-27 NOTE — Telephone Encounter (Signed)
Pt. Called stating that she needs a refill on her sertraline and  Losartan/hctz medicine sent in to [facility] pt. Lats apt was 08/22/20.

## 2020-08-30 ENCOUNTER — Other Ambulatory Visit: Payer: 59

## 2020-09-01 ENCOUNTER — Telehealth: Payer: Self-pay | Admitting: Family Medicine

## 2020-09-01 NOTE — Telephone Encounter (Signed)
She should not run out until the 13th but I can give her 1 day early.  Go ahead and okay up

## 2020-09-01 NOTE — Telephone Encounter (Signed)
Done and pt was advised Kh

## 2020-09-01 NOTE — Telephone Encounter (Signed)
Pt called and states that [facility] is available 10/13 but she will be out tomorrow and would like to pick up tomorrow. Please advise pt at [phone]

## 2020-09-05 ENCOUNTER — Telehealth: Payer: Self-pay

## 2020-09-05 NOTE — Telephone Encounter (Signed)
P.A. VYVANSE  °

## 2020-09-08 NOTE — Telephone Encounter (Signed)
Called pharmacy went thru on [address]

## 2020-09-11 ENCOUNTER — Telehealth: Payer: Self-pay | Admitting: Family Medicine

## 2020-09-11 MED ORDER — AMPHETAMINE-DEXTROAMPHETAMINE 10 MG PO TABS: 10.0000 mg | ORAL_TABLET | Freq: Every day | ORAL | 0 refills | Status: DC

## 2020-09-11 NOTE — Telephone Encounter (Signed)
Pt called for refills of Adderall 10 MG. Please send to [facility]. Pt can be reached at [phone].

## 2020-09-12 ENCOUNTER — Telehealth: Payer: Self-pay | Admitting: Family Medicine

## 2020-09-12 NOTE — Telephone Encounter (Signed)
Please give the ok

## 2020-09-12 NOTE — Telephone Encounter (Signed)
Pt. Called stating she wanted to know if her Adderall prescription had been sent in. I told her it was sent in on 09/11/20 but the earliest fill date is 09/14/20. She said her pharmacy is closed on Sundays which would be 09/14/20, so she wanted to know if you would give the ok to have it filled on 09/13/20 one day before.

## 2020-09-12 NOTE — Telephone Encounter (Signed)
Pharmacy called and is requesting to refill adderall one day early on sat they are closed on Sunday.   They can be reached at .[place], [place] them that dr Redmond School was out of the office today   Please send to someone else as I leave at 12.30 today

## 2020-09-15 NOTE — Telephone Encounter (Signed)
Called pt she got her medicine today, did not get on sat, the message was sent back to me as I informed not to, pt got the medicine today

## 2020-10-01 ENCOUNTER — Telehealth: Payer: Self-pay | Admitting: Family Medicine

## 2020-10-01 DIAGNOSIS — F902 Attention-deficit hyperactivity disorder, combined type: Secondary | ICD-10-CM

## 2020-10-01 MED ORDER — LISDEXAMFETAMINE DIMESYLATE 50 MG PO CAPS: 50.0000 mg | ORAL_CAPSULE | Freq: Every day | ORAL | 0 refills | Status: DC

## 2020-10-01 NOTE — Telephone Encounter (Signed)
done

## 2020-10-01 NOTE — Telephone Encounter (Signed)
Pt called and is requesting a refill on her vyvanse and states it needs to states it needs to start in the 12th pt uses [facility]

## 2020-10-15 ENCOUNTER — Telehealth: Payer: Self-pay

## 2020-10-15 MED ORDER — AMPHETAMINE-DEXTROAMPHETAMINE 10 MG PO TABS: 10.0000 mg | ORAL_TABLET | Freq: Every day | ORAL | 0 refills | Status: DC

## 2020-10-15 NOTE — Telephone Encounter (Signed)
Pt. Called stating that she needs a refill on her Adderall 10mg  to [facility] pt. Last apt was 08/11/20.

## 2020-10-30 ENCOUNTER — Telehealth: Payer: Self-pay | Admitting: Family Medicine

## 2020-10-30 NOTE — Telephone Encounter (Signed)
Okay to refill early

## 2020-10-30 NOTE — Telephone Encounter (Signed)
Done KH 

## 2020-10-30 NOTE — Telephone Encounter (Signed)
Pt called and states that [facility] is closed on Sundays. Her Vyvanse is refillable on the 12th which is a Sunday. Pt is requested to get that filled Saturday 11/01/2020. Pt can be reached at [phone]

## 2020-11-06 ENCOUNTER — Telehealth (INDEPENDENT_AMBULATORY_CARE_PROVIDER_SITE_OTHER): Payer: 59 | Admitting: Family Medicine

## 2020-11-06 ENCOUNTER — Encounter: Payer: Self-pay | Admitting: Family Medicine

## 2020-11-06 ENCOUNTER — Other Ambulatory Visit: Payer: Self-pay

## 2020-11-06 VITALS — Ht 64.0 in | Wt 160.0 lb

## 2020-11-06 DIAGNOSIS — J3489 Other specified disorders of nose and nasal sinuses: Secondary | ICD-10-CM

## 2020-11-06 NOTE — Progress Notes (Signed)
Start time: 3:42 End time: 4:06  Virtual Visit via Telephone Note  I connected with [name] on 11/06/20 by telephone and verified that I am speaking with the correct person using two identifiers. Attempted video call, but she kept getting error message and was unable to connect.  Location: Patient: Home Provider: office   I discussed the limitations of evaluation and management by telemedicine and the availability of in person appointments. The patient expressed understanding and agreed to proceed.  History of Present Illness:  Chief Complaint  Patient presents with  . Sinusitis    VIRTUAL lots of sinus pain and pressure. Is not blowing out any mucus, using mucinex. No fever that she knows of. No chest symptoms in her chest, all in her sinuses. Symptoms started several days ago-she thought it was allergies.    3 days ago she started getting sinus pressure.  She is unable to get anything to drain. Denies runny nose, PND, cough. She tried Mucinex nasal spray, then tried [facility], neither worked. She also tried liquid Mucinex Fast-Max congestion and HA (acetaminophen, guaifenesin, phenylephrine). She just got this today. She tried similar Equate brand yesterday, one dose (same ingredients) She took ibuprofen this morning, which helped some (worse when it wore off).  She noted her heart rate was faster, took her blood pressure, and BP was 130/89, high for her. P94. She has felt a little dizzy, head feels like a balloon.  Denies cough or sore throat. Unable to blow anything out.  Denies stuffy nose, just sinus pain (eyes hurt, pain in her forehead, and under her eyes).  +sick contact--youngest son with URI symptoms x 2 weeks, improving.  She had COVID in 11/2019. Hasn't had vaccines yet.  PMH, PSH, SH reviewed Notable for HTN and ADHD on stimulant meds as below  Outpatient Encounter Medications as of 11/06/2020  Medication Sig  . amphetamine-dextroamphetamine (ADDERALL) 10  MG tablet Take 1 tablet (10 mg total) by mouth daily.  Marland Kitchen aspirin EC 81 MG tablet Take 1 tablet (81 mg total) by mouth daily.  Marland Kitchen guaiFENesin (MUCINEX PO) Take by mouth.  . lisdexamfetamine (VYVANSE) 50 MG capsule Take 1 capsule (50 mg total) by mouth daily.  Marland Kitchen losartan-hydrochlorothiazide (HYZAAR) 50-12.5 MG tablet Take 1 tablet by mouth daily.  . sertraline (ZOLOFT) 50 MG tablet Take 1 tablet (50 mg total) by mouth daily.  Derrill Memo ON 12/03/2020] lisdexamfetamine (VYVANSE) 50 MG capsule Take 1 capsule (50 mg total) by mouth daily. (Patient not taking: Reported on 11/06/2020)  . [DISCONTINUED] amoxicillin (AMOXIL) 875 MG tablet Take 1 tablet (875 mg total) by mouth 2 (two) times daily.  . [DISCONTINUED] lisdexamfetamine (VYVANSE) 50 MG capsule Take 1 capsule (50 mg total) by mouth daily.   No facility-administered encounter medications on file as of 11/06/2020.   No Known Allergies  ROS: no fever or chills, nausea, vomiting.+sinus headaches, slight dizziness.  Stool was a little loose this morning.  Just finished her menstrual cycle, not uncommon for loose stool then. No bleeding, bruising, rash, shortness of breath or other complaints. See HPI  Observations/Objective:  Ht 5\' 4"  (1.626 m)   Wt 160 lb (72.6 kg)   LMP 11/01/2020 (Exact Date)   BMI 27.46 kg/m   She is alert, oriented.  Normal speech.  No coughing, in no distress Exam is limited due to virtual nature of the visit.   Assessment and Plan:  Sinus pain - exposure to viral URI--encouraged COVID test.  Supportive measures reviewed. Avoid decongestants  due to HTN and ADHD meds   Sinus rinses mucinex plain Saline spray Stay hydrated No phenylephrine Reassured not infection--she was asking if she needed ABX  COVID test recommended and discussed why,    Follow Up Instructions:    I discussed the assessment and treatment plan with the patient. The patient was provided an opportunity to ask questions and all were  answered. The patient agreed with the plan and demonstrated an understanding of the instructions.   The patient was advised to call back or seek an in-person evaluation if the symptoms worsen or if the condition fails to improve as anticipated.  I spent 27 minutes dedicated to the care of this patient, including pre-visit review of records, face to face time, post-visit ordering of testing and documentation.  Vikki Ports, MD

## 2020-11-06 NOTE — Patient Instructions (Signed)
Drink plenty of water. Stop using the combination medications you have been taking.  You shouldn't be taking phenylephrine (decongestant) with high blood pressure and when on ADHD medications (raises blood pressure and pulse).  I recommend taking plain Mucinex 12 hour (guaifenein) twice daily--this is an expectorant that will help loosen the mucus. I also recommend sinus rinses (sinus rinse kit or neti-pot) once or twice daily, to help loosen up the mucus that is plugging the sinuses. You can also use some nasal saline spray as often as needed.  Expect you to feel a little worse (drainage, cough, chest congestion) before you start to feel better.  If the mucus stays persistently discolored (I expect it might be yellow at first), or if you develop fever, shortness of breath, pain with breathing, more localized sinus pain, or other symptoms, please follow-up with [place].  I hope you have a happy birthday and feel better soon!!

## 2020-11-12 ENCOUNTER — Telehealth: Payer: Self-pay | Admitting: Family Medicine

## 2020-11-12 MED ORDER — AMPHETAMINE-DEXTROAMPHETAMINE 10 MG PO TABS: 10.0000 mg | ORAL_TABLET | Freq: Every day | ORAL | 0 refills | Status: DC

## 2020-11-12 NOTE — Telephone Encounter (Signed)
Pt called and is requesting a refill on her adderall please send to [place], [place]

## 2020-11-19 ENCOUNTER — Other Ambulatory Visit: Payer: Self-pay | Admitting: Obstetrics and Gynecology

## 2020-11-19 DIAGNOSIS — Z803 Family history of malignant neoplasm of breast: Secondary | ICD-10-CM

## 2020-11-19 LAB — HM PAP SMEAR

## 2020-11-19 LAB — RESULTS CONSOLE HPV: CHL HPV: NEGATIVE

## 2020-12-01 ENCOUNTER — Telehealth: Payer: Self-pay | Admitting: Family Medicine

## 2020-12-01 NOTE — Telephone Encounter (Signed)
Pt called and wants to know if you are ok with filling her vyvanse early states she is out, she is not for sure if it the way the month ran,   Pt uses [place], [place]

## 2020-12-01 NOTE — Telephone Encounter (Signed)
Pt was advised KH 

## 2020-12-01 NOTE — Telephone Encounter (Signed)
She will have to wait till the 12th to pick it up

## 2020-12-07 ENCOUNTER — Ambulatory Visit: Payer: 59

## 2020-12-16 ENCOUNTER — Telehealth: Payer: Self-pay | Admitting: Internal Medicine

## 2020-12-16 MED ORDER — AMPHETAMINE-DEXTROAMPHETAMINE 10 MG PO TABS: 10.0000 mg | ORAL_TABLET | Freq: Every day | ORAL | 0 refills | Status: DC

## 2020-12-16 NOTE — Telephone Encounter (Signed)
Pt needs a refill on her adderall 10mg  to [facility]

## 2020-12-25 ENCOUNTER — Ambulatory Visit: Payer: 59

## 2020-12-26 DIAGNOSIS — N920 Excessive and frequent menstruation with regular cycle: Secondary | ICD-10-CM | POA: Diagnosis not present

## 2020-12-31 ENCOUNTER — Telehealth: Payer: Self-pay | Admitting: Family Medicine

## 2020-12-31 DIAGNOSIS — F902 Attention-deficit hyperactivity disorder, combined type: Secondary | ICD-10-CM

## 2020-12-31 MED ORDER — LISDEXAMFETAMINE DIMESYLATE 50 MG PO CAPS: 50.0000 mg | ORAL_CAPSULE | Freq: Every day | ORAL | 0 refills | Status: DC

## 2020-12-31 NOTE — Telephone Encounter (Signed)
Pt needs refill on Vyvanse sent to [facility]. She is going out of town on Friday and wants to see is she can get it filled for 2/11

## 2021-01-05 ENCOUNTER — Encounter (HOSPITAL_BASED_OUTPATIENT_CLINIC_OR_DEPARTMENT_OTHER): Payer: Self-pay | Admitting: Obstetrics and Gynecology

## 2021-01-05 ENCOUNTER — Telehealth: Payer: Self-pay | Admitting: Adult Health

## 2021-01-05 NOTE — Telephone Encounter (Signed)
[name] - The Pt is supposed to get an Ablasion on Thursday same say as her appt. She wanted to know if this is ok to have done due to her condition and wanted to verify with Janett Billow before she proceeds, if it is ok she will need to reschedule her appt for Thursday. Thank you

## 2021-01-05 NOTE — Telephone Encounter (Signed)
She is due to have procedure done 01-08-21 at [facility], she is scheduled (is ee in Epic) for D/C Ablation by Dr. Sandford Craze.

## 2021-01-05 NOTE — Telephone Encounter (Signed)
Okay to undergo procedure as scheduled on 2/17. Okay to reschedule our f/u visit for Thursday at this procedure would take priority over routine follow-up

## 2021-01-05 NOTE — Telephone Encounter (Signed)
Spoke to pt and she will have them fax clearance form to [phone].

## 2021-01-06 ENCOUNTER — Encounter (HOSPITAL_BASED_OUTPATIENT_CLINIC_OR_DEPARTMENT_OTHER): Payer: Self-pay | Admitting: Obstetrics and Gynecology

## 2021-01-06 NOTE — Progress Notes (Signed)
Spoke w/ via phone for pre-op interview--- Pt Lab needs dos-- CBC, Istat, EKG, Urine preg    Lab results------ no COVID test ------ 01-07-2021 @ 0805 Arrive at ------- 0600 on 01-08-2021 NPO after MN NO Solid Food.  Clear liquids from MN until--- 0500 Medications to take morning of surgery ----- NONE Diabetic medication ----- n/a Patient Special Instructions ----- n/a Pre-Op special Istructions ----- pt has telephone neurology clearance by Frann Rider NP dated 01-05-2021 in epic/ chart Patient verbalized understanding of instructions that were given at this phone interview. Patient denies shortness of breath, chest pain, fever, cough at this phone interview.   Anesthesia :  HTN;  Hx CVA w/ residual mild right ataxia/ occ vertigo in 02/ 2020.  Pt denies any stroke s&s.    PCP:  Dr Glo Herring Neurologist:  Dr Leonie Man (lov 07-08-2020 epic) EKG : 01-03-2019 epic Echo :  01-03-2019 epic Stress test: no Cardiac Cath :  no Activity level:  Denies sob w/ any activity Sleep Study/ CPAP :  NO  Blood Thinner/ Instructions /Last Dose:  NO ASA / Instructions/ Last Dose :  ASA 81mg /  Pt stated she was not told by Dr Melba Coon that she needed to stop for this surgery

## 2021-01-07 ENCOUNTER — Other Ambulatory Visit (HOSPITAL_COMMUNITY): Payer: 59

## 2021-01-07 DIAGNOSIS — N920 Excessive and frequent menstruation with regular cycle: Secondary | ICD-10-CM | POA: Diagnosis present

## 2021-01-07 DIAGNOSIS — I69393 Ataxia following cerebral infarction: Secondary | ICD-10-CM | POA: Diagnosis not present

## 2021-01-07 DIAGNOSIS — Z808 Family history of malignant neoplasm of other organs or systems: Secondary | ICD-10-CM | POA: Diagnosis not present

## 2021-01-07 DIAGNOSIS — Z20822 Contact with and (suspected) exposure to covid-19: Secondary | ICD-10-CM | POA: Diagnosis not present

## 2021-01-07 DIAGNOSIS — Z981 Arthrodesis status: Secondary | ICD-10-CM | POA: Diagnosis not present

## 2021-01-07 DIAGNOSIS — Z79899 Other long term (current) drug therapy: Secondary | ICD-10-CM | POA: Diagnosis not present

## 2021-01-07 DIAGNOSIS — Z7982 Long term (current) use of aspirin: Secondary | ICD-10-CM | POA: Diagnosis not present

## 2021-01-07 DIAGNOSIS — Z87891 Personal history of nicotine dependence: Secondary | ICD-10-CM | POA: Diagnosis not present

## 2021-01-07 DIAGNOSIS — Z8049 Family history of malignant neoplasm of other genital organs: Secondary | ICD-10-CM | POA: Diagnosis not present

## 2021-01-07 DIAGNOSIS — Z803 Family history of malignant neoplasm of breast: Secondary | ICD-10-CM | POA: Diagnosis not present

## 2021-01-07 DIAGNOSIS — Z87442 Personal history of urinary calculi: Secondary | ICD-10-CM | POA: Diagnosis not present

## 2021-01-07 DIAGNOSIS — Z8052 Family history of malignant neoplasm of bladder: Secondary | ICD-10-CM | POA: Diagnosis not present

## 2021-01-07 LAB — RESP PANEL BY RT-PCR (FLU A&B, COVID) ARPGX2
Influenza A by PCR: NEGATIVE
Influenza B by PCR: NEGATIVE
SARS Coronavirus 2 by RT PCR: NEGATIVE

## 2021-01-07 NOTE — H&P (Signed)
[name] is an 40 y.o. female [phone] with menorrhagia for hysteroscopy/endometrial ablation.  She complains of menorrhagia, has completed her childbearing.  D/W pt r/b/a and process/expectations.    Pertinent Gynecological History: OB History: G5, P4014 LTCS x 4, ectopic x 1 S/p B salpingectomy EMB neg Pap 12/21 - WNL HR HPV neg FH of breast cancer had genetic counseling   Menstrual History:  Patient's last menstrual period was 12/16/2020 (exact date).    Past Medical History:  Diagnosis Date  . ADHD (attention deficit hyperactivity disorder)   . 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 Family history of bladder cancer   . Family history of brain cancer   . Family history of breast cancer   . Family history of cervical cancer   . Family history of skin cancer   . 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 History of fusion of spine for scoliosis age 40  . History of kidney stones   . History of panic attacks   . Hx of varicella   . MDD (major depressive disorder)   . Vertigo, intermittent    residual from cva in 02/ 2020,  per pt happens when she lies down to quickly    Past Surgical History:  Procedure Laterality Date  . BILATERAL SALPINGECTOMY Bilateral 05/05/2016   Procedure: BILATERAL SALPINGECTOMY;  Surgeon: Janyth Contes, MD;  Location: [place];  Service: Obstetrics;  Laterality: Bilateral;  . CARPAL TUNNEL RELEASE Bilateral 2016  approx.  . CESAREAN SECTION N/A 05/05/2016   Procedure: CESAREAN SECTION;  Surgeon: Janyth Contes, MD;  Location: [place];  Service: Obstetrics;   Laterality: N/A;  . CESAREAN SECTION  01/ 2005; 10; 2007; 02; 2010  . CYSTOSCOPY/URETEROSCOPY/HOLMIUM LASER/STENT PLACEMENT  08-01-2007  _0   . INGUINAL HERNIA REPAIR Right 07-15-2009 _1   . LAPAROSCOPIC CHOLECYSTECTOMY  08-18-2009  _2   . MUSCLE RECESSION AND RESECTION Right 10/2019   correct right eye diplopia (residual from cva)  . SPINAL FIXATION SURGERY W/ IMPLANT  age 40   scoliosis    Family History  Problem Relation Age of Onset  . Breast cancer Mother 36       bilateral, BRCA reportedly neg  . Breast cancer Maternal Aunt        dx. early 6s, BRCA reportedly neg  . Breast cancer Maternal Grandmother        dx. 22s  . Bladder Cancer Maternal Grandfather 86       non-smoker  . Cervical cancer Paternal Grandmother 1  . Skin cancer Paternal Grandfather   . Brain cancer Other        paternal grandmother's sister  . Cancer Other        unknown type or age of diagnosis; maternal grandfather's brothers    Social History:  reports that she quit smoking about 19 years ago. Her smoking use included cigarettes. She quit after 3.00 years of use. She has never used smokeless tobacco. She reports that she does not drink alcohol and does not use drugs.married  Allergies: No Known Allergies  Meds: ASA, Adderall, losartan, HCTZ,sertraline, vitamin D    Review of Systems  HENT:  Negative.   Eyes: Negative.   Respiratory: Negative.   Cardiovascular: Negative.   Gastrointestinal: Negative.   Genitourinary: Negative.   Musculoskeletal: Negative.   Neurological: Negative.   Psychiatric/Behavioral: Negative.     Last menstrual period 12/16/2020. Physical Exam Constitutional:      Appearance: Normal appearance.  HENT:     Head: Normocephalic and atraumatic.  Cardiovascular:     Rate and Rhythm: Normal rate and regular rhythm.  Pulmonary:     Effort: Pulmonary effort is normal.     Breath sounds: Normal breath sounds.  Abdominal:     General: Bowel sounds are normal.      Palpations: Abdomen is soft.  Musculoskeletal:        General: Normal range of motion.     Cervical back: Normal range of motion.  Skin:    General: Skin is warm and dry.  Neurological:     General: No focal deficit present.     Mental Status: She is alert and oriented to person, place, and time.  Psychiatric:        Mood and Affect: Mood normal.        Behavior: Behavior normal.   EMB benign  SIUS - nl cavity, isthmocoele noted   Assessment/Plan: 40yo [id number] w menorrhagia for hysteroscopy/endometrial ablation D/w pt r/b/a will proceed  [name] 01/07/2021, 10:17 AM

## 2021-01-07 NOTE — Anesthesia Preprocedure Evaluation (Addendum)
Anesthesia Evaluation  Patient identified by MRN, date of birth, ID band Patient awake    Reviewed: Allergy & Precautions, NPO status , Patient's Chart, lab work & pertinent test results  Airway Mallampati: II  TM Distance: >3 FB Neck ROM: Full    Dental no notable dental hx.    Pulmonary former smoker,  Quit smoking 2003   Pulmonary exam normal breath sounds clear to auscultation       Cardiovascular hypertension, Pt. on medications Normal cardiovascular exam Rhythm:Regular Rate:Normal     Neuro/Psych PSYCHIATRIC DISORDERS Anxiety Depression vertigo CVA (12/2018 cerebral infarct- residual R sided weakness, ataxia)    GI/Hepatic negative GI ROS, Neg liver ROS,   Endo/Other  negative endocrine ROS  Renal/GU negative Renal ROS  Female GU complaint (menorrhagia)     Musculoskeletal Hx scoliosis surgery at 40yo   Abdominal   Peds  Hematology negative hematology ROS (+)   Anesthesia Other Findings   Reproductive/Obstetrics negative OB ROS                            Anesthesia Physical Anesthesia Plan  ASA: III  Anesthesia Plan: General   Post-op Pain Management:    Induction: Intravenous  PONV Risk Score and Plan: 3 and Ondansetron, Dexamethasone, Midazolam and Treatment may vary due to age or medical condition  Airway Management Planned: LMA  Additional Equipment: None  Intra-op Plan:   Post-operative Plan: Extubation in OR  Informed Consent: I have reviewed the patients History and Physical, chart, labs and discussed the procedure including the risks, benefits and alternatives for the proposed anesthesia with the patient or authorized representative who has indicated his/her understanding and acceptance.     Dental advisory given  Plan Discussed with: CRNA  Anesthesia Plan Comments:        Anesthesia Quick Evaluation

## 2021-01-08 ENCOUNTER — Encounter (HOSPITAL_BASED_OUTPATIENT_CLINIC_OR_DEPARTMENT_OTHER): Payer: Self-pay | Admitting: Obstetrics and Gynecology

## 2021-01-08 ENCOUNTER — Other Ambulatory Visit: Payer: Self-pay

## 2021-01-08 ENCOUNTER — Encounter (HOSPITAL_BASED_OUTPATIENT_CLINIC_OR_DEPARTMENT_OTHER)
Admission: RE | Disposition: A | Discharge status: Home / Self Care | Payer: Self-pay | Source: Home / Self Care | Attending: Obstetrics and Gynecology

## 2021-01-08 ENCOUNTER — Ambulatory Visit (HOSPITAL_BASED_OUTPATIENT_CLINIC_OR_DEPARTMENT_OTHER): Payer: 59 | Admitting: Anesthesiology

## 2021-01-08 ENCOUNTER — Ambulatory Visit (HOSPITAL_BASED_OUTPATIENT_CLINIC_OR_DEPARTMENT_OTHER)
Admission: RE | Admit: 2021-01-08 | Discharge: 2021-01-08 | Disposition: A | Discharge status: Home / Self Care | Payer: 59 | Attending: Obstetrics and Gynecology | Admitting: Obstetrics and Gynecology

## 2021-01-08 ENCOUNTER — Ambulatory Visit: Payer: Medicaid Other | Admitting: Adult Health

## 2021-01-08 DIAGNOSIS — I69393 Ataxia following cerebral infarction: Secondary | ICD-10-CM | POA: Diagnosis not present

## 2021-01-08 DIAGNOSIS — I10 Essential (primary) hypertension: Secondary | ICD-10-CM | POA: Diagnosis not present

## 2021-01-08 DIAGNOSIS — Z20822 Contact with and (suspected) exposure to covid-19: Secondary | ICD-10-CM | POA: Insufficient documentation

## 2021-01-08 DIAGNOSIS — Z981 Arthrodesis status: Secondary | ICD-10-CM | POA: Insufficient documentation

## 2021-01-08 DIAGNOSIS — N920 Excessive and frequent menstruation with regular cycle: Secondary | ICD-10-CM | POA: Insufficient documentation

## 2021-01-08 DIAGNOSIS — Z87442 Personal history of urinary calculi: Secondary | ICD-10-CM | POA: Insufficient documentation

## 2021-01-08 DIAGNOSIS — Z803 Family history of malignant neoplasm of breast: Secondary | ICD-10-CM | POA: Diagnosis not present

## 2021-01-08 DIAGNOSIS — Z8052 Family history of malignant neoplasm of bladder: Secondary | ICD-10-CM | POA: Diagnosis not present

## 2021-01-08 DIAGNOSIS — Z9889 Other specified postprocedural states: Secondary | ICD-10-CM

## 2021-01-08 DIAGNOSIS — Z87891 Personal history of nicotine dependence: Secondary | ICD-10-CM | POA: Insufficient documentation

## 2021-01-08 DIAGNOSIS — Z79899 Other long term (current) drug therapy: Secondary | ICD-10-CM | POA: Diagnosis not present

## 2021-01-08 DIAGNOSIS — Z8049 Family history of malignant neoplasm of other genital organs: Secondary | ICD-10-CM | POA: Insufficient documentation

## 2021-01-08 DIAGNOSIS — Z808 Family history of malignant neoplasm of other organs or systems: Secondary | ICD-10-CM | POA: Insufficient documentation

## 2021-01-08 DIAGNOSIS — Z7982 Long term (current) use of aspirin: Secondary | ICD-10-CM | POA: Insufficient documentation

## 2021-01-08 DIAGNOSIS — F909 Attention-deficit hyperactivity disorder, unspecified type: Secondary | ICD-10-CM | POA: Diagnosis not present

## 2021-01-08 DIAGNOSIS — E559 Vitamin D deficiency, unspecified: Secondary | ICD-10-CM | POA: Diagnosis not present

## 2021-01-08 HISTORY — DX: Personal history of other diseases of the musculoskeletal system and connective tissue: Z87.39

## 2021-01-08 HISTORY — DX: Major depressive disorder, single episode, unspecified: F32.9

## 2021-01-08 HISTORY — DX: Other specified postprocedural states: Z98.890

## 2021-01-08 HISTORY — DX: Personal history of urinary calculi: Z87.442

## 2021-01-08 HISTORY — DX: Attention-deficit hyperactivity disorder, unspecified type: F90.9

## 2021-01-08 HISTORY — DX: Ataxia following cerebral infarction: I69.393

## 2021-01-08 HISTORY — DX: Dizziness and giddiness: R42

## 2021-01-08 HISTORY — PX: DILITATION & CURRETTAGE/HYSTROSCOPY WITH NOVASURE ABLATION: SHX5568

## 2021-01-08 HISTORY — DX: Personal history of other mental and behavioral disorders: Z86.59

## 2021-01-08 LAB — POCT PREGNANCY, URINE: Preg Test, Ur: NEGATIVE

## 2021-01-08 LAB — POCT I-STAT, CHEM 8
BUN: 14 mg/dL (ref 6–20)
Calcium, Ion: 1.17 mmol/L (ref 1.15–1.40)
Chloride: 108 mmol/L (ref 98–111)
Creatinine, Ser: 0.5 mg/dL (ref 0.44–1.00)
Glucose, Bld: 97 mg/dL (ref 70–99)
HCT: 39 % (ref 36.0–46.0)
Hemoglobin: 13.3 g/dL (ref 12.0–15.0)
Potassium: 3.7 mmol/L (ref 3.5–5.1)
Sodium: 140 mmol/L (ref 135–145)
TCO2: 22 mmol/L (ref 22–32)

## 2021-01-08 LAB — CBC
HCT: 40 % (ref 36.0–46.0)
Hemoglobin: 13.7 g/dL (ref 12.0–15.0)
MCH: 30.4 pg (ref 26.0–34.0)
MCHC: 34.3 g/dL (ref 30.0–36.0)
MCV: 88.7 fL (ref 80.0–100.0)
Platelets: 270 10*3/uL (ref 150–400)
RBC: 4.51 MIL/uL (ref 3.87–5.11)
RDW: 13.1 % (ref 11.5–15.5)
WBC: 6.8 10*3/uL (ref 4.0–10.5)
nRBC: 0 % (ref 0.0–0.2)

## 2021-01-08 SURGERY — DILATATION & CURETTAGE/HYSTEROSCOPY WITH NOVASURE ABLATION
Anesthesia: General | Site: Vagina

## 2021-01-08 MED ORDER — ONDANSETRON HCL 4 MG/2ML IJ SOLN
INTRAMUSCULAR | Status: DC | PRN
  Administered 2021-01-08: 4 mg via INTRAVENOUS

## 2021-01-08 MED ORDER — LIDOCAINE HCL 1 % IJ SOLN
INTRAMUSCULAR | Status: DC | PRN
  Administered 2021-01-08: 10 mL

## 2021-01-08 MED ORDER — WHITE PETROLATUM EX OINT
TOPICAL_OINTMENT | CUTANEOUS | Status: AC
  Filled 2021-01-08: qty 5

## 2021-01-08 MED ORDER — PROPOFOL 10 MG/ML IV BOLUS
INTRAVENOUS | Status: DC | PRN
Start: 2021-01-08 — End: 2021-01-08
  Administered 2021-01-08: 200 mg via INTRAVENOUS

## 2021-01-08 MED ORDER — OXYCODONE HCL 5 MG PO TABS: 5.0000 mg | ORAL_TABLET | Freq: Once | ORAL | Status: DC | PRN

## 2021-01-08 MED ORDER — OXYCODONE HCL 5 MG PO TABS: 5.0000 mg | ORAL_TABLET | Freq: Four times a day (QID) | ORAL | 0 refills | Status: DC | PRN

## 2021-01-08 MED ORDER — DEXAMETHASONE SODIUM PHOSPHATE 10 MG/ML IJ SOLN
INTRAMUSCULAR | Status: DC | PRN
  Administered 2021-01-08 (×2): 5 mg via INTRAVENOUS

## 2021-01-08 MED ORDER — DEXAMETHASONE SODIUM PHOSPHATE 10 MG/ML IJ SOLN
INTRAMUSCULAR | Status: AC
  Filled 2021-01-08: qty 1

## 2021-01-08 MED ORDER — SILVER NITRATE-POT NITRATE 75-25 % EX MISC
CUTANEOUS | Status: DC | PRN
  Administered 2021-01-08: 3

## 2021-01-08 MED ORDER — KETOROLAC TROMETHAMINE 30 MG/ML IJ SOLN: 30.0000 mg | Freq: Once | INTRAMUSCULAR | Status: DC | PRN

## 2021-01-08 MED ORDER — MIDAZOLAM HCL 2 MG/2ML IJ SOLN
INTRAMUSCULAR | Status: DC | PRN
  Administered 2021-01-08: 2 mg via INTRAVENOUS

## 2021-01-08 MED ORDER — HYDROMORPHONE HCL 1 MG/ML IJ SOLN: 0.2500 mg | INTRAMUSCULAR | Status: DC | PRN

## 2021-01-08 MED ORDER — KETOROLAC TROMETHAMINE 30 MG/ML IJ SOLN
INTRAMUSCULAR | Status: DC | PRN
  Administered 2021-01-08: 30 mg via INTRAVENOUS

## 2021-01-08 MED ORDER — FENTANYL CITRATE (PF) 100 MCG/2ML IJ SOLN
INTRAMUSCULAR | Status: DC | PRN
  Administered 2021-01-08: 50 ug via INTRAVENOUS
  Administered 2021-01-08 (×2): 25 ug via INTRAVENOUS

## 2021-01-08 MED ORDER — LACTATED RINGERS IV SOLN: INTRAVENOUS | Status: DC

## 2021-01-08 MED ORDER — MIDAZOLAM HCL 2 MG/2ML IJ SOLN
INTRAMUSCULAR | Status: AC
  Filled 2021-01-08: qty 2

## 2021-01-08 MED ORDER — POVIDONE-IODINE 10 % EX SWAB: 2.0000 "application " | Freq: Once | CUTANEOUS | Status: DC

## 2021-01-08 MED ORDER — LIDOCAINE HCL (PF) 2 % IJ SOLN
INTRAMUSCULAR | Status: AC
  Filled 2021-01-08: qty 5

## 2021-01-08 MED ORDER — LIDOCAINE 2% (20 MG/ML) 5 ML SYRINGE
INTRAMUSCULAR | Status: DC | PRN
  Administered 2021-01-08: 60 mg via INTRAVENOUS

## 2021-01-08 MED ORDER — PROMETHAZINE HCL 25 MG/ML IJ SOLN: 6.2500 mg | INTRAMUSCULAR | Status: DC | PRN

## 2021-01-08 MED ORDER — KETOROLAC TROMETHAMINE 30 MG/ML IJ SOLN
INTRAMUSCULAR | Status: AC
  Filled 2021-01-08: qty 1

## 2021-01-08 MED ORDER — PROPOFOL 10 MG/ML IV BOLUS
INTRAVENOUS | Status: AC
  Filled 2021-01-08: qty 40

## 2021-01-08 MED ORDER — MEPERIDINE HCL 25 MG/ML IJ SOLN: 6.2500 mg | INTRAMUSCULAR | Status: DC | PRN

## 2021-01-08 MED ORDER — ACETAMINOPHEN 500 MG PO TABS
ORAL_TABLET | ORAL | Status: AC
  Filled 2021-01-08: qty 2

## 2021-01-08 MED ORDER — OXYCODONE HCL 5 MG/5ML PO SOLN: 5.0000 mg | Freq: Once | ORAL | Status: DC | PRN

## 2021-01-08 MED ORDER — SODIUM CHLORIDE 0.9 % IR SOLN
Status: DC | PRN
  Administered 2021-01-08: 500 mL

## 2021-01-08 MED ORDER — ONDANSETRON HCL 4 MG/2ML IJ SOLN
INTRAMUSCULAR | Status: AC
  Filled 2021-01-08: qty 2

## 2021-01-08 MED ORDER — IBUPROFEN 800 MG PO TABS: 800.0000 mg | ORAL_TABLET | Freq: Three times a day (TID) | ORAL | 1 refills | Status: DC | PRN

## 2021-01-08 MED ORDER — FENTANYL CITRATE (PF) 100 MCG/2ML IJ SOLN
INTRAMUSCULAR | Status: AC
  Filled 2021-01-08: qty 2

## 2021-01-08 MED ORDER — ACETAMINOPHEN 500 MG PO TABS
1000.0000 mg | ORAL_TABLET | Freq: Once | ORAL | Status: AC
  Administered 2021-01-08: 1000 mg via ORAL

## 2021-01-08 SURGICAL SUPPLY — 18 items
ABLATOR SURESOUND NOVASURE (ABLATOR) ×1 IMPLANT
CANISTER SUCT 3000ML PPV (MISCELLANEOUS) ×1 IMPLANT
CATH ROBINSON RED A/P 16FR (CATHETERS) ×2 IMPLANT
COVER WAND RF STERILE (DRAPES) ×2 IMPLANT
DILATOR CANAL MILEX (MISCELLANEOUS) ×1 IMPLANT
GAUZE 4X4 16PLY RFD (DISPOSABLE) ×2 IMPLANT
GLOVE SURG ENC MOIS LTX SZ6.5 (GLOVE) ×2 IMPLANT
GOWN STRL REUS W/TWL LRG LVL3 (GOWN DISPOSABLE) ×2 IMPLANT
IV NS IRRIG 3000ML ARTHROMATIC (IV SOLUTION) ×2 IMPLANT
KIT PROCEDURE FLUENT (KITS) ×2 IMPLANT
KIT TURNOVER CYSTO (KITS) ×2 IMPLANT
PACK VAGINAL MINOR WOMEN LF (CUSTOM PROCEDURE TRAY) ×2 IMPLANT
PAD OB MATERNITY 4.3X12.25 (PERSONAL CARE ITEMS) ×2 IMPLANT
SEAL CERVICAL OMNI LOK (ABLATOR) IMPLANT
SEAL ROD LENS SCOPE MYOSURE (ABLATOR) IMPLANT
SUT VIC AB 3-0 SH 27 (SUTURE) ×2
SUT VIC AB 3-0 SH 27X BRD (SUTURE) IMPLANT
TOWEL OR 17X26 10 PK STRL BLUE (TOWEL DISPOSABLE) ×2 IMPLANT

## 2021-01-08 NOTE — Anesthesia Procedure Notes (Signed)
Procedure Name: LMA Insertion Date/Time: 01/08/2021 7:51 AM Performed by: Suan Halter, CRNA Pre-anesthesia Checklist: Patient identified, Emergency Drugs available, Suction available and Patient being monitored Patient Re-evaluated:Patient Re-evaluated prior to induction Oxygen Delivery Method: Circle system utilized Preoxygenation: Pre-oxygenation with 100% oxygen Induction Type: IV induction Ventilation: Mask ventilation without difficulty LMA: LMA inserted LMA Size: 4.0 Number of attempts: 1 Airway Equipment and Method: Bite block Placement Confirmation: positive ETCO2 Tube secured with: Tape Dental Injury: Teeth and Oropharynx as per pre-operative assessment

## 2021-01-08 NOTE — Discharge Instructions (Signed)
DISCHARGE INSTRUCTIONS: D&C / D&E The following instructions have been prepared to help you care for yourself upon your return home.   Personal hygiene: Marland Kitchen Use sanitary pads for vaginal drainage, not tampons. . Shower the day after your procedure. . NO tub baths, pools or Jacuzzis for 2-3 weeks. . Wipe front to back after using the bathroom.  Activity and limitations: . Do NOT drive or operate any equipment for 24 hours. The effects of anesthesia are still present and drowsiness may result. . Do NOT rest in bed all day. . Walking is encouraged. . Walk up and down stairs slowly. . You may resume your normal activity in one to two days or as indicated by your physician.  Sexual activity: NO intercourse for at least 2 weeks after the procedure, or as indicated by your physician.  Diet: Eat a light meal as desired this evening. You may resume your usual diet tomorrow.  Return to work: You may resume your work activities in one to two days or as indicated by your doctor.  What to expect after your surgery: Expect to have vaginal bleeding/discharge for 2-3 days and spotting for up to 10 days. It is not unusual to have soreness for up to 1-2 weeks. You may have a slight burning sensation when you urinate for the first day. Mild cramps may continue for a couple of days. You may have a regular period in 2-6 weeks.  Call your doctor for any of the following: . Excessive vaginal bleeding, saturating and changing one pad every hour. . Inability to urinate 6 hours after discharge from hospital. . Pain not relieved by pain medication. . Fever of 100.4 F or greater. . Unusual vaginal discharge or odor.      Post Anesthesia Home Care Instructions  Activity: Get plenty of rest for the remainder of the day. A responsible individual must stay with you for 24 hours following the procedure.  For the next 24 hours, DO NOT: -Drive a car -Paediatric nurse -Drink alcoholic beverages -Take any  medication unless instructed by your physician -Make any legal decisions or sign important papers.  Meals: Start with liquid foods such as gelatin or soup. Progress to regular foods as tolerated. Avoid greasy, spicy, heavy foods. If nausea and/or vomiting occur, drink only clear liquids until the nausea and/or vomiting subsides. Call your physician if vomiting continues.  Special Instructions/Symptoms: Your throat may feel dry or sore from the anesthesia or the breathing tube placed in your throat during surgery. If this causes discomfort, gargle with warm salt water. The discomfort should disappear within 24 hours.    Next dose of Tylenol after 12:30 pm today.

## 2021-01-08 NOTE — Progress Notes (Deleted)
[facility] [address] [place]. [place] [address] [phone]       STROKE FOLLOW UP NOTE  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   HPI:   Today, 07/08/2020, [name] returns for stroke follow-up.  Residual deficits right-sided ataxia with gait impairment and occasional dizziness Does report recent fall after attempting to pick up a basketball and had difficulty controlling right leg.  Hip soreness but otherwise no injury.  Denies hitting head. Has difficulty writing due to RUE ataxia and has been trying to learn to write left-handed C/o left knee pain and questions if this is due to gait abnormality Limited activity tolerance due to RLE fatigue with increased exertion Occasional dizziness but overall greatly improving No residual visual deficits Denies new or worsening stroke/TIA symptoms  Remains on aspirin 81 mg daily without bleeding or bruising Blood pressure today 121/82 - continues on Hyzaar managed by PCP  No further concerns at this time      History provided for reference purposes only Update 01/08/2020 JM: [name] is a 40 year old female who is being seen today for stroke follow-up.  Residual deficits of occasional dizziness sensation while laying down and hyperextending neck but otherwise denies any type of vertigo dizziness symptoms.  Vision has greatly improved since undergoing eye procedure with only occasional symptoms of diplopia but otherwise doing great.  Repeat CTA head/neck showed resolution of prior dissection and recanalized right V3 segment with normal caliber but did show continued 50% right P2 segment narrowing due to uncovertebral spur.  Continues on aspirin 325 mg daily without bleeding or bruising.  Blood pressure today 118/78. No further concerns at this time.    Update 09/01/2019 (virtual visit) JM: [name] is being seen today for stroke follow-up and clearance to undergo surgical eye procedure with Dr. Frederico Hamman [facility]).  Residual deficits of occasional dizziness and mild decreased RUE coordination/ataxia but improvement. Continues to have diplopia but optho believes due to right eye higher than left  Plans on undergoing procedure that will correct muscles of right eye due to ongoing diplopia. At prior visit, recommended continuation of aspirin 325 mg daily but [name] has discontinued this approximately 3 months ago as [name] did not think it was necessary to continue.  No repeat imaging has been done at this time due to prior visit imaging limited due to emergent needs only due to pandemic restrictions.  It was recommended to follow-up in 4 months but unfortunately that did not happen.  Blood pressure check during virtual visit at 131/86.  Typically SBP 120-130.  Continues on valsartan-hydrochlorothiazide daily.  No further concerns at this time.   Virtual visit 02/22/2019 JM: [name] reports since being home [name] has been doing well with continued residual deficits of right hemiparesis and visual deficits. [name] has returned back to all prior activites but does need moderate assistance with cooking due to right arm weakness. [name] was participating in therapy thru neuro rehab but due to pandemic, this has been postponed.  [name] has a difficult time explaining her visual concerns but per what [name] reports, appears to be diplopia right eye greater than left eye.  Does not appear to have difficulties with peripheral vision.  [name] states that when [name] attempts to read something or watch TV, [name] will close her right  eye and is able to see much clear throughout her left.  [name] plans on calling neuro-ophthalmology Dr. Frederico Hamman which was recommended during CIR.  [name] does endorse occasional dizziness only when [name] tilts her head up but will resolve once in the neutral position.  [name]  denies any other periods of dizziness or balance difficulties.  [name] denies neck pain or headaches. [name] continues on aspirin 365m daily without side effects of bleeding or bruising.  [name] does monitor BP at home and was recently started on hydrochlorothiazide by her PCP on 02/05/2019 with BP typically ranging 130s/90s.  [name] did check BP during visit which resulted in 141/99 and heart rate 87.  [name] does endorses being typically higher than what [name] has been obtaining prior and feels as though it is due to increased anxiety with virtual visit along with attempting to care for her 2 children during the visit as her husband is currently working.  No further concerns at this time.  Denies new or worsening stroke/TIA symptoms.  Stroke admission 01/04/2019: Tonya CroyScottis a 40y.o.femalepast medical history of ADD and panic attacks, [facility]. According to the husband, [name] had been complaining of days to weeks of right-sided neck pain. No other focal deficits were noted by the family. The morning of admission, [name] woke up with a bad headache and neck pain along with feelings of anxiety. When attempts were made in the ED to have her ambulate, [name] was very wobbly and ataxic. That evening, a CT head obtained due to lethargy and ongoing complaints of dizziness. CT head reviewed and showed a evolving acute ischemic right cerebellar and bilateral PCA territory infarcts with involvement of bilateral occipital lobes and thalami along with trace petechial hemorrhage at the occipital poles without frank hemorrhagic transformation. There was associated mass-effect within the right posterior fossa with partial effacement of the right basilar cisterns and fourth ventricle but no obstructive hydrocephalus at the time. CTA head and neck showed acute right vertebral artery dissection involving the right V3 segment at the level of the right C1 transverse foramen with  probable small dissection flap just distally at the right V3 V4 junction with associated moderate stenosis. There is also probable distal bilateral P3 P4 occlusions possibly thromboembolic in nature. No stenosis or occlusions in the anterior circulation. Denies any neck trauma, manipulation or accidents. MRI brain reviewed and showed multifocal right cerebellar infarction relfecting R VA (B thalamic, left temporal lobe and right occipital lobe reflecting thalamoperforate and B PCA) with significant mass effect on fourth ventricle without hydrocephalus. 2D echo showed EF of 60-65% without cardiac source of embolus.  LDL 88 and A1c 5.7. Other stroke risk factors include obesity but no prior history of stroke. [name] was discharged to CIR in stable condition on 01/03/19 and discharged home on 01/16/19 with outpatient therapy recommendations.   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 Mass effect R post fossa w/ partial effacement R basilar cisterns and 4th ventricle  CTA head & neck R VA dissction R V3 w/ flap distal R V3/4. probabl distal B P3/4 occlusions  F/u CT unchanged post circ infarcts. Unchanged edema w/o hydro  MRI Multifocal R cerebellar infarction reflecting R VA (B  thalami, LEFT temporal lobe, and RIGHT occipital lobe reflecting thalamoperforate and B PCA) Significant mass effect on fourth ventricle without hydrocephalus  2D Echo EF 60-65%. No source of embolus   LDL88  HgbA1c5.7  SCDs for VTE prophylaxis  No antithromboticprior to admission, now on No antithrombotic. Given dissection, added aspirin 325 mg daily  Therapy recommendations: CIR  Disposition: CIR   ROS:   14 system review of systems performed and negative with exception of see  HPI  PMH:  Past Medical History:  Diagnosis Date  . ADHD (attention deficit hyperactivity disorder)   . 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 Family history of bladder cancer   . Family history of brain cancer   . Family history of breast cancer   . Family history of cervical cancer   . Family history of skin cancer   . 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 History of fusion of spine for scoliosis age 63  . History of kidney stones   . History of panic attacks   . Hx of varicella   . MDD (major depressive disorder)   . Vertigo, intermittent    residual from cva in 02/ 2020,  per pt happens when [name] lies down to quickly    PSH:  Past Surgical History:  Procedure Laterality Date  . BILATERAL SALPINGECTOMY Bilateral 05/05/2016   Procedure: BILATERAL SALPINGECTOMY;  Surgeon: Janyth Contes, MD;  Location: [place];  Service: Obstetrics;  Laterality: Bilateral;  . CARPAL TUNNEL RELEASE Bilateral 2016  approx.  . CESAREAN SECTION N/A 05/05/2016   Procedure: CESAREAN SECTION;  Surgeon: Janyth Contes, MD;  Location: [place];  Service: Obstetrics;  Laterality: N/A;  . CESAREAN SECTION  01/ 2005; 10; 2007; 02; 2010  . CYSTOSCOPY/URETEROSCOPY/HOLMIUM LASER/STENT PLACEMENT  08-01-2007  '@[facility]'   . INGUINAL HERNIA REPAIR Right 07-15-2009 '@[facility]'   . LAPAROSCOPIC CHOLECYSTECTOMY  08-18-2009  '@[facility]'   . MUSCLE RECESSION AND RESECTION Right 10/2019   correct right eye diplopia (residual from cva)  . SPINAL FIXATION SURGERY W/ IMPLANT  age 60   scoliosis    Social History:  Social History   Socioeconomic  History  . Marital status: Married    Spouse name: Not on file  . Number of children: Not on file  . Years of education: Not on file  . Highest education level: Not on file  Occupational History  . Not on file  Tobacco Use  . Smoking status: Former Smoker    Years: 3.00    Types: Cigarettes    Quit date: 01/06/2002    Years since quitting: 19.0  . Smokeless tobacco: Never Used  Vaping Use  . Vaping Use: Never used  Substance and Sexual Activity  . Alcohol use: No    Comment: occasional wine  . Drug use: No  . Sexual activity: Yes    Partners: Male    Birth control/protection: Surgical  Other Topics Concern  . Not on file  Social History Narrative  . Not on file   Social Determinants of Health   Financial Resource Strain: Not on file  Food Insecurity: Not on file  Transportation Needs: Not on  file  Physical Activity: Not on file  Stress: Not on file  Social Connections: Not on file  Intimate Partner Violence: Not on file    Family History:  Family History  Problem Relation Age of Onset  . Breast cancer Mother 19       bilateral, BRCA reportedly neg  . Breast cancer Maternal Aunt        dx. early 64s, BRCA reportedly neg  . Breast cancer Maternal Grandmother        dx. 35s  . Bladder Cancer Maternal Grandfather 86       non-smoker  . Cervical cancer Paternal Grandmother 15  . Skin cancer Paternal Grandfather   . Brain cancer Other        paternal grandmother's sister  . Cancer Other        unknown type or age of diagnosis; maternal grandfather's brothers    Medications:   Current Facility-Administered Medications on File Prior to Visit  Medication Dose Route Frequency Provider Last Rate Last Admin  . lactated ringers infusion   Intravenous Continuous Bovard-Stuckert, Jody, MD 125 mL/hr at 01/08/21 0640 New Bag at 01/08/21 0640  . lactated ringers infusion   Intravenous Continuous Stoltzfus, Gregory P, DO      . povidone-iodine 10 % swab 2 application  2  application Topical Once Bovard-Stuckert, Jody, MD       Current Outpatient Medications on File Prior to Visit  Medication Sig Dispense Refill  . amphetamine-dextroamphetamine (ADDERALL) 10 MG tablet Take 1 tablet (10 mg total) by mouth daily. 30 tablet 0  . aspirin EC 81 MG tablet Take 1 tablet (81 mg total) by mouth daily.    Derrill Memo ON 03/03/2021] lisdexamfetamine (VYVANSE) 50 MG capsule Take 1 capsule (50 mg total) by mouth daily. 30 capsule 0  . [START ON 01/31/2021] lisdexamfetamine (VYVANSE) 50 MG capsule Take 1 capsule (50 mg total) by mouth daily. 30 capsule 0  . lisdexamfetamine (VYVANSE) 50 MG capsule Take 1 capsule (50 mg total) by mouth daily. 30 capsule 0  . losartan-hydrochlorothiazide (HYZAAR) 50-12.5 MG tablet Take 1 tablet by mouth daily. 90 tablet 3  . sertraline (ZOLOFT) 50 MG tablet Take 1 tablet (50 mg total) by mouth daily. (Patient taking differently: Take 50 mg by mouth at bedtime.) 90 tablet 3    Allergies:  No Known Allergies   Physical Exam  There were no vitals filed for this visit.  General: well developed, well nourished, very pleasant middle-age Caucasian female, seated, in no evident distress Neck: supple with no carotid or supraclavicular bruits Cardiovascular: regular rate and rhythm, no murmurs Vascular:  Normal pulses all extremities   Neurologic Exam Mental Status: Awake and fully alert.   Fluent speech and language.  Oriented to place and time. Recent and remote memory intact. Attention span, concentration and fund of knowledge appropriate. Mood and affect appropriate.  Cranial Nerves: Pupils equal, briskly reactive to light. Extraocular movements show saccadic dysmetria. Visual fields full to confrontation. Hearing intact. Facial sensation intact. Face, tongue, palate moves normally and symmetrically.  Motor: Normal bulk and tone. Normal strength in all tested extremity muscles. Sensory.: intact to touch , pinprick , position and vibratory  sensation.  Coordination: Rapid alternating movements normal in all extremities except decreased right hand dexterity. Finger-to-nose and heel-to-shin performed right-sided ataxia UE>LE.  Gait and Station: Arises from chair without difficulty. Stance is normal.  Ataxic gait with exaggerated right leg lift and mild hyperextension of left knee.  Able to ambulate  without assistive device. Able to tandem with mild difficulty. difficulty standing on right leg alone. Reflexes: 1+ and symmetric. Toes downgoing.         ASSESSMENT: [name] is a 40 y.o. 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 Vascular risk factors include obesity.  [name] did undergo ophthalmology procedure with improvement of diplopia.  Repeat CTA showed resolution of prior dissection.    PLAN:  1. R PCA infarct :  a. Residual deficits: Right-sided ataxia and occasional dizziness.  Gait abnormality likely contributing to left knee pain and increased dependence on left leg.  Referral placed to neuro rehab PT for compensation strategies and re-eval of current gait pattern.  May potentially benefit from OT for RUE ataxia and compensation strategies but [name] would like to hold off at this time.   b. Continue aspirin 81 mg daily for secondary stroke prevention.  c. Ensure close PCP follow-up for aggressive stroke risk factor management. 2. HTN:  a. BP goal<130/90 b. Continue Hyzaar managed and monitored by PCP 3. R VA dissection a. Resolved b. No further intervention required    Follow up in 6 months or call earlier if needed   I spent 30 minutes of face-to-face and non-face-to-face time with patient.  This included previsit chart review, lab review, study review, order entry, electronic health record documentation, patient education regarding residual stroke deficits and reevaluation by PT as well as exercises at home, history of prior  stroke and managing stroke risk factors and answered all questions to patient satisfaction     Frann Rider, [facility]  [facility] [address] [place], [place] [address]  Phone [phone] Fax [phone] Note: This document was prepared with digital dictation and possible smart phrase technology. Any transcriptional errors that result from this process are unintentional.

## 2021-01-08 NOTE — Telephone Encounter (Addendum)
Faxed for pt to [facility]. Confirmation pending. Pt has surgery today.

## 2021-01-08 NOTE — Anesthesia Postprocedure Evaluation (Signed)
Anesthesia Post Note  Patient: [name]  Procedure(s) Performed: DILATATION & CURETTAGE/HYSTEROSCOPY WITH NOVASURE ABLATION (N/A Vagina )     Patient location during evaluation: PACU Anesthesia Type: General Level of consciousness: awake and alert, oriented and patient cooperative Pain management: pain level controlled Vital Signs Assessment: post-procedure vital signs reviewed and stable Respiratory status: spontaneous breathing, nonlabored ventilation and respiratory function stable Cardiovascular status: blood pressure returned to baseline and stable Postop Assessment: no apparent nausea or vomiting Anesthetic complications: no   No complications documented.  Last Vitals:  Vitals:   01/08/21 0635 01/08/21 0835  BP: 123/85   Pulse: 68   Resp: 15   Temp: (!) 36.3 C 36.7 C  SpO2: 97%     Last Pain:  Vitals:   01/08/21 0835  TempSrc:   PainSc: 0-No pain                 Pervis Hocking

## 2021-01-08 NOTE — Interval H&P Note (Signed)
History and Physical Interval Note:  01/08/2021 7:37 AM  [name]  has presented today for surgery, with the diagnosis of menorrhagia.  The various methods of treatment have been discussed with the patient and family. After consideration of risks, benefits and other options for treatment, the patient has consented to  Procedure(s): [place] (N/A) as a surgical intervention.  The patient's history has been reviewed, patient examined, no change in status, stable for surgery.  I have reviewed the patient's chart and labs.  Questions were answered to the patient's satisfaction.     Michal Strzelecki Bovard-Stuckert

## 2021-01-08 NOTE — Brief Op Note (Signed)
01/08/2021  8:33 AM  PATIENT:  [name]  40 y.o. female  PRE-OPERATIVE DIAGNOSIS:  menorrhagia  POST-OPERATIVE DIAGNOSIS:  menorrhagia  PROCEDURE:  Procedure(s): DILATATION & CURETTAGE/HYSTEROSCOPY WITH NOVASURE ABLATION (N/A)  SURGEON:  Surgeon(s) and Role:    * Bovard-Stuckert, Jody, MD - Primary  ANESTHESIA:   local and general by LMA  EBL:  30 mL IVF and uop per anesthesia  BLOOD ADMINISTERED:none  DRAINS: none   LOCAL MEDICATIONS USED:  LIDOCAINE   SPECIMEN:  Source of Specimen:  endometrial currettings  DISPOSITION OF SPECIMEN:  PATHOLOGY  COUNTS:  YES  TOURNIQUET:  * No tourniquets in log *  DICTATION: .Other Dictation: Dictation Number [id number]  PLAN OF CARE: Discharge to home after PACU  PATIENT DISPOSITION:  PACU - hemodynamically stable.   Delay start of Pharmacological VTE agent (>24hrs) due to surgical blood loss or risk of bleeding: not applicable

## 2021-01-08 NOTE — Transfer of Care (Signed)
Immediate Anesthesia Transfer of Care Note  Patient: [name]  Procedure(s) Performed: Procedure(s) (LRB): DILATATION & CURETTAGE/HYSTEROSCOPY WITH NOVASURE ABLATION (N/A)  Patient Location: PACU  Anesthesia Type: General  Level of Consciousness: awake, oriented, sedated and patient cooperative  Airway & Oxygen Therapy: Patient Spontanous Breathing and Patient connected to face mask oxygen  Post-op Assessment: Report given to PACU RN and Post -op Vital signs reviewed and stable  Post vital signs: Reviewed and stable  Complications: No apparent anesthesia complications  Last Vitals:  Vitals Value Taken Time  BP 136/74 01/08/21 0834  Temp    Pulse 73 01/08/21 0836  Resp 16 01/08/21 0836  SpO2 100 % 01/08/21 0836  Vitals shown include unvalidated device data.  Last Pain:  Vitals:   01/08/21 0635  TempSrc: Oral  PainSc: 0-No pain      Patients Stated Pain Goal: 4 (47/20/72 1828)  Complications: No complications documented.

## 2021-01-09 ENCOUNTER — Encounter (HOSPITAL_BASED_OUTPATIENT_CLINIC_OR_DEPARTMENT_OTHER): Payer: Self-pay | Admitting: Obstetrics and Gynecology

## 2021-01-09 LAB — SURGICAL PATHOLOGY

## 2021-01-09 NOTE — Op Note (Signed)
NAME: [name], [facility] MEDICAL RECORD [id number] ACCOUNT [id number] DATE OF BIRTH:07/26/1981 FACILITY: [facility] LOCATION: [facility] PHYSICIAN:Jahad Old BOVARD-STUCKERT, MD  OPERATIVE REPORT  DATE OF PROCEDURE:  01/08/2021  PREOPERATIVE DIAGNOSIS:  Menorrhagia.  POSTOPERATIVE DIAGNOSES:  Menorrhagia, status post endometrial ablation.  PROCEDURE:  Hysteroscopy with D and C and NovaSure endometrial ablation.  SURGEON:  Janyth Contes, MD  ASSISTANT:  None.  ANESTHESIA:  Local and general by LMA.  ESTIMATED BLOOD LOSS:  30 mL.  IV FLUIDS AND URINE OUTPUT:  Per anesthesia.  COMPLICATIONS:  None.  PATHOLOGY:  Endometrial curettings.  DESCRIPTION OF PROCEDURE:  After informed consent was reviewed with the patient including risks, benefits and alternatives of the surgical procedure, she was transported to the operating room and placed on table in supine position.  General anesthesia  was induced and found to be adequate.  She was then placed in the [place] stirrups, prepped and draped in the normal sterile fashion.  Her bladder was sterilely drained.  Using an open-sided speculum, her cervix was visualized and using first a  disposable dilator and then two 21 [place], her cervix was dilated to accommodate a hysteroscopic camera.  Hysteroscopic survey revealed bilateral ostia and otherwise normal cavity.  A D and C was performed, the NovaSure ablation was then performed  following the standard techniques.  It was at 136 watts for 56 seconds.  Her _____ length was 9.  Her cervical length was 3.5 cm, giving her a cavity length of 5.5 cm and the device measured a width of 4.6.  The patient tolerated the procedure well.   Instruments were removed from her cervix.  The single-tooth tenaculum had been placed on the anterior lip caused minimal bleeding.  This was attempted to be sewn with silver nitrate unsuccessful, so a single suture of 3-0 Vicryl was placed.  Hemostasis  was assured.  The  instruments were removed from the vagina.  The patient was awakened in stable condition.  Sponge, lap and needle counts were correct x2.  HN/NUANCE  D:01/08/2021 T:01/08/2021 JOB:[id number]

## 2021-01-12 NOTE — Telephone Encounter (Signed)
Received fax confirmation 01-08-21.

## 2021-01-14 ENCOUNTER — Telehealth: Payer: Self-pay | Admitting: Family Medicine

## 2021-01-14 MED ORDER — AMPHETAMINE-DEXTROAMPHETAMINE 10 MG PO TABS: 10.0000 mg | ORAL_TABLET | Freq: Every day | ORAL | 0 refills | Status: DC

## 2021-01-14 NOTE — Telephone Encounter (Signed)
Pt called and is requesting a refill for her adderall please send to the  [facility] - [place], [place] - [address]   

## 2021-01-15 ENCOUNTER — Ambulatory Visit: Payer: 59

## 2021-01-30 ENCOUNTER — Telehealth: Payer: Self-pay | Admitting: Family Medicine

## 2021-01-30 ENCOUNTER — Other Ambulatory Visit: Payer: Self-pay | Admitting: Medical

## 2021-01-30 NOTE — Telephone Encounter (Signed)
Pt. Is requesting to have her vyvanse filled today instead of tomorrow per pharmacy not due to be filled until tomorrow 01/31/21.

## 2021-01-30 NOTE — Telephone Encounter (Signed)
Called pharmacy gave permission per your note to fill pts. Vyvanse today.

## 2021-01-30 NOTE — Telephone Encounter (Signed)
Pt called and states that [facility] filled Vyvanse on the 11th of the month. Refill is for the 12th. She requesting to be able to refill today the 11th. Please ok to [facility]. Pt can be reached at [phone]. Sending to [employer] as [employer] out.

## 2021-01-30 NOTE — Telephone Encounter (Signed)
I am not clear on what the request is.  See the phone message or MyChart message.  When I look at Dr. Lanice Shirts prescriptions her medication was refilled on February 9 and it looks like there are 3 scripts which would mean she would have refilled the second month's Rx on March 9th.   So either the pharmacy has 2 other prescription is on file for March 9 or April 9, or is she saying she does not have a refill at all?

## 2021-01-30 NOTE — Telephone Encounter (Signed)
Okay, please call and give the permission to her pharmacy to fill this today.  Based on our records her prescription should have dates of February 9, March 9, April 9 so I am certainly okay with her picking this up today.    I do not want to send a new prescription as that would complicate the documentation in the chart, and she is already showing that she has an active prescription on file

## 2021-02-09 NOTE — Progress Notes (Incomplete)
Patient Care Team: Denita Lung, MD as PCP - General (Family Medicine)  DIAGNOSIS: No diagnosis found.  CHIEF COMPLIANT: Follow-up of high risk for breast cancer   INTERVAL HISTORY: [name] is a 40 y.o. with above-mentioned history of high risk for breast cancer. Mammogram on 02/22/20 showed a possible left breast asymmetry. Diagnostic mammogram and [place] on 03/11/20 showed no evidence of malignancy and clustered cysts in the upper inner left breast. She presents to the clinic today for annual follow-up.   ALLERGIES:  has No Known Allergies.  MEDICATIONS:  Current Outpatient Medications  Medication Sig Dispense Refill  . amphetamine-dextroamphetamine (ADDERALL) 10 MG tablet Take 1 tablet (10 mg total) by mouth daily. 30 tablet 0  . aspirin EC 81 MG tablet Take 1 tablet (81 mg total) by mouth daily.    Marland Kitchen ibuprofen (ADVIL) 800 MG tablet Take 1 tablet (800 mg total) by mouth every 8 (eight) hours as needed for moderate pain. 45 tablet 1  . [START ON 03/03/2021] lisdexamfetamine (VYVANSE) 50 MG capsule Take 1 capsule (50 mg total) by mouth daily. 30 capsule 0  . lisdexamfetamine (VYVANSE) 50 MG capsule Take 1 capsule (50 mg total) by mouth daily. 30 capsule 0  . lisdexamfetamine (VYVANSE) 50 MG capsule Take 1 capsule (50 mg total) by mouth daily. 30 capsule 0  . losartan-hydrochlorothiazide (HYZAAR) 50-12.5 MG tablet Take 1 tablet by mouth daily. 90 tablet 3  . oxyCODONE (ROXICODONE) 5 MG immediate release tablet Take 1 tablet (5 mg total) by mouth every 6 (six) hours as needed for severe pain. 10 tablet 0  . sertraline (ZOLOFT) 50 MG tablet Take 1 tablet (50 mg total) by mouth daily. (Patient taking differently: Take 50 mg by mouth at bedtime.) 90 tablet 3   No current facility-administered medications for this visit.    PHYSICAL EXAMINATION: ECOG PERFORMANCE STATUS: {CHL ONC ECOG PS:[phone]}  There were no vitals filed for this visit. There were no vitals filed for this  visit.  BREAST:*** No palpable masses or nodules in either right or left breasts. No palpable axillary supraclavicular or infraclavicular adenopathy no breast tenderness or nipple discharge. (exam performed in the presence of a chaperone)  LABORATORY DATA:  I have reviewed the data as listed CMP Latest Ref Rng & Units 01/08/2021 08/11/2020 01/08/2019  Glucose 70 - 99 mg/dL 97 96 102(H)  BUN 6 - 20 mg/dL 14 14 7   Creatinine 0.44 - 1.00 mg/dL 0.50 0.69 0.80  Sodium 135 - 145 mmol/L 140 137 140  Potassium 3.5 - 5.1 mmol/L 3.7 4.4 3.7  Chloride 98 - 111 mmol/L 108 103 104  CO2 20 - 29 mmol/L - 23 28  Calcium 8.7 - 10.2 mg/dL - 9.8 8.9  Total Protein 6.0 - 8.5 g/dL - 7.2 7.0  Total Bilirubin 0.0 - 1.2 mg/dL - <0.2 0.6  Alkaline Phos 44 - 121 IU/L - 99 73  AST 0 - 40 IU/L - 19 20  ALT 0 - 32 IU/L - 16 23    Lab Results  Component Value Date   WBC 6.8 01/08/2021   HGB 13.3 01/08/2021   HCT 39.0 01/08/2021   MCV 88.7 01/08/2021   PLT 270 01/08/2021   NEUTROABS 5.1 08/11/2020    ASSESSMENT & PLAN:  No problem-specific Assessment & Plan notes found for this encounter.    No orders of the defined types were placed in this encounter.  The patient has a good understanding of the overall plan. she agrees  with it. she will call with any problems that may develop before the next visit here.  Total time spent: *** mins including face to face time and time spent for planning, charting and coordination of care  Rulon Eisenmenger, MD, MPH 02/09/2021  I, Molly Dorshimer, am acting as scribe for Dr. Nicholas Lose.  {insert scribe attestation}

## 2021-02-10 ENCOUNTER — Inpatient Hospital Stay: Payer: 59 | Attending: Hematology and Oncology | Admitting: Hematology and Oncology

## 2021-02-10 NOTE — Assessment & Plan Note (Deleted)
[name] estimates her lifetime risk of developing breast cancer to be approximately 24.1%. [name] model estimates her 5-year risk for breast cancer to be approximately 0.7% Mother had breast cancer and also had genetic testing through [facility]: Negative for mutations Maternal aunt and maternal grandmother also had breast cancers. [name] did not require genetic testing based on her mother's test results.  Breast cancer surveillance: 1.  Breast exam: 02/10/2021: Benign 2. mammogram 03/11/2020 with ultrasound: No evidence of malignancy in the left breast.  Benign clustered cysts UIQ 3.  Breast MRI had been ordered but has not been performed.  Return to clinic in 1 year for follow-up.

## 2021-02-16 ENCOUNTER — Telehealth: Payer: Self-pay | Admitting: Family Medicine

## 2021-02-16 ENCOUNTER — Ambulatory Visit (INDEPENDENT_AMBULATORY_CARE_PROVIDER_SITE_OTHER): Payer: 59 | Admitting: Family Medicine

## 2021-02-16 ENCOUNTER — Encounter: Payer: Self-pay | Admitting: Family Medicine

## 2021-02-16 ENCOUNTER — Other Ambulatory Visit: Payer: Self-pay

## 2021-02-16 VITALS — BP 120/84 | HR 98 | Temp 98.3°F | Wt 153.8 lb

## 2021-02-16 DIAGNOSIS — M79672 Pain in left foot: Secondary | ICD-10-CM

## 2021-02-16 NOTE — Telephone Encounter (Signed)
Pt needs refill on Adderall sent to [facility]

## 2021-02-16 NOTE — Progress Notes (Signed)
   Subjective:    Patient ID: [name], female    DOB: 1981-02-21, 40 y.o.   MRN: [id number]  HPI She states that she injured her left fifth toe at least a month ago and a lateral extension move causing some bleeding in that area.  She is now having pain in the midportion of the fifth metatarsal.  Review of Systems     Objective:   Physical Exam Left foot exam shows no obvious deformity.  Slight tenderness over the midportion of the fifth metatarsal.  Tuning fork test did cause some discomfort over that area.       Assessment & Plan:  Left foot pain I explained that at this late date even if it was a fracture, the therapy would be the same.  Recommend hard soled shoes.  If she continues have difficulty, she will call and I will refer to orthopedics.

## 2021-02-17 MED ORDER — AMPHETAMINE-DEXTROAMPHETAMINE 10 MG PO TABS: 10.0000 mg | ORAL_TABLET | Freq: Every day | ORAL | 0 refills | Status: DC

## 2021-02-17 NOTE — Telephone Encounter (Signed)
Pt. Called back stating that she needs her adderall refilled wanted to make sure you got the message from yesterday. Pt. Last apt was 02/16/21.

## 2021-04-06 ENCOUNTER — Telehealth: Payer: Self-pay | Admitting: Family Medicine

## 2021-04-06 MED ORDER — LISDEXAMFETAMINE DIMESYLATE 50 MG PO CAPS: 50.0000 mg | ORAL_CAPSULE | Freq: Every day | ORAL | 0 refills | Status: DC

## 2021-04-06 NOTE — Telephone Encounter (Signed)
Pt called and is requesting a refill for her vyvanse please send to the [facility], [place]

## 2021-04-15 ENCOUNTER — Telehealth: Payer: Self-pay

## 2021-04-15 NOTE — Telephone Encounter (Signed)
Pt. Called stating she needs a refill on her Adderall 10 mg to [facility] pt. Last apt was 02/16/21.

## 2021-04-15 NOTE — Telephone Encounter (Signed)
See message about refill

## 2021-04-16 MED ORDER — AMPHETAMINE-DEXTROAMPHETAMINE 10 MG PO TABS: 10.0000 mg | ORAL_TABLET | Freq: Every day | ORAL | 0 refills | Status: DC

## 2021-05-04 ENCOUNTER — Telehealth: Payer: Self-pay

## 2021-05-05 ENCOUNTER — Telehealth: Payer: Self-pay

## 2021-05-05 NOTE — Telephone Encounter (Signed)
Dr. Redmond School handled, called pharmacy & taken care of

## 2021-05-05 NOTE — Telephone Encounter (Signed)
Pt called and states she is out of her Vyvanse, said was filled on 16th because it was the weekend but usually gets on 13th and is out and would like refill. Only reason she knew she was out early is because May had 31 days in it.  Would like to see if she can get it filled today because she is out

## 2021-05-14 ENCOUNTER — Telehealth: Payer: Self-pay

## 2021-05-14 NOTE — Telephone Encounter (Signed)
Pt. Called stating she needs a refill on her Adderall pt/ last apt was 02/16/21. Please send to [facility].

## 2021-05-15 MED ORDER — AMPHETAMINE-DEXTROAMPHETAMINE 10 MG PO TABS: 10.0000 mg | ORAL_TABLET | Freq: Every day | ORAL | 0 refills | Status: DC

## 2021-05-26 DIAGNOSIS — R309 Painful micturition, unspecified: Secondary | ICD-10-CM | POA: Diagnosis not present

## 2021-06-01 ENCOUNTER — Telehealth: Payer: Self-pay | Admitting: Family Medicine

## 2021-06-01 ENCOUNTER — Other Ambulatory Visit: Payer: Self-pay | Admitting: Family Medicine

## 2021-06-01 ENCOUNTER — Other Ambulatory Visit: Payer: Self-pay | Admitting: Medical

## 2021-06-01 MED ORDER — LISDEXAMFETAMINE DIMESYLATE 50 MG PO CAPS: 50.0000 mg | ORAL_CAPSULE | Freq: Every day | ORAL | 0 refills | Status: DC

## 2021-06-01 MED ORDER — AMPHETAMINE-DEXTROAMPHETAMINE 10 MG PO TABS: 10.0000 mg | ORAL_TABLET | Freq: Every day | ORAL | 0 refills | Status: DC

## 2021-06-01 NOTE — Telephone Encounter (Signed)
Pt called and is requesting that her vyvanse be filled on the 12th instead of the 16th, states it was filled early last month, and she will be out,   Informed pt that dr Redmond School is out of the office

## 2021-06-03 ENCOUNTER — Telehealth: Payer: Self-pay | Admitting: Internal Medicine

## 2021-06-03 NOTE — Telephone Encounter (Signed)
Sent P.A through cover my meds. Waiting on reply

## 2021-06-03 NOTE — Telephone Encounter (Signed)
Approved though 06/03/2022. Left message for pt to pick up at [facility]

## 2021-06-29 ENCOUNTER — Other Ambulatory Visit: Payer: Self-pay | Admitting: Hematology and Oncology

## 2021-06-29 ENCOUNTER — Telehealth: Payer: Self-pay

## 2021-06-29 DIAGNOSIS — Z1231 Encounter for screening mammogram for malignant neoplasm of breast: Secondary | ICD-10-CM

## 2021-06-29 NOTE — Telephone Encounter (Signed)
Pt states she feels like she needs an increase in her Vyvanse and would like to go up to '60mg'$ ,  states it is working but not as well as it used to and would like to increase and uses [facility]

## 2021-06-29 NOTE — Telephone Encounter (Signed)
Left message for pt to schedule virtual appt

## 2021-06-30 ENCOUNTER — Encounter: Payer: Self-pay | Admitting: Family Medicine

## 2021-06-30 ENCOUNTER — Telehealth (INDEPENDENT_AMBULATORY_CARE_PROVIDER_SITE_OTHER): Payer: 59 | Admitting: Family Medicine

## 2021-06-30 ENCOUNTER — Other Ambulatory Visit: Payer: Self-pay

## 2021-06-30 VITALS — BP 138/78 | Wt 153.0 lb

## 2021-06-30 DIAGNOSIS — F902 Attention-deficit hyperactivity disorder, combined type: Secondary | ICD-10-CM

## 2021-06-30 MED ORDER — LISDEXAMFETAMINE DIMESYLATE 50 MG PO CAPS: 50.0000 mg | ORAL_CAPSULE | Freq: Every day | ORAL | 0 refills | Status: DC

## 2021-06-30 NOTE — Progress Notes (Signed)
   Subjective:    Patient ID: [name], female    DOB: 01-Nov-1981, 40 y.o.   MRN: [id number]  HPI Documentation for virtual audio and video telecommunications through [place] encounter: The patient was located at home. 2 patient identifiers used.  The provider was located in the office. The patient did consent to this visit and is aware of possible charges through their insurance for this visit. The other persons participating in this telemedicine service were none. Time spent on call was 10 minutes and in review of previous records >20 minutes total for counseling and coordination of care. This virtual service is not related to other E/M service within previous 7 days.  She has been stable on her Vyvanse and supplemental Adderall dosing for quite some time but in the last month or so she feels that it is not being as effective.  She usually takes the Vyvanse in the morning with food and with her other medications and has not noted this before the last month or so.  Nothing else in her life has changed to cause any of this.  The Vyvanse usually lasts 6 to 7 hours.  She uses the Adderall after that to help stay focused for when the kids come home from school.  Review of Systems     Objective:   Physical Exam Alert and in no distress otherwise not examined       Assessment & Plan:  Attention deficit hyperactivity disorder (ADHD), combined type - Plan: lisdexamfetamine (VYVANSE) 50 MG capsule After discussion with her, she is to take the Vyvanse on an empty stomach and not mix it with either food or other pills and do this for the next couple weeks to see if it makes any difference.  If she still has focus issues then I will increase her to the next higher dose and see how she tolerates that.  She was comfortable with that.

## 2021-07-02 NOTE — Telephone Encounter (Signed)
Appt made

## 2021-07-09 ENCOUNTER — Telehealth: Payer: Self-pay

## 2021-07-09 NOTE — Telephone Encounter (Signed)
Pt needs refills Adderall to [facility]

## 2021-07-10 ENCOUNTER — Other Ambulatory Visit: Payer: Self-pay | Admitting: Medical

## 2021-07-10 MED ORDER — AMPHETAMINE-DEXTROAMPHETAMINE 10 MG PO TABS: 10.0000 mg | ORAL_TABLET | Freq: Every day | ORAL | 0 refills | Status: DC

## 2021-07-10 NOTE — Telephone Encounter (Signed)
Patient takes both see 06/30/21 visit,  her Vyvanse was increased and pt takes Adderall in afternoon for supplemental dosing

## 2021-07-28 ENCOUNTER — Telehealth: Payer: Self-pay | Admitting: Family Medicine

## 2021-07-28 DIAGNOSIS — F902 Attention-deficit hyperactivity disorder, combined type: Secondary | ICD-10-CM

## 2021-07-28 MED ORDER — LISDEXAMFETAMINE DIMESYLATE 60 MG PO CAPS: 60.0000 mg | ORAL_CAPSULE | ORAL | 0 refills | Status: DC

## 2021-07-28 NOTE — Telephone Encounter (Signed)
Pt called and states that last appt it was discussed increasing her Vyvanse to '60mg'$ . It was not done at appt but she thinks it needs to be increased. She states that with school starting and everything what she is on is not enough. Please advise pt at [phone].  Pt uses [facility].

## 2021-07-29 ENCOUNTER — Telehealth: Payer: Self-pay | Admitting: Family Medicine

## 2021-07-29 NOTE — Telephone Encounter (Signed)
Left message for pt that per [facility] higher dose was sent in yesterday

## 2021-07-29 NOTE — Telephone Encounter (Signed)
Pt states that she just feels like the 50 mg "Is not doing it", she states she also tried the recommendations you gave at her last virtual visit and still feels  it is not quite doing it

## 2021-07-30 NOTE — Telephone Encounter (Signed)
Sent my chart message to find out how she is doing. [place]

## 2021-07-31 NOTE — Telephone Encounter (Signed)
Called and LVM for her to call back and advise how the increase of med is working. [place]

## 2021-08-03 ENCOUNTER — Telehealth: Payer: Self-pay | Admitting: Family Medicine

## 2021-08-03 MED ORDER — LISDEXAMFETAMINE DIMESYLATE 50 MG PO CAPS: 50.0000 mg | ORAL_CAPSULE | Freq: Every day | ORAL | 0 refills | Status: DC

## 2021-08-03 NOTE — Telephone Encounter (Signed)
Pt called and stated, " as much as it pains her to say it, you were right" 60 mg is too much and she needs to go back to the 50 mg. She does need rx for 50 mg

## 2021-08-07 ENCOUNTER — Telehealth: Payer: Self-pay

## 2021-08-07 MED ORDER — AMPHETAMINE-DEXTROAMPHETAMINE 10 MG PO TABS: 10.0000 mg | ORAL_TABLET | Freq: Every day | ORAL | 0 refills | Status: DC

## 2021-08-07 NOTE — Telephone Encounter (Signed)
Pt. Called stating that she also needs a refill on her Adderall last apt was 06/30/21.

## 2021-08-19 ENCOUNTER — Ambulatory Visit
Admission: RE | Admit: 2021-08-19 | Discharge: 2021-08-19 | Disposition: A | Discharge status: Home / Self Care | Payer: Medicaid Other | Source: Ambulatory Visit | Attending: Hematology and Oncology | Admitting: Hematology and Oncology

## 2021-08-19 ENCOUNTER — Other Ambulatory Visit: Payer: Self-pay

## 2021-08-19 DIAGNOSIS — Z1231 Encounter for screening mammogram for malignant neoplasm of breast: Secondary | ICD-10-CM

## 2021-08-27 ENCOUNTER — Other Ambulatory Visit: Payer: Self-pay | Admitting: Family Medicine

## 2021-08-27 DIAGNOSIS — I10 Essential (primary) hypertension: Secondary | ICD-10-CM

## 2021-08-31 ENCOUNTER — Telehealth: Payer: Self-pay | Admitting: Family Medicine

## 2021-08-31 MED ORDER — LISDEXAMFETAMINE DIMESYLATE 50 MG PO CAPS: 50.0000 mg | ORAL_CAPSULE | Freq: Every day | ORAL | 0 refills | Status: DC

## 2021-08-31 NOTE — Telephone Encounter (Signed)
Pt is requesting a refill on Vyvanse 50mg  sent to [facility]

## 2021-09-03 ENCOUNTER — Other Ambulatory Visit: Payer: Self-pay | Admitting: Family Medicine

## 2021-09-03 DIAGNOSIS — F410 Panic disorder [episodic paroxysmal anxiety] without agoraphobia: Secondary | ICD-10-CM

## 2021-09-03 NOTE — Telephone Encounter (Signed)
[facility] is requesting to fill pt zoloft. Please advise [facility]

## 2021-09-18 ENCOUNTER — Telehealth: Payer: Self-pay | Admitting: Family Medicine

## 2021-09-18 MED ORDER — AMPHETAMINE-DEXTROAMPHETAMINE 10 MG PO TABS: 10.0000 mg | ORAL_TABLET | Freq: Every day | ORAL | 0 refills | Status: DC

## 2021-09-18 NOTE — Telephone Encounter (Signed)
Pt is requesting a refill on Adderrall 10mg  sent to [facility]

## 2021-09-25 ENCOUNTER — Other Ambulatory Visit: Payer: Self-pay | Admitting: Medical

## 2021-09-25 ENCOUNTER — Telehealth: Payer: Self-pay | Admitting: Family Medicine

## 2021-09-25 NOTE — Telephone Encounter (Signed)
Pt is requesting refill on Vyvanse sent to [facility]

## 2021-10-19 ENCOUNTER — Telehealth: Payer: Self-pay

## 2021-10-19 MED ORDER — AMPHETAMINE-DEXTROAMPHETAMINE 10 MG PO TABS: 10.0000 mg | ORAL_TABLET | Freq: Every day | ORAL | 0 refills | Status: DC

## 2021-10-19 NOTE — Telephone Encounter (Signed)
PT. Called stating she needs a refill on her Adderall 10 mg to [place] last apt was 06/30/21.

## 2021-10-28 ENCOUNTER — Other Ambulatory Visit: Payer: Self-pay | Admitting: Family Medicine

## 2021-10-28 DIAGNOSIS — I10 Essential (primary) hypertension: Secondary | ICD-10-CM

## 2021-11-03 ENCOUNTER — Ambulatory Visit: Payer: Medicaid Other | Admitting: Family Medicine

## 2021-11-03 ENCOUNTER — Encounter: Payer: Self-pay | Admitting: Family Medicine

## 2021-11-03 ENCOUNTER — Other Ambulatory Visit: Payer: Self-pay

## 2021-11-03 VITALS — BP 120/82 | HR 75 | Temp 97.0°F | Wt 146.8 lb

## 2021-11-03 DIAGNOSIS — F410 Panic disorder [episodic paroxysmal anxiety] without agoraphobia: Secondary | ICD-10-CM | POA: Diagnosis not present

## 2021-11-03 DIAGNOSIS — F902 Attention-deficit hyperactivity disorder, combined type: Secondary | ICD-10-CM

## 2021-11-03 DIAGNOSIS — I10 Essential (primary) hypertension: Secondary | ICD-10-CM | POA: Diagnosis not present

## 2021-11-03 DIAGNOSIS — Z9189 Other specified personal risk factors, not elsewhere classified: Secondary | ICD-10-CM | POA: Diagnosis not present

## 2021-11-03 MED ORDER — AMPHETAMINE-DEXTROAMPHETAMINE 10 MG PO TABS: 10.0000 mg | ORAL_TABLET | Freq: Every day | ORAL | 0 refills | Status: DC

## 2021-11-03 MED ORDER — LISDEXAMFETAMINE DIMESYLATE 50 MG PO CAPS: 50.0000 mg | ORAL_CAPSULE | Freq: Every day | ORAL | 0 refills | Status: DC

## 2021-11-03 MED ORDER — LOSARTAN POTASSIUM-HCTZ 50-12.5 MG PO TABS: 1.0000 | ORAL_TABLET | Freq: Every day | ORAL | 3 refills | Status: DC

## 2021-11-03 NOTE — Progress Notes (Signed)
° °  Subjective:    Patient ID: [name], female    DOB: 1981/04/12, 40 y.o.   MRN: [id number]  HPI She is here for an interval evaluation.  She does have a long history of ADHD and is quite stable on Vyvanse that lasts roughly 8 hours.  She uses a second dosing to help after school and to to get through the early evening.  She has had no difficulty with this causing sleep disturbance.  She has decreased her Zoloft to 50 mg saying the panic/anxiety symptoms have greatly diminished.  She does have a history of high risk for breast cancer.  She has been seen by Dr. Lindi Adie concerning this.  She recognizes the need for follow-up on this and possibly get an MRI.  Otherwise she is done quite well and having no neurologic.  She continues on her blood pressure medication.   Review of Systems     Objective:   Physical Exam Alert and in no distress otherwise not examined       Assessment & Plan:  Attention deficit hyperactivity disorder (ADHD), combined type  Essential hypertension - Plan: losartan-hydrochlorothiazide (HYZAAR) 50-12.5 MG tablet  Panic disorder  At high risk for breast cancer Prescriptions were written for her medication.  I will have them all be renewed on the same date to help clean up the record.  She is using them appropriately. Continue on her blood pressure medication.   We will keep her on the Zoloft and consider stopping this sometime next spring.  She is willing to do this.  She is also to set up for complete exam. She will discuss getting the breast MRI which was apparently scheduled in the past but not done with Dr. Lindi Adie

## 2021-11-27 DIAGNOSIS — Z6825 Body mass index (BMI) 25.0-25.9, adult: Secondary | ICD-10-CM | POA: Diagnosis not present

## 2021-11-27 DIAGNOSIS — Z01419 Encounter for gynecological examination (general) (routine) without abnormal findings: Secondary | ICD-10-CM | POA: Diagnosis not present

## 2021-11-27 DIAGNOSIS — Z803 Family history of malignant neoplasm of breast: Secondary | ICD-10-CM | POA: Diagnosis not present

## 2022-01-01 ENCOUNTER — Telehealth: Payer: Self-pay | Admitting: Family Medicine

## 2022-01-01 MED ORDER — LISDEXAMFETAMINE DIMESYLATE 50 MG PO CAPS: 50.0000 mg | ORAL_CAPSULE | Freq: Every day | ORAL | 0 refills | Status: DC

## 2022-01-01 MED ORDER — AMPHETAMINE-DEXTROAMPHETAMINE 10 MG PO TABS: 10.0000 mg | ORAL_TABLET | Freq: Every day | ORAL | 0 refills | Status: DC

## 2022-01-01 NOTE — Telephone Encounter (Signed)
Pt called re her Adderall and Vyvanse rx's   She wants to see if she can get the refill date moved up to today. They are set to refill on Sunday but her pharmacy is closed on Sundays And they close early Saturday and she will be out of town at a tournament for her daughter   Please advise patient

## 2022-01-04 ENCOUNTER — Other Ambulatory Visit: Payer: Self-pay

## 2022-01-04 ENCOUNTER — Telehealth (INDEPENDENT_AMBULATORY_CARE_PROVIDER_SITE_OTHER): Payer: Medicaid Other | Admitting: Family Medicine

## 2022-01-04 DIAGNOSIS — J0100 Acute maxillary sinusitis, unspecified: Secondary | ICD-10-CM

## 2022-01-04 NOTE — Progress Notes (Signed)
° °  Subjective:    Patient ID: [name], female    DOB: 04-07-1981, 41 y.o.   MRN: [id number]  HPI Documentation for virtual audio and video telecommunications through [place] encounter: The patient was located at home. 2 patient identifiers used.  The provider was located in the office. The patient did consent to this visit and is aware of possible charges through their insurance for this visit. The other persons participating in this telemedicine service were none. Time spent on call was 5 minutes and in review of previous records >20 minutes total for counseling and coordination of care. This virtual service is not related to other E/M service within previous 7 days.  She states that last Tuesday she developed nasal congestion, rhinorrhea, maxillary sinus pressure with slight sore throat.  She then developed some malaise and 3 days later is noted some purulent nasal discharge.  Today she does state that she feels a little bit better. No fever chills, cough, earache Review of Systems     Objective:   Physical Exam In no distress with a normal voice pattern.  Alert and      Assessment & Plan:  Acute maxillary sinusitis, recurrence not specified I explained that at this point since she is getting better it indicates probably a viral type of infection.  Recommend she continue with conservative care and as long she keeps improving, no further intervention needed.  She will call later in the week if there is any questions about how well she is doing.

## 2022-03-08 ENCOUNTER — Other Ambulatory Visit: Payer: Self-pay | Admitting: Family Medicine

## 2022-03-08 DIAGNOSIS — F410 Panic disorder [episodic paroxysmal anxiety] without agoraphobia: Secondary | ICD-10-CM

## 2022-03-08 NOTE — Telephone Encounter (Signed)
Pt is requesting a refill on Sertraline sent to [facility]. Pt has CPE on 03/30/22 ?

## 2022-03-26 ENCOUNTER — Telehealth: Payer: Self-pay

## 2022-03-26 NOTE — Telephone Encounter (Signed)
Pt. Called stating she wanted to know if you could increase her Adderall that she takes in the afternoon. She currently takes 10 mg.  ?

## 2022-03-29 ENCOUNTER — Telehealth: Payer: Medicaid Other | Admitting: Family Medicine

## 2022-03-29 ENCOUNTER — Encounter: Payer: Self-pay | Admitting: Family Medicine

## 2022-03-29 ENCOUNTER — Telehealth: Payer: Self-pay | Admitting: Family Medicine

## 2022-03-29 VITALS — BP 138/80 | Wt 146.0 lb

## 2022-03-29 DIAGNOSIS — F902 Attention-deficit hyperactivity disorder, combined type: Secondary | ICD-10-CM

## 2022-03-29 MED ORDER — AMPHETAMINE-DEXTROAMPHETAMINE 12.5 MG PO TABS: 12.5000 mg | ORAL_TABLET | Freq: Every day | ORAL | 0 refills | Status: DC

## 2022-03-29 NOTE — Telephone Encounter (Signed)
Pt called and states that her adderall 12.5 was not at the pharmacy it was not electronily sent in  ?Pt uses [place], [place] ?

## 2022-03-29 NOTE — Progress Notes (Signed)
? ?  Subjective:  ? ? Patient ID: [name], female    DOB: 1981-02-19, 41 y.o.   MRN: [id number] ? ?HPI ?Documentation for virtual audio and video telecommunications through [place] encounter: ?The patient was located at home. 2 patient identifiers used.  ?The provider was located in the office. ?The patient did consent to this visit and is aware of possible charges through their insurance for this visit. ?The other persons participating in this telemedicine service were none. ?Time spent on call was 5 minutes and in review of previous records >20 ? minutes total for counseling and coordination of care. ?This virtual service is not related to other E/M service within previous 7 days.  ?She would like to work on getting better coverage later on in the day.  Presently she is on Vyvanse 50 and she takes it at 7 in the morning.  She takes the Adderall 10 mg at 2 in the afternoon.  She did at one time try to take a 60 mg Vyvanse but it really did not work well with her.  She now states that the 10 mg is not working the way she would like and that she feels sluggish, distracted cannot finish tasks.  She says her sleep is okay at that dosing. ? ?Review of Systems ? ?   ?Objective:  ? Physical Exam ?Alert and in no distress otherwise not examined ? ? ? ?   ?Assessment & Plan:  ?Attention deficit hyperactivity disorder (ADHD), combined type ?I will go up to 12.5 to see if that will help with better control of her ADD symptoms.  Want to go up slowly to hopefully not interfere with her sleep patterns which right now seem to be pretty good.  She will call me in 3 weeks to let me know how she is doing. ? ?

## 2022-03-30 ENCOUNTER — Encounter: Payer: Medicaid Other | Admitting: Family Medicine

## 2022-03-31 ENCOUNTER — Telehealth: Payer: Self-pay

## 2022-03-31 MED ORDER — LISDEXAMFETAMINE DIMESYLATE 50 MG PO CAPS: 50.0000 mg | ORAL_CAPSULE | Freq: Every day | ORAL | 0 refills | Status: DC

## 2022-03-31 NOTE — Telephone Encounter (Signed)
Pt. Called wanting a refill on her Vyvanse 50 mg called in today to [facility]. Pt. Last apt was 03/29/22 next apt is 04/29/22. ?

## 2022-04-21 ENCOUNTER — Telehealth: Payer: Self-pay

## 2022-04-21 MED ORDER — AMPHETAMINE-DEXTROAMPHETAMINE 15 MG PO TABS: 15.0000 mg | ORAL_TABLET | Freq: Every day | ORAL | 0 refills | Status: DC

## 2022-04-21 NOTE — Telephone Encounter (Signed)
Pt called and states the 12.5 Adderall has helped, no issues with sleep, no issues at all but would like to go up to '15mg'$ , please send to [facility]

## 2022-04-28 ENCOUNTER — Telehealth: Payer: Self-pay | Admitting: Family Medicine

## 2022-04-28 NOTE — Telephone Encounter (Signed)
[name] is asking if you can refill her Vivanse before 05/01/22 because she will be out of town to go to her sons graduation.

## 2022-04-29 ENCOUNTER — Encounter: Payer: Medicaid Other | Admitting: Family Medicine

## 2022-04-29 NOTE — Telephone Encounter (Signed)
[facility] was called to advised of early pick up. [place]

## 2022-05-04 ENCOUNTER — Ambulatory Visit: Payer: Medicaid Other | Admitting: Family Medicine

## 2022-05-04 ENCOUNTER — Encounter: Payer: Self-pay | Admitting: Family Medicine

## 2022-05-04 VITALS — BP 124/82 | HR 76 | Temp 97.5°F | Ht 63.0 in | Wt 144.8 lb

## 2022-05-04 DIAGNOSIS — Z8673 Personal history of transient ischemic attack (TIA), and cerebral infarction without residual deficits: Secondary | ICD-10-CM

## 2022-05-04 DIAGNOSIS — F410 Panic disorder [episodic paroxysmal anxiety] without agoraphobia: Secondary | ICD-10-CM

## 2022-05-04 DIAGNOSIS — Z0000 Encounter for general adult medical examination without abnormal findings: Secondary | ICD-10-CM | POA: Diagnosis not present

## 2022-05-04 DIAGNOSIS — F902 Attention-deficit hyperactivity disorder, combined type: Secondary | ICD-10-CM

## 2022-05-04 DIAGNOSIS — I10 Essential (primary) hypertension: Secondary | ICD-10-CM

## 2022-05-04 DIAGNOSIS — Z803 Family history of malignant neoplasm of breast: Secondary | ICD-10-CM | POA: Diagnosis not present

## 2022-05-04 DIAGNOSIS — E559 Vitamin D deficiency, unspecified: Secondary | ICD-10-CM

## 2022-05-04 DIAGNOSIS — F341 Dysthymic disorder: Secondary | ICD-10-CM

## 2022-05-04 LAB — COMPREHENSIVE METABOLIC PANEL
ALT: 14 IU/L (ref 0–32)
AST: 18 IU/L (ref 0–40)
Albumin/Globulin Ratio: 2.1 (ref 1.2–2.2)
Albumin: 4.8 g/dL (ref 3.8–4.8)
Alkaline Phosphatase: 72 IU/L (ref 44–121)
BUN/Creatinine Ratio: 16 (ref 9–23)
BUN: 11 mg/dL (ref 6–20)
Bilirubin Total: 0.4 mg/dL (ref 0.0–1.2)
CO2: 24 mmol/L (ref 20–29)
Calcium: 9.6 mg/dL (ref 8.7–10.2)
Chloride: 101 mmol/L (ref 96–106)
Creatinine, Ser: 0.67 mg/dL (ref 0.57–1.00)
Globulin, Total: 2.3 g/dL (ref 1.5–4.5)
Glucose: 65 mg/dL — ABNORMAL LOW (ref 70–99)
Potassium: 4.1 mmol/L (ref 3.5–5.2)
Sodium: 140 mmol/L (ref 134–144)
Total Protein: 7.1 g/dL (ref 6.0–8.5)
eGFR: 115 mL/min/{1.73_m2} (ref >59)

## 2022-05-04 LAB — CBC WITH DIFFERENTIAL/PLATELET
Basophils Absolute: 0 10*3/uL (ref 0.0–0.2)
Basos: 1 %
EOS (ABSOLUTE): 0.1 10*3/uL (ref 0.0–0.4)
Eos: 2 %
Hematocrit: 42.3 % (ref 34.0–46.6)
Hemoglobin: 14.2 g/dL (ref 11.1–15.9)
Immature Grans (Abs): 0 10*3/uL (ref 0.0–0.1)
Immature Granulocytes: 0 %
Lymphocytes Absolute: 2.4 10*3/uL (ref 0.7–3.1)
Lymphs: 39 %
MCH: 29.6 pg (ref 26.6–33.0)
MCHC: 33.6 g/dL (ref 31.5–35.7)
MCV: 88 fL (ref 79–97)
Monocytes Absolute: 0.5 10*3/uL (ref 0.1–0.9)
Monocytes: 8 %
Neutrophils Absolute: 3.2 10*3/uL (ref 1.4–7.0)
Neutrophils: 50 %
Platelets: 294 10*3/uL (ref 150–450)
RBC: 4.8 x10E6/uL (ref 3.77–5.28)
RDW: 13.1 % (ref 11.7–15.4)
WBC: 6.2 10*3/uL (ref 3.4–10.8)

## 2022-05-04 LAB — LIPID PANEL
Chol/HDL Ratio: 2.9 ratio (ref 0.0–4.4)
Cholesterol, Total: 162 mg/dL (ref 100–199)
HDL: 55 mg/dL (ref >39)
LDL Chol Calc (NIH): 88 mg/dL (ref 0–99)
Triglycerides: 103 mg/dL (ref 0–149)
VLDL Cholesterol Cal: 19 mg/dL (ref 5–40)

## 2022-05-04 MED ORDER — LOSARTAN POTASSIUM-HCTZ 50-12.5 MG PO TABS: 1.0000 | ORAL_TABLET | Freq: Every day | ORAL | 3 refills | Status: DC

## 2022-05-04 MED ORDER — SERTRALINE HCL 50 MG PO TABS: 50.0000 mg | ORAL_TABLET | Freq: Every day | ORAL | 3 refills | Status: DC

## 2022-05-04 NOTE — Progress Notes (Signed)
   Subjective:    Patient ID: [name], female    DOB: 02-24-1981, 41 y.o.   MRN: [id number]  HPI She is here for complete examination.  She does have a history of CVA and still does have some residual dizziness if she moves too quickly otherwise she seems to be doing well with that.  She has done well on the Vyvanse which last 6 to 8 hours.  She also uses Adderall later in the day to help stay focused for when the children come home.  She has children from age 9-18.  She continues to do quite nicely on Zoloft and has no plans to stop it.  She is on losartan/HCTZ for her blood pressure.  She has had a mammogram and plans to continue to follow-up on that as her mother did have breast cancer.  She has a history of vitamin D deficiency.  Her marriage is going well.  Family and social history as well as health maintenance and immunizations was reviewed.   Review of Systems  All other systems reviewed and are negative.      Objective:   Physical Exam Alert and in no distress. Tympanic membranes and canals are normal. Pharyngeal area is normal. Neck is supple without adenopathy or thyromegaly. Cardiac exam shows a regular sinus rhythm without murmurs or gallops. Lungs are clear to auscultation. Abdominal exam shows no masses or tenderness with normal bowel sounds       Assessment & Plan:  Routine general medical examination at a health care facility - Plan: CBC with Differential/Platelet, Comprehensive metabolic panel, Lipid panel  History of CVA (cerebrovascular accident)  Panic disorder - Plan: sertraline (ZOLOFT) 50 MG tablet  Dysthymia  Attention deficit hyperactivity disorder (ADHD), combined type  Vitamin D deficiency  Family history of breast cancer  Essential hypertension - Plan: losartan-hydrochlorothiazide (HYZAAR) 50-12.5 MG tablet She seems be doing quite nicely on her present regimen and I will keep her on it.  She is very comfortable with this.

## 2022-05-19 ENCOUNTER — Telehealth: Payer: Self-pay | Admitting: Family Medicine

## 2022-05-19 MED ORDER — AMPHETAMINE-DEXTROAMPHETAMINE 15 MG PO TABS: 15.0000 mg | ORAL_TABLET | Freq: Every day | ORAL | 0 refills | Status: DC

## 2022-05-19 MED ORDER — LISDEXAMFETAMINE DIMESYLATE 50 MG PO CAPS: 50.0000 mg | ORAL_CAPSULE | Freq: Every day | ORAL | 0 refills | Status: DC

## 2022-05-19 NOTE — Telephone Encounter (Signed)
Pt needs refill on adderall and vyvanse.

## 2022-06-04 ENCOUNTER — Telehealth: Payer: Self-pay | Admitting: Internal Medicine

## 2022-06-04 MED ORDER — LISDEXAMFETAMINE DIMESYLATE 60 MG PO CAPS: 60.0000 mg | ORAL_CAPSULE | ORAL | 0 refills | Status: DC

## 2022-06-04 NOTE — Telephone Encounter (Signed)
Pt called and wants to know if you can up her vyvanse to '60mg'$  since the kids are out for the summer she feels she needs a higher strength

## 2022-06-04 NOTE — Telephone Encounter (Signed)
Pt states that she is having trouble keeping up with the kids and completing tasks done around the house. Pt states she takes her pill at 8am and its gone by lunchtime (12 noon). Please advise

## 2022-06-04 NOTE — Telephone Encounter (Signed)
She called wanting a higher dose of Vyvanse.  Now that the kids are all at home she has more stimulation and thinks that she needs better focus.  I will bump her up to the 60 mg and see if that allows her to stay focused.  Also questioned her about that duration of this and so we will keep track of that as well.

## 2022-06-24 ENCOUNTER — Telehealth: Payer: Self-pay | Admitting: Family Medicine

## 2022-06-24 ENCOUNTER — Other Ambulatory Visit: Payer: Self-pay | Admitting: Medical

## 2022-06-24 MED ORDER — AMPHETAMINE-DEXTROAMPHETAMINE 15 MG PO TABS
15.0000 mg | ORAL_TABLET | Freq: Every day | ORAL | 0 refills | Status: DC
Start: 2022-06-24 — End: 2022-07-21

## 2022-06-24 NOTE — Telephone Encounter (Signed)
Pt called for refills of adderall. Pt advise [facility] out of town and would be sent to a different provider. Please send to [facility]. Pt can be reached [phone].

## 2022-06-28 ENCOUNTER — Other Ambulatory Visit: Payer: Self-pay | Admitting: Family Medicine

## 2022-06-28 DIAGNOSIS — F902 Attention-deficit hyperactivity disorder, combined type: Secondary | ICD-10-CM

## 2022-06-28 NOTE — Telephone Encounter (Signed)
[facility] is requesting to fill pt vyvanse. Please advise [facility]

## 2022-06-29 ENCOUNTER — Telehealth: Payer: Self-pay

## 2022-06-29 ENCOUNTER — Telehealth: Payer: Self-pay | Admitting: Family Medicine

## 2022-06-29 MED ORDER — LISDEXAMFETAMINE DIMESYLATE 60 MG PO CAPS: 60.0000 mg | ORAL_CAPSULE | ORAL | 0 refills | Status: DC

## 2022-06-29 NOTE — Telephone Encounter (Signed)
Pt's pharmacy is out of Vyvanse '60mg'$  and the [facility] on [address] has it in stock, please sent Rx there

## 2022-06-29 NOTE — Telephone Encounter (Signed)
Called pt to advised that she could pick up her med on the 14th of this month. Pt advised that [facility] told her that they were out of the medicine and not sure when they will have it in. Pt found it at [facility] on [address] and will need a new script sent over to them for the 14th. [place]

## 2022-06-29 NOTE — Telephone Encounter (Signed)
Pt. Called needs a refill on her Vyvanse '60mg'$  to [place] last apt was 05/04/22 next apt 05/17/23.

## 2022-07-02 NOTE — Telephone Encounter (Signed)
done

## 2022-07-21 ENCOUNTER — Telehealth: Payer: Self-pay | Admitting: Family Medicine

## 2022-07-21 MED ORDER — AMPHETAMINE-DEXTROAMPHETAMINE 15 MG PO TABS: 15.0000 mg | ORAL_TABLET | Freq: Every day | ORAL | 0 refills | Status: DC

## 2022-07-21 NOTE — Telephone Encounter (Signed)
Pt called and needs a refill for her adderall please send to the [place], [place]

## 2022-07-28 ENCOUNTER — Encounter: Payer: Self-pay | Admitting: Internal Medicine

## 2022-08-05 ENCOUNTER — Telehealth: Payer: Self-pay | Admitting: Family Medicine

## 2022-08-05 DIAGNOSIS — F902 Attention-deficit hyperactivity disorder, combined type: Secondary | ICD-10-CM

## 2022-08-05 MED ORDER — LISDEXAMFETAMINE DIMESYLATE 60 MG PO CAPS: 60.0000 mg | ORAL_CAPSULE | ORAL | 0 refills | Status: DC

## 2022-08-05 NOTE — Telephone Encounter (Signed)
Pt requesting refill on her vyvanse 60 mg to [facility] ([facility] [place] - [place]

## 2022-08-06 ENCOUNTER — Telehealth: Payer: Self-pay | Admitting: Family Medicine

## 2022-08-06 DIAGNOSIS — F902 Attention-deficit hyperactivity disorder, combined type: Secondary | ICD-10-CM

## 2022-08-06 MED ORDER — LISDEXAMFETAMINE DIMESYLATE 60 MG PO CAPS: 60.0000 mg | ORAL_CAPSULE | ORAL | 0 refills | Status: DC

## 2022-08-06 NOTE — Telephone Encounter (Signed)
Pt says all pharmacies are out of Vyvanse 60 mg right now but her pharmacy has Vyvanse 50 mg, she is wondering if you can change the prescription to 50 mg so she can have something because she is currently out.

## 2022-08-11 ENCOUNTER — Telehealth: Payer: Self-pay | Admitting: Family Medicine

## 2022-08-11 MED ORDER — AMPHETAMINE-DEXTROAMPHETAMINE 12.5 MG PO TABS: 12.5000 mg | ORAL_TABLET | Freq: Every day | ORAL | 0 refills | Status: DC

## 2022-08-11 NOTE — Telephone Encounter (Signed)
Pt called and states that due to increase in Vyvanse she would like to go down in dose of adderall. She currently takes 15 mg and would like to go back to 12.5 mg. Please send new dose of adderall to [facility]. Pt can be reached at [phone].

## 2022-08-11 NOTE — Telephone Encounter (Signed)
She is now getting a higher dose of Vyvanse and would like to cut back on her Adderall.  Apparently the 15 mg has interfered with sleep.

## 2022-08-16 ENCOUNTER — Other Ambulatory Visit (HOSPITAL_COMMUNITY): Payer: Self-pay

## 2022-08-16 ENCOUNTER — Telehealth: Payer: Self-pay | Admitting: Family Medicine

## 2022-08-16 DIAGNOSIS — F902 Attention-deficit hyperactivity disorder, combined type: Secondary | ICD-10-CM

## 2022-08-16 MED ORDER — LISDEXAMFETAMINE DIMESYLATE 60 MG PO CAPS
60.0000 mg | ORAL_CAPSULE | ORAL | 0 refills | Status: DC
  Filled 2022-08-16 (×2): qty 30, 30d supply, fill #0
  Filled 2022-08-20: qty 19, 19d supply, fill #0

## 2022-08-16 NOTE — Telephone Encounter (Signed)
Pt called and states that Vyvance 60 mg is on national back order and she can not fill. She is requesting that she be prescribed Vyvance 50 mg which her pharmacy, [place], has in stock. Pt can be reached at [phone].

## 2022-08-20 ENCOUNTER — Other Ambulatory Visit (HOSPITAL_COMMUNITY): Payer: Self-pay

## 2022-08-31 ENCOUNTER — Encounter: Payer: Self-pay | Admitting: Internal Medicine

## 2022-09-03 ENCOUNTER — Telehealth: Payer: Self-pay | Admitting: Family Medicine

## 2022-09-03 MED ORDER — LISDEXAMFETAMINE DIMESYLATE 50 MG PO CAPS
50.0000 mg | ORAL_CAPSULE | Freq: Every day | ORAL | 0 refills | Status: DC
  Filled 2022-09-03: qty 30, 30d supply, fill #0

## 2022-09-03 NOTE — Telephone Encounter (Signed)
Pt called & states by the time she got to [facility] pharmacy to pick up the Vyvanse '60mg'$  they only had #18.  She would like to just switch to '50mg'$  Vyvanse until the national back order issue is resolved.  So she doesn't have this issue every month calling around to try and find the '60mg'$ .  Please send in Vyvanse '50mg'$  to [place]

## 2022-09-06 ENCOUNTER — Other Ambulatory Visit (HOSPITAL_COMMUNITY): Payer: Self-pay

## 2022-09-09 ENCOUNTER — Telehealth: Payer: Self-pay | Admitting: Family Medicine

## 2022-09-09 DIAGNOSIS — F410 Panic disorder [episodic paroxysmal anxiety] without agoraphobia: Secondary | ICD-10-CM

## 2022-09-09 MED ORDER — SERTRALINE HCL 50 MG PO TABS: 50.0000 mg | ORAL_TABLET | Freq: Every day | ORAL | 3 refills | Status: DC

## 2022-09-09 MED ORDER — AMPHETAMINE-DEXTROAMPHETAMINE 12.5 MG PO TABS: 12.5000 mg | ORAL_TABLET | Freq: Every day | ORAL | 0 refills | Status: DC

## 2022-09-09 NOTE — Telephone Encounter (Signed)
Pt called for refills of zoloft and adderall. Please send to [facility]. PT can be reached at [phone].

## 2022-09-13 ENCOUNTER — Encounter: Payer: Self-pay | Admitting: Internal Medicine

## 2022-09-21 ENCOUNTER — Telehealth: Payer: Self-pay | Admitting: Family Medicine

## 2022-09-21 ENCOUNTER — Other Ambulatory Visit (HOSPITAL_COMMUNITY): Payer: Self-pay

## 2022-09-21 MED ORDER — LISDEXAMFETAMINE DIMESYLATE 60 MG PO CAPS
60.0000 mg | ORAL_CAPSULE | ORAL | 0 refills | Status: DC
  Filled 2022-09-21: qty 30, 30d supply, fill #0

## 2022-09-21 NOTE — Telephone Encounter (Signed)
She needs Vyvanse 60 mg sent to [facility] instead of [facility]  She has verified they have it

## 2022-09-21 NOTE — Telephone Encounter (Signed)
Pt called & states [facility] pharmacy is getting the Vyvanse '60mg'$  back in today please send refill there

## 2022-09-22 MED ORDER — LISDEXAMFETAMINE DIMESYLATE 60 MG PO CAPS: 60.0000 mg | ORAL_CAPSULE | ORAL | 0 refills | Status: DC

## 2022-10-08 ENCOUNTER — Telehealth: Payer: Self-pay | Admitting: Family Medicine

## 2022-10-08 ENCOUNTER — Other Ambulatory Visit: Payer: Self-pay | Admitting: Medical

## 2022-10-08 MED ORDER — AMPHETAMINE-DEXTROAMPHETAMINE 12.5 MG PO TABS: 12.5000 mg | ORAL_TABLET | Freq: Every day | ORAL | 0 refills | Status: DC

## 2022-10-08 NOTE — Telephone Encounter (Signed)
Pt requesting a refill on adderall to  [place], [place]

## 2022-10-18 ENCOUNTER — Telehealth: Payer: Self-pay | Admitting: Family Medicine

## 2022-10-18 MED ORDER — LISDEXAMFETAMINE DIMESYLATE 60 MG PO CAPS: 60.0000 mg | ORAL_CAPSULE | ORAL | 0 refills | Status: DC

## 2022-10-18 NOTE — Telephone Encounter (Signed)
Pt requesting refill on vyvanse to [facility] ([facility] [place]

## 2022-10-19 ENCOUNTER — Ambulatory Visit (INDEPENDENT_AMBULATORY_CARE_PROVIDER_SITE_OTHER): Payer: Medicaid Other | Admitting: Family Medicine

## 2022-10-19 VITALS — BP 120/80 | HR 80 | Ht 63.0 in | Wt 145.4 lb

## 2022-10-19 DIAGNOSIS — F902 Attention-deficit hyperactivity disorder, combined type: Secondary | ICD-10-CM | POA: Diagnosis not present

## 2022-10-19 DIAGNOSIS — Z23 Encounter for immunization: Secondary | ICD-10-CM | POA: Diagnosis not present

## 2022-10-19 DIAGNOSIS — F341 Dysthymic disorder: Secondary | ICD-10-CM

## 2022-10-19 DIAGNOSIS — F410 Panic disorder [episodic paroxysmal anxiety] without agoraphobia: Secondary | ICD-10-CM

## 2022-10-19 DIAGNOSIS — Z8673 Personal history of transient ischemic attack (TIA), and cerebral infarction without residual deficits: Secondary | ICD-10-CM | POA: Diagnosis not present

## 2022-10-19 DIAGNOSIS — I10 Essential (primary) hypertension: Secondary | ICD-10-CM

## 2022-10-19 MED ORDER — LISDEXAMFETAMINE DIMESYLATE 60 MG PO CAPS: 60.0000 mg | ORAL_CAPSULE | ORAL | 0 refills | Status: DC

## 2022-10-19 MED ORDER — SERTRALINE HCL 50 MG PO TABS: 50.0000 mg | ORAL_TABLET | Freq: Every day | ORAL | 3 refills | Status: DC

## 2022-10-19 MED ORDER — LOSARTAN POTASSIUM-HCTZ 50-12.5 MG PO TABS: 1.0000 | ORAL_TABLET | Freq: Every day | ORAL | 3 refills | Status: DC

## 2022-10-19 NOTE — Progress Notes (Signed)
   Subjective:    Patient ID: [name], female    DOB: 20-Apr-1981, 41 y.o.   MRN: [id number]  HPI She is here for med check visit.  She does use Vyvanse and Adderall.  She usually gets 7 hours of benefit of the Vyvanse.  Recently she did Take extra of Vyvanse.  She does use the Adderall usually later on in the afternoon to make sure she can get through the whole early evening to feed and help the kids.  She continues on Zoloft which seems to be helping keeping her in a good psychological frame of mind.  She continues on losartan/HCTZ for her blood pressure.  She does have a remote history of CVA  Review of Systems     Objective:   Physical Exam Alert and in no distress. Tympanic membranes and canals are normal. Pharyngeal area is normal. Neck is supple without adenopathy or thyromegaly. Cardiac exam shows a regular sinus rhythm without murmurs or gallops. Lungs are clear to auscultation.        Assessment & Plan:  Attention deficit hyperactivity disorder (ADHD), combined type - Plan: lisdexamfetamine (VYVANSE) 60 MG capsule  Need for influenza vaccination - Plan: Flu Vaccine QUAD 6+ mos PF IM (Fluarix Quad PF)  History of CVA (cerebrovascular accident)  Dysthymia  Panic disorder - Plan: sertraline (ZOLOFT) 50 MG tablet  Essential hypertension - Plan: losartan-hydrochlorothiazide (HYZAAR) 50-12.5 MG tablet Her medications were renewed.  Discussed the proper use of Vyvanse and if she needs extra coverage she should use the Adderall instead of the Vyvanse.  Continue on Zoloft to help with the dysthymia and previous history of panic disorder.  She will follow-up with her gynecologist regularly.

## 2022-11-08 ENCOUNTER — Telehealth: Payer: Self-pay | Admitting: Internal Medicine

## 2022-11-08 MED ORDER — AMPHETAMINE-DEXTROAMPHETAMINE 12.5 MG PO TABS: 12.5000 mg | ORAL_TABLET | Freq: Every day | ORAL | 0 refills | Status: DC

## 2022-11-08 NOTE — Telephone Encounter (Signed)
Pt needs a refill on adderall to [facility]

## 2022-11-16 ENCOUNTER — Telehealth: Payer: Self-pay | Admitting: Family Medicine

## 2022-11-16 DIAGNOSIS — F902 Attention-deficit hyperactivity disorder, combined type: Secondary | ICD-10-CM

## 2022-11-16 MED ORDER — LISDEXAMFETAMINE DIMESYLATE 60 MG PO CAPS: 60.0000 mg | ORAL_CAPSULE | ORAL | 0 refills | Status: DC

## 2022-11-16 NOTE — Telephone Encounter (Signed)
Pt informed

## 2022-11-16 NOTE — Telephone Encounter (Signed)
Pt needs Vyvanse '60mg'$    [facility]

## 2022-11-16 NOTE — Telephone Encounter (Signed)
Pt called & asked if she could get her Vyvanse filled today, states she is out, with the holidays & stress & everything she must have taken 2 extra because she doesn't have anymore left.

## 2022-12-07 ENCOUNTER — Other Ambulatory Visit: Payer: Self-pay | Admitting: Family Medicine

## 2022-12-07 ENCOUNTER — Telehealth: Payer: Self-pay | Admitting: Family Medicine

## 2022-12-07 MED ORDER — AMPHETAMINE-DEXTROAMPHETAMINE 12.5 MG PO TABS: 12.5000 mg | ORAL_TABLET | Freq: Every day | ORAL | 0 refills | Status: DC

## 2022-12-07 NOTE — Telephone Encounter (Signed)
[name] needs refill on adderall to  [place], [place]

## 2022-12-13 ENCOUNTER — Telehealth: Payer: Self-pay | Admitting: Family Medicine

## 2022-12-13 MED ORDER — LISDEXAMFETAMINE DIMESYLATE 50 MG PO CAPS: 50.0000 mg | ORAL_CAPSULE | Freq: Every day | ORAL | 0 refills | Status: DC

## 2022-12-13 NOTE — Telephone Encounter (Signed)
[name] called and would like to decrease her vyvanse to 50 mg.

## 2022-12-13 NOTE — Telephone Encounter (Signed)
The 60 mg Vyvanse was apparently making her too jittery but not really helping with focus and she would like to back off to the 50 mg.

## 2022-12-31 ENCOUNTER — Telehealth: Payer: Self-pay | Admitting: Family Medicine

## 2022-12-31 MED ORDER — AMPHETAMINE-DEXTROAMPHETAMINE 12.5 MG PO TABS: 12.5000 mg | ORAL_TABLET | Freq: Every day | ORAL | 0 refills | Status: DC

## 2022-12-31 NOTE — Telephone Encounter (Signed)
Pt called and is requesting a refill for her adderall please send to the  [place], [place]

## 2022-12-31 NOTE — Telephone Encounter (Signed)
[name] called and wants to go back to [facility] for her adderall, she says since she went down doses on Vyvanse she feels like it would work better this way. She prefers [facility]

## 2023-01-04 ENCOUNTER — Telehealth: Payer: Medicaid Other | Admitting: Family Medicine

## 2023-01-04 ENCOUNTER — Encounter: Payer: Self-pay | Admitting: Family Medicine

## 2023-01-04 VITALS — Ht 64.0 in | Wt 140.0 lb

## 2023-01-04 DIAGNOSIS — F902 Attention-deficit hyperactivity disorder, combined type: Secondary | ICD-10-CM | POA: Diagnosis not present

## 2023-01-04 MED ORDER — AMPHETAMINE-DEXTROAMPHETAMINE 15 MG PO TABS: 15.0000 mg | ORAL_TABLET | Freq: Every day | ORAL | 0 refills | Status: DC

## 2023-01-04 NOTE — Progress Notes (Signed)
   Subjective:    Patient ID: [name], female    DOB: Apr 09, 1981, 42 y.o.   MRN: [id number]  HPI Documentation for virtual audio and video telecommunications through [place] encounter: The patient was located at home. 2 patient identifiers used.  The provider was located in the office. The patient did consent to this visit and is aware of possible charges through their insurance for this visit.   The other persons participating in this telemedicine service were none. Time spent on call was 5 minutes and in review of previous records >15 minutes total for counseling and coordination of care. This virtual service is not related to other E/M service within previous 7 days.  She has a history of ADHD and presently is taking Vyvanse.  She was taking it at 60 mg but states that it made her feel "fuzzy" then we backed off to 50 mg which worked better for her focus but did not cause any side effects.  It would last roughly 6 hours.  She would then have to take a shorter acting Adderall at 12.5 mg when she was taking the 60 of the Vyvanse.  Since she is back down to 50 the 12.5 mg has not been as effective and would like to go to the 15 mg.  Usually the shorter acting 1 lasts 2 to 3 hours which is what she needs to do to get through the day and take care of the kids when they come home from school. Review of Systems     Objective:   Physical Exam Alert and in no distress otherwise not examined       Assessment & Plan:  Attention deficit hyperactivity disorder (ADHD), combined type Continue on the 50 mg Vyvanse and I will increase the short acting 1-15 and see how she tolerates this combination.

## 2023-01-10 ENCOUNTER — Telehealth: Payer: Self-pay | Admitting: Family Medicine

## 2023-01-10 NOTE — Telephone Encounter (Signed)
[name] requesting a refill on vyvanse to  [place], [place]

## 2023-01-11 MED ORDER — LISDEXAMFETAMINE DIMESYLATE 50 MG PO CAPS: 50.0000 mg | ORAL_CAPSULE | Freq: Every day | ORAL | 0 refills | Status: DC

## 2023-01-31 ENCOUNTER — Telehealth: Payer: Self-pay | Admitting: Family Medicine

## 2023-01-31 MED ORDER — AMPHETAMINE-DEXTROAMPHETAMINE 15 MG PO TABS: 15.0000 mg | ORAL_TABLET | Freq: Every day | ORAL | 0 refills | Status: DC

## 2023-01-31 NOTE — Telephone Encounter (Signed)
[name] requesting refill on adderall to [place], [place]

## 2023-02-07 ENCOUNTER — Other Ambulatory Visit: Payer: Self-pay

## 2023-02-07 MED ORDER — LISDEXAMFETAMINE DIMESYLATE 50 MG PO CAPS: 50.0000 mg | ORAL_CAPSULE | Freq: Every day | ORAL | 0 refills | Status: DC

## 2023-02-07 NOTE — Telephone Encounter (Signed)
Pt requesting refill of Vyvanse. Last sent on 01/13/23 30 capsules with 0 refills. Rx pinned.

## 2023-02-08 ENCOUNTER — Telehealth: Payer: Self-pay

## 2023-02-08 NOTE — Telephone Encounter (Signed)
Called pt to advise refill of vyvanse was sent in. Left vm.

## 2023-02-28 ENCOUNTER — Telehealth: Payer: Self-pay | Admitting: Family Medicine

## 2023-02-28 MED ORDER — AMPHETAMINE-DEXTROAMPHETAMINE 15 MG PO TABS: 15.0000 mg | ORAL_TABLET | Freq: Every day | ORAL | 0 refills | Status: DC

## 2023-02-28 NOTE — Telephone Encounter (Signed)
Pt called for refills of adderall 15 mg. Please send to [facility]. Pt can be reached at [phone].

## 2023-03-14 ENCOUNTER — Telehealth: Payer: Self-pay

## 2023-03-14 MED ORDER — LISDEXAMFETAMINE DIMESYLATE 50 MG PO CAPS: 50.0000 mg | ORAL_CAPSULE | Freq: Every day | ORAL | 0 refills | Status: DC

## 2023-03-14 NOTE — Telephone Encounter (Signed)
Pt called to request refill of Vyvanse to be sent to [facility]. Last sent on 02/11/23. Has upcoming appt on 05/17/23.

## 2023-03-14 NOTE — Addendum Note (Signed)
Addended by: Ronnald Nian on: 03/14/2023 12:55 PM   Modules accepted: Orders

## 2023-03-28 ENCOUNTER — Other Ambulatory Visit: Payer: Self-pay

## 2023-03-28 MED ORDER — AMPHETAMINE-DEXTROAMPHETAMINE 15 MG PO TABS: 15.0000 mg | ORAL_TABLET | Freq: Every day | ORAL | 0 refills | Status: DC

## 2023-03-28 NOTE — Telephone Encounter (Signed)
Refill request from pt for adderall.

## 2023-03-29 ENCOUNTER — Telehealth: Payer: Self-pay

## 2023-03-29 NOTE — Telephone Encounter (Signed)
Pharmacy staff notified ok to fill early. Pt aware.

## 2023-03-29 NOTE — Telephone Encounter (Signed)
[name] called to inquire if her refill for adderall can be refilled for the 9th instead of the 11th. She said she will be out of town.

## 2023-04-11 ENCOUNTER — Other Ambulatory Visit: Payer: Self-pay | Admitting: Medical

## 2023-04-11 ENCOUNTER — Telehealth: Payer: Self-pay | Admitting: Family Medicine

## 2023-04-11 MED ORDER — LISDEXAMFETAMINE DIMESYLATE 50 MG PO CAPS: 50.0000 mg | ORAL_CAPSULE | Freq: Every day | ORAL | 0 refills | Status: DC

## 2023-04-11 NOTE — Telephone Encounter (Signed)
Pt called for refills of Vyvanse. Please send to [facility]. Pt advise [facility] not in. Pt can be reached at [phone]. Sending to [place] as [facility] not in.

## 2023-04-25 ENCOUNTER — Telehealth: Payer: Self-pay | Admitting: Family Medicine

## 2023-04-25 NOTE — Telephone Encounter (Signed)
Pt going out of town and needs Adderall 15 mg on 6/6 at [facility].  Also wants to increase Vyvanse to 60 mg to tolerate her kids this summer while they are out of school.  Please advise pt when sent [phone]

## 2023-04-26 NOTE — Telephone Encounter (Signed)
Pt would like an update on this

## 2023-04-27 MED ORDER — LISDEXAMFETAMINE DIMESYLATE 60 MG PO CAPS
60.0000 mg | ORAL_CAPSULE | ORAL | 0 refills | Status: DC
Start: 2023-04-27 — End: 2023-05-23

## 2023-04-27 MED ORDER — AMPHETAMINE-DEXTROAMPHETAMINE 15 MG PO TABS: 15.0000 mg | ORAL_TABLET | Freq: Every day | ORAL | 0 refills | Status: DC

## 2023-04-27 NOTE — Telephone Encounter (Signed)
Female that her children are home for the summer, she thinks she might feel a little higher dose of the Vyvanse to help her focus since there is more things going on with the children.  She had a hard time describing this but we will try her on a higher dose to see if it will help.

## 2023-05-17 ENCOUNTER — Ambulatory Visit: Payer: Medicaid Other | Admitting: Family Medicine

## 2023-05-17 ENCOUNTER — Encounter: Payer: Self-pay | Admitting: Family Medicine

## 2023-05-17 VITALS — BP 118/80 | HR 72 | Temp 97.6°F | Resp 18 | Ht 63.0 in | Wt 145.0 lb

## 2023-05-17 DIAGNOSIS — F410 Panic disorder [episodic paroxysmal anxiety] without agoraphobia: Secondary | ICD-10-CM

## 2023-05-17 DIAGNOSIS — I10 Essential (primary) hypertension: Secondary | ICD-10-CM

## 2023-05-17 DIAGNOSIS — F341 Dysthymic disorder: Secondary | ICD-10-CM

## 2023-05-17 DIAGNOSIS — Z8673 Personal history of transient ischemic attack (TIA), and cerebral infarction without residual deficits: Secondary | ICD-10-CM | POA: Diagnosis not present

## 2023-05-17 DIAGNOSIS — E559 Vitamin D deficiency, unspecified: Secondary | ICD-10-CM | POA: Diagnosis not present

## 2023-05-17 DIAGNOSIS — Z0000 Encounter for general adult medical examination without abnormal findings: Secondary | ICD-10-CM

## 2023-05-17 DIAGNOSIS — F902 Attention-deficit hyperactivity disorder, combined type: Secondary | ICD-10-CM

## 2023-05-17 LAB — CBC WITH DIFFERENTIAL/PLATELET
Basos: 1 %
Hematocrit: 43 % (ref 34.0–46.6)
Lymphs: 36 %
MCHC: 34 g/dL (ref 31.5–35.7)
Monocytes: 6 %
WBC: 5.4 10*3/uL (ref 3.4–10.8)

## 2023-05-17 LAB — LIPID PANEL

## 2023-05-17 LAB — COMPREHENSIVE METABOLIC PANEL
CO2: 22 mmol/L (ref 20–29)
Calcium: 9.5 mg/dL (ref 8.7–10.2)
Potassium: 3.7 mmol/L (ref 3.5–5.2)

## 2023-05-17 LAB — VITAMIN D 25 HYDROXY (VIT D DEFICIENCY, FRACTURES)

## 2023-05-17 MED ORDER — LOSARTAN POTASSIUM-HCTZ 50-12.5 MG PO TABS: 1.0000 | ORAL_TABLET | Freq: Every day | ORAL | 3 refills | Status: DC

## 2023-05-17 MED ORDER — SERTRALINE HCL 50 MG PO TABS: 50.0000 mg | ORAL_TABLET | Freq: Every day | ORAL | 3 refills | Status: DC

## 2023-05-17 MED ORDER — AMPHETAMINE-DEXTROAMPHETAMINE 10 MG PO TABS
10.0000 mg | ORAL_TABLET | Freq: Every day | ORAL | 0 refills | Status: DC
Start: 2023-05-17 — End: 2023-05-24

## 2023-05-17 NOTE — Progress Notes (Signed)
Complete physical exam  Patient: [name]   DOB: Apr 12, 1981   42 y.o. Female  MRN: [id number]  Subjective:    Chief Complaint  Patient presents with   Annual Exam    Non Fasting. No additional concerns.     [name] is a 42 y.o. female who presents today for a complete physical exam. She reports consuming a general diet. The patient does not participate in regular exercise at present. She generally feels fairly well. She reports sleeping fairly well. She would like to try different strength of Adderall for the evening dosing.  The Vyvanse lasts roughly 8 hours however she needs extra coverage when the children come home to go to get through the afternoon and evening.  She had a difficult time explaining exactly what is going on thinking that it this 15 mg might need overstimulating her.  She does seem to have no trouble with sleep. She presently is on Zoloft to help with this time and does have a previous history of panic disorder.  She also has a history of CVA but has had no neurologic trouble.  She continues on losartan/HCTZ.  She follows up regularly with gynecology.  Her marriage and home life are going well.  Otherwise her family and social history as well as health maintenance immunizations was reviewed Most recent fall risk assessment:    05/17/2023    8:24 AM  Fall Risk   Falls in the past year? 0  Number falls in past yr: 0  Injury with Fall? 0  Risk for fall due to : No Fall Risks  Follow up Falls evaluation completed     Most recent depression screenings:    05/17/2023    8:24 AM 10/19/2022   10:36 AM  PHQ 2/9 Scores  PHQ - 2 Score 0 0  PHQ- 9 Score 0 0    Vision:Not within last year  and Dental: No current dental problems    Patient Care Team: Ronnald Nian, MD as PCP - General (Family Medicine)   Outpatient Medications Prior to Visit  Medication Sig Note   aspirin EC 81 MG tablet Take 1 tablet (81 mg total) by mouth daily.    Ergocalciferol  (VITAMIN D2 PO) Vitamin D2    lisdexamfetamine (VYVANSE) 60 MG capsule Take 1 capsule (60 mg total) by mouth every morning.    nystatin-triamcinolone (MYCOLOG II) cream  05/17/2023: prn     [DISCONTINUED] amphetamine-dextroamphetamine (ADDERALL) 15 MG tablet Take 1 tablet by mouth daily.    [DISCONTINUED] losartan-hydrochlorothiazide (HYZAAR) 50-12.5 MG tablet Take 1 tablet by mouth daily.    [DISCONTINUED] sertraline (ZOLOFT) 50 MG tablet Take 1 tablet (50 mg total) by mouth daily.    No facility-administered medications prior to visit.    Review of Systems  All other systems reviewed and are negative.         Objective:     BP 118/80   Pulse 72   Temp 97.6 F (36.4 C) (Oral)   Resp 18   Ht 5\' 3"  (1.6 m)   Wt 145 lb (65.8 kg)   LMP 05/10/2023   SpO2 99% Comment: room air  BMI 25.69 kg/m    Physical Exam  Alert and in no distress. Tympanic membranes and canals are normal. Pharyngeal area is normal. Neck is supple without adenopathy or thyromegaly. Cardiac exam shows a regular sinus rhythm without murmurs or gallops. Lungs are clear to auscultation.     Assessment & Plan:  Routine general medical examination at a health care facility - Plan: POCT URINALYSIS DIP (CLINITEK), CBC with Differential/Platelet, Comprehensive metabolic panel, Lipid panel  Vitamin D deficiency - Plan: VITAMIN D 25 Hydroxy (Vit-D Deficiency, Fractures)  History of CVA (cerebrovascular accident)  Essential hypertension - Plan: CBC with Differential/Platelet, Comprehensive metabolic panel, losartan-hydrochlorothiazide (HYZAAR) 50-12.5 MG tablet  Dysthymia  Attention deficit hyperactivity disorder (ADHD), combined type - Plan: amphetamine-dextroamphetamine (ADDERALL) 10 MG tablet  Panic disorder - Plan: sertraline (ZOLOFT) 50 MG tablet  Immunization History  Administered Date(s) Administered   Influenza Split 11/26/2011, 10/09/2012   Influenza,inj,Quad PF,6+ Mos 08/11/2020, 10/19/2022    Influenza-Unspecified 07/19/2021   Tdap 02/23/2016    Health Maintenance  Topic Date Due   INFLUENZA VACCINE  06/23/2023   PAP SMEAR-Modifier  11/19/2025   DTaP/Tdap/Td (2 - Td or Tdap) 02/22/2026   Hepatitis C Screening  Completed   HIV Screening  Completed   HPV VACCINES  Aged Out   COVID-19 Vaccine  Discontinued    Discussed health benefits of physical activity, and encouraged her to engage in regular exercise appropriate for her age and condition.  Problem List Items Addressed This Visit     ADHD (attention deficit hyperactivity disorder)   Relevant Medications   amphetamine-dextroamphetamine (ADDERALL) 10 MG tablet   Dysthymia   Relevant Medications   sertraline (ZOLOFT) 50 MG tablet   Essential hypertension   Relevant Medications   losartan-hydrochlorothiazide (HYZAAR) 50-12.5 MG tablet   Other Relevant Orders   CBC with Differential/Platelet   Comprehensive metabolic panel   History of CVA (cerebrovascular accident)   Panic disorder   Relevant Medications   sertraline (ZOLOFT) 50 MG tablet   Vitamin D deficiency   Relevant Orders   VITAMIN D 25 Hydroxy (Vit-D Deficiency, Fractures)   Other Visit Diagnoses     Routine general medical examination at a health care facility    -  Primary   Relevant Orders   POCT URINALYSIS DIP (CLINITEK)   CBC with Differential/Platelet   Comprehensive metabolic panel   Lipid panel      Various meds were reviewed however I will drop her Adderall down at her request.  She will keep me informed as to her worse.   Sharlot Gowda, MD

## 2023-05-18 LAB — LIPID PANEL
Chol/HDL Ratio: 2.9 ratio (ref 0.0–4.4)
HDL: 54 mg/dL (ref >39)
LDL Chol Calc (NIH): 82 mg/dL (ref 0–99)

## 2023-05-18 LAB — CBC WITH DIFFERENTIAL/PLATELET
Basophils Absolute: 0 10*3/uL (ref 0.0–0.2)
EOS (ABSOLUTE): 0.1 10*3/uL (ref 0.0–0.4)
Eos: 2 %
Hemoglobin: 14.6 g/dL (ref 11.1–15.9)
Immature Grans (Abs): 0 10*3/uL (ref 0.0–0.1)
Immature Granulocytes: 0 %
Lymphocytes Absolute: 2 10*3/uL (ref 0.7–3.1)
MCH: 30.9 pg (ref 26.6–33.0)
MCV: 91 fL (ref 79–97)
Monocytes Absolute: 0.3 10*3/uL (ref 0.1–0.9)
Neutrophils Absolute: 3 10*3/uL (ref 1.4–7.0)
Neutrophils: 55 %
Platelets: 248 10*3/uL (ref 150–450)
RBC: 4.73 x10E6/uL (ref 3.77–5.28)
RDW: 12.7 % (ref 11.7–15.4)

## 2023-05-18 LAB — COMPREHENSIVE METABOLIC PANEL
ALT: 14 IU/L (ref 0–32)
AST: 19 IU/L (ref 0–40)
Albumin: 4.7 g/dL (ref 3.9–4.9)
Alkaline Phosphatase: 74 IU/L (ref 44–121)
BUN/Creatinine Ratio: 19 (ref 9–23)
BUN: 14 mg/dL (ref 6–20)
Bilirubin Total: 0.4 mg/dL (ref 0.0–1.2)
Chloride: 104 mmol/L (ref 96–106)
Creatinine, Ser: 0.73 mg/dL (ref 0.57–1.00)
Globulin, Total: 2 g/dL (ref 1.5–4.5)
Glucose: 76 mg/dL (ref 70–99)
Sodium: 141 mmol/L (ref 134–144)
Total Protein: 6.7 g/dL (ref 6.0–8.5)
eGFR: 107 mL/min/{1.73_m2} (ref >59)

## 2023-05-23 ENCOUNTER — Other Ambulatory Visit: Payer: Self-pay | Admitting: Family Medicine

## 2023-05-23 ENCOUNTER — Telehealth: Payer: Self-pay | Admitting: Family Medicine

## 2023-05-23 DIAGNOSIS — F902 Attention-deficit hyperactivity disorder, combined type: Secondary | ICD-10-CM

## 2023-05-23 MED ORDER — LISDEXAMFETAMINE DIMESYLATE 60 MG PO CAPS: 60.0000 mg | ORAL_CAPSULE | ORAL | 0 refills | Status: DC

## 2023-05-23 NOTE — Telephone Encounter (Signed)
Pt needs refill Vyvanse to [facility] would like written for 3rd because pharmacy closed on 4th.

## 2023-05-24 MED ORDER — AMPHETAMINE-DEXTROAMPHETAMINE 10 MG PO TABS: 10.0000 mg | ORAL_TABLET | Freq: Every day | ORAL | 0 refills | Status: DC

## 2023-05-24 NOTE — Addendum Note (Signed)
Addended by: Ronnald Nian on: 05/24/2023 09:56 PM   Modules accepted: Orders

## 2023-05-25 ENCOUNTER — Telehealth: Payer: Self-pay | Admitting: Family Medicine

## 2023-05-25 ENCOUNTER — Other Ambulatory Visit: Payer: Self-pay | Admitting: Medical

## 2023-05-25 MED ORDER — LISDEXAMFETAMINE DIMESYLATE 60 MG PO CAPS: 60.0000 mg | ORAL_CAPSULE | ORAL | 0 refills | Status: DC

## 2023-05-25 NOTE — Telephone Encounter (Signed)
[name], can you please fix this vyvanse prescription for her. She wants to make sure she can pick it up before the holiday tomorrow at  [facility] - [place], [place] - [address]

## 2023-06-02 ENCOUNTER — Telehealth: Payer: Self-pay | Admitting: Family Medicine

## 2023-06-02 MED ORDER — AMPHETAMINE-DEXTROAMPHETAMINE 20 MG PO TABS: 20.0000 mg | ORAL_TABLET | Freq: Every day | ORAL | 0 refills | Status: DC

## 2023-06-02 NOTE — Telephone Encounter (Signed)
Charlet called and says you all discussed increasing her adderall at last visit and she would like to increase it to 20 mg. Uses [facility] - [place], [place] - [address]

## 2023-06-20 ENCOUNTER — Telehealth: Payer: Self-pay | Admitting: Family Medicine

## 2023-06-20 MED ORDER — LISDEXAMFETAMINE DIMESYLATE 60 MG PO CAPS: 60.0000 mg | ORAL_CAPSULE | ORAL | 0 refills | Status: DC

## 2023-06-20 NOTE — Telephone Encounter (Signed)
Ruble called again and asks if the vyvanse can also be decreased to 50 mg since her adderall was increased.

## 2023-06-20 NOTE — Telephone Encounter (Signed)
Pt requesting Vyvanse refill  pt knows a little early but was calling in for son's refill

## 2023-06-20 NOTE — Telephone Encounter (Signed)
Pt called back about Vyvanse rx   she last had it filled on 7/3 and you dated for 8/5 She wants to see if you can change to 8/2 because 8/3 is a Sunday and her pharmacy is closed on Sundays

## 2023-06-21 NOTE — Telephone Encounter (Signed)
Informed pt that she should have enough pills since it was filled early in July. Patient states that she does not that she had to work July 4th and she took extra   She was also told to mention Vyvanse decrease when she calls for next rx

## 2023-07-04 ENCOUNTER — Telehealth: Payer: Self-pay | Admitting: Family Medicine

## 2023-07-04 MED ORDER — AMPHETAMINE-DEXTROAMPHETAMINE 20 MG PO TABS: 20.0000 mg | ORAL_TABLET | Freq: Every day | ORAL | 0 refills | Status: DC

## 2023-07-04 NOTE — Telephone Encounter (Signed)
Pt called and req Adderall 20 mg refill to [facility] at [address]

## 2023-07-12 ENCOUNTER — Telehealth: Payer: Self-pay | Admitting: Family Medicine

## 2023-07-12 MED ORDER — LISDEXAMFETAMINE DIMESYLATE 50 MG PO CAPS
50.0000 mg | ORAL_CAPSULE | Freq: Every day | ORAL | 0 refills | Status: DC
Start: 2023-07-12 — End: 2023-08-04

## 2023-07-12 NOTE — Telephone Encounter (Signed)
[name] would like her vyvanse to be decrease to 50 mg to  [facility] - [place], [place] - [address]

## 2023-07-26 ENCOUNTER — Telehealth: Payer: Self-pay | Admitting: Family Medicine

## 2023-07-26 MED ORDER — AMPHETAMINE-DEXTROAMPHETAMINE 15 MG PO TABS: 15.0000 mg | ORAL_TABLET | Freq: Every day | ORAL | 0 refills | Status: DC

## 2023-07-26 NOTE — Telephone Encounter (Signed)
I have readjusted her Vyvanse and Adderall based on the fact that she gets good focus on lower dosing.  This usually changes depending upon whether it is in the school year or not.

## 2023-07-26 NOTE — Telephone Encounter (Signed)
Pt called and is requesting that her adderall be decreased from 20mg  to 15mg  please send tot he [facility] - [place], [place] - [address]

## 2023-08-04 ENCOUNTER — Encounter: Payer: Self-pay | Admitting: Family Medicine

## 2023-08-04 ENCOUNTER — Telehealth (INDEPENDENT_AMBULATORY_CARE_PROVIDER_SITE_OTHER): Payer: Medicaid Other | Admitting: Family Medicine

## 2023-08-04 VITALS — Ht 63.0 in | Wt 140.0 lb

## 2023-08-04 DIAGNOSIS — F902 Attention-deficit hyperactivity disorder, combined type: Secondary | ICD-10-CM

## 2023-08-04 MED ORDER — LISDEXAMFETAMINE DIMESYLATE 60 MG PO CAPS
60.0000 mg | ORAL_CAPSULE | ORAL | 0 refills | Status: AC
Start: 2023-08-04 — End: ?

## 2023-08-04 NOTE — Progress Notes (Signed)
   Subjective:    Patient ID: [name], female    DOB: 1981/04/05, 42 y.o.   MRN: [id number]  HPI Documentation for virtual audio and video telecommunications through [facility] encounter:  The patient was located at home. 2 patient identifiers used.  The provider was located in the office. The patient did consent to this visit and is aware of possible charges through their insurance for this visit. The other persons participating in this telemedicine service were none. Time spent on call was 5 minutes and in review of previous records >15 minutes total for counseling and coordination of care. This virtual service is not related to other E/M service within previous 7 days.  She is here to discuss her ADD medication.  She did try to adjust the medication based on summer versus school year thinking that she might,since the kids are in school need less medication during the day but finds out she still needs to be stay focused during the whole day to get all of her ADLs done.  She still uses the 15 mg around 230 or 3 in the afternoon to help with focus for when the kids are at home, getting schoolwork done and feeding them.  Review of Systems     Objective:    Physical Exam Alert and in no distress otherwise not examined       Assessment & Plan:  Attention deficit hyperactivity disorder (ADHD), combined type I will keep her on the 60 mg Vyvanse and supplement that with the 15 mg later in the day.

## 2023-08-15 ENCOUNTER — Telehealth: Payer: Self-pay | Admitting: Family Medicine

## 2023-08-15 NOTE — Telephone Encounter (Signed)
Pt is requesting a refill on adderall but also she wants to increase back to 20 mg because she can tell the difference with her focus and attention span. She uses  [facility] - [place], [place] - [address]

## 2023-08-16 ENCOUNTER — Other Ambulatory Visit: Payer: Self-pay | Admitting: Medical

## 2023-08-16 NOTE — Telephone Encounter (Signed)
Pt called back requesting 20 mg adderall. Last appt. 08/04/23. Next appt. 05/17/24

## 2023-08-23 ENCOUNTER — Telehealth: Payer: Self-pay | Admitting: Family Medicine

## 2023-08-23 DIAGNOSIS — F902 Attention-deficit hyperactivity disorder, combined type: Secondary | ICD-10-CM

## 2023-08-23 MED ORDER — AMPHETAMINE-DEXTROAMPHETAMINE 20 MG PO TABS: 20.0000 mg | ORAL_TABLET | Freq: Every day | ORAL | 0 refills | Status: DC

## 2023-08-23 MED ORDER — LISDEXAMFETAMINE DIMESYLATE 60 MG PO CAPS
60.0000 mg | ORAL_CAPSULE | ORAL | 0 refills | Status: DC
Start: 2023-08-23 — End: 2023-09-20

## 2023-08-23 NOTE — Telephone Encounter (Signed)
Pt is requesting a refill on adderall but also she wants to increase back to 20 mg because she can tell the difference with her focus and attention span. She uses  [facility] - [place], [place] - [address]

## 2023-08-29 ENCOUNTER — Telehealth: Payer: Self-pay | Admitting: Family Medicine

## 2023-08-29 NOTE — Telephone Encounter (Signed)
P.A. Arlyce Harman received, called pharmacy, issue was just fill too early

## 2023-08-30 ENCOUNTER — Telehealth: Payer: Self-pay | Admitting: Family Medicine

## 2023-08-30 NOTE — Telephone Encounter (Signed)
Per pharmacy pt can not fill until 09/02/23.  Pt called today asking about switching pharmacy because her Vyvanse wasn't in.  I explained that it was an insurance issue that was too soon to fill and insurance wouldn't pay until the 09/02/23, she said ok .

## 2023-09-20 ENCOUNTER — Telehealth: Payer: Self-pay | Admitting: Family Medicine

## 2023-09-20 DIAGNOSIS — F902 Attention-deficit hyperactivity disorder, combined type: Secondary | ICD-10-CM

## 2023-09-20 MED ORDER — LISDEXAMFETAMINE DIMESYLATE 60 MG PO CAPS
60.0000 mg | ORAL_CAPSULE | ORAL | 0 refills | Status: DC
Start: 2023-09-23 — End: 2023-10-19

## 2023-09-20 MED ORDER — AMPHETAMINE-DEXTROAMPHETAMINE 20 MG PO TABS: 20.0000 mg | ORAL_TABLET | Freq: Every day | ORAL | 0 refills | Status: DC

## 2023-09-20 NOTE — Telephone Encounter (Signed)
Pt requesting refill on adderall to [facility] - [place], [place] - [address]

## 2023-10-19 ENCOUNTER — Telehealth: Payer: Self-pay | Admitting: Family Medicine

## 2023-10-19 DIAGNOSIS — F902 Attention-deficit hyperactivity disorder, combined type: Secondary | ICD-10-CM

## 2023-10-19 MED ORDER — LISDEXAMFETAMINE DIMESYLATE 60 MG PO CAPS
60.0000 mg | ORAL_CAPSULE | ORAL | 0 refills | Status: DC
Start: 2023-10-22 — End: 2023-11-08

## 2023-10-19 MED ORDER — AMPHETAMINE-DEXTROAMPHETAMINE 20 MG PO TABS: 20.0000 mg | ORAL_TABLET | Freq: Every day | ORAL | 0 refills | Status: DC

## 2023-10-19 NOTE — Telephone Encounter (Signed)
[facility] will be closed on Sunday 12/1 when her Vyvanse and Adderall will be due.  Can you back it up one day 11/30 so she can pick them up on that Saturday?

## 2023-11-07 ENCOUNTER — Telehealth: Payer: Self-pay | Admitting: Family Medicine

## 2023-11-07 NOTE — Telephone Encounter (Signed)
Pt is asking if her Vyvanse can be lowered to 50 mg ans sent into  [facility] - [place], [place] - [address]

## 2023-11-08 ENCOUNTER — Telehealth: Payer: Self-pay | Admitting: Family Medicine

## 2023-11-08 MED ORDER — LISDEXAMFETAMINE DIMESYLATE 50 MG PO CAPS: 50.0000 mg | ORAL_CAPSULE | Freq: Every day | ORAL | 0 refills | Status: DC

## 2023-11-08 NOTE — Telephone Encounter (Signed)
Called pt and advised her that she could not get 50 mg Vyvanse refilled until her normal refill date per insurance.  Pt wants to know if she can go down on the adderall to 15 mg instead of going down on Vyvanse, Advised her may be same issue with insurance but I will send back note

## 2023-11-08 NOTE — Telephone Encounter (Signed)
States that the Vyvanse 60 mg is making her slightly more agitated and would like to just drop down 1 notch to the 50 mg.

## 2023-11-08 NOTE — Telephone Encounter (Signed)
Pt called re Vyvanse denial for 50 mg rx, she is wanting sent to different pharmacy.  I called and spoke to pharmacist at [facility] and he states that it is not a PA issue and the denial was for maxiumum cumulative dosing exceeded and she would not probably be able to get filled per insurance until around time of her next refill He stated that with her going back and forth on 60 and 50 mg she should have surplus Pt last received 60 mg # 30 on 10/27/2023

## 2023-11-17 ENCOUNTER — Telehealth: Payer: Self-pay | Admitting: Family Medicine

## 2023-11-17 MED ORDER — AMPHETAMINE-DEXTROAMPHETAMINE 20 MG PO TABS: 20.0000 mg | ORAL_TABLET | Freq: Every day | ORAL | 0 refills | Status: DC

## 2023-11-17 NOTE — Telephone Encounter (Signed)
Pt requesting a refill on adderall to  [place], [place]

## 2023-11-18 ENCOUNTER — Telehealth: Payer: Self-pay | Admitting: Family Medicine

## 2023-11-18 NOTE — Telephone Encounter (Signed)
Pt advised we can not change fill date on adderal that she can fill on 11/21/23

## 2023-11-18 NOTE — Telephone Encounter (Signed)
Pt called about adderall rx  rx sent for 12/30 but pt states she got filled last month on 27th  Wants fill date changed  FYI  Spoke to pharmacist at [facility],  Kathlene November  09/23/23  she got # 30 10/19/23  she received #30  ( filled early due to Thanksgiving holiday)  Per pharmacist pt should still have enough meds to last to 11/21/23 fill date and I confirmed with pharmacist they are open 11/21/23  Please advise

## 2023-12-16 ENCOUNTER — Telehealth: Payer: Self-pay

## 2023-12-16 MED ORDER — AMPHETAMINE-DEXTROAMPHETAMINE 20 MG PO TABS: 20.0000 mg | ORAL_TABLET | Freq: Every day | ORAL | 0 refills | Status: DC

## 2023-12-16 MED ORDER — LISDEXAMFETAMINE DIMESYLATE 50 MG PO CAPS: 50.0000 mg | ORAL_CAPSULE | Freq: Every day | ORAL | 0 refills | Status: DC

## 2023-12-16 NOTE — Telephone Encounter (Signed)
Requesting refill on adderall 20 and vyvanse 50. Last appt. 08/04/23. Next appt. 05/17/24.

## 2023-12-20 ENCOUNTER — Other Ambulatory Visit: Payer: Self-pay | Admitting: Family Medicine

## 2023-12-20 DIAGNOSIS — Z1231 Encounter for screening mammogram for malignant neoplasm of breast: Secondary | ICD-10-CM

## 2024-01-10 ENCOUNTER — Ambulatory Visit
Admission: RE | Admit: 2024-01-10 | Discharge: 2024-01-10 | Disposition: A | Discharge status: Home / Self Care | Payer: Medicaid Other | Source: Ambulatory Visit | Attending: Family Medicine | Admitting: Family Medicine

## 2024-01-10 DIAGNOSIS — Z1231 Encounter for screening mammogram for malignant neoplasm of breast: Secondary | ICD-10-CM

## 2024-01-16 ENCOUNTER — Other Ambulatory Visit: Payer: Self-pay | Admitting: Family Medicine

## 2024-01-16 NOTE — Telephone Encounter (Signed)
 Copied from CRM [id number]. Topic: Clinical - Medication Refill >> Jan 16, 2024  4:03 PM Antony Haste wrote: Most Recent Primary Care Visit:  Provider: Ronnald Nian  Department: [facility]  Visit Type: MYCHART VIDEO VISIT  Date: 08/04/2023  Medication: lisdexamfetamine (VYVANSE) 50 MG capsule amphetamine-dextroamphetamine (ADDERALL) 20 MG tablet  Has the patient contacted their pharmacy? Yes (Agent: If no, request that the patient contact the pharmacy for the refill. If patient does not wish to contact the pharmacy document the reason why and proceed with request.) (Agent: If yes, when and what did the pharmacy advise?)  Is this the correct pharmacy for this prescription? Yes If no, delete pharmacy and type the correct one.  This is the patient's preferred pharmacy:  [facility] - [place], [place] - [address] [place] [address] Phone: [phone] Fax: [phone]  [facility] [address] [place]), [place] - [address] [place] ([place]) [place] [address] Phone: [phone] Fax: [phone]   Has the prescription been filled recently? No  Is the patient out of the medication? Yes  Has the patient been seen for an appointment in the last year OR does the patient have an upcoming appointment? No  Can we respond through MyChart? No  Agent: Please be advised that Rx refills may take up to 3 business days. We ask that you follow-up with your pharmacy.

## 2024-01-16 NOTE — Telephone Encounter (Signed)
 Last Fill: Vyvanse: 12/16/23     Adderall: 12/16/23  Last OV: 08/04/23 Next OV: 05/17/24  Routing to provider for review/authorization.

## 2024-01-17 ENCOUNTER — Encounter: Payer: Self-pay | Admitting: Internal Medicine

## 2024-01-30 DIAGNOSIS — B3731 Acute candidiasis of vulva and vagina: Secondary | ICD-10-CM | POA: Diagnosis not present

## 2024-01-30 DIAGNOSIS — N898 Other specified noninflammatory disorders of vagina: Secondary | ICD-10-CM | POA: Diagnosis not present

## 2024-01-30 DIAGNOSIS — N760 Acute vaginitis: Secondary | ICD-10-CM | POA: Diagnosis not present

## 2024-02-08 ENCOUNTER — Other Ambulatory Visit: Payer: Self-pay | Admitting: Family Medicine

## 2024-02-08 DIAGNOSIS — Z01419 Encounter for gynecological examination (general) (routine) without abnormal findings: Secondary | ICD-10-CM | POA: Diagnosis not present

## 2024-02-08 DIAGNOSIS — Z1389 Encounter for screening for other disorder: Secondary | ICD-10-CM | POA: Diagnosis not present

## 2024-02-08 NOTE — Telephone Encounter (Signed)
 Copied from CRM [id number]. Topic: Clinical - Medication Refill >> Feb 08, 2024 10:28 AM Gildardo Pounds wrote: Most Recent Primary Care Visit:  Provider: Ronnald Nian  Department: [facility]  Visit Type: MYCHART VIDEO VISIT  Date: 08/04/2023  Medication: VYVANSE 50 MG capsule amphetamine-dextroamphetamine (ADDERALL) 20 MG tablet  Has the patient contacted their pharmacy? No (Agent: If no, request that the patient contact the pharmacy for the refill. If patient does not wish to contact the pharmacy document the reason why and proceed with request.) (Agent: If yes, when and what did the pharmacy advise?)  Is this the correct pharmacy for this prescription? Yes If no, delete pharmacy and type the correct one.  This is the patient's preferred pharmacy:  [facility] - [place], [place] - [address] [facility] [address] Mary[place] Round [place] [address] Phone: [phone] Fax: [phone]     Has the prescription been filled recently? No  Is the patient out of the medication? Yes  Has the patient been seen for an appointment in the last year OR does the patient have an upcoming appointment? Yes  Can we respond through MyChart? Yes  Agent: Please be advised that Rx refills may take up to 3 business days. We ask that you follow-up with your pharmacy.

## 2024-02-09 ENCOUNTER — Telehealth: Payer: Self-pay | Admitting: Internal Medicine

## 2024-02-09 ENCOUNTER — Encounter: Payer: Self-pay | Admitting: Family Medicine

## 2024-02-09 MED ORDER — AMPHETAMINE-DEXTROAMPHETAMINE 20 MG PO TABS: 20.0000 mg | ORAL_TABLET | Freq: Every day | ORAL | 0 refills | Status: DC

## 2024-02-09 MED ORDER — VYVANSE 50 MG PO CAPS: 50.0000 mg | ORAL_CAPSULE | Freq: Every day | ORAL | 0 refills | Status: DC

## 2024-02-09 NOTE — Telephone Encounter (Unsigned)
 Copied from CRM [id number]. Topic: Clinical - Prescription Issue >> Feb 09, 2024  1:03 PM Haroldine Laws wrote: Reason for CRM: pt called saying she was going out of town ,leaving tomorrow.  She is asking if the provider would change the start and refill date so she can pick up her prescription before she leaves to go out of town  CB#  [phone]

## 2024-02-10 NOTE — Telephone Encounter (Signed)
 See other message,  They can not refill them as she should have additional 2 days left since February only had 28 days. It is too soon to refilled for both Adderall and Vyvanse

## 2024-03-09 ENCOUNTER — Other Ambulatory Visit: Payer: Self-pay | Admitting: Family Medicine

## 2024-03-09 NOTE — Telephone Encounter (Signed)
 Copied from CRM [id number]. Topic: Clinical - Medication Refill >> Mar 09, 2024 10:20 AM Donald Frost wrote: Most Recent Primary Care Visit:  Provider: Watson Hacking  Department: [facility]  Visit Type: MYCHART VIDEO VISIT  Date: 08/04/2023  Medication: amphetamine -dextroamphetamine  (ADDERALL) 20 MG tablet, VYVANSE  50 MG capsule  Has the patient contacted their pharmacy? Yes   Is this the correct pharmacy for this prescription? Yes If no, delete pharmacy and type the correct one.  This is the patient's preferred pharmacy:  [facility] - [place], [place] - [address] [facility] [address] [place] Ares [place] [address] Phone: [phone] Fax: [phone]  [facility] [address] [place]), [place] - [address] [place] ([place]) [place] [address] Phone: [phone] Fax: [phone]   Has the prescription been filled recently? No  Is the patient out of the medication? No but she only has a few left  Has the patient been seen for an appointment in the last year OR does the patient have an upcoming appointment? Yes  Can we respond through MyChart? Yes

## 2024-03-12 ENCOUNTER — Other Ambulatory Visit: Payer: Self-pay | Admitting: Family Medicine

## 2024-03-12 ENCOUNTER — Ambulatory Visit: Payer: Self-pay

## 2024-03-12 MED ORDER — VYVANSE 50 MG PO CAPS: 50.0000 mg | ORAL_CAPSULE | Freq: Every day | ORAL | 0 refills | Status: DC

## 2024-03-12 MED ORDER — AMPHETAMINE-DEXTROAMPHETAMINE 20 MG PO TABS: 20.0000 mg | ORAL_TABLET | Freq: Every day | ORAL | 0 refills | Status: DC

## 2024-03-12 NOTE — Telephone Encounter (Signed)
 Copied from CRM [id number]. Topic: Clinical - Medication Refill >> Mar 12, 2024 11:31 AM Keitha Pata L wrote: Most Recent Primary Care Visit:  Provider: Watson Hacking  Department: [facility]  Visit Type: Altamese Jest VIDEO VISIT  Date: 08/04/2023  Medication:amphetamine -dextroamphetamine  (ADDERALL) 20 MG tablet VYVANSE  50 MG capsule   Has the patient contacted their pharmacy? No (Agent: If no, request that the patient contact the pharmacy for the refill. If patient does not wish to contact the pharmacy document the reason why and proceed with request.) (Agent: If yes, when and what did the pharmacy advise?)  Is this the correct pharmacy for this prescription? Yes If no, delete pharmacy and type the correct one.  This is the patient's preferred pharmacy:  [facility] - [place], [place] - [address] [facility] [address] [place] A[place] [address] Phone: [phone] Fax: [phone]  Has the prescription been filled recently? No  Is the patient out of the medication? Yes  Has the patient been seen for an appointment in the last year OR does the patient have an upcoming appointment? No  Can we respond through MyChart? Yes  Agent: Please be advised that Rx refills may take up to 3 business days. We ask that you follow-up with your pharmacy.

## 2024-04-03 ENCOUNTER — Telehealth: Payer: Self-pay | Admitting: Family Medicine

## 2024-04-03 NOTE — Telephone Encounter (Unsigned)
 Copied from CRM [id number]. Topic: Clinical - Medication Refill >> Apr 03, 2024 10:56 AM Tiffany B wrote: Medication: amphetamine -dextroamphetamine  (ADDERALL) 15 MG. Patient would like to decrease from 20 MG TO 15 MG because she feels like the higher strength is no longer needed. The kids are about to get out of school.   This is the patient's preferred pharmacy:  [facility] - [place], [place] - [address] [facility] [address] [place] [address] Phone: [phone] Fax: [phone]  Is this the correct pharmacy for this prescription? Yes   Has the prescription been filled recently? Yes  Is the patient out of the medication? No  Has the patient been seen for an appointment in the last year OR does the patient have an upcoming appointment? Yes  Can we respond through MyChart? No  Agent: Please be advised that Rx refills may take up to 3 business days. We ask that you follow-up with your pharmacy.

## 2024-04-05 ENCOUNTER — Telehealth: Admitting: Family Medicine

## 2024-04-05 ENCOUNTER — Other Ambulatory Visit: Payer: Self-pay

## 2024-04-05 DIAGNOSIS — F902 Attention-deficit hyperactivity disorder, combined type: Secondary | ICD-10-CM

## 2024-04-05 DIAGNOSIS — F909 Attention-deficit hyperactivity disorder, unspecified type: Secondary | ICD-10-CM

## 2024-04-05 MED ORDER — AMPHETAMINE-DEXTROAMPHETAMINE 15 MG PO TABS: 15.0000 mg | ORAL_TABLET | Freq: Every day | ORAL | 0 refills | Status: DC

## 2024-04-05 NOTE — Progress Notes (Signed)
   Subjective:    Patient ID: [name], female    DOB: 11/19/1981, 43 y.o.   MRN: [id number]  HPI Documentation for virtual audio and video telecommunications through Caregility encounter:  The patient was located at home. 2 patient identifiers used.  The provider was located in the office. The patient did consent to this visit and is aware of possible charges through their insurance for this visit.  The other persons participating in this telemedicine service were none. Time spent on call was 5 minutes and in review of previous records >15 minutes total for counseling and coordination of care.  This virtual service is not related to other E/M service within previous 7 days. She would like her Adderall dosing to be readjusted.  She states that 20 mg actually makes her hyper but stills allows her to stay focused.  Apparently it is interfering with sleep.  I will cut her back to 15 mg and continue her on that over the summer and through next school year to minimize the stimulant effect of the medicine.   Review of Systems     Objective:    Physical Exam        Assessment & Plan:  No diagnosis found.

## 2024-04-09 ENCOUNTER — Other Ambulatory Visit: Payer: Self-pay | Admitting: Family Medicine

## 2024-04-09 NOTE — Telephone Encounter (Signed)
 Copied from CRM [id number]. Topic: Clinical - Medication Refill >> Apr 09, 2024  2:42 PM Fonda T wrote: Medication:  VYVANSE  50 MG capsule   Has the patient contacted their pharmacy? No (Agent: If no, request that the patient contact the pharmacy for the refill. If patient does not wish to contact the pharmacy document the reason why and proceed with request.) (Agent: If yes, when and what did the pharmacy advise?)  This is the patient's preferred pharmacy:   [facility] - [place], [place] - [address] [facility] [address] Mos[place] Ares [place] [address] Phone: [phone] Fax: [phone]   Is this the correct pharmacy for this prescription? Yes If no, delete pharmacy and type the correct one.   Has the prescription been filled recently? Yes  Is the patient out of the medication? No (not yet out of medication, but almost)  Has the patient been seen for an appointment in the last year OR does the patient have an upcoming appointment? Yes  Can we respond through MyChart? Yes  Agent: Please be advised that Rx refills may take up to 3 business days. We ask that you follow-up with your pharmacy.

## 2024-04-10 MED ORDER — VYVANSE 50 MG PO CAPS: 50.0000 mg | ORAL_CAPSULE | Freq: Every day | ORAL | 0 refills | Status: DC

## 2024-05-01 ENCOUNTER — Other Ambulatory Visit: Payer: Self-pay | Admitting: Family Medicine

## 2024-05-01 DIAGNOSIS — F902 Attention-deficit hyperactivity disorder, combined type: Secondary | ICD-10-CM

## 2024-05-01 MED ORDER — AMPHETAMINE-DEXTROAMPHETAMINE 15 MG PO TABS: 15.0000 mg | ORAL_TABLET | Freq: Every day | ORAL | 0 refills | Status: DC

## 2024-05-01 NOTE — Telephone Encounter (Signed)
 Copied from CRM [id number]. Topic: Clinical - Medication Refill >> May 01, 2024  9:01 AM Lynnie Saucier S wrote: Medication: amphetamine -dextroamphetamine  (ADDERALL) 15 MG tablet  Has the patient contacted their pharmacy? No (Agent: If no, request that the patient contact the pharmacy for the refill. If patient does not wish to contact the pharmacy document the reason why and proceed with request.) (Agent: If yes, when and what did the pharmacy advise?)  This is the patient's preferred pharmacy:  [facility] - [place], [place] - [address] [facility] [address] Mos[place] Ares [place] [address] Phone: [phone] Fax: [phone]  Is this the correct pharmacy for this prescription? Yes If no, delete pharmacy and type the correct one.   Has the prescription been filled recently? No  Is the patient out of the medication? No  Has the patient been seen for an appointment in the last year OR does the patient have an upcoming appointment? Yes  Can we respond through MyChart? Yes  Agent: Please be advised that Rx refills may take up to 3 business days. We ask that you follow-up with your pharmacy.

## 2024-05-08 ENCOUNTER — Other Ambulatory Visit: Payer: Self-pay | Admitting: Family Medicine

## 2024-05-08 MED ORDER — VYVANSE 50 MG PO CAPS: 50.0000 mg | ORAL_CAPSULE | Freq: Every day | ORAL | 0 refills | Status: DC

## 2024-05-08 NOTE — Telephone Encounter (Signed)
 Copied from CRM [id number]. Topic: Clinical - Medication Refill >> May 08, 2024  3:32 PM Carlatta H wrote: Medication: VYVANSE  50 MG capsule  Has the patient contacted their pharmacy? No (Agent: If no, request that the patient contact the pharmacy for the refill. If patient does not wish to contact the pharmacy document the reason why and proceed with request.) (Agent: If yes, when and what did the pharmacy advise?)  This is the patient's preferred pharmacy:  [facility] - [place], [place] - [address] [facility] [address] [place] A[place] [address] Phone: [phone] Fax: [phone]   Is this the correct pharmacy for this prescription? Yes If no, delete pharmacy and type the correct one.   Has the prescription been filled recently? No  Is the patient out of the medication? Yes  Has the patient been seen for an appointment in the last year OR does the patient have an upcoming appointment? Yes  Can we respond through MyChart? Yes  Agent: Please be advised that Rx refills may take up to 3 business days. We ask that you follow-up with your pharmacy.

## 2024-05-08 NOTE — Telephone Encounter (Signed)
 Last apt 04/05/24.

## 2024-05-17 ENCOUNTER — Ambulatory Visit: Payer: Medicaid Other | Admitting: Family Medicine

## 2024-05-17 ENCOUNTER — Encounter: Payer: Self-pay | Admitting: Family Medicine

## 2024-05-17 VITALS — BP 120/84 | HR 76 | Ht 63.0 in | Wt 140.6 lb

## 2024-05-17 DIAGNOSIS — F410 Panic disorder [episodic paroxysmal anxiety] without agoraphobia: Secondary | ICD-10-CM

## 2024-05-17 DIAGNOSIS — I10 Essential (primary) hypertension: Secondary | ICD-10-CM

## 2024-05-17 DIAGNOSIS — Z8052 Family history of malignant neoplasm of bladder: Secondary | ICD-10-CM

## 2024-05-17 DIAGNOSIS — Z8049 Family history of malignant neoplasm of other genital organs: Secondary | ICD-10-CM

## 2024-05-17 DIAGNOSIS — Z803 Family history of malignant neoplasm of breast: Secondary | ICD-10-CM

## 2024-05-17 DIAGNOSIS — F341 Dysthymic disorder: Secondary | ICD-10-CM | POA: Diagnosis not present

## 2024-05-17 DIAGNOSIS — F902 Attention-deficit hyperactivity disorder, combined type: Secondary | ICD-10-CM | POA: Diagnosis not present

## 2024-05-17 DIAGNOSIS — Z8673 Personal history of transient ischemic attack (TIA), and cerebral infarction without residual deficits: Secondary | ICD-10-CM | POA: Diagnosis not present

## 2024-05-17 DIAGNOSIS — Z13220 Encounter for screening for lipoid disorders: Secondary | ICD-10-CM | POA: Diagnosis not present

## 2024-05-17 DIAGNOSIS — Z0000 Encounter for general adult medical examination without abnormal findings: Secondary | ICD-10-CM

## 2024-05-17 LAB — COMPREHENSIVE METABOLIC PANEL WITH GFR
ALT: 30 IU/L (ref 0–32)
AST: 28 IU/L (ref 0–40)
Albumin: 4.6 g/dL (ref 3.9–4.9)
Alkaline Phosphatase: 71 IU/L (ref 44–121)
BUN/Creatinine Ratio: 19 (ref 9–23)
BUN: 13 mg/dL (ref 6–24)
Bilirubin Total: 0.6 mg/dL (ref 0.0–1.2)
CO2: 20 mmol/L (ref 20–29)
Calcium: 9.6 mg/dL (ref 8.7–10.2)
Chloride: 103 mmol/L (ref 96–106)
Creatinine, Ser: 0.67 mg/dL (ref 0.57–1.00)
Globulin, Total: 2.3 g/dL (ref 1.5–4.5)
Glucose: 81 mg/dL (ref 70–99)
Potassium: 4 mmol/L (ref 3.5–5.2)
Sodium: 139 mmol/L (ref 134–144)
Total Protein: 6.9 g/dL (ref 6.0–8.5)
eGFR: 113 mL/min/{1.73_m2} (ref >59)

## 2024-05-17 LAB — CBC WITH DIFFERENTIAL/PLATELET
Basophils Absolute: 0 10*3/uL (ref 0.0–0.2)
Basos: 1 %
EOS (ABSOLUTE): 0.1 10*3/uL (ref 0.0–0.4)
Eos: 2 %
Hematocrit: 43.2 % (ref 34.0–46.6)
Hemoglobin: 14.4 g/dL (ref 11.1–15.9)
Immature Grans (Abs): 0 10*3/uL (ref 0.0–0.1)
Immature Granulocytes: 0 %
Lymphocytes Absolute: 2 10*3/uL (ref 0.7–3.1)
Lymphs: 32 %
MCH: 30.9 pg (ref 26.6–33.0)
MCHC: 33.3 g/dL (ref 31.5–35.7)
MCV: 93 fL (ref 79–97)
Monocytes Absolute: 0.6 10*3/uL (ref 0.1–0.9)
Monocytes: 9 %
Neutrophils Absolute: 3.6 10*3/uL (ref 1.4–7.0)
Neutrophils: 56 %
Platelets: 317 10*3/uL (ref 150–450)
RBC: 4.66 x10E6/uL (ref 3.77–5.28)
RDW: 12.9 % (ref 11.7–15.4)
WBC: 6.3 10*3/uL (ref 3.4–10.8)

## 2024-05-17 LAB — LIPID PANEL
Chol/HDL Ratio: 2.9 ratio (ref 0.0–4.4)
Cholesterol, Total: 190 mg/dL (ref 100–199)
HDL: 66 mg/dL (ref >39)
LDL Chol Calc (NIH): 113 mg/dL — ABNORMAL HIGH (ref 0–99)
Triglycerides: 56 mg/dL (ref 0–149)
VLDL Cholesterol Cal: 11 mg/dL (ref 5–40)

## 2024-05-17 MED ORDER — SERTRALINE HCL 50 MG PO TABS: 50.0000 mg | ORAL_TABLET | Freq: Every day | ORAL | 3 refills | Status: AC

## 2024-05-17 MED ORDER — LOSARTAN POTASSIUM-HCTZ 50-12.5 MG PO TABS: 1.0000 | ORAL_TABLET | Freq: Every day | ORAL | 3 refills | Status: AC

## 2024-05-17 NOTE — Progress Notes (Signed)
 Complete physical exam  Patient: [name]   DOB: 03-22-1981   43 y.o. Female  MRN: [id number]  Subjective:    Chief Complaint  Patient presents with   Annual Exam    Cpe. Fasting. No other concerns.     [name] is a 42 y.o. female who presents today for a complete physical exam.  She reports consuming a general diet. The patient does not participate in regular exercise at present. She generally feels well. She reports sleeping well.  She continues on her regular dosing of Vyvanse  and Adderall.  She is also on losartan /HCTZ and having no difficulty with that.  Psychologically she is very comfortable sticking with her present dosing of sertraline .  Her marriage and home life are stable.  She does not smoke or drink.  She follows up regularly with gynecology and has also had a mammogram.  Most recent fall risk assessment:    05/17/2024    8:14 AM  Fall Risk   Falls in the past year? 0  Number falls in past yr: 0  Injury with Fall? 0  Risk for fall due to : No Fall Risks  Follow up Falls evaluation completed     Most recent depression screenings:    05/17/2024    8:14 AM 05/17/2023    8:24 AM  PHQ 2/9 Scores  PHQ - 2 Score 0 0  PHQ- 9 Score  0    Vision:Not within last year  and Dental: No current dental problems and Receives regular dental care    Immunization History  Administered Date(s) Administered   Influenza Split 11/26/2011, 10/09/2012   Influenza,inj,Quad PF,6+ Mos 08/11/2020, 10/19/2022   Influenza-Unspecified 07/19/2021, 09/15/2023   Tdap 02/23/2016    Health Maintenance  Topic Date Due   Hepatitis B Vaccines (1 of 3 - 19+ 3-dose series) Never done   HPV VACCINES (1 - 3-dose SCDM series) Never done   INFLUENZA VACCINE  06/22/2024   Cervical Cancer Screening (HPV/Pap Cotest)  11/19/2025   DTaP/Tdap/Td (2 - Td or Tdap) 02/22/2026   Hepatitis C Screening  Completed   HIV Screening  Completed   Meningococcal B Vaccine  Aged Out   COVID-19  Vaccine  Discontinued    Patient Care Team: Joyce Norleen BROCKS, MD as PCP - General (Family Medicine)   Outpatient Medications Prior to Visit  Medication Sig   amphetamine -dextroamphetamine  (ADDERALL) 15 MG tablet Take 1 tablet by mouth daily.   aspirin  EC 81 MG tablet Take 1 tablet (81 mg total) by mouth daily.   Ergocalciferol  (VITAMIN D2 PO) Vitamin D2   nystatin-triamcinolone (MYCOLOG II) cream    VYVANSE  50 MG capsule Take 1 capsule (50 mg total) by mouth daily.   [DISCONTINUED] losartan -hydrochlorothiazide  (HYZAAR) 50-12.5 MG tablet Take 1 tablet by mouth daily.   [DISCONTINUED] sertraline  (ZOLOFT ) 50 MG tablet Take 1 tablet (50 mg total) by mouth daily.   No facility-administered medications prior to visit.    Review of Systems  All other systems reviewed and are negative.   Family and social history as well as health maintenance and immunizations was reviewed.     Objective:    BP 120/84   Pulse 76   Ht 5' 3 (1.6 m)   Wt 140 lb 9.6 oz (63.8 kg)   SpO2 94%   BMI 24.91 kg/m    Physical Exam  Alert and in no distress. Tympanic membranes and canals are normal. Pharyngeal area is normal. Neck is supple without  adenopathy or thyromegaly. Cardiac exam shows a regular sinus rhythm without murmurs or gallops. Lungs are clear to auscultation.      Assessment & Plan:    Routine general medical examination at a health care facility  Panic disorder - Plan: sertraline  (ZOLOFT ) 50 MG tablet  History of CVA (cerebrovascular accident) - Plan: CBC with Differential/Platelet, Comprehensive metabolic panel with GFR  Family history of cervical cancer  Family history of breast cancer  Family history of bladder cancer  Essential hypertension - Plan: CBC with Differential/Platelet, Comprehensive metabolic panel with GFR, losartan -hydrochlorothiazide  (HYZAAR) 50-12.5 MG tablet  Dysthymia  Attention deficit hyperactivity disorder (ADHD), combined type  Screening for lipid  disorders - Plan: Lipid panel  Life is going quite well for her right now.  Her children are growing and starting to move out of the house.  She seems to have a good attitude towards that. Return in about 1 year (around 05/17/2025).      Norleen Jobs, MD

## 2024-05-18 ENCOUNTER — Ambulatory Visit: Payer: Self-pay | Admitting: Family Medicine

## 2024-06-01 ENCOUNTER — Other Ambulatory Visit: Payer: Self-pay | Admitting: Family Medicine

## 2024-06-01 DIAGNOSIS — F902 Attention-deficit hyperactivity disorder, combined type: Secondary | ICD-10-CM

## 2024-06-01 MED ORDER — AMPHETAMINE-DEXTROAMPHETAMINE 15 MG PO TABS
15.0000 mg | ORAL_TABLET | Freq: Every day | ORAL | 0 refills | Status: DC
Start: 2024-06-01 — End: 2024-06-26

## 2024-06-01 NOTE — Telephone Encounter (Signed)
 Copied from CRM [id number]. Topic: Clinical - Medication Refill >> Jun 01, 2024 10:33 AM Graeme ORN wrote: Medication: amphetamine -dextroamphetamine  (ADDERALL) 15 MG table  Has the patient contacted their pharmacy? No (Agent: If no, request that the patient contact the pharmacy for the refill. If patient does not wish to contact the pharmacy document the reason why and proceed with request.) (Agent: If yes, when and what did the pharmacy advise?)  This is the patient's preferred pharmacy:  [facility] - [place], [place] - [address] [facility] [address] [place] [address] Phone: [phone] Fax: [phone]   Is this the correct pharmacy for this prescription? Yes If no, delete pharmacy and type the correct one.   Has the prescription been filled recently? Yes  Is the patient out of the medication? Yes  Has the patient been seen for an appointment in the last year OR does the patient have an upcoming appointment? Yes  Can we respond through MyChart? Yes  Agent: Please be advised that Rx refills may take up to 3 business days. We ask that you follow-up with your pharmacy.

## 2024-06-11 ENCOUNTER — Other Ambulatory Visit: Payer: Self-pay | Admitting: Family Medicine

## 2024-06-11 ENCOUNTER — Telehealth: Payer: Self-pay

## 2024-06-11 MED ORDER — VYVANSE 50 MG PO CAPS: 50.0000 mg | ORAL_CAPSULE | Freq: Every day | ORAL | 0 refills | Status: DC

## 2024-06-11 NOTE — Telephone Encounter (Signed)
 Copied from CRM [id number]. Topic: Clinical - Medication Refill >> Jun 11, 2024  9:29 AM Gustabo D wrote: Medication: VYVANSE  50 MG capsule  Has the patient contacted their pharmacy? No (Agent: If no, request that the patient contact the pharmacy for the refill. If patient does not wish to contact the pharmacy document the reason why and proceed with request.) (Agent: If yes, when and what did the pharmacy advise?)  This is the patient's preferred pharmacy:  [facility] - [place], [place] - [address] [facility] [address] [name] [address] Phone: [phone] Fax: [phone]   Is this the correct pharmacy for this prescription? Yes If no, delete pharmacy and type the correct one.   Has the prescription been filled recently? No  Is the patient out of the medication? Yes  Has the patient been seen for an appointment in the last year OR does the patient have an upcoming appointment? Yes  Can we respond through MyChart? Yes  Agent: Please be advised that Rx refills may take up to 3 business days. We ask that you follow-up with your pharmacy.

## 2024-06-11 NOTE — Telephone Encounter (Signed)
 FYI Only or Action Required?: Action required by provider: medication refill request.  Patient was last seen in primary care on 05/17/2024 by Joyce Norleen BROCKS, MD.  Called Nurse Triage reporting No chief complaint on file..  Symptoms began today.  Interventions attempted: Nothing.  Symptoms are: stable.  Triage Disposition: No disposition on file.  Patient/caregiver understands and will follow disposition?:

## 2024-06-11 NOTE — Telephone Encounter (Signed)
 Copied from CRM [id number]. Topic: Clinical - Prescription Issue >> Jun 11, 2024  1:16 PM Rachelle R wrote: Reason for CRM: Patient called this morning for a refill of VYVANSE  50 MG capsule. Refill was sent but not available to fill until 06/14/24. Patient is asking if the refill date can be today as she is out of the medication.  Patient can be reached at [phone]

## 2024-06-12 NOTE — Telephone Encounter (Unsigned)
 Copied from CRM [id number]. Topic: Clinical - Prescription Issue >> Jun 12, 2024 12:23 PM Carlatta H wrote: Reason for CRM: Please call patient to advise about VYVANSE  50 MG capsule [[id number]] prescription and being filled early//

## 2024-06-26 ENCOUNTER — Other Ambulatory Visit: Payer: Self-pay | Admitting: Family Medicine

## 2024-06-26 DIAGNOSIS — F902 Attention-deficit hyperactivity disorder, combined type: Secondary | ICD-10-CM

## 2024-06-26 MED ORDER — AMPHETAMINE-DEXTROAMPHETAMINE 15 MG PO TABS: 15.0000 mg | ORAL_TABLET | Freq: Every day | ORAL | 0 refills | Status: DC

## 2024-06-26 NOTE — Telephone Encounter (Signed)
 Is this okay to refill?

## 2024-06-26 NOTE — Telephone Encounter (Signed)
 Copied from CRM #[id number]. Topic: Clinical - Medication Refill >> Jun 26, 2024  9:54 AM Marissa P wrote: Medication: amphetamine -dextroamphetamine  (ADDERALL) 15 MG tablet  Has the patient contacted their pharmacy? No (Agent: If no, request that the patient contact the pharmacy for the refill. If patient does not wish to contact the pharmacy document the reason why and proceed with request.) (Agent: If yes, when and what did the pharmacy advise?)  This is the patient's preferred pharmacy:  [facility] - [place], [place] - [address] [facility] [address] [place] [address] Phone: [phone] Fax: [phone]  Is this the correct pharmacy for this prescription? Yes If no, delete pharmacy and type the correct one.   Has the prescription been filled recently? Yes  Is the patient out of the medication? Yes  Has the patient been seen for an appointment in the last year OR does the patient have an upcoming appointment? Yes  Can we respond through MyChart? Yes  Agent: Please be advised that Rx refills may take up to 3 business days. We ask that you follow-up with your pharmacy.

## 2024-07-09 ENCOUNTER — Other Ambulatory Visit: Payer: Self-pay | Admitting: Family Medicine

## 2024-07-09 MED ORDER — VYVANSE 50 MG PO CAPS: 50.0000 mg | ORAL_CAPSULE | Freq: Every day | ORAL | 0 refills | Status: DC

## 2024-07-09 NOTE — Telephone Encounter (Signed)
 Copied from CRM #[id number]. Topic: Clinical - Medication Refill >> Jul 09, 2024 10:57 AM Graeme ORN wrote: Medication:  VYVANSE  50 MG capsule   Has the patient contacted their pharmacy? No (Agent: If no, request that the patient contact the pharmacy for the refill. If patient does not wish to contact the pharmacy document the reason why and proceed with request.) (Agent: If yes, when and what did the pharmacy advise?)  This is the patient's preferred pharmacy:  [facility] - [place], [place] - [address] [facility] [address] [place] [address] Phone: [phone] Fax: [phone]  Is this the correct pharmacy for this prescription? Yes If no, delete pharmacy and type the correct one.   Has the prescription been filled recently? Yes  Is the patient out of the medication? No  Has the patient been seen for an appointment in the last year OR does the patient have an upcoming appointment? Yes  Can we respond through MyChart? Yes  Agent: Please be advised that Rx refills may take up to 3 business days. We ask that you follow-up with your pharmacy.

## 2024-07-12 ENCOUNTER — Telehealth: Payer: Self-pay

## 2024-07-12 ENCOUNTER — Other Ambulatory Visit: Payer: Self-pay

## 2024-07-12 NOTE — Telephone Encounter (Unsigned)
 Copied from CRM #[id number]. Topic: Clinical - Prescription Issue >> Jul 11, 2024  4:21 PM [name] wrote: Reason for CRM: Patient calling in and stated that her pharmacy is closed on the 0824/2025 which is the refill renew date for VYVANSE  50 MG capsule. She would like to see if it can be refilled on 07/14/2024. Please advise. Call back #[phone]

## 2024-07-12 NOTE — Telephone Encounter (Signed)
 I called to let her know to pick up day after.

## 2024-07-12 NOTE — Telephone Encounter (Signed)
 Copied from CRM #[id number]. Topic: Clinical - Prescription Issue >> Jul 11, 2024  4:21 PM Kevelyn M wrote: Reason for CRM: Patient calling in and stated that her pharmacy is closed on the 0824/2025 which is the refill renew date for VYVANSE  50 MG capsule. She would like to see if it can be refilled on 07/14/2024. Please advise. Call back #[phone]

## 2024-07-13 ENCOUNTER — Telehealth: Payer: Self-pay

## 2024-07-13 MED ORDER — VYVANSE 50 MG PO CAPS: 50.0000 mg | ORAL_CAPSULE | Freq: Every day | ORAL | 0 refills | Status: DC

## 2024-07-13 NOTE — Addendum Note (Signed)
 Addended by: JOYCE NORLEEN BROCKS on: 07/13/2024 11:56 AM   Modules accepted: Orders

## 2024-07-13 NOTE — Telephone Encounter (Signed)
 Copied from CRM #[id number]. Topic: Clinical - Prescription Issue >> Jul 11, 2024  4:21 PM Kevelyn M wrote: Reason for CRM: Patient calling in and stated that her pharmacy is closed on the 0824/2025 which is the refill renew date for VYVANSE  50 MG capsule. She would like to see if it can be refilled on 07/14/2024. Please advise. Call back #[phone]

## 2024-07-16 ENCOUNTER — Other Ambulatory Visit (HOSPITAL_COMMUNITY): Payer: Self-pay

## 2024-07-24 ENCOUNTER — Telehealth: Payer: Self-pay

## 2024-07-24 MED ORDER — LISDEXAMFETAMINE DIMESYLATE 60 MG PO CAPS: 60.0000 mg | ORAL_CAPSULE | ORAL | 0 refills | Status: DC

## 2024-07-24 NOTE — Telephone Encounter (Signed)
 Copied from CRM #[id number]. Topic: Clinical - Medication Question >> Jul 24, 2024 11:30 AM Graeme ORN wrote: Reason for CRM: Patient called request increase VYVANSE  50 MG capsule to 60 MG - kids back in school - needs to focus better and pay attention more.

## 2024-07-24 NOTE — Telephone Encounter (Signed)
 She states that she would like to go to a higher level especially since her kids are back in school and she needs to stay focused.  She is to send me a message on MyChart in a month to let me know how she is doing

## 2024-07-24 NOTE — Addendum Note (Signed)
 Addended by: JOYCE NORLEEN BROCKS on: 07/24/2024 01:18 PM   Modules accepted: Orders

## 2024-07-26 ENCOUNTER — Other Ambulatory Visit (HOSPITAL_COMMUNITY): Payer: Self-pay

## 2024-07-30 ENCOUNTER — Other Ambulatory Visit (HOSPITAL_COMMUNITY): Payer: Self-pay

## 2024-07-30 ENCOUNTER — Telehealth: Payer: Self-pay | Admitting: Pharmacy Technician

## 2024-07-30 NOTE — Telephone Encounter (Signed)
 Pharmacy Patient Advocate Encounter   Received notification from Onbase that prior authorization for Lisdesamfetamine Dimesylate 60mg  capsules is required/requested.   Insurance verification completed.   The patient is insured through [employer] .   Per test claim:  Brand name Vyvanse  60mg  capsules is preferred by the insurance.  If suggested medication is appropriate, Please send in a new RX and discontinue this one. If not, please advise as to why it's not appropriate so that we may request a Prior Authorization. Please note, some preferred medications may still require a PA.  If the suggested medications have not been trialed and there are no contraindications to their use, the PA will not be submitted, as it will not be approved.  New prescription is not required. Medications are interchangeable at the pharmacy. Patient's pharmacy dispensed name Brand on 07/24/2024. Next refill is due on or after 08/16/2024.

## 2024-07-31 ENCOUNTER — Other Ambulatory Visit: Payer: Self-pay | Admitting: Family Medicine

## 2024-07-31 DIAGNOSIS — F902 Attention-deficit hyperactivity disorder, combined type: Secondary | ICD-10-CM

## 2024-07-31 NOTE — Telephone Encounter (Unsigned)
 Copied from CRM [id number]. Topic: Clinical - Medication Refill >> Jul 31, 2024  3:06 PM Fredrica W wrote: Medication: amphetamine -dextroamphetamine  (ADDERALL) 15 MG tablet  Has the patient contacted their pharmacy? No (Agent: If no, request that the patient contact the pharmacy for the refill. If patient does not wish to contact the pharmacy document the reason why and proceed with request.) (Agent: If yes, when and what did the pharmacy advise?)  This is the patient's preferred pharmacy:  [facility] - [place], [place] - [address] [place] [address] Phone: [phone] Fax: [phone]   Is this the correct pharmacy for this prescription? Yes If no, delete pharmacy and type the correct one.   Has the prescription been filled recently? Yes  Is the patient out of the medication? No  Has the patient been seen for an appointment in the last year OR does the patient have an upcoming appointment? Yes  Can we respond through MyChart? Yes  Agent: Please be advised that Rx refills may take up to 3 business days. We ask that you follow-up with your pharmacy.

## 2024-07-31 NOTE — Telephone Encounter (Signed)
 Is this okay to refill?

## 2024-08-01 MED ORDER — AMPHETAMINE-DEXTROAMPHETAMINE 15 MG PO TABS: 15.0000 mg | ORAL_TABLET | Freq: Every day | ORAL | 0 refills | Status: DC

## 2024-08-09 ENCOUNTER — Telehealth: Payer: Self-pay

## 2024-08-09 MED ORDER — AMPHETAMINE-DEXTROAMPHETAMINE 20 MG PO TABS: 20.0000 mg | ORAL_TABLET | Freq: Every day | ORAL | 0 refills | Status: DC

## 2024-08-09 NOTE — Telephone Encounter (Signed)
 She called asking for higher dose of Adderall to get better focus later on in the day.

## 2024-08-09 NOTE — Telephone Encounter (Signed)
 Copied from CRM [id number]. Topic: Clinical - Medication Question >> Aug 09, 2024 10:21 AM Charlet HERO wrote: Reason for CRM: Patient is calling to see if she can get her amphetamine -dextroamphetamine  (ADDERALL) 15 MG tablet  Moved up to 20mg 

## 2024-08-16 ENCOUNTER — Other Ambulatory Visit: Payer: Self-pay | Admitting: Family Medicine

## 2024-08-16 MED ORDER — LISDEXAMFETAMINE DIMESYLATE 60 MG PO CAPS: 60.0000 mg | ORAL_CAPSULE | ORAL | 0 refills | Status: DC

## 2024-08-16 NOTE — Telephone Encounter (Signed)
 Med refill

## 2024-08-16 NOTE — Telephone Encounter (Signed)
 Copied from CRM [id number]. Topic: Clinical - Medication Refill >> Aug 16, 2024 11:03 AM Willma R wrote: Medication: lisdexamfetamine (VYVANSE ) 60 MG capsule  Has the patient contacted their pharmacy? Yes, call dr  This is the patient's preferred pharmacy: [facility] [address] [place]), [place] - [address] [place]) [place] [address] Phone: [phone] Fax: [phone]  Is this the correct pharmacy for this prescription? Yes If no, delete pharmacy and type the correct one.   Has the prescription been filled recently? No  Is the patient out of the medication? No  Has the patient been seen for an appointment in the last year OR does the patient have an upcoming appointment? Yes  Can we respond through MyChart? Yes  Agent: Please be advised that Rx refills may take up to 3 business days. We ask that you follow-up with your pharmacy.

## 2024-09-03 ENCOUNTER — Other Ambulatory Visit: Payer: Self-pay | Admitting: Family Medicine

## 2024-09-03 MED ORDER — AMPHETAMINE-DEXTROAMPHETAMINE 20 MG PO TABS: 20.0000 mg | ORAL_TABLET | Freq: Every day | ORAL | 0 refills | Status: DC

## 2024-09-03 NOTE — Telephone Encounter (Signed)
 Copied from CRM [id number]. Topic: Clinical - Medication Refill >> Sep 03, 2024 10:31 AM Ahlexyia S wrote: Medication: amphetamine -dextroamphetamine  (ADDERALL) 20 MG tablet  Has the patient contacted their pharmacy? No (Agent: If no, request that the patient contact the pharmacy for the refill. If patient does not wish to contact the pharmacy document the reason why and proceed with request.) (Agent: If yes, when and what did the pharmacy advise?)  This is the patient's preferred pharmacy:  [facility] [address] ([address]), [place] - [address] [place]) [place] [address] Phone: [phone] Fax: [phone]  Is this the correct pharmacy for this prescription? Yes If no, delete pharmacy and type the correct one.   Has the prescription been filled recently? Yes, last month was the last fill date.  Is the patient out of the medication? Yes  Has the patient been seen for an appointment in the last year OR does the patient have an upcoming appointment? Yes  Can we respond through MyChart? Yes  Agent: Please be advised that Rx refills may take up to 3 business days. We ask that you follow-up with your pharmacy.

## 2024-09-06 ENCOUNTER — Telehealth: Payer: Self-pay | Admitting: Family Medicine

## 2024-09-06 ENCOUNTER — Telehealth: Payer: Self-pay

## 2024-09-06 MED ORDER — AMPHETAMINE-DEXTROAMPHETAMINE 20 MG PO TABS: 20.0000 mg | ORAL_TABLET | Freq: Every day | ORAL | 0 refills | Status: DC

## 2024-09-06 NOTE — Telephone Encounter (Unsigned)
 Copied from CRM [id number]. Topic: Clinical - Prescription Issue >> Sep 06, 2024  4:39 PM Ashley R wrote: Tried calling CAL - Copied from CRM [id number]. Topic: Clinical - Prescription Issue Reason for CRM: Patient sent a MyChart Message to get her prescription for amphetamine -dextroamphetamine  (ADDERALL) 20 MG tablet  today. Dr Joyce said okay to get early but the prescription date was not changed.   Patient can reached at [phone]

## 2024-09-06 NOTE — Addendum Note (Signed)
 Addended by: JOYCE NORLEEN BROCKS on: 09/06/2024 03:51 PM   Modules accepted: Orders

## 2024-09-06 NOTE — Telephone Encounter (Signed)
 Copied from CRM [id number]. Topic: Clinical - Medication Question >> Sep 06, 2024  8:33 AM Gustabo D wrote: amphetamine -dextroamphetamine  (ADDERALL) 20 MG tablet (Starting on 09/08/2024)- pt is going out of town and wants to know if he med can be ready on the 17th

## 2024-09-06 NOTE — Telephone Encounter (Signed)
 Copied from CRM #[id number]. Topic: Clinical - Prescription Issue >> Sep 06, 2024  2:17 PM [name] wrote: Reason for CRM: Patient sent a MyChart Message to get her prescription for amphetamine -dextroamphetamine  (ADDERALL) 20 MG tablet  today. Dr Joyce said okay to get early but the prescription date was not changed.  Patient can reached at [phone]

## 2024-09-07 ENCOUNTER — Telehealth: Payer: Self-pay

## 2024-09-07 ENCOUNTER — Other Ambulatory Visit: Payer: Self-pay | Admitting: Medical

## 2024-09-07 MED ORDER — AMPHETAMINE-DEXTROAMPHETAMINE 20 MG PO TABS: 20.0000 mg | ORAL_TABLET | Freq: Every day | ORAL | 0 refills | Status: DC

## 2024-09-07 NOTE — Telephone Encounter (Signed)
 Copied from CRM [id number]. Topic: Clinical - Prescription Issue >> Sep 06, 2024  4:39 PM Ashley R wrote: Tried calling CAL - Copied from CRM [id number]. Topic: Clinical - Prescription Issue Reason for CRM: Patient sent a MyChart Message to get her prescription for amphetamine -dextroamphetamine  (ADDERALL) 20 MG tablet  today. Dr Joyce said okay to get early but the prescription date was not changed.   Patient can reached at [phone] >> Sep 07, 2024  8:41 AM Roselie BROCKS wrote: Patient calling once more concerning the Adderal 20 mg, the pharmacy states they still need the 18th removed off the medication so it can be filled today, The provider has approved the early fill,but left the date on the medication.   Spoke with Patient and advised her that I spoke with Pharmacy yesterday and they said they would fill prescription today 09-07-24

## 2024-09-17 ENCOUNTER — Other Ambulatory Visit: Payer: Self-pay | Admitting: Family Medicine

## 2024-09-17 NOTE — Telephone Encounter (Unsigned)
 Copied from CRM #[id number]. Topic: Clinical - Medication Refill >> Sep 17, 2024 11:31 AM Amy B wrote: Medication:  lisdexamfetamine (VYVANSE ) 60 MG capsule   Has the patient contacted their pharmacy? Yes (Agent: If no, request that the patient contact the pharmacy for the refill. If patient does not wish to contact the pharmacy document the reason why and proceed with request.) (Agent: If yes, when and what did the pharmacy advise?)  This is the patient's preferred pharmacy:  [facility] - [place], [place] - [address] [facility] [address] [place] [address] Phone: [phone] Fax: [phone]  Is this the correct pharmacy for this prescription? Yes If no, delete pharmacy and type the correct one.   Has the prescription been filled recently? No  Is the patient out of the medication? Yes  Has the patient been seen for an appointment in the last year OR does the patient have an upcoming appointment? Yes  Can we respond through MyChart? Yes  Agent: Please be advised that Rx refills may take up to 3 business days. We ask that you follow-up with your pharmacy.

## 2024-09-18 MED ORDER — LISDEXAMFETAMINE DIMESYLATE 60 MG PO CAPS: 60.0000 mg | ORAL_CAPSULE | ORAL | 0 refills | Status: DC

## 2024-09-19 ENCOUNTER — Telehealth: Payer: Self-pay

## 2024-09-19 MED ORDER — LISDEXAMFETAMINE DIMESYLATE 60 MG PO CAPS: 60.0000 mg | ORAL_CAPSULE | ORAL | 0 refills | Status: DC

## 2024-09-19 NOTE — Addendum Note (Signed)
 Addended by: JOYCE NORLEEN BROCKS on: 09/19/2024 11:26 AM   Modules accepted: Orders

## 2024-09-19 NOTE — Telephone Encounter (Signed)
 Copied from CRM [id number]. Topic: Clinical - Prescription Issue >> Sep 19, 2024  9:59 AM Travis F wrote: Reason for CRM: Patient is calling in because her prescription is set to be filled on Sunday but her pharmacy is closed on Sunday and she will be out of town. Patient wants to know if she can have it filled on Friday. Please follow up with patient. lisdexamfetamine (VYVANSE ) 60 MG capsule [[id number]]

## 2024-09-20 ENCOUNTER — Telehealth: Payer: Self-pay

## 2024-09-20 ENCOUNTER — Other Ambulatory Visit: Payer: Self-pay | Admitting: Family Medicine

## 2024-09-20 NOTE — Telephone Encounter (Signed)
 Copied from CRM [id number]. Topic: Clinical - Prescription Issue >> Sep 19, 2024  9:59 AM Travis F wrote: Reason for CRM: Patient is calling in because her prescription is set to be filled on Sunday but her pharmacy is closed on Sunday and she will be out of town. Patient wants to know if she can have it filled on Friday. Please follow up with patient. lisdexamfetamine (VYVANSE ) 60 MG capsule [[id number]] >> Sep 20, 2024  9:17 AM Amy B wrote: Patient called again stating she needs her medication filled by tomorrow as she will be out of town this weekend.  Please advise.   Dr. Joyce, I Called Patient back to let her know you called Vyvanse  into [facility], She stated she will be out of town and wants you to say she can pick up on Friday.

## 2024-09-20 NOTE — Telephone Encounter (Signed)
 Copied from CRM [id number]. Topic: Clinical - Prescription Issue >> Sep 20, 2024  1:46 PM Roselie BROCKS wrote: Reason for CRM: Patient has a prescription for  lisdexamfetamine (VYVANSE ) 60 MG capsule (Starting on 09/23/2024)  But she is going out of town and needs to know if can be changed to start Tomorrow on Oct 31st, And needs to go to : [facility] - [place],  - [address] [facility] [address] [address] [place] [address] Phone: [phone] Fax: [phone]   Please return call to patient to confirm

## 2024-09-20 NOTE — Telephone Encounter (Signed)
 Called Pt and let her know Dr.Lalonde said she could pick up Vyvanse  from [facility] on Sunday.

## 2024-09-20 NOTE — Telephone Encounter (Signed)
 Copied from CRM [id number]. Topic: Clinical - Prescription Issue >> Sep 19, 2024  9:59 AM Travis F wrote: Reason for CRM: Patient is calling in because her prescription is set to be filled on Sunday but her pharmacy is closed on Sunday and she will be out of town. Patient wants to know if she can have it filled on Friday. Please follow up with patient. lisdexamfetamine (VYVANSE ) 60 MG capsule [[id number]] >> Sep 20, 2024  9:17 AM Amy B wrote: Patient called again stating she needs her medication filled by tomorrow as she will be out of town this weekend.  Please advise.

## 2024-10-04 ENCOUNTER — Other Ambulatory Visit: Payer: Self-pay | Admitting: Family Medicine

## 2024-10-04 MED ORDER — AMPHETAMINE-DEXTROAMPHETAMINE 20 MG PO TABS
20.0000 mg | ORAL_TABLET | Freq: Every day | ORAL | 0 refills | Status: AC
Start: 2024-10-08 — End: ?

## 2024-10-04 NOTE — Telephone Encounter (Signed)
 Copied from CRM [id number]. Topic: Clinical - Medication Refill >> Oct 04, 2024  9:40 AM Donee H wrote: Medication: amphetamine -dextroamphetamine  (ADDERALL) 20 MG tablet  Has the patient contacted their pharmacy? No   This is the patient's preferred pharmacy:  [facility] - [place], [place] - [address] [place] [address] Phone: [phone] Fax: [phone]    Is this the correct pharmacy for this prescription? Yes  Has the prescription been filled recently? No  Is the patient out of the medication? Yes  Has the patient been seen for an appointment in the last year OR does the patient have an upcoming appointment? Yes  Can we respond through MyChart? Yes  Agent: Please be advised that Rx refills may take up to 3 business days. We ask that you follow-up with your pharmacy.

## 2024-10-16 ENCOUNTER — Other Ambulatory Visit: Payer: Self-pay | Admitting: Family Medicine

## 2024-10-16 MED ORDER — LISDEXAMFETAMINE DIMESYLATE 60 MG PO CAPS: 60.0000 mg | ORAL_CAPSULE | ORAL | 0 refills | Status: DC

## 2024-10-16 NOTE — Telephone Encounter (Signed)
 Copied from CRM [id number]. Topic: Clinical - Medication Refill >> Oct 16, 2024 12:03 PM Amy B wrote: Medication:  lisdexamfetamine (VYVANSE ) 60 MG capsule   Has the patient contacted their pharmacy? No (Agent: If no, request that the patient contact the pharmacy for the refill. If patient does not wish to contact the pharmacy document the reason why and proceed with request.) (Agent: If yes, when and what did the pharmacy advise?)  This is the patient's preferred pharmacy:  [facility] - [place], [place] - [address] [facility] [address] [place] [address] Phone: [phone] Fax: [phone]  Is this the correct pharmacy for this prescription? Yes If no, delete pharmacy and type the correct one.   Has the prescription been filled recently? No  Is the patient out of the medication? Yes  Has the patient been seen for an appointment in the last year OR does the patient have an upcoming appointment? Yes  Can we respond through MyChart? Yes  Agent: Please be advised that Rx refills may take up to 3 business days. We ask that you follow-up with your pharmacy.

## 2024-10-17 ENCOUNTER — Telehealth: Payer: Self-pay

## 2024-10-17 ENCOUNTER — Telehealth (INDEPENDENT_AMBULATORY_CARE_PROVIDER_SITE_OTHER): Admitting: Family Medicine

## 2024-10-17 DIAGNOSIS — F902 Attention-deficit hyperactivity disorder, combined type: Secondary | ICD-10-CM | POA: Diagnosis not present

## 2024-10-17 MED ORDER — AMPHETAMINE-DEXTROAMPHET ER 30 MG PO CP24: 30.0000 mg | ORAL_CAPSULE | ORAL | 0 refills | Status: DC

## 2024-10-17 NOTE — Progress Notes (Signed)
   Subjective:    Patient ID: [name], female    DOB: [birth year], 43 y.o.   MRN: [id number]  HPI Documentation for virtual audio and video telecommunications through Caregility encounter:  The patient was located at home. 2 patient identifiers used.  The provider was located in the office. The patient did consent to this visit and is aware of possible charges through their insurance for this visit.  The other persons participating in this telemedicine service were none. Time spent on call was 5 minutes and in review of previous records >20  minutes total for counseling and coordination of care.  This virtual service is not related to other E/M service within previous 7 days.  She presently is taking Vyvanse  60 mg in the morning around 630 and this lasts until roughly 1:30 in the afternoon and then she will take another Adderall 20 mg.  She has found that this is interfering with her sleep at night and she is interested in making some changes.  Review of Systems     Objective:    Physical Exam Alert and in no distress otherwise not examined       Assessment & Plan:  Attention deficit hyperactivity disorder (ADHD), combined type - Plan: amphetamine -dextroamphetamine  (ADDERALL XR) 30 MG 24 hr capsule After discussion with her, I will have her hold the Vyvanse  and I will give her Adderall 30 mg extended release and she is to keep me informed as to how effective it is and how long it lasts and explained that it will probably last shorter than the Vyvanse  and at that point take the 20 mg of Adderall earlier in the day and see if that is enough to not interfere with her sleep pattern.  She is to leave a message on MyChart in about 2 weeks concerning this.

## 2024-10-17 NOTE — Telephone Encounter (Signed)
 They are saying they cannot fill both.

## 2024-10-17 NOTE — Telephone Encounter (Signed)
 Copied from CRM #[id number]. Topic: Clinical - Prescription Issue >> Oct 17, 2024  2:05 PM Wess RAMAN wrote: Reason for CRM: Pharmacy needs clarification on which medication patient should get filled today. They will not be able to fill both  Medications: amphetamine -dextroamphetamine  (ADDERALL XR) 30 MG 24 hr capsule  lisdexamfetamine (VYVANSE ) 60 MG capsule (Starting on 10/23/2024)  Pharmacy: [facility] - [place], [place] - [address] [place] [address] Phone: [phone] Fax: [phone] Hours: Not open 24 hours

## 2024-10-19 ENCOUNTER — Other Ambulatory Visit: Payer: Self-pay | Admitting: Family Medicine

## 2024-10-24 ENCOUNTER — Encounter: Payer: Self-pay | Admitting: Family Medicine

## 2024-10-29 ENCOUNTER — Telehealth: Payer: Self-pay | Admitting: Family Medicine

## 2024-10-29 DIAGNOSIS — F902 Attention-deficit hyperactivity disorder, combined type: Secondary | ICD-10-CM

## 2024-10-29 NOTE — Telephone Encounter (Unsigned)
 Copied from CRM #[id number]. Topic: Clinical - Medication Refill >> Oct 29, 2024 11:00 AM Rachelle R wrote: Medication: amphetamine -dextroamphetamine  (ADDERALL XR) 30 MG 24 hr capsule  Has the patient contacted their pharmacy? Yes, call dr  This is the patient's preferred pharmacy:  [facility] - [place], [place] - [address] [facility] [address] [place] [address] Phone: [phone] Fax: [phone]  Is this the correct pharmacy for this prescription? Yes If no, delete pharmacy and type the correct one.   Has the prescription been filled recently? No  Is the patient out of the medication? Yes  Has the patient been seen for an appointment in the last year OR does the patient have an upcoming appointment? Yes  Can we respond through MyChart? Yes  Agent: Please be advised that Rx refills may take up to 3 business days. We ask that you follow-up with your pharmacy.

## 2024-10-30 ENCOUNTER — Other Ambulatory Visit: Payer: Self-pay | Admitting: Family Medicine

## 2024-10-30 ENCOUNTER — Telehealth: Payer: Self-pay

## 2024-10-30 DIAGNOSIS — F902 Attention-deficit hyperactivity disorder, combined type: Secondary | ICD-10-CM

## 2024-10-30 MED ORDER — AMPHETAMINE-DEXTROAMPHET ER 30 MG PO CP24: 30.0000 mg | ORAL_CAPSULE | ORAL | 0 refills | Status: DC

## 2024-10-30 NOTE — Progress Notes (Signed)
 The Adderall XR 30 and 20 mg of regular Adderall allows her to have a better sleep pattern.

## 2024-10-30 NOTE — Telephone Encounter (Signed)
 Copied from CRM #[id number]. Topic: Clinical - Medication Refill >> Oct 29, 2024 11:00 AM Rachelle R wrote: Medication: amphetamine -dextroamphetamine  (ADDERALL XR) 30 MG 24 hr capsule  Has the patient contacted their pharmacy? Yes, call dr  This is the patient's preferred pharmacy:  [facility] - [place], [place] - [address] [facility] [address] [place] [address] Phone: [phone] Fax: [phone]  Is this the correct pharmacy for this prescription? Yes If no, delete pharmacy and type the correct one.   Has the prescription been filled recently? No  Is the patient out of the medication? Yes  Has the patient been seen for an appointment in the last year OR does the patient have an upcoming appointment? Yes  Can we respond through MyChart? Yes  Agent: Please be advised that Rx refills may take up to 3 business days. We ask that you follow-up with your pharmacy. >> Oct 30, 2024  1:51 PM Antwanette L wrote: Patient is calling to get an status update on her amphetamine -dextroamphetamine  (ADDERALL XR) 30 MG 24 hr capsule. Informed the patient, the requesting was submitted yesterday and it can take up to 3 business days.

## 2024-10-30 NOTE — Telephone Encounter (Signed)
 filled

## 2024-11-06 ENCOUNTER — Other Ambulatory Visit: Payer: Self-pay | Admitting: Family Medicine

## 2024-11-06 DIAGNOSIS — F902 Attention-deficit hyperactivity disorder, combined type: Secondary | ICD-10-CM

## 2024-11-06 NOTE — Telephone Encounter (Signed)
 Copied from CRM #[id number]. Topic: Clinical - Medication Refill >> Nov 06, 2024  9:53 AM Berwyn MATSU wrote: Medication: amphetamine -dextroamphetamine  (ADDERALL) 20 MG tablet   Has the patient contacted their pharmacy? Yes (Agent: If no, request that the patient contact the pharmacy for the refill. If patient does not wish to contact the pharmacy document the reason why and proceed with request.) (Agent: If yes, when and what did the pharmacy advise?)  This is the patient's preferred pharmacy:  [facility] - [place], [place] - [address] [facility] [address] [place] [address] Phone: [phone] Fax: [phone]    Is this the correct pharmacy for this prescription? Yes If no, delete pharmacy and type the correct one.   Has the prescription been filled recently? Yes  Is the patient out of the medication? Yes  Has the patient been seen for an appointment in the last year OR does the patient have an upcoming appointment? Yes  Can we respond through MyChart? Yes  Agent: Please be advised that Rx refills may take up to 3 business days. We ask that you follow-up with your pharmacy.

## 2024-11-07 ENCOUNTER — Encounter: Payer: Self-pay | Admitting: Family Medicine

## 2024-11-07 ENCOUNTER — Other Ambulatory Visit: Payer: Self-pay | Admitting: Family Medicine

## 2024-11-07 MED ORDER — AMPHETAMINE-DEXTROAMPHETAMINE 20 MG PO TABS: 20.0000 mg | ORAL_TABLET | Freq: Every day | ORAL | 0 refills | Status: DC

## 2024-11-23 ENCOUNTER — Other Ambulatory Visit: Payer: Self-pay | Admitting: Family Medicine

## 2024-11-23 DIAGNOSIS — F902 Attention-deficit hyperactivity disorder, combined type: Secondary | ICD-10-CM

## 2024-11-23 NOTE — Telephone Encounter (Signed)
 Copied from CRM [id number]. Topic: Clinical - Medication Refill >> Nov 23, 2024  4:08 PM Everette C wrote: Medication: amphetamine -dextroamphetamine  (ADDERALL XR) 30 MG 24 hr capsule [[id number]]  Has the patient contacted their pharmacy? Yes (Agent: If no, request that the patient contact the pharmacy for the refill. If patient does not wish to contact the pharmacy document the reason why and proceed with request.) (Agent: If yes, when and what did the pharmacy advise?)  This is the patient's preferred pharmacy:  [facility] - [place], [place] - [address] [facility] [address] [place] [address] Phone: [phone] Fax: [phone]  Is this the correct pharmacy for this prescription? Yes If no, delete pharmacy and type the correct one.   Has the prescription been filled recently? Yes  Is the patient out of the medication? Yes  Has the patient been seen for an appointment in the last year OR does the patient have an upcoming appointment? Yes  Can we respond through MyChart? No  Agent: Please be advised that Rx refills may take up to 3 business days. We ask that you follow-up with your pharmacy.

## 2024-11-26 DIAGNOSIS — F902 Attention-deficit hyperactivity disorder, combined type: Secondary | ICD-10-CM

## 2024-11-26 NOTE — Telephone Encounter (Signed)
 Copied from CRM [id number]. Topic: Clinical - Medication Refill >> Nov 26, 2024 12:51 PM Hadassah PARAS wrote: Medication: amphetamine -dextroamphetamine  (ADDERALL XR) 30 MG 24 hr capsule   Has the patient contacted their pharmacy? No (Agent: If no, request that the patient contact the pharmacy for the refill. If patient does not wish to contact the pharmacy document the reason why and proceed with request.) (Agent: If yes, when and what did the pharmacy advise?)  This is the patient's preferred pharmacy:  [facility] - [place], [place] - [address] [facility] [address] [place] [address] Phone: [phone] Fax: [phone]    Is this the correct pharmacy for this prescription? Yes If no, delete pharmacy and type the correct one.   Has the prescription been filled recently? Yes  Is the patient out of the medication? Yes  Has the patient been seen for an appointment in the last year OR does the patient have an upcoming appointment? Yes  Can we respond through MyChart? Yes  Agent: Please be advised that Rx refills may take up to 3 business days. We ask that you follow-up with your pharmacy.

## 2024-11-27 ENCOUNTER — Encounter: Payer: Self-pay | Admitting: Family Medicine

## 2024-11-27 DIAGNOSIS — F902 Attention-deficit hyperactivity disorder, combined type: Secondary | ICD-10-CM

## 2024-11-27 MED ORDER — AMPHETAMINE-DEXTROAMPHET ER 30 MG PO CP24: 30.0000 mg | ORAL_CAPSULE | ORAL | 0 refills | Status: DC

## 2024-11-27 NOTE — Telephone Encounter (Signed)
 Patient would like to request expedited because she is out.    Copied from CRM #[id number]. Topic: Clinical - Medication Refill >> Nov 27, 2024  9:24 AM Tiffany B wrote: Medication: amphetamine -dextroamphetamine  (ADDERALL XR) 30 MG 24 hr capsule Medication     Has the patient contacted their pharmacy? No, Will call pharmacy   This is the patient's preferred pharmacy:  [facility] - [place], [place] - [address] [facility] [address] [place] [address] Phone: [phone] Fax: [phone]  Is this the correct pharmacy for this prescription? Yes If no, delete pharmacy and type the correct one.   Has the prescription been filled recently? Yes  Is the patient out of the medication? Yes   Has the patient been seen for an appointment in the last year OR does the patient have an upcoming appointment? Yes  Can we respond through MyChart? Yes  Agent: Please be advised that Rx refills may take up to 3 business days. We ask that you follow-up with your pharmacy.

## 2024-11-28 MED ORDER — AMPHETAMINE-DEXTROAMPHET ER 30 MG PO CP24: 30.0000 mg | ORAL_CAPSULE | ORAL | 0 refills | Status: AC

## 2024-12-05 ENCOUNTER — Other Ambulatory Visit: Payer: Self-pay | Admitting: Family Medicine

## 2024-12-05 NOTE — Telephone Encounter (Signed)
 Copied from CRM [id number]. Topic: Clinical - Medication Refill >> Dec 05, 2024  3:26 PM Selinda RAMAN wrote: Medication: amphetamine -dextroamphetamine  (ADDERALL) 20 MG tablet  Has the patient contacted their pharmacy? No   This is the patient's preferred pharmacy:  [facility] - [place], [place] - [address] [place] [address] Phone: [phone] Fax: [phone]    Is this the correct pharmacy for this prescription? Yes If no, delete pharmacy and type the correct one.   Has the prescription been filled recently? No  Is the patient out of the medication? No  Has the patient been seen for an appointment in the last year OR does the patient have an upcoming appointment? Yes  Can we respond through MyChart? Yes  Please assist patient further

## 2024-12-06 MED ORDER — AMPHETAMINE-DEXTROAMPHETAMINE 20 MG PO TABS: 20.0000 mg | ORAL_TABLET | Freq: Every day | ORAL | 0 refills | Status: AC

## 2024-12-19 ENCOUNTER — Telehealth: Payer: Self-pay | Admitting: Internal Medicine

## 2024-12-19 NOTE — Telephone Encounter (Signed)
 Copied from CRM #[id number]. Topic: Clinical - Medication Question >> Dec 19, 2024  1:19 PM [name] wrote: Reason for CRM: Patient called in wanting to know if she can switch back to Vyvanse  from  amphetamine -dextroamphetamine  (ADDERALL XR) 30 MG 24 hr capsule or going down to 20mg  on Adderall .

## 2024-12-19 NOTE — Telephone Encounter (Signed)
"  Pt has upcoming appt.  "

## 2024-12-20 ENCOUNTER — Telehealth (INDEPENDENT_AMBULATORY_CARE_PROVIDER_SITE_OTHER): Admitting: Family Medicine

## 2024-12-20 ENCOUNTER — Encounter: Payer: Self-pay | Admitting: Family Medicine

## 2024-12-20 VITALS — Ht 63.0 in | Wt 140.0 lb

## 2024-12-20 DIAGNOSIS — F902 Attention-deficit hyperactivity disorder, combined type: Secondary | ICD-10-CM

## 2024-12-20 MED ORDER — AMPHETAMINE-DEXTROAMPHET ER 20 MG PO CP24: 20.0000 mg | ORAL_CAPSULE | ORAL | 0 refills | Status: AC

## 2024-12-20 NOTE — Progress Notes (Signed)
" ° °  Subjective:    Patient ID: [name], female    DOB: [birth year], 44 y.o.   MRN: [id number]  HPI Documentation for virtual audio and video telecommunications through Caregility encounter:  The patient was located at home. 2 patient identifiers used.  The provider was located in the office. The patient did consent to this visit and is aware of possible charges through their insurance for this visit. The other persons participating in this telemedicine service were none. Time spent on call was 5 minutes and in review of previous records >15 minutes total for counseling and coordination of care. This virtual service is not related to other E/M service within previous 7 days.  She would like to possibly lower her dosing of the Adderall during the day. She has noted over the last month or so that she seems to be much more irritable and thinks this could be related to the medication.  She seems to do okay on the 20 mg dosing that she takes later on in the day. Review of Systems     Objective:    Physical Exam Alert and in no distress otherwise not examined       Assessment & Plan:  Attention deficit hyperactivity disorder (ADHD), combined type - Plan: amphetamine -dextroamphetamine  (ADDERALL XR) 20 MG 24 hr capsule Take it is reasonable to try her on a lower dos acid on MyChart to let me know how she is doing in about a month igher dose XR.  She is to send me a message on MyChart in about a month. "

## 2025-05-20 ENCOUNTER — Encounter: Payer: Self-pay | Admitting: Family Medicine
